# Patient Record
Sex: Female | Born: 1950 | Race: Black or African American | Hispanic: No | Smoking: Former smoker
Health system: Southern US, Community
[De-identification: ages and names within clinical notes are randomized; demographics above are authoritative.]

## PROBLEM LIST (undated history)

## (undated) DIAGNOSIS — M1990 Unspecified osteoarthritis, unspecified site: Secondary | ICD-10-CM

## (undated) DIAGNOSIS — K219 Gastro-esophageal reflux disease without esophagitis: Secondary | ICD-10-CM

## (undated) DIAGNOSIS — R42 Dizziness and giddiness: Secondary | ICD-10-CM

## (undated) DIAGNOSIS — F419 Anxiety disorder, unspecified: Secondary | ICD-10-CM

## (undated) DIAGNOSIS — N12 Tubulo-interstitial nephritis, not specified as acute or chronic: Secondary | ICD-10-CM

## (undated) DIAGNOSIS — R011 Cardiac murmur, unspecified: Secondary | ICD-10-CM

## (undated) DIAGNOSIS — Z8489 Family history of other specified conditions: Secondary | ICD-10-CM

## (undated) DIAGNOSIS — I739 Peripheral vascular disease, unspecified: Secondary | ICD-10-CM

## (undated) DIAGNOSIS — I10 Essential (primary) hypertension: Secondary | ICD-10-CM

## (undated) DIAGNOSIS — Z720 Tobacco use: Secondary | ICD-10-CM

## (undated) DIAGNOSIS — M797 Fibromyalgia: Secondary | ICD-10-CM

## (undated) DIAGNOSIS — M791 Myalgia, unspecified site: Secondary | ICD-10-CM

## (undated) DIAGNOSIS — Z9289 Personal history of other medical treatment: Secondary | ICD-10-CM

## (undated) DIAGNOSIS — T8859XA Other complications of anesthesia, initial encounter: Secondary | ICD-10-CM

## (undated) DIAGNOSIS — D649 Anemia, unspecified: Secondary | ICD-10-CM

## (undated) DIAGNOSIS — T4145XA Adverse effect of unspecified anesthetic, initial encounter: Secondary | ICD-10-CM

## (undated) DIAGNOSIS — R7303 Prediabetes: Secondary | ICD-10-CM

## (undated) DIAGNOSIS — I471 Supraventricular tachycardia, unspecified: Secondary | ICD-10-CM

## (undated) DIAGNOSIS — G629 Polyneuropathy, unspecified: Secondary | ICD-10-CM

## (undated) DIAGNOSIS — E785 Hyperlipidemia, unspecified: Secondary | ICD-10-CM

## (undated) DIAGNOSIS — Z8719 Personal history of other diseases of the digestive system: Secondary | ICD-10-CM

## (undated) DIAGNOSIS — A159 Respiratory tuberculosis unspecified: Secondary | ICD-10-CM

## (undated) DIAGNOSIS — C801 Malignant (primary) neoplasm, unspecified: Secondary | ICD-10-CM

## (undated) DIAGNOSIS — K922 Gastrointestinal hemorrhage, unspecified: Secondary | ICD-10-CM

## (undated) HISTORY — PX: OTHER SURGICAL HISTORY: SHX169

## (undated) HISTORY — DX: Hyperlipidemia, unspecified: E78.5

## (undated) HISTORY — PX: COLONOSCOPY: SHX174

## (undated) HISTORY — DX: Essential (primary) hypertension: I10

## (undated) HISTORY — DX: Tobacco use: Z72.0

## (undated) HISTORY — PX: ECTOPIC PREGNANCY SURGERY: SHX613

## (undated) HISTORY — PX: CERVICAL SPINE SURGERY: SHX589

## (undated) HISTORY — DX: Peripheral vascular disease, unspecified: I73.9

---
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
## 1979-09-11 HISTORY — PX: TONSILECTOMY, ADENOIDECTOMY, BILATERAL MYRINGOTOMY AND TUBES: SHX2538

## 1979-09-11 HISTORY — PX: TUBAL LIGATION: SHX77

## 1989-09-10 HISTORY — PX: CERVICAL SPINE SURGERY: SHX589

## 1997-09-10 HISTORY — PX: ABDOMINAL HYSTERECTOMY: SHX81

## 2016-02-21 DIAGNOSIS — R6889 Other general symptoms and signs: Secondary | ICD-10-CM | POA: Diagnosis not present

## 2016-02-21 DIAGNOSIS — I10 Essential (primary) hypertension: Secondary | ICD-10-CM | POA: Diagnosis not present

## 2016-02-21 DIAGNOSIS — Z720 Tobacco use: Secondary | ICD-10-CM | POA: Diagnosis not present

## 2016-02-21 DIAGNOSIS — E538 Deficiency of other specified B group vitamins: Secondary | ICD-10-CM | POA: Diagnosis not present

## 2016-02-21 DIAGNOSIS — E785 Hyperlipidemia, unspecified: Secondary | ICD-10-CM | POA: Diagnosis not present

## 2016-02-21 DIAGNOSIS — D649 Anemia, unspecified: Secondary | ICD-10-CM | POA: Diagnosis not present

## 2016-02-21 DIAGNOSIS — M542 Cervicalgia: Secondary | ICD-10-CM | POA: Diagnosis not present

## 2016-03-08 DIAGNOSIS — R6889 Other general symptoms and signs: Secondary | ICD-10-CM | POA: Diagnosis not present

## 2016-03-08 DIAGNOSIS — I10 Essential (primary) hypertension: Secondary | ICD-10-CM | POA: Diagnosis not present

## 2016-05-04 ENCOUNTER — Encounter: Payer: Commercial Managed Care - HMO | Admitting: Physical Medicine & Rehabilitation

## 2016-06-01 ENCOUNTER — Encounter
Payer: Commercial Managed Care - HMO | Attending: Physical Medicine & Rehabilitation | Admitting: Physical Medicine & Rehabilitation

## 2016-06-04 DIAGNOSIS — I10 Essential (primary) hypertension: Secondary | ICD-10-CM | POA: Diagnosis not present

## 2016-06-04 DIAGNOSIS — Z23 Encounter for immunization: Secondary | ICD-10-CM | POA: Diagnosis not present

## 2016-06-04 DIAGNOSIS — Z1211 Encounter for screening for malignant neoplasm of colon: Secondary | ICD-10-CM | POA: Diagnosis not present

## 2016-06-04 DIAGNOSIS — Z807 Family history of other malignant neoplasms of lymphoid, hematopoietic and related tissues: Secondary | ICD-10-CM | POA: Diagnosis not present

## 2016-06-04 DIAGNOSIS — Z720 Tobacco use: Secondary | ICD-10-CM | POA: Diagnosis not present

## 2016-06-04 DIAGNOSIS — M79671 Pain in right foot: Secondary | ICD-10-CM | POA: Diagnosis not present

## 2016-06-04 DIAGNOSIS — E785 Hyperlipidemia, unspecified: Secondary | ICD-10-CM | POA: Diagnosis not present

## 2016-06-04 DIAGNOSIS — R6889 Other general symptoms and signs: Secondary | ICD-10-CM | POA: Diagnosis not present

## 2016-07-10 DIAGNOSIS — K5730 Diverticulosis of large intestine without perforation or abscess without bleeding: Secondary | ICD-10-CM | POA: Diagnosis not present

## 2016-07-10 DIAGNOSIS — K640 First degree hemorrhoids: Secondary | ICD-10-CM | POA: Diagnosis not present

## 2016-07-10 DIAGNOSIS — Z1211 Encounter for screening for malignant neoplasm of colon: Secondary | ICD-10-CM | POA: Diagnosis not present

## 2016-08-21 DIAGNOSIS — M79606 Pain in leg, unspecified: Secondary | ICD-10-CM | POA: Diagnosis not present

## 2016-08-21 DIAGNOSIS — R208 Other disturbances of skin sensation: Secondary | ICD-10-CM | POA: Diagnosis not present

## 2016-08-21 DIAGNOSIS — R3129 Other microscopic hematuria: Secondary | ICD-10-CM | POA: Diagnosis not present

## 2016-08-21 DIAGNOSIS — E538 Deficiency of other specified B group vitamins: Secondary | ICD-10-CM | POA: Diagnosis not present

## 2016-08-21 DIAGNOSIS — I10 Essential (primary) hypertension: Secondary | ICD-10-CM | POA: Diagnosis not present

## 2016-08-21 DIAGNOSIS — Z720 Tobacco use: Secondary | ICD-10-CM | POA: Diagnosis not present

## 2016-08-23 ENCOUNTER — Other Ambulatory Visit: Payer: Self-pay | Admitting: Family Medicine

## 2016-08-23 DIAGNOSIS — M79605 Pain in left leg: Principal | ICD-10-CM

## 2016-08-23 DIAGNOSIS — M79604 Pain in right leg: Secondary | ICD-10-CM

## 2016-10-04 DIAGNOSIS — R6889 Other general symptoms and signs: Secondary | ICD-10-CM | POA: Diagnosis not present

## 2016-10-04 DIAGNOSIS — R3121 Asymptomatic microscopic hematuria: Secondary | ICD-10-CM | POA: Diagnosis not present

## 2016-10-04 DIAGNOSIS — R350 Frequency of micturition: Secondary | ICD-10-CM | POA: Diagnosis not present

## 2016-10-11 ENCOUNTER — Encounter: Payer: Self-pay | Admitting: Physical Medicine & Rehabilitation

## 2016-10-11 ENCOUNTER — Encounter: Payer: Medicare HMO | Attending: Physical Medicine & Rehabilitation | Admitting: Physical Medicine & Rehabilitation

## 2016-10-11 VITALS — BP 168/86 | HR 100

## 2016-10-11 DIAGNOSIS — R200 Anesthesia of skin: Secondary | ICD-10-CM | POA: Diagnosis not present

## 2016-10-11 DIAGNOSIS — E538 Deficiency of other specified B group vitamins: Secondary | ICD-10-CM | POA: Insufficient documentation

## 2016-10-11 DIAGNOSIS — I10 Essential (primary) hypertension: Secondary | ICD-10-CM

## 2016-10-11 DIAGNOSIS — M25511 Pain in right shoulder: Secondary | ICD-10-CM | POA: Diagnosis not present

## 2016-10-11 DIAGNOSIS — G8929 Other chronic pain: Secondary | ICD-10-CM | POA: Diagnosis not present

## 2016-10-11 DIAGNOSIS — M542 Cervicalgia: Secondary | ICD-10-CM | POA: Diagnosis not present

## 2016-10-11 DIAGNOSIS — F17210 Nicotine dependence, cigarettes, uncomplicated: Secondary | ICD-10-CM | POA: Insufficient documentation

## 2016-10-11 DIAGNOSIS — Z79899 Other long term (current) drug therapy: Secondary | ICD-10-CM

## 2016-10-11 DIAGNOSIS — D649 Anemia, unspecified: Secondary | ICD-10-CM | POA: Insufficient documentation

## 2016-10-11 DIAGNOSIS — M4802 Spinal stenosis, cervical region: Secondary | ICD-10-CM | POA: Insufficient documentation

## 2016-10-11 DIAGNOSIS — M791 Myalgia, unspecified site: Secondary | ICD-10-CM | POA: Insufficient documentation

## 2016-10-11 DIAGNOSIS — R202 Paresthesia of skin: Secondary | ICD-10-CM | POA: Diagnosis not present

## 2016-10-11 DIAGNOSIS — R6889 Other general symptoms and signs: Secondary | ICD-10-CM | POA: Diagnosis not present

## 2016-10-11 DIAGNOSIS — Z5181 Encounter for therapeutic drug level monitoring: Secondary | ICD-10-CM | POA: Diagnosis not present

## 2016-10-11 DIAGNOSIS — G894 Chronic pain syndrome: Secondary | ICD-10-CM | POA: Insufficient documentation

## 2016-10-11 DIAGNOSIS — Z981 Arthrodesis status: Secondary | ICD-10-CM | POA: Insufficient documentation

## 2016-10-11 MED ORDER — MELOXICAM 15 MG PO TABS: 15.0000 mg | ORAL_TABLET | Freq: Every day | ORAL | 1 refills | Status: DC

## 2016-10-11 MED ORDER — BACLOFEN 10 MG PO TABS: 10.0000 mg | ORAL_TABLET | Freq: Three times a day (TID) | ORAL | 1 refills | Status: AC

## 2016-10-11 MED ORDER — GABAPENTIN 100 MG PO CAPS: 100.0000 mg | ORAL_CAPSULE | Freq: Three times a day (TID) | ORAL | 1 refills | Status: DC

## 2016-10-11 NOTE — Addendum Note (Signed)
Addended by: Marland Mcalpine B on: 10/11/2016 12:22 PM   Modules accepted: Orders

## 2016-10-11 NOTE — Progress Notes (Addendum)
Subjective:    Patient ID: Amy Bruce, female    DOB: 1951-04-10, 66 y.o.   MRN: [id number]  HPI 66 y/o female with pmh/psh of  HTN, anemia, B12 deficiency, rheumatic fever, cervical fusion present for right shoulder pain.  Present since fusion 1991, getting worse in last couple weeks.  She fractured her humerus in 2016 and states she was slinged.  No alleviating factors. Everything exacerbates the pain.  Sharp pain.  Radiates to hands.  She has associated numbness.  Ibu does not help. Narco, flexaril helps. Denies falls. Pain limits from doing ADLs. Pt notes she had similar symptoms on left side before first fusion.  Pt relocated 12/2014 from [place], [place] where she was being followed by Pain clinic.    Pain Inventory Average Pain 8 Pain Right Now 8 My pain is burning, tingling and aching  In the last 24 hours, has pain interfered with the following? General activity 7 Relation with others 7 Enjoyment of life 7 What TIME of day is your pain at its worst? all Sleep (in general) Good  Pain is worse with: sitting and standing Pain improves with: medication Relief from Meds: 6  Mobility walk without assistance ability to climb steps?  yes do you drive?  yes  Function retired  Neuro/Psych numbness tingling  Prior Studies x-rays  Physicians involved in your care Primary care dr Orland Mustard   No pertinent family history of neck/shoulder pain. Social History   Social History  . Marital status: Unknown    Spouse name: N/A  . Number of children: N/A  . Years of education: N/A   Social History Main Topics  . Smoking status: Current Some Day Smoker    Packs/day: 0.50    Types: Cigarettes  . Smokeless tobacco: Never Used  . Alcohol use Yes     Comment: 1-2 wk  . Drug use: No  . Sexual activity: Not Asked   Other Topics Concern  . None   Social History Narrative  . None   Past surgical history: Cervical fusion. Past medical history: HTN, anemia, B12  deficiency, rheumatic fever, chronic pain BP (!) 168/86   Pulse 100   SpO2 98%   Opioid Risk Score:   Fall Risk Score:  `1  Depression screen PHQ 2/9  Depression screen PHQ 2/9 10/11/2016  Decreased Interest 0  Down, Depressed, Hopeless 0  PHQ - 2 Score 0  Altered sleeping 0  Tired, decreased energy 0  Change in appetite 0  Feeling bad or failure about yourself  0  Trouble concentrating 0  Moving slowly or fidgety/restless 0  Suicidal thoughts 0  PHQ-9 Score 0  Difficult doing work/chores Not difficult at all   Review of Systems  Constitutional: Negative.   HENT: Negative.   Eyes: Negative.   Respiratory: Negative.   Cardiovascular: Negative.   Gastrointestinal: Negative.   Endocrine: Negative.   Genitourinary: Negative.   Musculoskeletal: Positive for arthralgias, back pain, myalgias and neck pain.  Skin: Negative.   Allergic/Immunologic: Negative.   Neurological: Positive for numbness.  Hematological: Negative.   Psychiatric/Behavioral: Negative.   All other systems reviewed and are negative.     Objective:   Physical Exam Gen: NAD. Vital signs reviewed HENT: Normocephalic, Atraumatic Eyes: EOMI. No discharge.  Cardio: RRR. No JVD. Pulm: B/l clear to auscultation.  Effort normal Abd: Soft, BS+ MSK:  Gait WNL.   TTP along right traps, and right shoulder.    No edema.   ?+empty can test, limited  by pain.  Neg Speed's, Lindon Romp, Scarf Neuro:   Sensation diminished to light touch in hand b/l R>L  Strength  5/5 in all LE myotomes (Right side limited by pain) Skin: Warm and Dry. Intact    Assessment & Plan:  66 y/o female with pmh/psh of  HTN, anemia, B12 deficiency, rheumatic fever, cervical fusion present for right shoulder pain.    1. Chronic right shoulder and neck pain   Xrays reports reviewed from [place], C3, C6 foraminal stenosis and fusion  Pt to obtain additional imaging of shoulder  Labs reviewed  Referral information reviewed  NCCSRS  reviewed  UDS performed  Heat/Cold ineffective  TENS with benefit, will order  Will order Gabapentin 100 TID   Will order Mobic 15mg  daily with food  Trial Baclofen 10 TID  Will consider Cymbalta in future  Will consider Voltaren gel and Lidoderm patch in future  Will consider PT in future with consideration of traction  Will order NCS/EMG   2. Myalgia   Will consider Trigger point injections in future  3. Right hand numbness  See #1  ?CTS  4. HTN  Elevated in office, encouraged follow up with PCP.  Pt states meds recently adjusted

## 2016-10-17 LAB — TOXASSURE SELECT,+ANTIDEPR,UR

## 2016-10-22 ENCOUNTER — Telehealth: Payer: Self-pay

## 2016-10-22 NOTE — Telephone Encounter (Signed)
Called patient back, states called insurance company and they gave her a list of durable medical pharmacy's that will fill the order for the tens unit, but needs a written prescription from the doctor and his last note with the reason for the tens unit faxed to the durable pharmacy "[facility]"  Fax# [phone]

## 2016-10-22 NOTE — Telephone Encounter (Signed)
Patient called with problem with unknown medication, Returned patients call, no answer, left message to call back

## 2016-10-23 NOTE — Telephone Encounter (Signed)
Faxed order and note to supplied fax #

## 2016-11-02 NOTE — Progress Notes (Signed)
Urine drug screen for this encounter is consistent for having no narcotics present.

## 2016-11-08 ENCOUNTER — Encounter: Payer: Medicare HMO | Attending: Physical Medicine & Rehabilitation | Admitting: Physical Medicine & Rehabilitation

## 2016-11-08 ENCOUNTER — Encounter: Payer: Self-pay | Admitting: Physical Medicine & Rehabilitation

## 2016-11-08 VITALS — BP 177/75 | HR 76 | Resp 14

## 2016-11-08 DIAGNOSIS — M4802 Spinal stenosis, cervical region: Secondary | ICD-10-CM | POA: Insufficient documentation

## 2016-11-08 DIAGNOSIS — E538 Deficiency of other specified B group vitamins: Secondary | ICD-10-CM | POA: Diagnosis not present

## 2016-11-08 DIAGNOSIS — I10 Essential (primary) hypertension: Secondary | ICD-10-CM | POA: Diagnosis not present

## 2016-11-08 DIAGNOSIS — F17210 Nicotine dependence, cigarettes, uncomplicated: Secondary | ICD-10-CM | POA: Diagnosis not present

## 2016-11-08 DIAGNOSIS — Z981 Arthrodesis status: Secondary | ICD-10-CM | POA: Insufficient documentation

## 2016-11-08 DIAGNOSIS — M542 Cervicalgia: Secondary | ICD-10-CM | POA: Diagnosis not present

## 2016-11-08 DIAGNOSIS — G8929 Other chronic pain: Secondary | ICD-10-CM | POA: Diagnosis not present

## 2016-11-08 DIAGNOSIS — D649 Anemia, unspecified: Secondary | ICD-10-CM | POA: Diagnosis not present

## 2016-11-08 DIAGNOSIS — M25511 Pain in right shoulder: Secondary | ICD-10-CM | POA: Insufficient documentation

## 2016-11-08 MED ORDER — GABAPENTIN 100 MG PO CAPS: 300.0000 mg | ORAL_CAPSULE | Freq: Three times a day (TID) | ORAL | 1 refills | Status: DC

## 2016-11-08 NOTE — Progress Notes (Signed)
See Media tab  Plan: Gabapentin increased to 300 TID Follow up in 2 weeks

## 2016-11-21 DIAGNOSIS — R635 Abnormal weight gain: Secondary | ICD-10-CM | POA: Diagnosis not present

## 2016-11-21 DIAGNOSIS — I10 Essential (primary) hypertension: Secondary | ICD-10-CM | POA: Diagnosis not present

## 2016-11-21 DIAGNOSIS — Z23 Encounter for immunization: Secondary | ICD-10-CM | POA: Diagnosis not present

## 2016-11-21 DIAGNOSIS — R05 Cough: Secondary | ICD-10-CM | POA: Diagnosis not present

## 2016-11-21 DIAGNOSIS — R7309 Other abnormal glucose: Secondary | ICD-10-CM | POA: Diagnosis not present

## 2016-11-21 DIAGNOSIS — E785 Hyperlipidemia, unspecified: Secondary | ICD-10-CM | POA: Diagnosis not present

## 2016-11-21 DIAGNOSIS — R062 Wheezing: Secondary | ICD-10-CM | POA: Diagnosis not present

## 2016-11-21 DIAGNOSIS — Z0000 Encounter for general adult medical examination without abnormal findings: Secondary | ICD-10-CM | POA: Diagnosis not present

## 2016-11-21 DIAGNOSIS — Z720 Tobacco use: Secondary | ICD-10-CM | POA: Diagnosis not present

## 2016-11-23 ENCOUNTER — Encounter: Payer: Medicare HMO | Admitting: Physical Medicine & Rehabilitation

## 2016-11-23 ENCOUNTER — Other Ambulatory Visit: Payer: Self-pay | Admitting: Family Medicine

## 2016-11-23 DIAGNOSIS — N6324 Unspecified lump in the left breast, lower inner quadrant: Secondary | ICD-10-CM

## 2016-11-30 ENCOUNTER — Other Ambulatory Visit: Payer: Self-pay | Admitting: Physical Medicine & Rehabilitation

## 2016-11-30 ENCOUNTER — Telehealth: Payer: Self-pay | Admitting: Physical Medicine & Rehabilitation

## 2016-11-30 NOTE — Telephone Encounter (Signed)
Patient leftr voice mail she needs her gabapentin refille for 90 days [phone]

## 2016-12-03 ENCOUNTER — Other Ambulatory Visit: Payer: Self-pay

## 2016-12-03 NOTE — Telephone Encounter (Signed)
Patient asking for 90 day supplies of gabapentin and baclofen.Marland KitchenMarland KitchenMarland KitchenMarland Kitchenplease advise

## 2016-12-04 ENCOUNTER — Other Ambulatory Visit: Payer: Self-pay

## 2016-12-04 MED ORDER — GABAPENTIN 100 MG PO CAPS: 300.0000 mg | ORAL_CAPSULE | Freq: Three times a day (TID) | ORAL | 1 refills | Status: DC

## 2016-12-10 NOTE — Telephone Encounter (Signed)
I believe I see her in a couple of days.  I will discuss it with her then.  Thanks.

## 2016-12-12 ENCOUNTER — Encounter: Payer: Medicare HMO | Attending: Physical Medicine & Rehabilitation | Admitting: Physical Medicine & Rehabilitation

## 2016-12-12 ENCOUNTER — Other Ambulatory Visit: Payer: Self-pay | Admitting: Physical Medicine & Rehabilitation

## 2016-12-12 ENCOUNTER — Encounter: Payer: Self-pay | Admitting: Physical Medicine & Rehabilitation

## 2016-12-12 VITALS — BP 158/90 | HR 69 | Wt 155.8 lb

## 2016-12-12 DIAGNOSIS — M542 Cervicalgia: Secondary | ICD-10-CM | POA: Diagnosis not present

## 2016-12-12 DIAGNOSIS — G8929 Other chronic pain: Secondary | ICD-10-CM | POA: Diagnosis not present

## 2016-12-12 DIAGNOSIS — M25511 Pain in right shoulder: Secondary | ICD-10-CM | POA: Diagnosis not present

## 2016-12-12 DIAGNOSIS — G894 Chronic pain syndrome: Secondary | ICD-10-CM

## 2016-12-12 DIAGNOSIS — E538 Deficiency of other specified B group vitamins: Secondary | ICD-10-CM | POA: Insufficient documentation

## 2016-12-12 DIAGNOSIS — M4802 Spinal stenosis, cervical region: Secondary | ICD-10-CM | POA: Insufficient documentation

## 2016-12-12 DIAGNOSIS — R200 Anesthesia of skin: Secondary | ICD-10-CM | POA: Diagnosis not present

## 2016-12-12 DIAGNOSIS — D649 Anemia, unspecified: Secondary | ICD-10-CM | POA: Diagnosis not present

## 2016-12-12 DIAGNOSIS — F17210 Nicotine dependence, cigarettes, uncomplicated: Secondary | ICD-10-CM | POA: Insufficient documentation

## 2016-12-12 DIAGNOSIS — M791 Myalgia, unspecified site: Secondary | ICD-10-CM

## 2016-12-12 DIAGNOSIS — R202 Paresthesia of skin: Secondary | ICD-10-CM

## 2016-12-12 DIAGNOSIS — I10 Essential (primary) hypertension: Secondary | ICD-10-CM | POA: Insufficient documentation

## 2016-12-12 DIAGNOSIS — Z981 Arthrodesis status: Secondary | ICD-10-CM | POA: Insufficient documentation

## 2016-12-12 MED ORDER — BACLOFEN 20 MG PO TABS: 20.0000 mg | ORAL_TABLET | Freq: Three times a day (TID) | ORAL | 1 refills | Status: DC

## 2016-12-12 NOTE — Progress Notes (Signed)
Subjective:    Patient ID: Amy Bruce, female    DOB: 12/24/1950, 66 y.o.   MRN: [id number]  HPI  67 y/o female with pmh/psh ofHTN, anemia, B12 deficiency, rheumatic fever, cervical fusion present for follow up for right shoulder pain.   Initially stated: Present since fusion 1991, getting worse in last couple weeks.  She fractured her humerus in 2016 and states she was slinged.  No alleviating factors. Everything exacerbates the pain.  Sharp pain.  Radiates to hands.  She has associated numbness.  Ibu does not help. Narco, flexaril helps. Denies falls. Pain limits from doing ADLs. Pt notes she had similar symptoms on left side before first fusion. Pt relocated 12/2014 from [place], [place] where she was being followed by Pain clinic.    Last clinic visit 10/11/16. Since that time, pt states further imaging results was sent to our office.  Pt has obtained TENS with benefit.  Gabapentin is helping. Mobic is helping.  Baclofen is not helping. NCS/EMG was performed on last visit showing subacute/chronic C5 radiculopathy.  BP remains elevated in office, she states she is following with PCP.   Pain Inventory Average Pain 8 Pain Right Now 8 My pain is sharp, burning, stabbing, tingling and aching  In the last 24 hours, has pain interfered with the following? General activity 7 Relation with others 0 Enjoyment of life 7 What TIME of day is your pain at its worst? all Sleep (in general) Fair  Pain is worse with: sitting and some activites Pain improves with: medication and TENS Relief from Meds: 5  Mobility walk without assistance how many minutes can you walk? 120 ability to climb steps?  yes do you drive?  yes  Function retired  Neuro/Psych weakness numbness tingling spasms  Prior Studies Any changes since last visit?  no  Physicians involved in your care Primary care dr Orland Mustard   No pertinent family history of neck/shoulder pain. Social History   Social History  .  Marital status: Unknown    Spouse name: N/A  . Number of children: N/A  . Years of education: N/A   Social History Main Topics  . Smoking status: Current Some Day Smoker    Packs/day: 0.50    Types: Cigarettes  . Smokeless tobacco: Never Used  . Alcohol use Yes     Comment: 1-2 wk  . Drug use: No  . Sexual activity: Not Asked   Other Topics Concern  . None   Social History Narrative  . None   Past surgical history: Cervical fusion. Past medical history: HTN, anemia, B12 deficiency, rheumatic fever, chronic pain BP (!) 158/90   Pulse 69   Wt 155 lb 12.8 oz (70.7 kg)   SpO2 95%   Opioid Risk Score:   Fall Risk Score:  `1  Depression screen PHQ 2/9  Depression screen [facility] 2/9 12/12/2016 11/08/2016 10/11/2016  Decreased Interest 0 0 0  Down, Depressed, Hopeless 0 0 0  PHQ - 2 Score 0 0 0  Altered sleeping - - 0  Tired, decreased energy - - 0  Change in appetite - - 0  Feeling bad or failure about yourself  - - 0  Trouble concentrating - - 0  Moving slowly or fidgety/restless - - 0  Suicidal thoughts - - 0  PHQ-9 Score - - 0  Difficult doing work/chores - - Not difficult at all   Review of Systems  Constitutional: Positive for unexpected weight change.  Weight gain in relatively short amount of time  HENT: Negative.   Eyes: Negative.   Respiratory: Negative.   Cardiovascular: Negative.   Gastrointestinal: Negative.   Endocrine: Negative.   Genitourinary: Negative.   Musculoskeletal: Positive for arthralgias, back pain, myalgias and neck pain.  Skin: Negative.   Allergic/Immunologic: Negative.   Neurological: Positive for numbness.  Hematological: Negative.   Psychiatric/Behavioral: Negative.   All other systems reviewed and are negative.     Objective:   Physical Exam Gen: NAD. Vital signs reviewed HENT: Normocephalic, Atraumatic Eyes: EOMI. No discharge.  Cardio: RRR. No JVD. Pulm: B/l clear to auscultation.  Effort normal Abd: Soft, BS+ MSK:  Gait  WNL.   TTP along right traps, and right shoulder.    No edema.   ?+empty can test, limited by pain. Neuro:   Sensation diminished to light touch in hand b/l R>L  Strength  5/5 in all LE myotomes (Right side limited by pain) Skin: Warm and Dry. Intact    Assessment & Plan:  67 y/o female with pmh/psh of  HTN, anemia, B12 deficiency, rheumatic fever, cervical fusion present for follow up of right shoulder pain.    1. Chronic right shoulder and neck pain   Xrays reports reviewed from [facility], C3, C6 foraminal stenosis and fusion  NCS/EMG showing subacute/chronic C5 radic  Pt to bring additional imaging of shoulder  UDS reviewed.  Heat/Cold ineffective  Cont TENS  Will cont Gabapentin 300 TID (increased on last visit, pt with some concern of weight gain)  Cont Mobic 15mg  daily with food  Will increase Baclofen to 20 TID  Will order PT with consideration for traction  Will consider Cymbalta in future  Will consider Voltaren gel and Lidoderm patch in future   2. Myalgia   Will consider Trigger point injections in future  3. Right hand numbness  Possible double crush  See #1  Possible CTS, however improving  Will order brace  4. HTN  Elevated in office again, encouraged follow up with PCP.

## 2016-12-12 NOTE — Telephone Encounter (Signed)
Recieved electronic refill request for baclofen, saw it was just prescribed and in creased to 20mg , is this a trial medication or can it be written for a 90 day supply, please advise

## 2016-12-18 ENCOUNTER — Encounter: Payer: Self-pay | Admitting: Physical Therapy

## 2016-12-18 ENCOUNTER — Ambulatory Visit: Payer: Medicare HMO | Attending: Physical Medicine & Rehabilitation | Admitting: Physical Therapy

## 2016-12-18 DIAGNOSIS — M542 Cervicalgia: Secondary | ICD-10-CM | POA: Diagnosis not present

## 2016-12-18 DIAGNOSIS — M79601 Pain in right arm: Secondary | ICD-10-CM | POA: Diagnosis not present

## 2016-12-18 DIAGNOSIS — M25511 Pain in right shoulder: Secondary | ICD-10-CM | POA: Diagnosis not present

## 2016-12-18 DIAGNOSIS — G8929 Other chronic pain: Secondary | ICD-10-CM | POA: Diagnosis not present

## 2016-12-18 DIAGNOSIS — R6889 Other general symptoms and signs: Secondary | ICD-10-CM | POA: Diagnosis not present

## 2016-12-18 NOTE — Therapy (Signed)
[place], [place], [address] Phone: [phone]   Fax:  [phone]  Physical Therapy Evaluation  Patient Details  Name: Amy Bruce MRN: [id number] Date of Birth: [birth year] Referring Provider: Jamse Arn, MD  Encounter Date: 12/18/2016      PT End of Session - 12/18/16 0853    Visit Number 1   Number of Visits 13   Date for PT Re-Evaluation 02/01/17   Authorization Type [employer] M[employer] KX at visit 15   PT Start Time 0848   PT Stop Time 0938   PT Time Calculation (min) 50 min   Activity Tolerance Patient tolerated treatment well   Behavior During Therapy [facility] for tasks assessed/performed      No past medical history on file.  Past Surgical History:  Procedure Laterality Date  . ABDOMINAL HYSTERECTOMY  1999  . [place]   C5/6 6/7  . Left axillary cyst removal 1989    . TONSILECTOMY, ADENOIDECTOMY, BILATERAL MYRINGOTOMY AND TUBES  1981  . TUBAL LIGATION  1981    There were no vitals filed for this visit.       Subjective Assessment - 12/18/16 0859    Subjective Began having neck/shoulder pain of insidous onset in 2014, reports diagnosis of degenerative disk disease. Reports pain management in [place], [place] told her C3-4 disk is herniated and T6-9 are degenerated and have excruciating pain. Worst part of all of it is the muscle spasms. Occasional HA pain. Reports fall in 2015 which she landed on her R shoulder and fractured her humeral head.    How long can you sit comfortably? unlimited   How long can you stand comfortably? unlimited   How long can you walk comfortably? unlimited   Patient Stated Goals fixing hair/using hair tools, lift R arm to put on deoderant, write using R arm(has to adjust pen/pencil), reduce R upper arm/forearm burning, bowl, crochet (able now for short time), use computer mouse   Currently in Pain? Yes   Pain Score 7    Pain Location Shoulder    Pain Orientation Right   Pain Descriptors / Indicators Burning;Tightness;Discomfort  annoying   Pain Type Chronic pain   Pain Radiating Towards R lateral neck and R lateral arm and dorsal forearm   Pain Frequency Constant   Aggravating Factors  any use of arm   Pain Relieving Factors none            [facility] PT Assessment - 12/18/16 0001      Assessment   Medical Diagnosis cervicalgia, R arm pain, myalgia   Referring Provider Jamse Arn, MD   Hand Dominance Right   Next MD Visit 01/11/17   Prior Therapy no     Precautions   Precautions None   Precaution Comments anxiety/panic attacks     Restrictions   Weight Bearing Restrictions No     Balance Screen   Has the patient fallen in the past 6 months No     [place] residence   Living Arrangements Alone   Additional Comments emergency stairs only     Prior Function   Level of Independence Independent     Cognition   Overall Cognitive Status Within Functional Limits for tasks assessed     Observation/Other Assessments   Focus on Therapeutic Outcomes (FOTO)  48% ability (goal 60%)     Sensation   Additional Comments tingling in bilateral hands and R  lateral upper arm     Posture/Postural Control   Posture Comments flat thoracic spine     ROM / Strength   AROM / PROM / Strength AROM;Strength     AROM   Overall AROM Comments PROM WFL   AROM Assessment Site Shoulder   Right/Left Shoulder Right   Right Shoulder Flexion 70 Degrees   Right Shoulder ABduction 74 Degrees     Strength   Strength Assessment Site Shoulder;Hand   Right/Left Shoulder Right;Left   Right Shoulder Flexion 3-/5  unable to move through avail ROM against gravity   Right Shoulder External Rotation 3+/5   Left Shoulder External Rotation 4+/5   Left Shoulder Horizontal ABduction 5/5   Right/Left hand Right;Left   Right Hand Grip (lbs) 40 40 40   Left Hand Grip (lbs) 40 45 43     Palpation    Palpation comment pt jumped at very light touch to periscapular region                   [facility] Adult PT Treatment/Exercise - 12/18/16 0001      Exercises   Exercises Shoulder     Shoulder Exercises: Standing   Other Standing Exercises scapular retraction for resting postural alignment     Shoulder Exercises: Stretch   Table Stretch -Flexion Limitations bilateral flexion stretch on table     Manual Therapy   Manual therapy comments edu on use of tennis ball at wall                PT Education - 12/18/16 1304    Education provided Yes   Education Details anatomy of condition, POC, HEP, exercise form/rationale, desensitization   Person(s) Educated Patient   Methods Explanation;Demonstration;Tactile cues;Verbal cues;Handout   Comprehension Verbalized understanding;Returned demonstration;Verbal cues required;Tactile cues required;Need further instruction          PT Short Term Goals - 12/18/16 1312      PT SHORT TERM GOAL #1   Title pt will demo at least 10 deg increase in GHJ ROM by 5/4   Baseline see flowsheet   Time 3   Period Weeks   Status New     PT SHORT TERM GOAL #2   Title Pt will be able to hold her arm up to don deoderant without help of L arm   Baseline unable at eval   Time 3   Period Weeks   Status New           PT Long Term Goals - 12/18/16 1314      PT LONG TERM GOAL #1   Title Pt will be able to write appropraitely using R arm without limitation by 5/25   Baseline limited by grip ability as well as positional intolerance at eval   Time 6   Period Weeks   Status New     PT LONG TERM GOAL #2   Title Pt will be able to use her hair tools and 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 5/7 days of the week   Baseline rare that she is able to at eval   Time 6   Period Weeks   Status New     PT LONG TERM GOAL #3   Title average pain <=4/10 as well as 75% reduction in referred pain into R arm   Baseline 7/10 at eval with constant burning/tingling lateral  arm and dorsal forearm   Time 6   Period Weeks   Status New     PT  LONG TERM GOAL #4   Title FOTO to 60% ability to indicate significant improvement in functional ability   Baseline 48% at eval   Time 6   Period Weeks   Status New               Plan - 12/18/16 1305    Clinical Impression Statement pt presents to PT with complaints of R-sided cervical, shoulder and arm pain that is constant. Pt reports C5/6 and 6/7 fusion in the past as well as a fall on her R shoulder a couple of years ago. Pt has good passive movement in GHJ and WFL cervical AROM. Signifiant limitation in GHJ AROM. Pain patterns presented by the patient are consistent with trigger point referral patterns rather than nerve distribution. Pt was unable to tolerate touch for soft tissue work so I gave her a tennis ball to begin providing pressure at home to decrease sensitization. Pt will benefit from skilled PT in order to improve functional use of GHJ and reach long term functional goals.    Rehab Potential Good   PT Frequency 2x / week   PT Duration 6 weeks   PT Treatment/Interventions ADLs/Self Care Home Management;Cryotherapy;Electrical Stimulation;Iontophoresis 4mg /ml Dexamethasone;Functional mobility training;Ultrasound;Moist Heat;Therapeutic activities;Therapeutic exercise;Traction;Neuromuscular re-education;Patient/family education;Passive range of motion;Manual techniques;Dry needling;Taping   PT Next Visit Plan manual cervical traction, UBE, supine wand   PT Home Exercise Plan scapular retraction, table flexion stretch, tennis ball wall for periscap region   Consulted and Agree with Plan of Care Patient      Patient will benefit from skilled therapeutic intervention in order to improve the following deficits and impairments:  Decreased range of motion, Impaired UE functional use, Increased muscle spasms, Decreased activity tolerance, Pain, Improper body mechanics, Impaired flexibility, Decreased strength,  Postural dysfunction  Visit Diagnosis: Cervicalgia - Plan: PT plan of care cert/re-cert  Chronic right shoulder pain - Plan: PT plan of care cert/re-cert  Pain in right arm - Plan: PT plan of care cert/re-cert      G-Codes - 16/10/96 1317    Functional Assessment Tool Used (Outpatient Only) FOTO 48% ability (goal 60%), GHJ AROM, clinical judgement   Functional Limitation Other PT primary   Other PT Primary Current Status (E4540) At least 60 percent but less than 80 percent impaired, limited or restricted   Other PT Primary Goal Status (J8119) At least 20 percent but less than 40 percent impaired, limited or restricted       Problem List Patient Active Problem List   Diagnosis Date Noted  . Chronic pain syndrome 10/11/2016  . Right shoulder pain 10/11/2016  . Neck pain, chronic 10/11/2016  . Numbness and tingling in right hand 10/11/2016  . Myalgia 10/11/2016    Malone Vanblarcom C. Racine Erby PT, DPT 12/18/16 1:20 PM   [facility] [address] [place], [place], [address] Phone: [phone]   Fax:  [phone]  Name: Amy Bruce MRN: [id number] Date of Birth: [birth year]

## 2016-12-18 NOTE — Patient Instructions (Signed)
  Tennis ball at wall

## 2016-12-24 ENCOUNTER — Telehealth: Payer: Self-pay | Admitting: Acute Care

## 2016-12-24 DIAGNOSIS — F17210 Nicotine dependence, cigarettes, uncomplicated: Principal | ICD-10-CM

## 2016-12-25 ENCOUNTER — Ambulatory Visit: Payer: Medicare HMO | Admitting: Physical Therapy

## 2016-12-25 NOTE — Telephone Encounter (Signed)
Will forward to the lung nodule pool 

## 2016-12-28 ENCOUNTER — Ambulatory Visit: Payer: Medicare HMO | Admitting: Physical Therapy

## 2016-12-28 DIAGNOSIS — R6889 Other general symptoms and signs: Secondary | ICD-10-CM | POA: Diagnosis not present

## 2016-12-28 DIAGNOSIS — R200 Anesthesia of skin: Secondary | ICD-10-CM | POA: Diagnosis not present

## 2016-12-28 DIAGNOSIS — R202 Paresthesia of skin: Secondary | ICD-10-CM | POA: Diagnosis not present

## 2017-01-01 ENCOUNTER — Ambulatory Visit: Payer: Medicare HMO | Admitting: Physical Therapy

## 2017-01-01 NOTE — Telephone Encounter (Signed)
Routing to lung cancer screening pool.  

## 2017-01-01 NOTE — Telephone Encounter (Signed)
Pt calling again for the lung screening appoint please advise  Pt can be reache @ [phone].Amy Bruce'

## 2017-01-02 NOTE — Telephone Encounter (Signed)
Spoke with pt and scheduled for [facility] 01/16/17 10:00 CT ordered' Nothing further needed

## 2017-01-02 NOTE — Telephone Encounter (Signed)
LMTC x 1  

## 2017-01-03 ENCOUNTER — Ambulatory Visit: Payer: Medicare HMO | Admitting: Physical Therapy

## 2017-01-07 DIAGNOSIS — Z780 Asymptomatic menopausal state: Secondary | ICD-10-CM | POA: Diagnosis not present

## 2017-01-08 ENCOUNTER — Ambulatory Visit: Payer: Medicare HMO | Attending: Physical Medicine & Rehabilitation | Admitting: Physical Therapy

## 2017-01-08 DIAGNOSIS — M79601 Pain in right arm: Secondary | ICD-10-CM | POA: Insufficient documentation

## 2017-01-08 DIAGNOSIS — M542 Cervicalgia: Secondary | ICD-10-CM | POA: Insufficient documentation

## 2017-01-08 DIAGNOSIS — M25511 Pain in right shoulder: Secondary | ICD-10-CM | POA: Insufficient documentation

## 2017-01-08 DIAGNOSIS — G8929 Other chronic pain: Secondary | ICD-10-CM | POA: Insufficient documentation

## 2017-01-09 ENCOUNTER — Encounter: Payer: Self-pay | Admitting: Physical Medicine & Rehabilitation

## 2017-01-09 ENCOUNTER — Other Ambulatory Visit: Payer: Self-pay | Admitting: Physical Medicine & Rehabilitation

## 2017-01-09 ENCOUNTER — Encounter: Payer: Medicare HMO | Attending: Physical Medicine & Rehabilitation | Admitting: Physical Medicine & Rehabilitation

## 2017-01-09 VITALS — BP 168/83 | HR 74 | Resp 16 | Wt 153.0 lb

## 2017-01-09 DIAGNOSIS — G5601 Carpal tunnel syndrome, right upper limb: Secondary | ICD-10-CM

## 2017-01-09 DIAGNOSIS — E538 Deficiency of other specified B group vitamins: Secondary | ICD-10-CM | POA: Diagnosis not present

## 2017-01-09 DIAGNOSIS — D649 Anemia, unspecified: Secondary | ICD-10-CM | POA: Insufficient documentation

## 2017-01-09 DIAGNOSIS — F17210 Nicotine dependence, cigarettes, uncomplicated: Secondary | ICD-10-CM | POA: Diagnosis not present

## 2017-01-09 DIAGNOSIS — M791 Myalgia, unspecified site: Secondary | ICD-10-CM

## 2017-01-09 DIAGNOSIS — I10 Essential (primary) hypertension: Secondary | ICD-10-CM

## 2017-01-09 DIAGNOSIS — G8929 Other chronic pain: Secondary | ICD-10-CM | POA: Diagnosis not present

## 2017-01-09 DIAGNOSIS — M542 Cervicalgia: Secondary | ICD-10-CM

## 2017-01-09 DIAGNOSIS — M5412 Radiculopathy, cervical region: Secondary | ICD-10-CM | POA: Diagnosis not present

## 2017-01-09 DIAGNOSIS — M4802 Spinal stenosis, cervical region: Secondary | ICD-10-CM | POA: Insufficient documentation

## 2017-01-09 DIAGNOSIS — M25511 Pain in right shoulder: Secondary | ICD-10-CM | POA: Diagnosis not present

## 2017-01-09 DIAGNOSIS — Z981 Arthrodesis status: Secondary | ICD-10-CM | POA: Insufficient documentation

## 2017-01-09 DIAGNOSIS — G894 Chronic pain syndrome: Secondary | ICD-10-CM

## 2017-01-09 MED ORDER — BACLOFEN 20 MG PO TABS: 30.0000 mg | ORAL_TABLET | Freq: Three times a day (TID) | ORAL | 1 refills | Status: DC

## 2017-01-09 MED ORDER — DULOXETINE HCL 30 MG PO CPEP: 30.0000 mg | ORAL_CAPSULE | Freq: Every day | ORAL | 1 refills | Status: DC

## 2017-01-09 NOTE — Progress Notes (Signed)
Subjective:    Patient ID: Amy Bruce, female    DOB: 1950-12-16, 66 y.o.   MRN: [id number]  HPI  66 y/o female with pmh/psh ofHTN, anemia, B12 deficiency, rheumatic fever, cervical fusion present for follow up for right shoulder pain.   Initially stated: Present since fusion 1991, getting worse in last couple weeks.  She fractured her humerus in 2016 and states she was slinged.  No alleviating factors. Everything exacerbates the pain.  Sharp pain.  Radiates to hands.  She has associated numbness.  Ibu does not help. Narco, flexaril helps. Denies falls. Pain limits from doing ADLs. Pt notes she had similar symptoms on left side before first fusion. Pt relocated 12/2014 from [place], [place] where she was being followed by Pain clinic.    Last clinic visit 12/12/16. Since last visit, she was supposed to bring imaging of her shoulder, which she states she forgot.  Pt is unsure if weight gain is associated with Gabapentin. Baclofen increase did not help.  She states she went to PT without much benefit.  She found exercises online is trying to do those at home.  She has significant improvement with brace for CTS. BP remains elevated.     Pain Inventory Average Pain 7 Pain Right Now 7 My pain is burning and tingling  In the last 24 hours, has pain interfered with the following? General activity 6 Relation with others 6 Enjoyment of life 6 What TIME of day is your pain at its worst? all Sleep (in general) Fair  Pain is worse with: sitting and some activites Pain improves with: therapy/exercise, medication and TENS Relief from Meds: 6  Mobility walk without assistance how many minutes can you walk? 10 ability to climb steps?  yes do you drive?  yes transfers alone Do you have any goals in this area?  no  Function retired Do you have any goals in this area?  yes  Neuro/Psych weakness numbness tingling spasms  Prior Studies Any changes since last visit?  no  Physicians  involved in your care Any changes since last visit?  no   No pertinent family history of neck/shoulder pain. Social History   Social History  . Marital status: Unknown    Spouse name: N/A  . Number of children: N/A  . Years of education: N/A   Social History Main Topics  . Smoking status: Current Some Day Smoker    Packs/day: 0.50    Types: Cigarettes  . Smokeless tobacco: Never Used  . Alcohol use Yes     Comment: 1-2 wk  . Drug use: No  . Sexual activity: Not Asked   Other Topics Concern  . None   Social History Narrative  . None   Past surgical history: Cervical fusion. Past medical history: HTN, anemia, B12 deficiency, rheumatic fever, chronic pain BP (!) 168/83 (BP Location: Left Arm, Patient Position: Sitting, Cuff Size: Normal)   Pulse 74   Resp 16   Wt 153 lb (69.4 kg)   SpO2 95%   Opioid Risk Score:   Fall Risk Score:  `1  Depression screen PHQ 2/9  Depression screen [facility] 2/9 12/12/2016 11/08/2016 10/11/2016  Decreased Interest 0 0 0  Down, Depressed, Hopeless 0 0 0  PHQ - 2 Score 0 0 0  Altered sleeping - - 0  Tired, decreased energy - - 0  Change in appetite - - 0  Feeling bad or failure about yourself  - - 0  Trouble concentrating - -  0  Moving slowly or fidgety/restless - - 0  Suicidal thoughts - - 0  PHQ-9 Score - - 0  Difficult doing work/chores - - Not difficult at all   Review of Systems  Constitutional: Positive for unexpected weight change.       Weight gain in relatively short amount of time  HENT: Negative.   Eyes: Negative.   Respiratory: Negative.   Cardiovascular: Negative.   Gastrointestinal: Negative.   Endocrine: Negative.   Genitourinary: Negative.   Musculoskeletal: Positive for arthralgias, back pain, myalgias and neck pain.       Spasms  Skin: Negative.   Allergic/Immunologic: Negative.   Neurological: Positive for weakness and numbness.  Hematological: Negative.   Psychiatric/Behavioral: Negative.   All other systems  reviewed and are negative.     Objective:   Physical Exam Gen: NAD. Vital signs reviewed HENT: Normocephalic, Atraumatic Eyes: EOMI. No discharge.  Cardio: RRR. No JVD. Pulm: B/l clear to auscultation.  Effort normal Abd: Soft, BS+ MSK:  Gait WNL.   +TTO along right traps, and right shoulder.    No edema.  Neuro:   Sensation diminished to light touch in hand b/l R>L  Strength  5/5 in all LE myotomes (Right side limited by pain) Skin: Warm and Dry. Intact    Assessment & Plan:  66 y/o female with pmh/psh of  HTN, anemia, B12 deficiency, rheumatic fever, cervical fusion present for follow up of right shoulder pain.    1. Chronic right shoulder and neck pain   Xrays reports reviewed from [facility], C3, C6 foraminal stenosis and fusion  NCS/EMG showing subacute/chronic C5 radic. Pt to bring additional imaging of shoulder  Heat/Cold ineffective  Cont TENS  Cont Gabapentin 300 TID  Cont Mobic 15mg  daily with food  Will increase Baclofen to 30 TID  Will order PT with consideration for traction - encouraged follow up for specifically traction  Will order Cymbalta 30, consider increase to 60 on next visit  Will consider Voltaren gel and Lidoderm patch in future after imaging of shoulder  Will order shoulder xray   2. Myalgia and muscle spasms  Will consider Trigger point injections in future  See #1  3. Right CTS  Possible double crush  See #1  Cont brace  4. HTN  Elevated in office again, encouraged follow up   Being adjusted by PCP

## 2017-01-11 ENCOUNTER — Ambulatory Visit: Payer: Medicare HMO | Admitting: Physical Therapy

## 2017-01-15 ENCOUNTER — Ambulatory Visit: Payer: Medicare HMO | Admitting: Physical Therapy

## 2017-01-16 ENCOUNTER — Encounter: Payer: Self-pay | Admitting: Acute Care

## 2017-01-16 ENCOUNTER — Ambulatory Visit (INDEPENDENT_AMBULATORY_CARE_PROVIDER_SITE_OTHER)
Admission: RE | Admit: 2017-01-16 | Discharge: 2017-01-16 | Disposition: A | Discharge status: Home / Self Care | Payer: Medicare HMO | Source: Ambulatory Visit | Attending: Acute Care | Admitting: Acute Care

## 2017-01-16 ENCOUNTER — Ambulatory Visit (INDEPENDENT_AMBULATORY_CARE_PROVIDER_SITE_OTHER): Payer: Medicare HMO | Admitting: Acute Care

## 2017-01-16 DIAGNOSIS — F17210 Nicotine dependence, cigarettes, uncomplicated: Principal | ICD-10-CM

## 2017-01-16 DIAGNOSIS — Z87891 Personal history of nicotine dependence: Secondary | ICD-10-CM

## 2017-01-16 NOTE — Progress Notes (Signed)
Shared Decision Making Visit Lung Cancer Screening Program [phone])   Eligibility:  Age 66 y.o.  Pack Years Smoking History Calculation 33-pack-year smoking history (# packs/per year x # years smoked)  Recent History of coughing up blood  no  Unexplained weight loss? no ( >Than 15 pounds within the last 6 months )  Prior History Lung / other cancer no (Diagnosis within the last 5 years already requiring surveillance chest CT Scans).  Smoking Status Current Smoker  Former Smokers: Years since quit:NA  Quit Date: NA  Visit Components:  Discussion included one or more decision making aids. yes  Discussion included risk/benefits of screening. yes  Discussion included potential follow up diagnostic testing for abnormal scans. yes  Discussion included meaning and risk of over diagnosis. yes  Discussion included meaning and risk of False Positives. yes  Discussion included meaning of total radiation exposure. yes  Counseling Included:  Importance of adherence to annual lung cancer LDCT screening. yes  Impact of comorbidities on ability to participate in the program. yes  Ability and willingness to under diagnostic treatment. yes  Smoking Cessation Counseling:  Current Smokers:   Discussed importance of smoking cessation. yes  Information about tobacco cessation classes and interventions provided to patient. yes  Patient provided with "ticket" for LDCT Scan. yes  Symptomatic Patient. no  CounselingNA  Diagnosis Code: Tobacco Use Z72.0  Asymptomatic Patient yes  Counseling (Intermediate counseling: > three minutes counseling) J6967  Former Smokers:   Discussed the importance of maintaining cigarette abstinence. yes  Diagnosis Code: Personal History of Nicotine Dependence. E93.810  Information about tobacco cessation classes and interventions provided to patient. Yes  Patient provided with "ticket" for LDCT Scan. yes  Written Order for Lung Cancer  Screening with LDCT placed in Epic. Yes (CT Chest Lung Cancer Screening Low Dose W/O CM) [id number] Z12.2-Screening of respiratory organs Z87.891-Personal history of nicotine dependence  I have spent 25 minutes of face to face time with Amy Bruce discussing the risks and benefits of lung cancer screening. We viewed a power point together that explained in detail the above noted topics. We paused at intervals to allow for questions to be asked and answered to ensure understanding.We discussed that the single most powerful action that she can take to decrease her risk of developing lung cancer is to quit smoking. We discussed whether or not she is ready to commit to setting a quit date. She is currently not ready to set a quit date. We discussed options for tools to aid in quitting smoking including nicotine replacement therapy, non-nicotine medications, support groups, Quit Smart classes, and behavior modification. We discussed that often times setting smaller, more achievable goals, such as eliminating 1 cigarette a day for a week and then 2 cigarettes a day for a week can be helpful in slowly decreasing the number of cigarettes smoked. This allows for a sense of accomplishment as well as providing a clinical benefit. I gave her the " Be Stronger Than Your Excuses" card with contact information for community resources, classes, free nicotine replacement therapy, and access to mobile apps, text messaging, and on-line smoking cessation help. I have also given her my card and contact information in the event she needs to contact me. We discussed the time and location of the scan, and that either Doroteo Glassman RN or I will call with the results within 24-48 hours of receiving them. I have offered her  a copy of the power point we viewed  as a resource  in the event they need reinforcement of the concepts we discussed today in the office. The patient verbalized understanding of all of  the above and had no further  questions upon leaving the office. They have my contact information in the event they have any further questions.  I spent 4 minutes counseling on smoking cessation and the health risks of continued tobacco abuse.  I explained to the patient that there has been a high incidence of coronary artery disease noted on these exams. I explained that this is a non-gated exam therefore degree or severity cannot be determined. This patient is currently on statin therapy. I have asked the patient to follow-up with their PCP regarding any incidental finding of coronary artery disease and management with diet or medication as their PCP  feels is clinically indicated. The patient verbalized understanding of the above and had no further questions upon completion of the visit.      Amy Spatz, NP 01/16/2017

## 2017-01-18 ENCOUNTER — Ambulatory Visit: Payer: Medicare HMO | Admitting: Physical Therapy

## 2017-01-18 ENCOUNTER — Encounter: Payer: Self-pay | Admitting: Physical Therapy

## 2017-01-18 DIAGNOSIS — M79601 Pain in right arm: Secondary | ICD-10-CM

## 2017-01-18 DIAGNOSIS — G8929 Other chronic pain: Secondary | ICD-10-CM | POA: Diagnosis not present

## 2017-01-18 DIAGNOSIS — M25511 Pain in right shoulder: Secondary | ICD-10-CM

## 2017-01-18 DIAGNOSIS — M542 Cervicalgia: Secondary | ICD-10-CM

## 2017-01-18 NOTE — Therapy (Signed)
[facility] [address] [place], [place], [address] Phone: [phone]   Fax:  [phone]  Physical Therapy Treatment  Patient Details  Name: Amy Bruce MRN: [id number] Date of Birth: 05/03/1951 Referring Provider: Marcello Fennel, MD  Encounter Date: 01/18/2017      PT End of Session - 01/18/17 0934    Visit Number 2   Number of Visits 5   Date for PT Re-Evaluation 03/08/17   Authorization Type [employer] MCR KX at visit 15   PT Start Time 0934   PT Stop Time 1026   PT Time Calculation (min) 52 min   Activity Tolerance Patient tolerated treatment well   Behavior During Therapy [facility] for tasks assessed/performed      History reviewed. No pertinent past medical history.  Past Surgical History:  Procedure Laterality Date  . ABDOMINAL HYSTERECTOMY  1999  . CERVICAL SPINE SURGERY  1991   C5/6 6/7  . Left axillary cyst removal 1989    . TONSILECTOMY, ADENOIDECTOMY, BILATERAL MYRINGOTOMY AND TUBES  1981  . TUBAL LIGATION  1981    There were no vitals filed for this visit.      Subjective Assessment - 01/18/17 0934    Subjective pt reports TTP at post R shoulder that referrs to lateral shoulder. began feeling pain in L shoulder. Occasional HA. Now L hand also feels like it becomes numb and drops objects. Feels that stretches were helpful that were given at initial evaluation and has been doing them.    Patient Stated Goals fixing hair/using hair tools, lift R arm to put on deoderant, write using R arm(has to adjust pen/pencil), reduce R upper arm/forearm burning, bowl, crochet (able now for short time), use computer mouse   Currently in Pain? Yes   Pain Score 7    Pain Location Shoulder   Pain Orientation Right;Left  R>L   Pain Descriptors / Indicators Stabbing;Throbbing;Burning   Pain Frequency Constant   Aggravating Factors  always in pain   Pain Relieving Factors meds            [facility] PT Assessment -  01/18/17 0001      Assessment   Medical Diagnosis cervicalgia, R arm pain, myalgia   Referring Provider Ankit Karis Juba, MD   Hand Dominance Right     Precautions   Precaution Comments anxiety/panic attacks     Observation/Other Assessments   Focus on Therapeutic Outcomes (FOTO)  54% ability     Sensation   Additional Comments pain into R lateral shoulder and across bilat superior shoulders     Palpation   Palpation comment concordant pain to notable trigger point in R upper trap                     [facility] Adult PT Treatment/Exercise - 01/18/17 0001      Modalities   Modalities Moist Heat     Moist Heat Therapy   Number Minutes Moist Heat 15 Minutes  concurrent with education 7 min   Moist Heat Location Cervical;Shoulder     Manual Therapy   Manual therapy comments manual sustained cervical traction                PT Education - 01/18/17 1148    Education provided Yes   Education Details anatomy of condition, anatomical imperfections v functional disabilities, trigger point dry needling, traction, POC, progress toward goals   Person(s) Educated Patient   Methods Explanation;Handout  TDN info  handout   Comprehension Verbalized understanding;Need further instruction          PT Short Term Goals - 01/18/17 6734      PT SHORT TERM GOAL #1   Title pt will demo at least 10 deg increase in GHJ ROM by    Baseline see flowsheet   Status On-going     PT SHORT TERM GOAL #2   Title Pt will be able to hold her arm up to don deoderant without help of L arm   Baseline able   Status Achieved           PT Long Term Goals - 01/18/17 0953      PT LONG TERM GOAL #1   Title Pt will be able to write appropraitely using R arm without limitation by 6/29   Baseline still difficult, can write about 50% of want she wants to   Status Partially Met     PT LONG TERM GOAL #2   Title Pt will be able to use her hair tools and 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 She is very focused on diagnosed herniations and surgical interventions and spoke of them frequently. We discussed how having anatomical imperfections and changes does not necessarily have to affect her function, the point of medical intervention was to decrease pain and improve daily life. Pt feels limited by copay amount but would like to try trigger point dry needling and continue with cervical traction due to decrease in pain. We agreed upon 1/week every other week as her POC. Will continue manual traction rather than mechanical due to fusions. Pt will benefit from skilled PT in order to decrease pain with manual techniques and create an appropriate HEP for maintenance after d/c.    Rehab Potential Good   PT Frequency 1x / week   PT Duration --  every other week   PT Treatment/Interventions ADLs/Self Care Home Management;Cryotherapy;Electrical Stimulation;Iontophoresis '4mg'$ /ml Dexamethasone;Functional mobility training;Ultrasound;Moist Heat;Therapeutic activities;Therapeutic exercise;Traction;Neuromuscular re-education;Patient/family education;Passive range of motion;Manual techniques;Dry needling;Taping   PT Next Visit Plan manual cervical traction, TDN to begin in June   PT Home Exercise Plan scapular retraction, table flexion stretch, tennis ball wall for periscap region   Consulted and Agree with Plan of Care  Patient      Patient will benefit from skilled therapeutic intervention in order to improve the following deficits and impairments:  Decreased range of motion, Impaired UE functional use, Increased muscle spasms, Decreased activity tolerance, Pain, Improper body mechanics, Impaired flexibility, Decreased strength, Postural dysfunction  Visit Diagnosis: Cervicalgia - Plan: PT plan of care cert/re-cert  Chronic right shoulder pain - Plan: PT plan of care cert/re-cert  Pain in right arm - Plan: PT plan of care cert/re-cert  G-Codes - 01/18/17 1155    Functional Assessment Tool Used (Outpatient Only) FOTO 54% ability (goal 60%), clinical judgement   Functional Limitation Other PT primary   Other PT Primary Current Status (H4035) At least 40 percent but less than 60 percent impaired, limited or restricted   Other PT Primary Goal Status (C4818) At least 20 percent but less than 40 percent impaired, limited or restricted      Problem List Patient Active Problem List   Diagnosis Date Noted  . Chronic pain syndrome 10/11/2016  . Right shoulder pain 10/11/2016  . Neck pain, chronic 10/11/2016  . Numbness and tingling in right hand 10/11/2016  . Myalgia 10/11/2016    Sharnika Binney C. Davius Goudeau PT, DPT 01/18/17 11:59 AM   [place] [facility] [address] [place], [place], [address] Phone: [phone]   Fax:  [phone]  Name: Amy Bruce MRN: [id number] Date of Birth: 18-Nov-1950

## 2017-01-22 ENCOUNTER — Ambulatory Visit: Payer: Medicare HMO | Admitting: Physical Therapy

## 2017-01-24 ENCOUNTER — Telehealth: Payer: Self-pay

## 2017-01-24 NOTE — Telephone Encounter (Signed)
I'm not sure what medication she is referring to, but I do not think it is the Duloxetine causing her increase in BP, unless her BP is even higher.  On review, her SBPs were 160-170 prior to medication initiation.

## 2017-01-24 NOTE — Telephone Encounter (Signed)
Patient called and reported that she has stopped taking duloxetine due to severe side effects that included swelling, increased blood pressure, lack of coordination and other problems, has also requested that she can be placed back onto the medications that she was on before coming to this clinic that didn't increase her blood pressure

## 2017-01-25 ENCOUNTER — Ambulatory Visit: Payer: Medicare HMO | Admitting: Physical Therapy

## 2017-01-25 DIAGNOSIS — Z720 Tobacco use: Secondary | ICD-10-CM | POA: Diagnosis not present

## 2017-01-25 DIAGNOSIS — R3129 Other microscopic hematuria: Secondary | ICD-10-CM | POA: Diagnosis not present

## 2017-01-25 DIAGNOSIS — I10 Essential (primary) hypertension: Secondary | ICD-10-CM | POA: Diagnosis not present

## 2017-01-28 ENCOUNTER — Other Ambulatory Visit: Payer: Self-pay | Admitting: Acute Care

## 2017-01-28 DIAGNOSIS — F17210 Nicotine dependence, cigarettes, uncomplicated: Principal | ICD-10-CM

## 2017-01-28 NOTE — Telephone Encounter (Signed)
Thanks

## 2017-01-28 NOTE — Telephone Encounter (Signed)
She is doing better off the cymbalta and has seen her pcp who has switched her to lisinopril/hctz and her bp is within a normal range now. ( It had gotten up to 226/95). She has appt 02/07/17 and can discuss more then.

## 2017-01-29 ENCOUNTER — Ambulatory Visit: Payer: Medicare HMO | Admitting: Physical Therapy

## 2017-02-01 ENCOUNTER — Encounter: Payer: Self-pay | Admitting: Physical Therapy

## 2017-02-01 ENCOUNTER — Ambulatory Visit: Payer: Medicare HMO | Admitting: Physical Therapy

## 2017-02-01 DIAGNOSIS — M542 Cervicalgia: Secondary | ICD-10-CM | POA: Diagnosis not present

## 2017-02-01 DIAGNOSIS — G8929 Other chronic pain: Secondary | ICD-10-CM | POA: Diagnosis not present

## 2017-02-01 DIAGNOSIS — M25511 Pain in right shoulder: Secondary | ICD-10-CM | POA: Diagnosis not present

## 2017-02-01 DIAGNOSIS — M79601 Pain in right arm: Secondary | ICD-10-CM | POA: Diagnosis not present

## 2017-02-01 NOTE — Therapy (Signed)
Amy Bruce, [place], [address] Phone: [phone]   Fax:  [phone]  Physical Therapy Treatment  Patient Details  Name: Amy Bruce MRN: [id number] Date of Birth: [birth year] Referring Provider: Jamse Arn, MD  Encounter Date: 02/01/2017      PT End of Session - 02/01/17 0933    Visit Number 3   Number of Visits 5   Date for PT Re-Evaluation 03/08/17   Authorization Type [employer] MCR KX at visit 15   PT Start Time 0934   PT Stop Time 1016   PT Time Calculation (min) 42 min   Activity Tolerance Patient tolerated treatment well   Behavior During Therapy [facility] for tasks assessed/performed      History reviewed. No pertinent past medical history.  Past Surgical History:  Procedure Laterality Date  . ABDOMINAL HYSTERECTOMY  1999  . [place]   C5/6 6/7  . Left axillary cyst removal 1989    . TONSILECTOMY, ADENOIDECTOMY, BILATERAL MYRINGOTOMY AND TUBES  1981  . TUBAL LIGATION  1981    There were no vitals filed for this visit.      Subjective Assessment - 02/01/17 0934    Subjective Pt reports she is feeling pretty good today. "Back, right, shoulder muscle and of course my neck and right outside arm" are in pain. HEP is going wonderful, tennis ball makes all of the nerves tingle but it all settles down.    Patient Stated Goals fixing hair/using hair tools, lift R arm to put on deoderant, write using R arm(has to adjust pen/pencil), reduce R upper arm/forearm burning, bowl, crochet (able now for short time), use computer mouse   Currently in Pain? Yes   Pain Score 7    Pain Location Arm   Pain Orientation Right   Pain Descriptors / Indicators --  hurts   Aggravating Factors  constant pain            [facility] PT Assessment - 02/01/17 0001      AROM   Right Shoulder Flexion 127 Degrees  133 on pulleys   Right Shoulder ABduction 108 Degrees     Strength   Right  Shoulder Flexion 3-/5   Right Shoulder External Rotation 4-/5   Left Shoulder External Rotation 4+/5                     [facility] Adult PT Treatment/Exercise - 02/01/17 0001      Shoulder Exercises: Supine   External Rotation 20 reps   Theraband Level (Shoulder External Rotation) Level 1 (Yellow)   Flexion 10 reps   Shoulder Flexion Weight (lbs) 2   Other Supine Exercises chest press 2lb     Shoulder Exercises: Pulleys   Flexion 3 minutes     Manual Therapy   Manual therapy comments manual sustained cervical traction                PT Education - 02/01/17 0938    Education provided Yes   Education Details exercise form/rationale, desensitization of arm, training strength of arm regardless of sensation changes,    Person(s) Educated Patient   Methods Explanation;Demonstration;Tactile cues;Verbal cues   Comprehension Verbalized understanding;Returned demonstration;Verbal cues required;Tactile cues required;Need further instruction          PT Short Term Goals - 01/18/17 6599      PT SHORT TERM GOAL #1   Title pt will demo at least 10 deg increase  in GHJ ROM by    Baseline see flowsheet   Status On-going     PT SHORT TERM GOAL #2   Title Pt will be able to hold her arm up to don deoderant without help of L arm   Baseline able   Status Achieved           PT Long Term Goals - 01/18/17 0953      PT LONG TERM GOAL #1   Title Pt will be able to write appropraitely using R arm without limitation by 6/29   Baseline still difficult, can write about 50% of want she wants to   Status Partially Met     PT LONG TERM GOAL #2   Title Pt will be able to use her hair tools and 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 Focused on activation of postural extensors and scapular retraction for functional endurance. Pt reported feeling "relaxed" after treatment today. Discussed importance of decreasing hypersensitivity with desensitization techniques as sensory changes occur. Significant improvement in available AROM noted today.    PT Next Visit Plan TPDN   PT Home Exercise Plan scapular retraction, table flexion stretch, tennis ball wall for periscap region; row, supine flexion, supine chest press, supine resisted ext rot   Consulted and Agree with Plan of Care Patient      Patient will benefit from skilled therapeutic intervention in order to improve the following deficits and impairments:     Visit Diagnosis: Cervicalgia  Chronic right shoulder pain  Pain in right arm     Problem List Patient Active Problem List   Diagnosis Date Noted  . Chronic pain syndrome 10/11/2016  . Right shoulder pain 10/11/2016  . Neck pain, chronic 10/11/2016  . Numbness and tingling in right hand 10/11/2016  . Myalgia 10/11/2016    Jessica C. Hightower PT, DPT 02/01/17 10:22 AM   [facility] [address] [place], [place], [address] Phone: [phone]   Fax:  [phone]  Name: Amy Bruce MRN: [id number] Date of Birth: 07-13-1951

## 2017-02-03 ENCOUNTER — Other Ambulatory Visit: Payer: Self-pay | Admitting: Physical Medicine & Rehabilitation

## 2017-02-07 ENCOUNTER — Encounter: Payer: Self-pay | Admitting: Physical Medicine & Rehabilitation

## 2017-02-07 ENCOUNTER — Other Ambulatory Visit: Payer: Self-pay | Admitting: Physical Medicine & Rehabilitation

## 2017-02-07 ENCOUNTER — Encounter (HOSPITAL_BASED_OUTPATIENT_CLINIC_OR_DEPARTMENT_OTHER): Payer: Medicare HMO | Admitting: Physical Medicine & Rehabilitation

## 2017-02-07 VITALS — BP 144/81 | HR 74 | Ht 60.0 in | Wt 147.6 lb

## 2017-02-07 DIAGNOSIS — F17210 Nicotine dependence, cigarettes, uncomplicated: Secondary | ICD-10-CM | POA: Diagnosis not present

## 2017-02-07 DIAGNOSIS — I10 Essential (primary) hypertension: Secondary | ICD-10-CM | POA: Diagnosis not present

## 2017-02-07 DIAGNOSIS — M791 Myalgia, unspecified site: Secondary | ICD-10-CM

## 2017-02-07 DIAGNOSIS — M5412 Radiculopathy, cervical region: Secondary | ICD-10-CM

## 2017-02-07 DIAGNOSIS — R202 Paresthesia of skin: Secondary | ICD-10-CM | POA: Diagnosis not present

## 2017-02-07 DIAGNOSIS — R200 Anesthesia of skin: Secondary | ICD-10-CM | POA: Diagnosis not present

## 2017-02-07 DIAGNOSIS — G894 Chronic pain syndrome: Secondary | ICD-10-CM

## 2017-02-07 DIAGNOSIS — G8929 Other chronic pain: Secondary | ICD-10-CM | POA: Diagnosis not present

## 2017-02-07 DIAGNOSIS — G5601 Carpal tunnel syndrome, right upper limb: Secondary | ICD-10-CM

## 2017-02-07 DIAGNOSIS — M542 Cervicalgia: Secondary | ICD-10-CM

## 2017-02-07 DIAGNOSIS — M25511 Pain in right shoulder: Secondary | ICD-10-CM

## 2017-02-07 DIAGNOSIS — Z720 Tobacco use: Secondary | ICD-10-CM | POA: Diagnosis not present

## 2017-02-07 DIAGNOSIS — Z981 Arthrodesis status: Secondary | ICD-10-CM | POA: Diagnosis not present

## 2017-02-07 DIAGNOSIS — E538 Deficiency of other specified B group vitamins: Secondary | ICD-10-CM | POA: Diagnosis not present

## 2017-02-07 DIAGNOSIS — D649 Anemia, unspecified: Secondary | ICD-10-CM | POA: Diagnosis not present

## 2017-02-07 DIAGNOSIS — M4802 Spinal stenosis, cervical region: Secondary | ICD-10-CM | POA: Diagnosis not present

## 2017-02-07 MED ORDER — GABAPENTIN 600 MG PO TABS: 600.0000 mg | ORAL_TABLET | Freq: Three times a day (TID) | ORAL | 1 refills | Status: DC

## 2017-02-07 MED ORDER — DICLOFENAC SODIUM 50 MG PO TBEC: 50.0000 mg | DELAYED_RELEASE_TABLET | Freq: Three times a day (TID) | ORAL | 1 refills | Status: DC

## 2017-02-07 MED ORDER — CYCLOBENZAPRINE HCL 10 MG PO TABS: 10.0000 mg | ORAL_TABLET | Freq: Three times a day (TID) | ORAL | 1 refills | Status: DC | PRN

## 2017-02-07 NOTE — Progress Notes (Signed)
Subjective:    Patient ID: Amy Bruce, female    DOB: [birth year], 66 y.o.   MRN: [id number]  HPI  66 y/o female with pmh/psh ofHTN, anemia, B12 deficiency, rheumatic fever, cervical fusion present for follow up for right shoulder pain.   Initially stated: Present since fusion 1991, getting worse in last couple weeks.  She fractured her humerus in 2016 and states she was slinged.  No alleviating factors. Everything exacerbates the pain.  Sharp pain.  Radiates to hands.  She has associated numbness.  Ibu does not help. Narco, flexaril helps. Denies falls. Pain limits from doing ADLs. Pt notes she had similar symptoms on left side before first fusion. Pt relocated 12/2014 from [place], [place] where she was being followed by Pain clinic.    Last clinic visit 01/09/17. This AM, she states she is not feeling well with nausea/vomitting this AM with dizziness.  Since last visit, she continues to take medications as prescribed.  She does not notice much improvement with Mobic and Baclofen. Pt is in PT, she tried traction device with benefit.  She stopped taking Cymbalta due to side effects. She never obtained xray of shoulder.  Her BP has improved.    Pain Inventory Average Pain 7 Pain Right Now 7 My pain is burning and tingling  In the last 24 hours, has pain interfered with the following? General activity 6 Relation with others 6 Enjoyment of life 6 What TIME of day is your pain at its worst? all Sleep (in general) Fair  Pain is worse with: sitting and some activites Pain improves with: therapy/exercise, medication and TENS Relief from Meds: 6  Mobility walk without assistance how many minutes can you walk? 10 ability to climb steps?  yes do you drive?  yes transfers alone Do you have any goals in this area?  no  Function retired Do you have any goals in this area?  yes  Neuro/Psych weakness numbness tingling spasms  Prior Studies Any changes since last visit?   no  Physicians involved in your care Any changes since last visit?  no   No pertinent family history of neck/shoulder pain. Social History   Social History  . Marital status: Unknown    Spouse name: N/A  . Number of children: N/A  . Years of education: N/A   Social History Main Topics  . Smoking status: Current Some Day Smoker    Packs/day: 1.00    Years: 33.00    Types: Cigarettes  . Smokeless tobacco: Never Used  . Alcohol use Yes     Comment: 1-2 wk  . Drug use: No  . Sexual activity: Not Asked   Other Topics Concern  . None   Social History Narrative  . None   Past surgical history: Cervical fusion. Past medical history: HTN, anemia, B12 deficiency, rheumatic fever, chronic pain BP (!) 144/81   Pulse 74   Ht 5' (1.524 m)   Wt 147 lb 9.6 oz (67 kg)   SpO2 96%   BMI 28.83 kg/m   Opioid Risk Score:   Fall Risk Score:  `1  Depression screen PHQ 2/9  Depression screen [facility] 2/9 12/12/2016 11/08/2016 10/11/2016  Decreased Interest 0 0 0  Down, Depressed, Hopeless 0 0 0  PHQ - 2 Score 0 0 0  Altered sleeping - - 0  Tired, decreased energy - - 0  Change in appetite - - 0  Feeling bad or failure about yourself  - - 0  Trouble concentrating - - 0  Moving slowly or fidgety/restless - - 0  Suicidal thoughts - - 0  PHQ-9 Score - - 0  Difficult doing work/chores - - Not difficult at all   Review of Systems  Constitutional: Positive for unexpected weight change.       Weight gain in relatively short amount of time  HENT: Negative.   Eyes: Negative.   Respiratory: Negative.   Cardiovascular: Negative.   Gastrointestinal: Positive for nausea and vomiting.  Endocrine: Negative.   Genitourinary: Negative.   Musculoskeletal: Positive for arthralgias, back pain, myalgias and neck pain.       Spasms  Skin: Negative.   Allergic/Immunologic: Negative.   Neurological: Positive for dizziness, weakness, light-headedness and numbness.  Hematological: Negative.    Psychiatric/Behavioral: Negative.   All other systems reviewed and are negative.     Objective:   Physical Exam Gen: NAD. Vital signs reviewed HENT: Normocephalic, Atraumatic Eyes: EOMI. No discharge.  Cardio: RRR. No JVD. Pulm: B/l clear to auscultation.  Effort normal Abd: Soft, BS+ MSK:  Gait WNL.   +TTP along right traps, and right shoulder.    No edema.  Neuro:   Sensation diminished to light touch in hand b/l R>L  Strength  5/5 in all LE myotomes (Right side limited by pain) Skin: Warm and Dry. Intact    Assessment & Plan:  66 y/o female with pmh/psh of  HTN, anemia, B12 deficiency, rheumatic fever, cervical fusion present for follow up of right shoulder pain.    1. Chronic right shoulder and neck pain   Xrays reports reviewed from [facility], C3, C6 foraminal stenosis and fusion  NCS/EMG showing subacute/chronic C5 radic. Pt to bring additional imaging of shoulder  Heat/Cold ineffective  Unable to tolerate Cymbalta  Cont TENS  Gabapentin increased to 600 TID  Will d/c Mobic 15mg  daily with food due to efficacy.  Trial diclofenac 50 TID.  Will d/c Baclofen to 30 TID. Will order Flexaril 10 TID - pt states this has worked for her in the past.   Cont PT with traction  Will consider Voltaren gel and Lidoderm patch in future after imaging of shoulder, await imaging  Ordered shoulder xray, pt to follow up   2. Myalgia and muscle spasms  Will consider Trigger point injections in future  See #1  3. Right CTS  Possible double crush  See #1  Cont brace  4. HTN  Sill elevated, but improved  Being adjusted by PCP   5. Tobacco abuse  Smoking 1/2 PPD  Encouraged weaning  6. Likely Viral GI illness  Encouraged follow up with PCP

## 2017-02-07 NOTE — Telephone Encounter (Signed)
Patient seen today.  We have received electronic request to change diclofenac 50 mg tabs to a 90 day supply.  Please advise or resubmit order

## 2017-02-09 ENCOUNTER — Other Ambulatory Visit: Payer: Self-pay | Admitting: Physical Medicine & Rehabilitation

## 2017-02-15 ENCOUNTER — Encounter: Payer: Self-pay | Admitting: Physical Therapy

## 2017-02-15 ENCOUNTER — Ambulatory Visit: Payer: Medicare HMO | Attending: Physical Medicine & Rehabilitation | Admitting: Physical Therapy

## 2017-02-15 ENCOUNTER — Telehealth: Payer: Self-pay | Admitting: *Deleted

## 2017-02-15 DIAGNOSIS — M542 Cervicalgia: Secondary | ICD-10-CM | POA: Insufficient documentation

## 2017-02-15 DIAGNOSIS — G8929 Other chronic pain: Secondary | ICD-10-CM | POA: Diagnosis not present

## 2017-02-15 DIAGNOSIS — M25511 Pain in right shoulder: Secondary | ICD-10-CM | POA: Diagnosis not present

## 2017-02-15 DIAGNOSIS — M79601 Pain in right arm: Secondary | ICD-10-CM | POA: Diagnosis not present

## 2017-02-15 NOTE — Therapy (Addendum)
[place], [place], [address] Phone: [phone]   Fax:  [phone]  Physical Therapy Treatment/Discharge Summary  Patient Details  Name: Amy Bruce MRN: [id number] Date of Birth: Nov 01, 1950 Referring Provider: Jamse Arn, MD  Encounter Date: 02/15/2017      PT End of Session - 02/15/17 0933    Visit Number 4   Number of Visits 5   Date for PT Re-Evaluation 03/08/17   Authorization Type [employer] M[employer] KX at visit 15   PT Start Time 0930   PT Stop Time 1013   PT Time Calculation (min) 43 min   Activity Tolerance Patient tolerated treatment well   Behavior During Therapy [facility] for tasks assessed/performed      History reviewed. No pertinent past medical history.  Past Surgical History:  Procedure Laterality Date  . ABDOMINAL HYSTERECTOMY  1999  . [place]   C5/6 6/7  . Left axillary cyst removal 1989    . TONSILECTOMY, ADENOIDECTOMY, BILATERAL MYRINGOTOMY AND TUBES  1981  . TUBAL LIGATION  1981    There were no vitals filed for this visit.      Subjective Assessment - 02/15/17 0933    Subjective Pt reports she is overall feeling better, HEP is helping. Spoke with Dr Posey Pronto and is going to do trigger point injections rather than dry needling. Pt reports a 5/10 but states "I don't feel the pain or the burning"   Patient Stated Goals fixing hair/using hair tools, lift R arm to put on deoderant, write using R arm(has to adjust pen/pencil), reduce R upper arm/forearm burning, bowl, crochet (able now for short time), use computer mouse   Currently in Pain? Yes   Pain Score 5                          [facility] Adult PT Treatment/Exercise - 02/15/17 0001      Shoulder Exercises: Sidelying   External Rotation Weight (lbs) 1   External Rotation Limitations cues for alignment and activation patterns     Shoulder Exercises: ROM/Strengthening   UBE (Upper Arm Bike)  retro 6 min L1                PT Education - 02/15/17 1007    Education provided Yes   Education Details pillows for sleeping posture, what is dry needling & trigger point injection, exercise form/rationale, shifting work from muscles that trigger points are released from, postural endurance   Person(s) Educated Patient   Methods Explanation;Tactile cues;Verbal cues   Comprehension Verbalized understanding;Verbal cues required;Tactile cues required;Need further instruction          PT Short Term Goals - 01/18/17 9924      PT SHORT TERM GOAL #1   Title pt will demo at least 10 deg increase in GHJ ROM by    Baseline see flowsheet   Status On-going     PT SHORT TERM GOAL #2   Title Pt will be able to hold her arm up to don deoderant without help of L arm   Baseline able   Status Achieved           PT Long Term Goals - 02/15/17 0940      PT LONG TERM GOAL #1   Title Pt will be able to write appropraitely using R arm without limitation by 6/29   Baseline denies limitation   Status Achieved  PT LONG TERM GOAL #2   Title Pt will be able to use her hair tools and 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 5/7 days of the week   Baseline 3-4 days/week   Status On-going     PT LONG TERM GOAL #3   Title average pain <=4/10 as well as 75% reduction in referred pain into R arm   Baseline 4/10   Status Achieved     PT LONG TERM GOAL #4   Title FOTO to 60% ability to indicate significant improvement in functional ability   Baseline 54% ability   Status On-going               Plan - 02/15/17 1013    Clinical Impression Statement "I feel great"   Concordant pain in L upper trap trigger points which were released by ischemic compression. Next visit to focus on use of gym equipment for transition to independent exercise. May change her insurance, will call and notify us.    PT Treatment/Interventions ADLs/Self Care Home Management;Cryotherapy;Electrical Stimulation;Iontophoresis '4mg'$ /ml  Dexamethasone;Functional mobility training;Ultrasound;Moist Heat;Therapeutic activities;Therapeutic exercise;Traction;Neuromuscular re-education;Patient/family education;Passive range of motion;Manual techniques;Dry needling;Taping   PT Next Visit Plan gym equipment   PT Home Exercise Plan scapular retraction, table flexion stretch, tennis ball wall for periscap region; row, supine flexion, supine chest press, supine resisted ext rot; sidelying ER & abd   Consulted and Agree with Plan of Care Patient      Patient will benefit from skilled therapeutic intervention in order to improve the following deficits and impairments:  Decreased range of motion, Impaired UE functional use, Increased muscle spasms, Decreased activity tolerance, Pain, Improper body mechanics, Impaired flexibility, Decreased strength, Postural dysfunction  Visit Diagnosis: Cervicalgia  Chronic right shoulder pain  Pain in right arm     Problem List Patient Active Problem List   Diagnosis Date Noted  . Chronic pain syndrome 10/11/2016  . Right shoulder pain 10/11/2016  . Neck pain, chronic 10/11/2016  . Numbness and tingling in right hand 10/11/2016  . Myalgia 10/11/2016    Laurenashley Viar C. Rickey Sadowski PT, DPT 02/15/17 10:18 AM   [facility] [address] [place], [place], [address] Phone: [phone]   Fax:  [phone]  Name: Amy Bruce MRN: [id number] Date of Birth: 04-Mar-1951   PHYSICAL THERAPY DISCHARGE SUMMARY  Visits from Start of Care: 4  Current functional level related to goals / functional outcomes: See above   Remaining deficits: See above   Education / Equipment: Anatomy of condition, POC, HEP, exercise form/rationale  Plan: Patient agrees to discharge.  Patient goals were partially met. Patient is being discharged due to not returning since the last visit.  ?????    Alontae Chaloux C. Sehaj Mcenroe PT, DPT 04/18/17 6:12 PM

## 2017-02-15 NOTE — Telephone Encounter (Signed)
Amy Bruce is calling about her flexeril.  She says that she discussed this with Bruce last week and is waiting to hear.  I do not see the phone call recorded.  Please address.

## 2017-02-18 NOTE — Telephone Encounter (Signed)
Checked both insurance PA for this patient and medication, PA already exists on file through [place] and no PA necessary for medicaid.

## 2017-02-19 NOTE — Telephone Encounter (Signed)
Called patient to inform her on status of medication tier exception and PA status, left message about such, informed her to call back if any questions

## 2017-02-20 NOTE — Telephone Encounter (Signed)
Request for tier exception was denied by [facility], called patient to inform her of the decision.  Patient stated was able to pick up the medication for much less already and stated that medication is working very good

## 2017-02-21 ENCOUNTER — Other Ambulatory Visit: Payer: Self-pay | Admitting: Family Medicine

## 2017-02-21 DIAGNOSIS — Z720 Tobacco use: Secondary | ICD-10-CM | POA: Diagnosis not present

## 2017-02-21 DIAGNOSIS — I10 Essential (primary) hypertension: Secondary | ICD-10-CM | POA: Diagnosis not present

## 2017-02-21 DIAGNOSIS — N6324 Unspecified lump in the left breast, lower inner quadrant: Secondary | ICD-10-CM

## 2017-02-25 ENCOUNTER — Other Ambulatory Visit: Payer: Self-pay | Admitting: Physical Medicine & Rehabilitation

## 2017-02-25 ENCOUNTER — Ambulatory Visit (HOSPITAL_COMMUNITY)
Admission: RE | Admit: 2017-02-25 | Discharge: 2017-02-25 | Disposition: A | Discharge status: Home / Self Care | Payer: Medicare HMO | Source: Ambulatory Visit | Attending: Physical Medicine & Rehabilitation | Admitting: Physical Medicine & Rehabilitation

## 2017-02-25 DIAGNOSIS — G894 Chronic pain syndrome: Secondary | ICD-10-CM

## 2017-02-25 DIAGNOSIS — M25511 Pain in right shoulder: Secondary | ICD-10-CM

## 2017-02-25 DIAGNOSIS — G8929 Other chronic pain: Secondary | ICD-10-CM | POA: Diagnosis present

## 2017-02-25 DIAGNOSIS — M19011 Primary osteoarthritis, right shoulder: Secondary | ICD-10-CM | POA: Diagnosis not present

## 2017-03-01 ENCOUNTER — Ambulatory Visit: Payer: Medicare HMO | Admitting: Physical Therapy

## 2017-03-01 ENCOUNTER — Telehealth: Payer: Self-pay | Admitting: Physical Therapy

## 2017-03-01 NOTE — Telephone Encounter (Signed)
Left message on home number-preferred mobile number did not have VM available. Pt NS for 9:30 apt today and does not have any more scheduled. Requested call back.  Artemisa Sladek C. Beverlyn Mcginness PT, DPT 03/01/17 11:01 AM

## 2017-03-07 ENCOUNTER — Ambulatory Visit
Admission: RE | Admit: 2017-03-07 | Discharge: 2017-03-07 | Disposition: A | Discharge status: Home / Self Care | Payer: Medicare HMO | Source: Ambulatory Visit | Attending: Family Medicine | Admitting: Family Medicine

## 2017-03-07 ENCOUNTER — Encounter: Payer: Self-pay | Admitting: Physical Medicine & Rehabilitation

## 2017-03-07 ENCOUNTER — Encounter: Payer: Medicare HMO | Attending: Physical Medicine & Rehabilitation | Admitting: Physical Medicine & Rehabilitation

## 2017-03-07 VITALS — BP 161/83 | HR 86 | Resp 14

## 2017-03-07 DIAGNOSIS — M791 Myalgia, unspecified site: Secondary | ICD-10-CM

## 2017-03-07 DIAGNOSIS — R200 Anesthesia of skin: Secondary | ICD-10-CM | POA: Diagnosis not present

## 2017-03-07 DIAGNOSIS — G5601 Carpal tunnel syndrome, right upper limb: Secondary | ICD-10-CM | POA: Diagnosis not present

## 2017-03-07 DIAGNOSIS — G8929 Other chronic pain: Secondary | ICD-10-CM | POA: Insufficient documentation

## 2017-03-07 DIAGNOSIS — M542 Cervicalgia: Secondary | ICD-10-CM | POA: Diagnosis not present

## 2017-03-07 DIAGNOSIS — I10 Essential (primary) hypertension: Secondary | ICD-10-CM | POA: Diagnosis not present

## 2017-03-07 DIAGNOSIS — N6324 Unspecified lump in the left breast, lower inner quadrant: Secondary | ICD-10-CM

## 2017-03-07 DIAGNOSIS — M5412 Radiculopathy, cervical region: Secondary | ICD-10-CM

## 2017-03-07 DIAGNOSIS — G894 Chronic pain syndrome: Secondary | ICD-10-CM | POA: Diagnosis not present

## 2017-03-07 DIAGNOSIS — R928 Other abnormal and inconclusive findings on diagnostic imaging of breast: Secondary | ICD-10-CM | POA: Diagnosis not present

## 2017-03-07 DIAGNOSIS — Z981 Arthrodesis status: Secondary | ICD-10-CM | POA: Insufficient documentation

## 2017-03-07 DIAGNOSIS — N6489 Other specified disorders of breast: Secondary | ICD-10-CM | POA: Diagnosis not present

## 2017-03-07 DIAGNOSIS — M25511 Pain in right shoulder: Secondary | ICD-10-CM | POA: Diagnosis not present

## 2017-03-07 DIAGNOSIS — F17210 Nicotine dependence, cigarettes, uncomplicated: Secondary | ICD-10-CM | POA: Diagnosis not present

## 2017-03-07 DIAGNOSIS — R202 Paresthesia of skin: Secondary | ICD-10-CM | POA: Diagnosis not present

## 2017-03-07 DIAGNOSIS — D649 Anemia, unspecified: Secondary | ICD-10-CM | POA: Insufficient documentation

## 2017-03-07 DIAGNOSIS — M4802 Spinal stenosis, cervical region: Secondary | ICD-10-CM | POA: Insufficient documentation

## 2017-03-07 DIAGNOSIS — E538 Deficiency of other specified B group vitamins: Secondary | ICD-10-CM | POA: Diagnosis not present

## 2017-03-07 DIAGNOSIS — Z720 Tobacco use: Secondary | ICD-10-CM

## 2017-03-07 NOTE — Progress Notes (Signed)
Subjective:    Patient ID: Amy Bruce, female    DOB: 1951-08-30, 66 y.o.   MRN: [id number]  HPI  66 y/o female with pmh/psh ofHTN, anemia, B12 deficiency, rheumatic fever, cervical fusion present for follow up for right shoulder pain.   Initially stated: Present since fusion 1991, getting worse in last couple weeks.  She fractured her humerus in 2016 and states she was slinged.  No alleviating factors. Everything exacerbates the pain.  Sharp pain.  Radiates to hands.  She has associated numbness.  Ibu does not help. Narco, flexaril helps. Denies falls. Pain limits from doing ADLs. Pt notes she had similar symptoms on left side before first fusion. Pt relocated 12/2014 from [place], [place] where she was being followed by Pain clinic.    Last clinic visit 02/07/17. Since that time, she had good benefit with increase in Gabapentin.  She notices some benefit with Diclofenac. Flexaril works for her as well.  She notes the combination is very effective. She had an xray.  She has been doing well with CTS brace at night. BP remains elevated, but states it is WNL at home.  She continues to smoke 1/2 PPD.   Pain Inventory Average Pain 7 Pain Right Now 4 My pain is constant, burning, dull, stabbing, tingling and aching  In the last 24 hours, has pain interfered with the following? General activity 4 Relation with others 2 Enjoyment of life 4 What TIME of day is your pain at its worst? varies Sleep (in general) Good  Pain is worse with: some activites Pain improves with: medication and TENS Relief from Meds: 9  Mobility walk without assistance how many minutes can you walk? 10-60 ability to climb steps?  yes do you drive?  yes Do you have any goals in this area?  yes  Function retired  Neuro/Psych weakness numbness tingling spasms  Prior Studies Any changes since last visit?  no  Physicians involved in your care Any changes since last visit?  no   No pertinent family  history of neck/shoulder pain. Social History   Social History  . Marital status: Unknown    Spouse name: N/A  . Number of children: N/A  . Years of education: N/A   Social History Main Topics  . Smoking status: Current Some Day Smoker    Packs/day: 1.00    Years: 33.00    Types: Cigarettes  . Smokeless tobacco: Never Used  . Alcohol use Yes     Comment: 1-2 wk  . Drug use: No  . Sexual activity: Not Asked   Other Topics Concern  . None   Social History Narrative  . None   Past surgical history: Cervical fusion. Past medical history: HTN, anemia, B12 deficiency, rheumatic fever, chronic pain BP (!) 161/83 (BP Location: Right Arm, Patient Position: Sitting, Cuff Size: Normal)   Pulse 86   Resp 14   SpO2 96%   Opioid Risk Score:   Fall Risk Score:  `1  Depression screen PHQ 2/9  Depression screen [facility] 2/9 12/12/2016 11/08/2016 10/11/2016  Decreased Interest 0 0 0  Down, Depressed, Hopeless 0 0 0  PHQ - 2 Score 0 0 0  Altered sleeping - - 0  Tired, decreased energy - - 0  Change in appetite - - 0  Feeling bad or failure about yourself  - - 0  Trouble concentrating - - 0  Moving slowly or fidgety/restless - - 0  Suicidal thoughts - - 0  PHQ-9  Score - - 0  Difficult doing work/chores - - Not difficult at all   Review of Systems  Constitutional: Positive for unexpected weight change.       Weight gain in relatively short amount of time  HENT: Negative.   Eyes: Negative.   Respiratory: Negative.   Cardiovascular: Negative.   Gastrointestinal: Negative.   Endocrine: Negative.   Genitourinary: Negative.   Musculoskeletal: Positive for arthralgias, back pain, myalgias and neck pain.       Spasms  Skin: Negative.   Allergic/Immunologic: Negative.   Neurological: Positive for weakness and numbness.       Tingling   Hematological: Negative.   Psychiatric/Behavioral: Negative.   All other systems reviewed and are negative.     Objective:   Physical Exam Gen:  NAD. Vital signs reviewed HENT: Normocephalic, Atraumatic. Lymphadenitis right neck Eyes: EOMI. No discharge.  Cardio: RRR. No JVD. Pulm: B/l clear to auscultation.  Effort normal Abd: Soft, BS+ MSK:  Gait WNL.   +TTP along right traps, and right shoulder.    No edema.  Neuro:   Sensation diminished to light touch in hand b/l R>L  Strength  5/5 in all LE myotomes (Right side limited by pain) Skin: Warm and Dry. Intact    Assessment & Plan:  66 y/o female with pmh/psh of  HTN, anemia, B12 deficiency, rheumatic fever, cervical fusion present for follow up of right shoulder pain.    1. Chronic right shoulder and neck pain   Xrays reports reviewed from [facility], C3, C6 foraminal stenosis and fusion  NCS/EMG showing subacute/chronic C5 radic.   Heat/Cold, Baclofen, and Mobic ineffective, unable to tolerate Cymbalta  Xray reviewed, showing mild chronic changes  Cont TENS  Cont Gabapentin 600 TID  Cont diclofenac 50 TID.  Cont Flexaril 10 TID   Cont PT with traction  Will consider Voltaren gel and Lidoderm patch in future after imaging of shoulder, await imaging   2. Myalgia and muscle spasms  Will consider Trigger point injections in future  See #1  3. Right CTS  See #1  Cont brace qhs  4. HTN  Sill elevated, but pt states WNL at home  Being adjusted by PCP   5. Tobacco abuse  Smoking 1/2 PPD  Con encouraged weaning

## 2017-04-03 ENCOUNTER — Other Ambulatory Visit: Payer: Self-pay | Admitting: Physical Medicine & Rehabilitation

## 2017-04-26 ENCOUNTER — Encounter: Payer: Self-pay | Admitting: Physical Medicine & Rehabilitation

## 2017-04-26 ENCOUNTER — Encounter: Payer: Medicare Other | Attending: Physical Medicine & Rehabilitation | Admitting: Physical Medicine & Rehabilitation

## 2017-04-26 VITALS — BP 137/80 | HR 80 | Resp 16 | Ht 60.0 in | Wt 161.8 lb

## 2017-04-26 DIAGNOSIS — R200 Anesthesia of skin: Secondary | ICD-10-CM | POA: Diagnosis not present

## 2017-04-26 DIAGNOSIS — E538 Deficiency of other specified B group vitamins: Secondary | ICD-10-CM | POA: Insufficient documentation

## 2017-04-26 DIAGNOSIS — M5412 Radiculopathy, cervical region: Secondary | ICD-10-CM

## 2017-04-26 DIAGNOSIS — M898X1 Other specified disorders of bone, shoulder: Secondary | ICD-10-CM

## 2017-04-26 DIAGNOSIS — F17210 Nicotine dependence, cigarettes, uncomplicated: Secondary | ICD-10-CM | POA: Diagnosis not present

## 2017-04-26 DIAGNOSIS — G5601 Carpal tunnel syndrome, right upper limb: Secondary | ICD-10-CM | POA: Diagnosis not present

## 2017-04-26 DIAGNOSIS — D649 Anemia, unspecified: Secondary | ICD-10-CM | POA: Insufficient documentation

## 2017-04-26 DIAGNOSIS — R202 Paresthesia of skin: Secondary | ICD-10-CM

## 2017-04-26 DIAGNOSIS — Z720 Tobacco use: Secondary | ICD-10-CM

## 2017-04-26 DIAGNOSIS — M792 Neuralgia and neuritis, unspecified: Secondary | ICD-10-CM

## 2017-04-26 DIAGNOSIS — I10 Essential (primary) hypertension: Secondary | ICD-10-CM | POA: Diagnosis not present

## 2017-04-26 DIAGNOSIS — G894 Chronic pain syndrome: Secondary | ICD-10-CM | POA: Diagnosis not present

## 2017-04-26 DIAGNOSIS — M542 Cervicalgia: Secondary | ICD-10-CM | POA: Insufficient documentation

## 2017-04-26 DIAGNOSIS — M791 Myalgia, unspecified site: Secondary | ICD-10-CM

## 2017-04-26 DIAGNOSIS — Z981 Arthrodesis status: Secondary | ICD-10-CM | POA: Diagnosis not present

## 2017-04-26 DIAGNOSIS — M25511 Pain in right shoulder: Secondary | ICD-10-CM | POA: Diagnosis not present

## 2017-04-26 DIAGNOSIS — M4802 Spinal stenosis, cervical region: Secondary | ICD-10-CM | POA: Diagnosis not present

## 2017-04-26 DIAGNOSIS — G8929 Other chronic pain: Secondary | ICD-10-CM

## 2017-04-26 MED ORDER — LIDOCAINE 5 % EX PTCH: 1.0000 | MEDICATED_PATCH | CUTANEOUS | 0 refills | Status: DC

## 2017-04-26 NOTE — Progress Notes (Addendum)
Subjective:    Patient ID: Amy Bruce, female    DOB: 08-Nov-1950, 66 y.o.   MRN: [id number]  HPI  66 y/o female with pmh/psh ofHTN, anemia, B12 deficiency, rheumatic fever, cervical fusion present for follow up for right shoulder pain.   Initially stated: Present since fusion 1991, getting worse in last couple weeks.  She fractured her humerus in 2016 and states she was slinged.  No alleviating factors. Everything exacerbates the pain.  Sharp pain.  Radiates to hands.  She has associated numbness.  Ibu does not help. Narco, flexaril helps. Denies falls. Pain limits from doing ADLs. Pt notes she had similar symptoms on left side before first fusion. Pt relocated 12/2014 from [place], [place] where she was being followed by Pain clinic.    Last clinic visit 02/25/17. Since that time, she continues to take her medications as prescribed.  She has completed PT.  She did obtain her shoulder xray.  Her BP has improved.  She is still smoking 1/2 PPD.     Pain Inventory Average Pain 3 Pain Right Now 3 My pain is burning  In the last 24 hours, has pain interfered with the following? General activity 0 Relation with others 0 Enjoyment of life 0 What TIME of day is your pain at its worst? varies Sleep (in general) NA  Pain is worse with: some activites Pain improves with: medication and TENS Relief from Meds: 9  Mobility walk without assistance how many minutes can you walk? 5 ability to climb steps?  yes do you drive?  yes Do you have any goals in this area?  yes  Function not employed: date last employed n/a retired  Neuro/Psych weakness numbness tingling spasms  Prior Studies Any changes since last visit?  no bone scan x-rays nerve study  Physicians involved in your care Any changes since last visit?  no Primary care Dr. London Pepper   No pertinent family history of neck/shoulder pain. Social History   Social History  . Marital status: Unknown    Spouse name:  N/A  . Number of children: N/A  . Years of education: N/A   Social History Main Topics  . Smoking status: Current Some Day Smoker    Packs/day: 1.00    Years: 33.00    Types: Cigarettes  . Smokeless tobacco: Never Used  . Alcohol use Yes     Comment: 1-2 wk  . Drug use: No  . Sexual activity: Not Asked   Other Topics Concern  . None   Social History Narrative  . None   Past surgical history: Cervical fusion. Past medical history: HTN, anemia, B12 deficiency, rheumatic fever, chronic pain BP 137/80   Pulse 80   Resp 16   Ht 5' (1.524 m)   Wt 161 lb 12.8 oz (73.4 kg)   SpO2 97%   BMI 31.60 kg/m   Opioid Risk Score:   Fall Risk Score:  `1  Depression screen PHQ 2/9  Depression screen [facility] 2/9 12/12/2016 11/08/2016 10/11/2016  Decreased Interest 0 0 0  Down, Depressed, Hopeless 0 0 0  PHQ - 2 Score 0 0 0  Altered sleeping - - 0  Tired, decreased energy - - 0  Change in appetite - - 0  Feeling bad or failure about yourself  - - 0  Trouble concentrating - - 0  Moving slowly or fidgety/restless - - 0  Suicidal thoughts - - 0  PHQ-9 Score - - 0  Difficult doing work/chores - -  Not difficult at all   Review of Systems  Constitutional: Positive for unexpected weight change.       Weight gain in relatively short amount of time  HENT: Negative.   Eyes: Negative.   Respiratory: Negative.   Cardiovascular: Negative.   Gastrointestinal: Negative.   Endocrine: Negative.   Genitourinary: Negative.   Musculoskeletal: Positive for arthralgias, back pain, myalgias and neck pain.       Spasms  Skin: Negative.   Allergic/Immunologic: Negative.   Neurological: Positive for weakness and numbness.       Tingling Spasms  Hematological: Negative.   Psychiatric/Behavioral: Negative.   All other systems reviewed and are negative.     Objective:   Physical Exam Gen: NAD. Vital signs reviewed HENT: Normocephalic, Atraumatic. Eyes: EOMI. No discharge.  Cardio: RRR. No  JVD. Pulm: B/l clear to auscultation.  Effort normal Abd: Soft, BS+ MSK:  Gait WNL.   +TTP along right traps, and right scapula.    No edema.  Neuro:   Sensation diminished to light touch in hand b/l R>L  Strength  5/5 in all LE myotomes (Right side limited by pain) Skin: Warm and Dry. Intact    Assessment & Plan:  66 y/o female with pmh/psh of  HTN, anemia, B12 deficiency, rheumatic fever, cervical fusion present for follow up of right shoulder pain.    1. Chronic right shoulder/scapular and neck pain and neuropathic pain  Xrays reports reviewed from [facility], C3, C6 foraminal stenosis and fusion  NCS/EMG showing subacute/chronic C5 radic.   Heat/Cold, Baclofen, and Mobic ineffective, unable to tolerate Cymbalta  Xray reviewed, showing mild chronic changes  Cont HEP, completed PT  Cont TENS  Cont Gabapentin 600 TID  Cont diclofenac 50 TID.  Cont Flexaril 10 TID   Will order Lidoderm patch for left scapula  No pronounced pain in right shoulder - may consider injection in future   2. Myalgia and muscle spasms  Will consider Trigger point injections in future  See #1  3. Right CTS  See #1  Cont brace qhs  4. HTN  Relatively controlled today  5. Tobacco abuse  Smoking 1/2 PPD  Cont encouraged weaning

## 2017-04-27 ENCOUNTER — Other Ambulatory Visit: Payer: Self-pay | Admitting: Physical Medicine & Rehabilitation

## 2017-05-09 ENCOUNTER — Ambulatory Visit: Payer: Medicare HMO | Admitting: Physical Medicine & Rehabilitation

## 2017-05-22 ENCOUNTER — Ambulatory Visit: Payer: Medicare Other | Admitting: Podiatry

## 2017-06-05 ENCOUNTER — Ambulatory Visit: Payer: Medicare Other | Admitting: Podiatry

## 2017-06-12 ENCOUNTER — Ambulatory Visit (INDEPENDENT_AMBULATORY_CARE_PROVIDER_SITE_OTHER): Payer: Medicare Other | Admitting: Podiatry

## 2017-06-12 ENCOUNTER — Encounter: Payer: Self-pay | Admitting: Podiatry

## 2017-06-12 ENCOUNTER — Telehealth: Payer: Self-pay | Admitting: *Deleted

## 2017-06-12 ENCOUNTER — Other Ambulatory Visit: Payer: Self-pay | Admitting: Podiatry

## 2017-06-12 ENCOUNTER — Ambulatory Visit (INDEPENDENT_AMBULATORY_CARE_PROVIDER_SITE_OTHER): Payer: Medicare Other

## 2017-06-12 VITALS — BP 144/92 | HR 103 | Resp 16

## 2017-06-12 DIAGNOSIS — M79672 Pain in left foot: Secondary | ICD-10-CM | POA: Diagnosis not present

## 2017-06-12 DIAGNOSIS — M79671 Pain in right foot: Secondary | ICD-10-CM | POA: Diagnosis not present

## 2017-06-12 DIAGNOSIS — M779 Enthesopathy, unspecified: Secondary | ICD-10-CM | POA: Diagnosis not present

## 2017-06-12 DIAGNOSIS — I999 Unspecified disorder of circulatory system: Secondary | ICD-10-CM

## 2017-06-12 NOTE — Progress Notes (Signed)
Subjective:    Patient ID: Amy Bruce, female   DOB: 66 y.o.   MRN: [id number]   HPI patient presents stating she has a lot of pain in her feet bilateral states it's been worse over the last year and she had previous treatment by Dr. with injections which did nothing to help her and she also is getting cramping in her legs and has long-term history of smoking for at least 35 years. States that she cannot walk distances without having to stop and the pain is becoming more intense    Review of Systems  All other systems reviewed and are negative.       Objective:  Physical Exam  Constitutional: She appears well-developed and well-nourished.  Pulmonary/Chest: Effort normal.  Musculoskeletal: Normal range of motion.  Neurological: She is alert.  Skin: Skin is dry.  Nursing note and vitals reviewed.  vascular status significantly diminished with no palpable PT or DP pulses. Patient is noted diminished digital perfusion to digits 1 through 5 bilateral and pain that's located through the feet with no area of specific discomfort. Patient is mild neurological loss both sharp [place] vibratory     Assessment:   Strong possibility for vascular disease leading to the symptoms this patient is experiencing     Plan:    H&P x-rays reviewed and ABI was done indicating vascular disease. I'm referring to vascular and interventional cardiologist for evaluation treatment and I do not recommend any treatment until that stop  X-rays indicate spur but no indications of advanced other pathology

## 2017-06-12 NOTE — Telephone Encounter (Signed)
Amy Bruce - [facility] schedule pt in [facility] for arterial duplex 06/24/2017 at 1:30pm to arrive 1:15pm and appt with Dr. Fletcher Anon 07/09/2017 at 8:40am to arrive 8:20am. Appts called to pt and gave [place] [phone]. Faxed to [facility].

## 2017-06-19 ENCOUNTER — Other Ambulatory Visit: Payer: Self-pay | Admitting: Podiatry

## 2017-06-19 DIAGNOSIS — I999 Unspecified disorder of circulatory system: Secondary | ICD-10-CM

## 2017-06-24 ENCOUNTER — Ambulatory Visit (HOSPITAL_COMMUNITY)
Admission: RE | Admit: 2017-06-24 | Discharge: 2017-06-24 | Disposition: A | Discharge status: Home / Self Care | Payer: Medicare Other | Source: Ambulatory Visit | Attending: Cardiology | Admitting: Cardiology

## 2017-06-24 DIAGNOSIS — I999 Unspecified disorder of circulatory system: Secondary | ICD-10-CM | POA: Insufficient documentation

## 2017-06-26 ENCOUNTER — Encounter: Payer: Medicare Other | Attending: Physical Medicine & Rehabilitation | Admitting: Physical Medicine & Rehabilitation

## 2017-06-26 ENCOUNTER — Encounter: Payer: Self-pay | Admitting: Physical Medicine & Rehabilitation

## 2017-06-26 VITALS — BP 137/68 | HR 84 | Resp 14

## 2017-06-26 DIAGNOSIS — R202 Paresthesia of skin: Secondary | ICD-10-CM

## 2017-06-26 DIAGNOSIS — G8929 Other chronic pain: Secondary | ICD-10-CM

## 2017-06-26 DIAGNOSIS — M791 Myalgia, unspecified site: Secondary | ICD-10-CM | POA: Diagnosis not present

## 2017-06-26 DIAGNOSIS — E538 Deficiency of other specified B group vitamins: Secondary | ICD-10-CM | POA: Diagnosis not present

## 2017-06-26 DIAGNOSIS — M25511 Pain in right shoulder: Secondary | ICD-10-CM

## 2017-06-26 DIAGNOSIS — Z981 Arthrodesis status: Secondary | ICD-10-CM | POA: Diagnosis not present

## 2017-06-26 DIAGNOSIS — M5412 Radiculopathy, cervical region: Secondary | ICD-10-CM

## 2017-06-26 DIAGNOSIS — M542 Cervicalgia: Secondary | ICD-10-CM | POA: Diagnosis not present

## 2017-06-26 DIAGNOSIS — Z720 Tobacco use: Secondary | ICD-10-CM | POA: Diagnosis not present

## 2017-06-26 DIAGNOSIS — F17210 Nicotine dependence, cigarettes, uncomplicated: Secondary | ICD-10-CM | POA: Insufficient documentation

## 2017-06-26 DIAGNOSIS — M4802 Spinal stenosis, cervical region: Secondary | ICD-10-CM | POA: Diagnosis not present

## 2017-06-26 DIAGNOSIS — I10 Essential (primary) hypertension: Secondary | ICD-10-CM | POA: Diagnosis not present

## 2017-06-26 DIAGNOSIS — G5601 Carpal tunnel syndrome, right upper limb: Secondary | ICD-10-CM | POA: Diagnosis not present

## 2017-06-26 DIAGNOSIS — G894 Chronic pain syndrome: Secondary | ICD-10-CM | POA: Diagnosis not present

## 2017-06-26 DIAGNOSIS — R200 Anesthesia of skin: Secondary | ICD-10-CM | POA: Diagnosis not present

## 2017-06-26 DIAGNOSIS — M792 Neuralgia and neuritis, unspecified: Secondary | ICD-10-CM

## 2017-06-26 DIAGNOSIS — M898X1 Other specified disorders of bone, shoulder: Secondary | ICD-10-CM

## 2017-06-26 DIAGNOSIS — D649 Anemia, unspecified: Secondary | ICD-10-CM | POA: Diagnosis not present

## 2017-06-26 NOTE — Progress Notes (Signed)
Subjective:    Patient ID: Amy Bruce, female    DOB: Feb 03, 1951, 66 y.o.   MRN: [id number]  HPI  66 y/o female with pmh/psh ofHTN, anemia, B12 deficiency, rheumatic fever, cervical fusion present for follow up for right shoulder pain.   Initially stated: Present since fusion 1991, getting worse in last couple weeks.  She fractured her humerus in 2016 and states she was slinged.  No alleviating factors. Everything exacerbates the pain.  Sharp pain.  Radiates to hands.  She has associated numbness.  Ibu does not help. Narco, flexaril helps. Denies falls. Pain limits from doing ADLs. Pt notes she had similar symptoms on left side before first fusion. Pt relocated 12/2014 from [place], [place] where she was being followed by Pain clinic.    Last clinic visit 04/26/17. Since that time, she is taking all medications, but did not receive her Lidoderm patch.  BP is controlled today.  She continues to smoke, but states she feels like she "may start to think about" quitting.    Pain Inventory Average Pain 7 Pain Right Now 1 My pain is burning  In the last 24 hours, has pain interfered with the following? General activity 2 Relation with others 0 Enjoyment of life 2 What TIME of day is your pain at its worst? varies Sleep (in general) Good  Pain is worse with: some activites Pain improves with: medication and TENS Relief from Meds: 9  Mobility walk without assistance how many minutes can you walk? 5 ability to climb steps?  yes do you drive?  yes Do you have any goals in this area?  yes  Function not employed: date last employed n/a retired  Neuro/Psych weakness numbness tingling trouble walking spasms  Prior Studies Any changes since last visit?  no  Physicians involved in your care Any changes since last visit?  no   No pertinent family history of neck/shoulder pain. Social History   Social History  . Marital status: Unknown    Spouse name: N/A  . Number of  children: N/A  . Years of education: N/A   Social History Main Topics  . Smoking status: Current Some Day Smoker    Packs/day: 1.00    Years: 33.00    Types: Cigarettes  . Smokeless tobacco: Never Used  . Alcohol use Yes     Comment: 1-2 wk  . Drug use: No  . Sexual activity: Not Asked   Other Topics Concern  . None   Social History Narrative  . None   Past surgical history: Cervical fusion. Past medical history: HTN, anemia, B12 deficiency, rheumatic fever, chronic pain BP 137/68 (BP Location: Right Arm, Patient Position: Sitting, Cuff Size: Normal)   Pulse 84   Resp 14   SpO2 95%   Opioid Risk Score:   Fall Risk Score:  `1  Depression screen PHQ 2/9  Depression screen [facility] 2/9 12/12/2016 11/08/2016 10/11/2016  Decreased Interest 0 0 0  Down, Depressed, Hopeless 0 0 0  PHQ - 2 Score 0 0 0  Altered sleeping - - 0  Tired, decreased energy - - 0  Change in appetite - - 0  Feeling bad or failure about yourself  - - 0  Trouble concentrating - - 0  Moving slowly or fidgety/restless - - 0  Suicidal thoughts - - 0  PHQ-9 Score - - 0  Difficult doing work/chores - - Not difficult at all   Review of Systems  Constitutional: Positive for unexpected weight  change.       Weight gain in relatively short amount of time  HENT: Negative.   Eyes: Negative.   Respiratory: Negative.   Cardiovascular: Negative.   Gastrointestinal: Negative.   Endocrine: Negative.   Genitourinary: Negative.   Musculoskeletal: Positive for arthralgias, back pain, myalgias and neck pain.       Spasms  Skin: Negative.   Allergic/Immunologic: Negative.   Neurological: Positive for weakness and numbness.       Tingling   Hematological: Negative.   Psychiatric/Behavioral: Negative.   All other systems reviewed and are negative.     Objective:   Physical Exam Gen: NAD. Vital signs reviewed HENT: Normocephalic, Atraumatic. Eyes: EOMI. No discharge.  Cardio: RRR. No JVD. Pulm: B/l clear to  auscultation.  Effort normal Abd: Soft, BS+ MSK:  Gait WNL.   Minimal TTP along right traps, and right scapula.    No edema.  Neuro:   Sensation diminished to light touch in hand b/l R>L  Strength  5/5 in all LE myotomes (Right side slightly limited by pain) Skin: Warm and Dry. Intact    Assessment & Plan:  66 y/o female with pmh/psh of  HTN, anemia, B12 deficiency, rheumatic fever, cervical fusion present for follow up of right shoulder pain.    1. 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   Heat/Cold, Baclofen, and Mobic ineffective, unable to tolerate Cymbalta  Xray reviewed, showing mild chronic changes  Cont HEP, completed PT  Cont TENS  Cont Gabapentin 600 TID  Cont diclofenac 50 TID, will consider change based on Vascular consultation  Cont Flexaril 10 TID   Awaiting approval for Lidoderm patch  No pronounced pain in right shoulder - may consider injection in future - educated on injection   2. Myalgia and muscle spasms  Will consider Trigger point injections in future  See #1  3. Right CTS  See #1  Cont brace qhs  4. HTN  Relatively controlled today  5. Tobacco abuse  Smoking 1/2 PPD  Cont encouraged weaning, discussed plan, pt plans to quit tomorrow  6. Hx of post herpatic neuralgia  See #1  7. PVD  Currently awaiting consultation

## 2017-07-09 ENCOUNTER — Ambulatory Visit (INDEPENDENT_AMBULATORY_CARE_PROVIDER_SITE_OTHER): Payer: Medicare Other | Admitting: Cardiovascular Disease

## 2017-07-09 ENCOUNTER — Encounter: Payer: Self-pay | Admitting: Cardiovascular Disease

## 2017-07-09 VITALS — BP 134/84 | HR 76 | Ht 60.0 in | Wt 166.0 lb

## 2017-07-09 DIAGNOSIS — E785 Hyperlipidemia, unspecified: Secondary | ICD-10-CM | POA: Diagnosis not present

## 2017-07-09 DIAGNOSIS — I739 Peripheral vascular disease, unspecified: Secondary | ICD-10-CM | POA: Diagnosis not present

## 2017-07-09 DIAGNOSIS — Z720 Tobacco use: Secondary | ICD-10-CM

## 2017-07-09 MED ORDER — CILOSTAZOL 50 MG PO TABS: 50.0000 mg | ORAL_TABLET | Freq: Two times a day (BID) | ORAL | 3 refills | Status: DC

## 2017-07-09 NOTE — Progress Notes (Signed)
Cardiology Office Note   Date:  07/09/2017   ID:  Bhumi, Godbey Dec 15, 1950, MRN [id number]  PCP:  London Pepper, MD  Cardiologist:   Kathlyn Sacramento, MD   No chief complaint on file.     History of Present Illness: Amy Bruce is a 66 y.o. female who was referred by Dr. Paulla Dolly for evaluation and management of peripheral arterial disease.  She has prolonged history of tobacco use, essential hypertension and hyperlipidemia.  She reports previous rheumatic fever when she was 4 with a heart murmur but that has resolved in recent years.  She smokes half a pack per day and has been doing so since she was 66 years old. CT scan for lung cancer screening in May 2018 showed evidence of calcified atherosclerosis affecting the aorta and coronary arteries. She has prolonged history of plantar fasciitis with significant pain at the bottom of her feet with walking.  In addition to that, she noticed bilateral calf discomfort after walking about half a block which is worse on the right side.  This forces her to stop and rest for 2-3 minutes before she can resume walking.  She has no rest pain or lower extremity ulceration. She underwent vascular studies which showed an ABI of 0.54 on the right and 0.81 on the left. Duplex showed mild right common femoral artery stenosis with occluded mid SFA.  On the left side, there was mild disease affecting the SFA and common femoral artery.  There is no family history of coronary artery disease.  Past Medical History:  Diagnosis Date  . Hyperlipidemia   . Hypertension   . PAD (peripheral artery disease) ([place])   . Tobacco abuse     Past Surgical History:  Procedure Laterality Date  . ABDOMINAL HYSTERECTOMY  1999  . [place]   C5/6 6/7  . Left axillary cyst removal 1989    . TONSILECTOMY, ADENOIDECTOMY, BILATERAL MYRINGOTOMY AND TUBES  1981  . TUBAL LIGATION  1981     Current Outpatient Prescriptions  Medication Sig  Dispense Refill  . aspirin EC 81 MG tablet Take 81 mg by mouth daily.    Marland Kitchen atorvastatin (LIPITOR) 20 MG tablet 1 tablet    . Cyanocobalamin (VITAMIN B 12) 100 MCG LOZG 1 capsule with food or milk    . cyclobenzaprine (FLEXERIL) 10 MG tablet TAKE 1 TABLET BY MOUTH THREE TIMES DAILY AS NEEDED FOR MUSCLE SPASMS. 90 tablet 2  . diclofenac (VOLTAREN) 50 MG EC tablet TAKE 1 TABLET BY MOUTH THREE TIMES DAILY 90 tablet 2  . gabapentin (NEURONTIN) 600 MG tablet TAKE 1 TABLET BY MOUTH THREE TIMES DAILY 90 tablet 2  . lidocaine (LIDODERM) 5 % Place 1 patch onto the skin daily. Remove & Discard patch within 12 hours or as directed by MD 30 patch 0  . lisinopril (PRINIVIL,ZESTRIL) 10 MG tablet 1 tablet    . VENTOLIN HFA 108 (90 Base) MCG/ACT inhaler INL 1-2 PFS PO Q 4-6 H PRN  1   No current facility-administered medications for this visit.     Allergies:   Carisoprodol and Duloxetine hcl    Social History:  The patient  reports that she has been smoking Cigarettes.  She has a 33.00 pack-year smoking history. She has never used smokeless tobacco. She reports that she drinks alcohol. She reports that she does not use drugs.   Family History:  The patient's family history includes Breast cancer in her maternal aunt.  ROS:  Please see the history of present illness.   Otherwise, review of systems are positive for none.   All other systems are reviewed and negative.    PHYSICAL EXAM: VS:  BP 134/84   Pulse 76   Ht 5' (1.524 m)   Wt 166 lb (75.3 kg)   BMI 32.42 kg/m  , BMI Body mass index is 32.42 kg/m. GEN: Well nourished, well developed, in no acute distress  HEENT: normal  Neck: no JVD, carotid bruits, or masses Cardiac: RRR; no murmurs, rubs, or gallops,no edema  Respiratory:  clear to auscultation bilaterally, normal work of breathing GI: soft, nontender, nondistended, + BS MS: no deformity or atrophy  Skin: warm and dry, no rash Neuro:  Strength and sensation are intact Psych:  euthymic mood, full affect Vascular: Femoral pulses are +1 bilaterally.  Distal pulses are not palpable   EKG:  EKG is ordered today. The ekg ordered today demonstrates normal sinus rhythm with no significant ST or T wave changes.   Recent Labs: No results found for requested labs within last 8760 hours.    Lipid Panel No results found for: CHOL, TRIG, HDL, CHOLHDL, VLDL, LDLCALC, LDLDIRECT    Wt Readings from Last 3 Encounters:  07/09/17 166 lb (75.3 kg)  04/26/17 161 lb 12.8 oz (73.4 kg)  02/07/17 147 lb 9.6 oz (67 kg)       No flowsheet data found.    ASSESSMENT AND PLAN:  1.  Peripheral arterial disease with severe bilateral leg claudication worse on the right side: No evidence of critical limb ischemia.  I discussed with her the natural history and management of claudication.  I discussed the importance of controlling her risk factors especially smoking cessation. I advised her to continue aspirin 81 mg once daily.  She has no symptoms suggestive of obstructive coronary artery disease. She is already on a statin. I elected to start her on cilostazol 50 mg twice daily and I instructed her to start a daily walking exercise program. Reevaluate symptoms in 3 months.  If there is no significant improvement, angiography can be considered.  2.  Plantar fasciitis: Could be contributing to some of her symptoms especially the discomfort in her feet.  Continue to follow up with Dr. Paulla Dolly.  3.  Tobacco use: I had a prolonged discussion with her about the importance of smoking cessation in order to prevent progression to critical limb ischemia.  She wants to quit smoking in her own as she was able to do this in the past.  4.  Hyperlipidemia: Continue treatment with atorvastatin with a target LDL of less than 70.    Disposition:   FU with me in 3 months  Signed,  Kathlyn Sacramento, MD  07/09/2017 9:14 AM    [place]

## 2017-07-09 NOTE — Patient Instructions (Addendum)
Medication Instructions:  START- Pletal(Cilostazol) 50 mg twice a day  If you need a refill on your cardiac medications before your next appointment, please call your pharmacy.  Labwork: None Ordered   Testing/Procedures: None Ordered   Follow-Up: Your physician wants you to follow-up in: 3 Months.   Thank you for choosing [facility]!!    Health Risks of Smoking Smoking cigarettes is very bad for your health. Tobacco smoke has over 200 known poisons in it. It contains the poisonous gases nitrogen oxide and carbon monoxide. There are over 60 chemicals in tobacco smoke that cause cancer. Smoking is difficult to quit because a chemical in tobacco, called nicotine, causes addiction or dependence. When you smoke and inhale, nicotine is absorbed rapidly into the bloodstream through your lungs. Both inhaled and non-inhaled nicotine may be addictive. What are the risks of cigarette smoke? Cigarette smokers have an increased risk of many serious medical problems, including:  Lung cancer.  Lung disease, such as pneumonia, bronchitis, and emphysema.  Chest pain (angina) and heart attack because the heart is not getting enough oxygen.  Heart disease and peripheral blood vessel disease.  High blood pressure (hypertension).  Stroke.  Oral cancer, including cancer of the lip, mouth, or voice box.  Bladder cancer.  Pancreatic cancer.  Cervical cancer.  Pregnancy complications, including premature birth.  Stillbirths and smaller newborn babies, birth defects, and genetic damage to sperm.  Early menopause.  Lower estrogen level for women.  Infertility.  Facial wrinkles.  Blindness.  Increased risk of broken bones (fractures).  Senile dementia.  Stomach ulcers and internal bleeding.  Delayed wound healing and increased risk of complications during surgery.  Even smoking lightly shortens your life expectancy by several years.  Because of secondhand  smoke exposure, children of smokers have an increased risk of the following:  Sudden infant death syndrome (SIDS).  Respiratory infections.  Lung cancer.  Heart disease.  Ear infections.  What are the benefits of quitting? There are many health benefits of quitting smoking. Here are some of them:  Within days of quitting smoking, your risk of having a heart attack decreases, your blood flow improves, and your lung capacity improves. Blood pressure, pulse rate, and breathing patterns start returning to normal soon after quitting.  Within months, your lungs may clear up completely.  Quitting for 10 years reduces your risk of developing lung cancer and heart disease to almost that of a nonsmoker.  People who quit may see an improvement in their overall quality of life.  How do I quit smoking? Smoking is an addiction with both physical and psychological effects, and longtime habits can be hard to change. Your health care provider can recommend:  Programs and community resources, which may include group support, education, or talk therapy.  Prescription medicines to help reduce cravings.  Nicotine replacement products, such as patches, gum, and nasal sprays. Use these products only as directed. Do not replace cigarette smoking with electronic cigarettes, which are commonly called e-cigarettes. The safety of e-cigarettes is not known, and some may contain harmful chemicals.  A combination of two or more of these methods.  Where to find more information:  American Lung Association: www.lung.org  American Cancer Society: www.cancer.org Summary  Smoking cigarettes is very bad for your health. Cigarette smokers have an increased risk of many serious medical problems, including several cancers, heart disease, and stroke.  Smoking is an addiction with both physical and psychological effects, and longtime habits can be hard to  change.  By stopping right away, you can greatly reduce  the risk of medical problems for you and your family.  To help you quit smoking, your health care provider can recommend programs, community resources, prescription medicines, and nicotine replacement products such as patches, gum, and nasal sprays. This information is not intended to replace advice given to you by your health care provider. Make sure you discuss any questions you have with your health care provider. Document Released: 10/04/2004 Document Revised: 08/31/2016 Document Reviewed: 08/31/2016 Elsevier Interactive Patient Education  2017 [employer].

## 2017-07-24 ENCOUNTER — Encounter: Payer: Self-pay | Admitting: Podiatry

## 2017-07-24 ENCOUNTER — Ambulatory Visit (INDEPENDENT_AMBULATORY_CARE_PROVIDER_SITE_OTHER): Payer: Medicare Other | Admitting: Podiatry

## 2017-07-24 DIAGNOSIS — I999 Unspecified disorder of circulatory system: Secondary | ICD-10-CM

## 2017-07-24 DIAGNOSIS — M722 Plantar fascial fibromatosis: Secondary | ICD-10-CM

## 2017-07-24 MED ORDER — TRIAMCINOLONE ACETONIDE 10 MG/ML IJ SUSP
10.0000 mg | Freq: Once | INTRAMUSCULAR | Status: AC
  Administered 2017-07-24: 10 mg

## 2017-07-24 NOTE — Progress Notes (Signed)
Subjective:    Patient ID: Amy Bruce, female   DOB: 66 y.o.   MRN: [id number]   HPI patient has been to the vascular doctor who has diagnosed her with vascular disease but is trying to avoid surgery. He started her on medicine which helps a little bit and she needs to walk but she's having a lot of pain in the arches of both feet and has inability to be as active which she would like    ROS      Objective:  Physical Exam neurovascular was found to be unchanged from previous visit with exquisite discomfort in the plantar arch of both feet     Assessment:   Combination of vascular disease and fasciitis bilateral      Plan:   H&P condition reviewed and careful mid arch fascial injection administered 3 mg Kenalog 5 mg Xylocaine and dispensed night splint to try to stretch her feet. I do not want to utilize ice therapy and ultimately she probably will need some kind of stents

## 2017-08-05 ENCOUNTER — Other Ambulatory Visit: Payer: Self-pay | Admitting: Physical Medicine & Rehabilitation

## 2017-08-06 ENCOUNTER — Other Ambulatory Visit: Payer: Self-pay | Admitting: Physical Medicine & Rehabilitation

## 2017-08-07 ENCOUNTER — Encounter: Payer: Self-pay | Admitting: Physical Medicine & Rehabilitation

## 2017-08-07 ENCOUNTER — Encounter: Payer: Medicare Other | Attending: Physical Medicine & Rehabilitation | Admitting: Physical Medicine & Rehabilitation

## 2017-08-07 VITALS — BP 129/68 | HR 96

## 2017-08-07 DIAGNOSIS — Z981 Arthrodesis status: Secondary | ICD-10-CM | POA: Insufficient documentation

## 2017-08-07 DIAGNOSIS — G8929 Other chronic pain: Secondary | ICD-10-CM | POA: Diagnosis not present

## 2017-08-07 DIAGNOSIS — M791 Myalgia, unspecified site: Secondary | ICD-10-CM | POA: Diagnosis not present

## 2017-08-07 DIAGNOSIS — G894 Chronic pain syndrome: Secondary | ICD-10-CM | POA: Diagnosis not present

## 2017-08-07 DIAGNOSIS — E538 Deficiency of other specified B group vitamins: Secondary | ICD-10-CM | POA: Insufficient documentation

## 2017-08-07 DIAGNOSIS — I739 Peripheral vascular disease, unspecified: Secondary | ICD-10-CM | POA: Diagnosis not present

## 2017-08-07 DIAGNOSIS — M25511 Pain in right shoulder: Secondary | ICD-10-CM | POA: Insufficient documentation

## 2017-08-07 DIAGNOSIS — F17210 Nicotine dependence, cigarettes, uncomplicated: Secondary | ICD-10-CM | POA: Diagnosis not present

## 2017-08-07 DIAGNOSIS — D649 Anemia, unspecified: Secondary | ICD-10-CM | POA: Insufficient documentation

## 2017-08-07 DIAGNOSIS — R200 Anesthesia of skin: Secondary | ICD-10-CM | POA: Diagnosis not present

## 2017-08-07 DIAGNOSIS — R202 Paresthesia of skin: Secondary | ICD-10-CM

## 2017-08-07 DIAGNOSIS — M4802 Spinal stenosis, cervical region: Secondary | ICD-10-CM | POA: Insufficient documentation

## 2017-08-07 DIAGNOSIS — M542 Cervicalgia: Secondary | ICD-10-CM | POA: Insufficient documentation

## 2017-08-07 DIAGNOSIS — F17200 Nicotine dependence, unspecified, uncomplicated: Secondary | ICD-10-CM

## 2017-08-07 DIAGNOSIS — M722 Plantar fascial fibromatosis: Secondary | ICD-10-CM | POA: Diagnosis not present

## 2017-08-07 DIAGNOSIS — I10 Essential (primary) hypertension: Secondary | ICD-10-CM | POA: Diagnosis not present

## 2017-08-07 MED ORDER — CYCLOBENZAPRINE HCL 10 MG PO TABS: 10.0000 mg | ORAL_TABLET | Freq: Three times a day (TID) | ORAL | 2 refills | Status: DC | PRN

## 2017-08-07 MED ORDER — DICLOFENAC SODIUM 50 MG PO TBEC: 50.0000 mg | DELAYED_RELEASE_TABLET | Freq: Three times a day (TID) | ORAL | 2 refills | Status: DC

## 2017-08-07 NOTE — Progress Notes (Signed)
Subjective:    Patient ID: Amy Bruce, female    DOB: 04/02/1951, 66 y.o.   MRN: [id number]  HPI  66 y/o female with pmh/psh ofHTN, anemia, B12 deficiency, rheumatic fever, cervical fusion present for follow up for general pain.   Initially stated: Present since fusion 1991, getting worse in last couple weeks.  She fractured her humerus in 2016 and states she was slinged.  No alleviating factors. Everything exacerbates the pain.  Sharp pain.  Radiates to hands.  She has associated numbness.  Ibu does not help. Narco, flexaril helps. Denies falls. Pain limits from doing ADLs. Pt notes she had similar symptoms on left side before first fusion. Pt relocated 12/2014 from [place], [place] where she was being followed by Pain clinic.    Last clinic visit 06/26/17. Since last visit, she states she takes her medications as prescribed. She has PVD and is following up with Vascular.  She is seeing a Podiatrist. She has not heard about her lidoderm patch. BP is controlled today. She is using 2 cig/day.    Pain Inventory Average Pain 9 Pain Right Now 9 My pain is burning and tingling  In the last 24 hours, has pain interfered with the following? General activity 7 Relation with others 7 Enjoyment of life 7 What TIME of day is your pain at its worst? varies Sleep (in general) Good  Pain is worse with: some activites Pain improves with: medication and TENS Relief from Meds: 9  Mobility walk without assistance how many minutes can you walk? 5 ability to climb steps?  yes do you drive?  yes Do you have any goals in this area?  yes  Function not employed: date last employed n/a retired  Neuro/Psych weakness numbness tingling trouble walking spasms  Prior Studies Any changes since last visit?  no  Physicians involved in your care Any changes since last visit?  no   No pertinent family history of neck/shoulder pain. Social History   Socioeconomic History  . Marital status:  Unknown    Spouse name: None  . Number of children: None  . Years of education: None  . Highest education level: None  Social Needs  . Financial resource strain: None  . Food insecurity - worry: None  . Food insecurity - inability: None  . Transportation needs - medical: None  . Transportation needs - non-medical: None  Occupational History  . None  Tobacco Use  . Smoking status: Current Some Day Smoker    Packs/day: 1.00    Years: 33.00    Pack years: 33.00    Types: Cigarettes  . Smokeless tobacco: Never Used  Substance and Sexual Activity  . Alcohol use: Yes    Comment: 1-2 wk  . Drug use: No  . Sexual activity: None  Other Topics Concern  . None  Social History Narrative  . None   Past surgical history: Cervical fusion. Past medical history: HTN, anemia, B12 deficiency, rheumatic fever, chronic pain BP 129/68   Pulse 96   SpO2 98%   Opioid Risk Score:   Fall Risk Score:  `1  Depression screen PHQ 2/9  Depression screen [facility] 2/9 12/12/2016 11/08/2016 10/11/2016  Decreased Interest 0 0 0  Down, Depressed, Hopeless 0 0 0  PHQ - 2 Score 0 0 0  Altered sleeping - - 0  Tired, decreased energy - - 0  Change in appetite - - 0  Feeling bad or failure about yourself  - - 0  Trouble concentrating - - 0  Moving slowly or fidgety/restless - - 0  Suicidal thoughts - - 0  PHQ-9 Score - - 0  Difficult doing work/chores - - Not difficult at all   Review of Systems  Constitutional: Positive for unexpected weight change.       Weight gain in relatively short amount of time  HENT: Negative.   Eyes: Negative.   Respiratory: Negative.   Cardiovascular: Negative.   Gastrointestinal: Negative.   Endocrine: Negative.   Genitourinary: Negative.   Musculoskeletal: Positive for arthralgias, back pain, myalgias and neck pain.       Spasms  Skin: Negative.   Allergic/Immunologic: Negative.   Neurological: Positive for weakness and numbness.       Tingling   Hematological:  Negative.   Psychiatric/Behavioral: Negative.   All other systems reviewed and are negative.     Objective:   Physical Exam Gen: NAD. Vital signs reviewed HENT: Normocephalic, Atraumatic. Eyes: EOMI. No discharge.  Cardio: RRR. No JVD. Pulm: B/l clear to auscultation.  Effort normal Abd: Soft, BS+ MSK:  Gait WNL.   No TTP along right traps, and right scapula.    No edema.  Neuro:   Strength 5/5 in all LE myotomes (Right side slightly limited by pain) Skin: Warm and Dry. Intact    Assessment & Plan:  66 y/o female with pmh/psh of  HTN, anemia, B12 deficiency, rheumatic fever, cervical fusion present for follow up of general pain.    1. 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   Heat/Cold, Baclofen, and Mobic ineffective, unable to tolerate Cymbalta  Xray reviewed, showing mild chronic changes  Cont HEP, completed PT  Cont TENS  Cont Gabapentin 600 TID  Cont diclofenac 50 TID, may continue per Vascular   Cont Flexaril 10 TID   Awaiting approval for Lidoderm patch, pt still has not heard back, will need to follow up  No pronounced pain in right shoulder - may consider injection in future - educated on injection   2. Myalgia and muscle spasms  Will consider Trigger point injections in future  See #1  3. Right CTS  See #1  Cont brace qhs  4. HTN  Relatively controlled today  5. Tobacco abuse  Smoking 2 cig/day  Encouraged to follow up with PCP for gum/meds/etc  6. Hx of post herpatic neuralgia  See #1  7. PVD  Limiting mobility  B/l LE  Follow up with Vascular  8. Plantar fascitis  Cont follow up with podiatry  Encourage stretching, frozen bottle, bracing  Encouraged trial of TENS

## 2017-08-14 ENCOUNTER — Ambulatory Visit (INDEPENDENT_AMBULATORY_CARE_PROVIDER_SITE_OTHER): Payer: Medicare Other | Admitting: Podiatry

## 2017-08-14 ENCOUNTER — Encounter: Payer: Self-pay | Admitting: Podiatry

## 2017-08-14 DIAGNOSIS — L03031 Cellulitis of right toe: Secondary | ICD-10-CM

## 2017-08-14 DIAGNOSIS — M722 Plantar fascial fibromatosis: Secondary | ICD-10-CM | POA: Diagnosis not present

## 2017-08-14 DIAGNOSIS — I999 Unspecified disorder of circulatory system: Secondary | ICD-10-CM

## 2017-08-14 NOTE — Patient Instructions (Signed)

## 2017-08-14 NOTE — Progress Notes (Signed)
Subjective:   Patient ID: [name], female   DOB: 66 y.o.   MRN: [id number]   HPI Patient presents stating that her nail on her right big toe is been bothering her and her heel still hurt her if she is on them a lot   ROS      Objective:  Physical Exam  Patient does have vascular disease which she is having treated currently and would probably be of benefit if anything can be done in order to increase circulation and she is to meet with her doctor in the near future.  I did go ahead today and I recommended careful trimming of the corners the ingrown toenails I did note distal redness with no active drainage noted     Assessment:  Localized paronychia infection of the hallux right with no proximal edema erythema or drainage with vascular disease and chronic fasciitis     Plan:  Patient is due to see her vascular doctor and I have encouraged her to have a conversation with him and I did infiltrate the right hallux 60 mg like Marcaine mixture and under sterile conditions I removed the medial lateral borders just taking out the irritated nail and cleaning up the distal tissue.  I advised on soaks and I applied sterile dressing and I explained that there can be a problem with healing and if so I want to see her back immediately

## 2017-09-10 ENCOUNTER — Other Ambulatory Visit: Payer: Self-pay | Admitting: Physical Medicine & Rehabilitation

## 2017-09-11 DIAGNOSIS — I739 Peripheral vascular disease, unspecified: Secondary | ICD-10-CM | POA: Insufficient documentation

## 2017-09-11 DIAGNOSIS — I10 Essential (primary) hypertension: Secondary | ICD-10-CM | POA: Insufficient documentation

## 2017-10-02 ENCOUNTER — Other Ambulatory Visit: Payer: Self-pay | Admitting: Physical Medicine & Rehabilitation

## 2017-10-08 ENCOUNTER — Ambulatory Visit: Payer: Medicare Other | Admitting: Cardiovascular Disease

## 2017-10-09 ENCOUNTER — Encounter: Payer: Medicare Other | Admitting: Physical Medicine & Rehabilitation

## 2017-10-14 ENCOUNTER — Other Ambulatory Visit: Payer: Self-pay | Admitting: Physical Medicine & Rehabilitation

## 2017-10-15 ENCOUNTER — Ambulatory Visit: Payer: Medicare Other | Admitting: Cardiovascular Disease

## 2017-10-16 ENCOUNTER — Encounter: Payer: Medicare Other | Admitting: Physical Medicine & Rehabilitation

## 2017-10-21 ENCOUNTER — Other Ambulatory Visit: Payer: Self-pay | Admitting: Gastroenterology

## 2017-10-21 DIAGNOSIS — R140 Abdominal distension (gaseous): Secondary | ICD-10-CM

## 2017-10-21 DIAGNOSIS — R109 Unspecified abdominal pain: Secondary | ICD-10-CM

## 2017-10-22 ENCOUNTER — Ambulatory Visit (INDEPENDENT_AMBULATORY_CARE_PROVIDER_SITE_OTHER): Payer: Medicare Other | Admitting: Cardiovascular Disease

## 2017-10-22 ENCOUNTER — Encounter: Payer: Self-pay | Admitting: Cardiovascular Disease

## 2017-10-22 VITALS — BP 120/62 | HR 106 | Ht 60.0 in | Wt 164.4 lb

## 2017-10-22 DIAGNOSIS — Z720 Tobacco use: Secondary | ICD-10-CM

## 2017-10-22 DIAGNOSIS — E785 Hyperlipidemia, unspecified: Secondary | ICD-10-CM | POA: Diagnosis not present

## 2017-10-22 DIAGNOSIS — I739 Peripheral vascular disease, unspecified: Secondary | ICD-10-CM

## 2017-10-22 MED ORDER — ATORVASTATIN CALCIUM 40 MG PO TABS: 40.0000 mg | ORAL_TABLET | Freq: Every day | ORAL | 3 refills | Status: DC

## 2017-10-22 NOTE — Patient Instructions (Signed)
Medication Instructions: INCREASE Atorvastatin to 40 mg tablet daily  If you need a refill on your cardiac medications before your next appointment, please call your pharmacy.   Follow-Up: Your physician wants you to follow-up in: 4 months with Dr. Fletcher Anon. You will receive a reminder letter in the mail two months in advance. If you don't receive a letter, please call our office at [phone] to schedule this follow-up appointment.   Special Instructions: Please hold Pletal 2 days prior to dental extractions.   Thank you for choosing [facility] at [facility]!!

## 2017-10-22 NOTE — Progress Notes (Signed)
Cardiology Office Note   Date:  10/22/2017   ID:  Amy Bruce, DOB 1951/05/01, MRN [id number]  PCP:  London Pepper, MD  Cardiologist:   Kathlyn Sacramento, MD   No chief complaint on file.     History of Present Illness: Amy Bruce is a 67 y.o. female who is here today for a follow-up visit regarding peripheral arterial disease.  She has prolonged history of tobacco use, essential hypertension and hyperlipidemia.  She reports previous rheumatic fever when she was 4 with a heart murmur but that has resolved in recent years.  She smokes half a pack per day and has been doing so since she was 67 years old. CT scan for lung cancer screening in May 2018 showed evidence of calcified atherosclerosis affecting the aorta and coronary arteries. She has prolonged history of plantar fasciitis with significant pain at the bottom of her feet with walking.  In addition to that, she reported bilateral calf discomfort after walking about half a block which was worse on the right side.   She underwent vascular studies which showed an ABI of 0.54 on the right and 0.81 on the left. Duplex showed mild right common femoral artery stenosis with occluded mid SFA.  On the left side, there was mild disease affecting the SFA and common femoral artery. I advised her to quit smoking and started her on cilostazol.  She cut down on tobacco use and she is down to 1 cigarette a day.  She is currently using a nicotine patch.  She reports improvement in calf discomfort although she continues to have significant pain in her feet with walking. She has significant dental problems and is going for extraction soon.  She is mildly tachycardic today but she denies any chest pain, shortness of breath or palpitations.  Past Medical History:  Diagnosis Date  . Hyperlipidemia   . Hypertension   . PAD (peripheral artery disease) ([place])   . Tobacco abuse     Past Surgical History:  Procedure Laterality Date  . ABDOMINAL  HYSTERECTOMY  1999  . [place]   C5/6 6/7  . Left axillary cyst removal 1989    . TONSILECTOMY, ADENOIDECTOMY, BILATERAL MYRINGOTOMY AND TUBES  1981  . TUBAL LIGATION  1981     Current Outpatient Medications  Medication Sig Dispense Refill  . acetaminophen-codeine (TYLENOL #3) 300-30 MG tablet Take 1 tablet by mouth daily as needed.    Marland Kitchen aspirin EC 81 MG tablet Take 81 mg by mouth daily.    Marland Kitchen atorvastatin (LIPITOR) 20 MG tablet 1 tablet    . cilostazol (PLETAL) 50 MG tablet Take 1 tablet (50 mg total) by mouth 2 (two) times daily. 90 tablet 3  . Cyanocobalamin (VITAMIN B 12) 100 MCG LOZG 1 capsule with food or milk    . cyclobenzaprine (FLEXERIL) 10 MG tablet Take 1 tablet (10 mg total) by mouth 3 (three) times daily as needed. for muscle spams 90 tablet 2  . diclofenac (VOLTAREN) 50 MG EC tablet Take 1 tablet (50 mg total) by mouth 3 (three) times daily. 90 tablet 2  . gabapentin (NEURONTIN) 600 MG tablet TAKE 1 TABLET BY MOUTH THREE TIMES DAILY 90 tablet 2  . lidocaine (LIDODERM) 5 % UNWRAP AND APPLY 1 PATCH TO SKIN DAILY. REMOVE AND DISCARD PATCH WITHIN 12 HOURS OR AS DIRECTED BY MD. 30 patch 1  . lisinopril (PRINIVIL,ZESTRIL) 20 MG tablet Take 1 tablet by mouth daily.  4  .  nicotine (NICODERM CQ - DOSED IN MG/24 HOURS) 21 mg/24hr patch Place 21 mg onto the skin daily.    . VENTOLIN HFA 108 (90 Base) MCG/ACT inhaler INL 1-2 PFS PO Q 4-6 H PRN  1   No current facility-administered medications for this visit.     Allergies:   Carisoprodol and Duloxetine hcl    Social History:  The patient  reports that she has been smoking cigarettes.  She has a 33.00 pack-year smoking history. she has never used smokeless tobacco. She reports that she drinks alcohol. She reports that she does not use drugs.   Family History:  The patient's family history includes Breast cancer in her maternal aunt.    ROS:  Please see the history of present illness.   Otherwise, review of  systems are positive for none.   All other systems are reviewed and negative.    PHYSICAL EXAM: VS:  BP 120/62   Pulse (!) 106   Ht 5' (1.524 m)   Wt 164 lb 6.4 oz (74.6 kg)   SpO2 97%   BMI 32.11 kg/m  , BMI Body mass index is 32.11 kg/m. GEN: Well nourished, well developed, in no acute distress  HEENT: normal  Neck: no JVD, carotid bruits, or masses Cardiac: RRR; no murmurs, rubs, or gallops,no edema  Respiratory:  clear to auscultation bilaterally, normal work of breathing GI: soft, nontender, nondistended, + BS MS: no deformity or atrophy  Skin: warm and dry, no rash Neuro:  Strength and sensation are intact Psych: euthymic mood, full affect Vascular: Femoral pulses are +1 bilaterally.  Distal pulses are not palpable   EKG:  EKG is ordered today. The ekg ordered today demonstrates normal sinus rhythm with no significant ST or T wave changes.   Recent Labs: No results found for requested labs within last 8760 hours.    Lipid Panel No results found for: CHOL, TRIG, HDL, CHOLHDL, VLDL, LDLCALC, LDLDIRECT    Wt Readings from Last 3 Encounters:  10/22/17 164 lb 6.4 oz (74.6 kg)  07/09/17 166 lb (75.3 kg)  04/26/17 161 lb 12.8 oz (73.4 kg)       No flowsheet data found.    ASSESSMENT AND PLAN:  1.  Peripheral arterial disease with moderate severe bilateral leg claudication worse on the right side: No evidence of critical limb ischemia.  She reports some improvement in calf claudication with cilostazol.  I am going to continue the same medications.  Continue aspirin daily. I explained to her that it is best to be able to quit smoking completely before considering any revascularization in order to improve long-term patency.  She is determined to quit smoking. I do suspect that her symptoms are likely due to a combination of peripheral arterial disease and plantar fasciitis.  Reevaluate in 4 months.  2.  Plantar fasciitis:  Stable.   3.  Tobacco use: She made  significant progress and she is down to 1 cigarette a day.  4.  Hyperlipidemia: I reviewed most recent lipid profile done in March 2018 which showed an HDL of 54, LDL of 88 and triglyceride of 90.  We should try to get her LDL below 70 and thus I increase atorvastatin to 40 mg daily.  She has follow-up visit with her primary care physician soon for a physical.  She can have her labs done there.   Disposition:   FU with me in 4 months  Signed,  Kathlyn Sacramento, MD  10/22/2017 9:10 AM  [facility]

## 2017-10-23 ENCOUNTER — Encounter: Payer: Medicare Other | Attending: Physical Medicine & Rehabilitation | Admitting: Physical Medicine & Rehabilitation

## 2017-10-23 DIAGNOSIS — M25511 Pain in right shoulder: Secondary | ICD-10-CM | POA: Insufficient documentation

## 2017-10-23 DIAGNOSIS — Z981 Arthrodesis status: Secondary | ICD-10-CM | POA: Insufficient documentation

## 2017-10-23 DIAGNOSIS — G8929 Other chronic pain: Secondary | ICD-10-CM | POA: Insufficient documentation

## 2017-10-23 DIAGNOSIS — M542 Cervicalgia: Secondary | ICD-10-CM | POA: Insufficient documentation

## 2017-10-23 DIAGNOSIS — E538 Deficiency of other specified B group vitamins: Secondary | ICD-10-CM | POA: Insufficient documentation

## 2017-10-23 DIAGNOSIS — D649 Anemia, unspecified: Secondary | ICD-10-CM | POA: Insufficient documentation

## 2017-10-23 DIAGNOSIS — I10 Essential (primary) hypertension: Secondary | ICD-10-CM | POA: Insufficient documentation

## 2017-10-23 DIAGNOSIS — M4802 Spinal stenosis, cervical region: Secondary | ICD-10-CM | POA: Insufficient documentation

## 2017-10-23 DIAGNOSIS — F17210 Nicotine dependence, cigarettes, uncomplicated: Secondary | ICD-10-CM | POA: Insufficient documentation

## 2017-10-24 ENCOUNTER — Encounter (HOSPITAL_COMMUNITY): Payer: Self-pay | Admitting: *Deleted

## 2017-10-24 ENCOUNTER — Other Ambulatory Visit: Payer: Self-pay

## 2017-10-24 NOTE — H&P (Signed)
HISTORY AND PHYSICAL  Amy Bruce is a 67 y.o. female patient with CC: left facial swelling. Pt seen in office for consult 10/21/2017. Pt began developing significant swelling left jaw 10/23/2017.  No diagnosis found.  Past Medical History:  Diagnosis Date  . Hyperlipidemia   . Hypertension   . PAD (peripheral artery disease) ([place])   . Tobacco abuse     No current facility-administered medications for this encounter.    Current Outpatient Medications  Medication Sig Dispense Refill  . acetaminophen-codeine (TYLENOL #3) 300-30 MG tablet Take 1 tablet by mouth daily as needed.    Marland Kitchen aspirin EC 81 MG tablet Take 81 mg by mouth daily.    Marland Kitchen atorvastatin (LIPITOR) 40 MG tablet Take 1 tablet (40 mg total) by mouth daily at 6 PM. 90 tablet 3  . cilostazol (PLETAL) 50 MG tablet Take 1 tablet (50 mg total) by mouth 2 (two) times daily. 90 tablet 3  . Cyanocobalamin (VITAMIN B 12) 100 MCG LOZG 1 capsule with food or milk    . cyclobenzaprine (FLEXERIL) 10 MG tablet Take 1 tablet (10 mg total) by mouth 3 (three) times daily as needed. for muscle spams 90 tablet 2  . diclofenac (VOLTAREN) 50 MG EC tablet Take 1 tablet (50 mg total) by mouth 3 (three) times daily. 90 tablet 2  . gabapentin (NEURONTIN) 600 MG tablet TAKE 1 TABLET BY MOUTH THREE TIMES DAILY 90 tablet 2  . lidocaine (LIDODERM) 5 % UNWRAP AND APPLY 1 PATCH TO SKIN DAILY. REMOVE AND DISCARD PATCH WITHIN 12 HOURS OR AS DIRECTED BY MD. 30 patch 1  . lisinopril (PRINIVIL,ZESTRIL) 20 MG tablet Take 1 tablet by mouth daily.  4  . nicotine (NICODERM CQ - DOSED IN MG/24 HOURS) 21 mg/24hr patch Place 21 mg onto the skin daily.    . VENTOLIN HFA 108 (90 Base) MCG/ACT inhaler INL 1-2 PFS PO Q 4-6 H PRN  1   Allergies  Allergen Reactions  . Carisoprodol Swelling  . Duloxetine Hcl     Swelling, hypertension, other problems   Active Problems:   * No active hospital problems. *  Vitals: There were no vitals taken for this visit. Lab  results:No results found for this or any previous visit (from the past 1 hour(s)). Radiology Results: No results found. General appearance: alert, cooperative and no distress Head: Normocephalic, without obvious abnormality, atraumatic Eyes: conjunctivae/corneas clear. PERRL, EOM's intact. Fundi benign. Nose: Nares normal. Septum midline. Mucosa normal. No drainage or sinus tenderness. Throat: lips, mucosa, and tongue normal; teeth and gums normal and left buccal cheek swelling, moderate trismus. Pharynx clear, multiple nonrestorable teeth. Neck: no adenopathy, supple, symmetrical, trachea midline, thyroid not enlarged, symmetric, no tenderness/mass/nodules and Moderate tender and firm swelling left facial/submandiular area.  Resp: clear to auscultation bilaterally Cardio: regular rate and rhythm, S1, S2 normal, no murmur, click, rub or gallop  Assessment: Left submandibular space infection. nonrestorable teeth  Plan: I and D left submandibular space infectionl. Remove multiple nonrestorable teeth. Day surgery. GA. Nasal.   Diona Browner 10/24/2017

## 2017-10-24 NOTE — Progress Notes (Signed)
Spoke with pt for pre-op call. Pt denies cardiac history, chest pain or sob. Pt has hx of PAD and sees Dr. Fletcher Anon. Pt states Dr. Hoyt Koch instructed her to stop her Pletal and Aspirin. Last doses for both was 10/23/17. Pt states she is Pre-diabetic, does not check her blood sugar at home

## 2017-10-25 ENCOUNTER — Encounter (HOSPITAL_COMMUNITY): Payer: Self-pay | Admitting: *Deleted

## 2017-10-25 ENCOUNTER — Inpatient Hospital Stay (HOSPITAL_COMMUNITY): Payer: Medicare Other | Admitting: Certified Registered Nurse Anesthetist

## 2017-10-25 ENCOUNTER — Ambulatory Visit (HOSPITAL_COMMUNITY)
Admission: RE | Admit: 2017-10-25 | Discharge: 2017-10-25 | Disposition: A | Discharge status: Home / Self Care | Payer: Medicare Other | Source: Ambulatory Visit | Attending: Oral Surgery | Admitting: Oral Surgery

## 2017-10-25 ENCOUNTER — Encounter (HOSPITAL_COMMUNITY)
Admission: RE | Disposition: A | Discharge status: Home / Self Care | Payer: Self-pay | Source: Ambulatory Visit | Attending: Oral Surgery

## 2017-10-25 DIAGNOSIS — I739 Peripheral vascular disease, unspecified: Secondary | ICD-10-CM | POA: Diagnosis not present

## 2017-10-25 DIAGNOSIS — M272 Inflammatory conditions of jaws: Secondary | ICD-10-CM | POA: Diagnosis not present

## 2017-10-25 DIAGNOSIS — Z87891 Personal history of nicotine dependence: Secondary | ICD-10-CM | POA: Insufficient documentation

## 2017-10-25 DIAGNOSIS — K029 Dental caries, unspecified: Secondary | ICD-10-CM | POA: Insufficient documentation

## 2017-10-25 DIAGNOSIS — Z79899 Other long term (current) drug therapy: Secondary | ICD-10-CM | POA: Diagnosis not present

## 2017-10-25 DIAGNOSIS — I10 Essential (primary) hypertension: Secondary | ICD-10-CM | POA: Diagnosis not present

## 2017-10-25 DIAGNOSIS — Z888 Allergy status to other drugs, medicaments and biological substances status: Secondary | ICD-10-CM | POA: Insufficient documentation

## 2017-10-25 DIAGNOSIS — E785 Hyperlipidemia, unspecified: Secondary | ICD-10-CM | POA: Diagnosis not present

## 2017-10-25 HISTORY — DX: Unspecified osteoarthritis, unspecified site: M19.90

## 2017-10-25 HISTORY — DX: Anxiety disorder, unspecified: F41.9

## 2017-10-25 HISTORY — DX: Polyneuropathy, unspecified: G62.9

## 2017-10-25 HISTORY — PX: MULTIPLE EXTRACTIONS WITH ALVEOLOPLASTY: SHX5342

## 2017-10-25 HISTORY — DX: Gastro-esophageal reflux disease without esophagitis: K21.9

## 2017-10-25 HISTORY — DX: Cardiac murmur, unspecified: R01.1

## 2017-10-25 HISTORY — DX: Anemia, unspecified: D64.9

## 2017-10-25 HISTORY — DX: Myalgia, unspecified site: M79.10

## 2017-10-25 HISTORY — DX: Prediabetes: R73.03

## 2017-10-25 LAB — CBC
HEMATOCRIT: 38.6 % (ref 36.0–46.0)
HEMOGLOBIN: 12.9 g/dL (ref 12.0–15.0)
MCH: 30.6 pg (ref 26.0–34.0)
MCHC: 33.4 g/dL (ref 30.0–36.0)
MCV: 91.7 fL (ref 78.0–100.0)
Platelets: 221 10*3/uL (ref 150–400)
RBC: 4.21 MIL/uL (ref 3.87–5.11)
RDW: 14.1 % (ref 11.5–15.5)
WBC: 10.8 10*3/uL — AB (ref 4.0–10.5)

## 2017-10-25 LAB — BASIC METABOLIC PANEL
ANION GAP: 15 (ref 5–15)
BUN: 11 mg/dL (ref 6–20)
CO2: 23 mmol/L (ref 22–32)
Calcium: 9.6 mg/dL (ref 8.9–10.3)
Chloride: 100 mmol/L — ABNORMAL LOW (ref 101–111)
Creatinine, Ser: 1.04 mg/dL — ABNORMAL HIGH (ref 0.44–1.00)
GFR, EST NON AFRICAN AMERICAN: 54 mL/min — AB (ref >60)
GLUCOSE: 133 mg/dL — AB (ref 65–99)
POTASSIUM: 3.7 mmol/L (ref 3.5–5.1)
SODIUM: 138 mmol/L (ref 135–145)

## 2017-10-25 LAB — HEMOGLOBIN A1C
Hgb A1c MFr Bld: 6 % — ABNORMAL HIGH (ref 4.8–5.6)
Mean Plasma Glucose: 125.5 mg/dL

## 2017-10-25 SURGERY — MULTIPLE EXTRACTION WITH ALVEOLOPLASTY
Anesthesia: General | Site: Mouth

## 2017-10-25 MED ORDER — FENTANYL CITRATE (PF) 250 MCG/5ML IJ SOLN
INTRAMUSCULAR | Status: AC
  Filled 2017-10-25: qty 5

## 2017-10-25 MED ORDER — CLINDAMYCIN PHOSPHATE 900 MG/50ML IV SOLN
INTRAVENOUS | Status: DC | PRN
  Administered 2017-10-25: 900 mg via INTRAVENOUS

## 2017-10-25 MED ORDER — PHENYLEPHRINE HCL 10 MG/ML IJ SOLN
INTRAMUSCULAR | Status: DC | PRN
  Administered 2017-10-25: 80 ug via INTRAVENOUS
  Administered 2017-10-25: 160 ug via INTRAVENOUS

## 2017-10-25 MED ORDER — MEPERIDINE HCL 50 MG/ML IJ SOLN: 6.2500 mg | INTRAMUSCULAR | Status: DC | PRN

## 2017-10-25 MED ORDER — LACTATED RINGERS IV SOLN
INTRAVENOUS | Status: DC
  Administered 2017-10-25: 09:00:00 via INTRAVENOUS

## 2017-10-25 MED ORDER — MIDAZOLAM HCL 2 MG/2ML IJ SOLN
INTRAMUSCULAR | Status: AC
  Filled 2017-10-25: qty 2

## 2017-10-25 MED ORDER — OXYMETAZOLINE HCL 0.05 % NA SOLN
NASAL | Status: DC | PRN
  Administered 2017-10-25: 1

## 2017-10-25 MED ORDER — ROCURONIUM BROMIDE 100 MG/10ML IV SOLN
INTRAVENOUS | Status: DC | PRN
  Administered 2017-10-25: 50 mg via INTRAVENOUS

## 2017-10-25 MED ORDER — EPHEDRINE 5 MG/ML INJ
INTRAVENOUS | Status: AC
  Filled 2017-10-25: qty 10

## 2017-10-25 MED ORDER — PROPOFOL 10 MG/ML IV BOLUS
INTRAVENOUS | Status: DC | PRN
  Administered 2017-10-25: 150 mg via INTRAVENOUS
  Administered 2017-10-25: 50 mg via INTRAVENOUS

## 2017-10-25 MED ORDER — DEXAMETHASONE SODIUM PHOSPHATE 10 MG/ML IJ SOLN
INTRAMUSCULAR | Status: AC
  Filled 2017-10-25: qty 1

## 2017-10-25 MED ORDER — PHENYLEPHRINE 40 MCG/ML (10ML) SYRINGE FOR IV PUSH (FOR BLOOD PRESSURE SUPPORT)
PREFILLED_SYRINGE | INTRAVENOUS | Status: AC
  Filled 2017-10-25: qty 10

## 2017-10-25 MED ORDER — ONDANSETRON HCL 4 MG/2ML IJ SOLN
INTRAMUSCULAR | Status: DC | PRN
  Administered 2017-10-25: 4 mg via INTRAVENOUS

## 2017-10-25 MED ORDER — ROCURONIUM BROMIDE 10 MG/ML (PF) SYRINGE
PREFILLED_SYRINGE | INTRAVENOUS | Status: AC
  Filled 2017-10-25: qty 5

## 2017-10-25 MED ORDER — FENTANYL CITRATE (PF) 100 MCG/2ML IJ SOLN: 25.0000 ug | INTRAMUSCULAR | Status: DC | PRN

## 2017-10-25 MED ORDER — FENTANYL CITRATE (PF) 100 MCG/2ML IJ SOLN
INTRAMUSCULAR | Status: DC | PRN
  Administered 2017-10-25: 100 ug via INTRAVENOUS
  Administered 2017-10-25: 50 ug via INTRAVENOUS

## 2017-10-25 MED ORDER — PROPOFOL 10 MG/ML IV BOLUS
INTRAVENOUS | Status: AC
  Filled 2017-10-25: qty 20

## 2017-10-25 MED ORDER — SUGAMMADEX SODIUM 200 MG/2ML IV SOLN
INTRAVENOUS | Status: AC
  Filled 2017-10-25: qty 2

## 2017-10-25 MED ORDER — CLINDAMYCIN PHOSPHATE 900 MG/50ML IV SOLN
INTRAVENOUS | Status: AC
  Filled 2017-10-25: qty 50

## 2017-10-25 MED ORDER — LIDOCAINE HCL (CARDIAC) 20 MG/ML IV SOLN
INTRAVENOUS | Status: DC | PRN
  Administered 2017-10-25: 100 mg via INTRAVENOUS

## 2017-10-25 MED ORDER — DEXAMETHASONE SODIUM PHOSPHATE 10 MG/ML IJ SOLN
INTRAMUSCULAR | Status: DC | PRN
  Administered 2017-10-25: 10 mg via INTRAVENOUS

## 2017-10-25 MED ORDER — ONDANSETRON HCL 4 MG/2ML IJ SOLN
INTRAMUSCULAR | Status: AC
  Filled 2017-10-25: qty 2

## 2017-10-25 MED ORDER — OXYCODONE-ACETAMINOPHEN 5-325 MG PO TABS: 1.0000 | ORAL_TABLET | ORAL | 0 refills | Status: DC | PRN

## 2017-10-25 MED ORDER — SUGAMMADEX SODIUM 200 MG/2ML IV SOLN
INTRAVENOUS | Status: DC | PRN
  Administered 2017-10-25: 300 mg via INTRAVENOUS

## 2017-10-25 MED ORDER — MIDAZOLAM HCL 5 MG/5ML IJ SOLN
INTRAMUSCULAR | Status: DC | PRN
  Administered 2017-10-25: 1 mg via INTRAVENOUS

## 2017-10-25 MED ORDER — SODIUM CHLORIDE 0.9 % IR SOLN
Status: DC | PRN
  Administered 2017-10-25: 1000 mL

## 2017-10-25 MED ORDER — SUGAMMADEX SODIUM 200 MG/2ML IV SOLN
INTRAVENOUS | Status: AC
  Filled 2017-10-25: qty 4

## 2017-10-25 MED ORDER — 0.9 % SODIUM CHLORIDE (POUR BTL) OPTIME
TOPICAL | Status: DC | PRN
  Administered 2017-10-25: 1000 mL

## 2017-10-25 MED ORDER — SUCCINYLCHOLINE CHLORIDE 200 MG/10ML IV SOSY
PREFILLED_SYRINGE | INTRAVENOUS | Status: AC
  Filled 2017-10-25: qty 10

## 2017-10-25 MED ORDER — METOCLOPRAMIDE HCL 5 MG/ML IJ SOLN
10.0000 mg | Freq: Once | INTRAMUSCULAR | Status: DC | PRN
Start: 2017-10-25 — End: 2017-10-25

## 2017-10-25 MED ORDER — LIDOCAINE-EPINEPHRINE 2 %-1:100000 IJ SOLN
INTRAMUSCULAR | Status: DC | PRN
  Administered 2017-10-25: 20 mL

## 2017-10-25 MED ORDER — LIDOCAINE 2% (20 MG/ML) 5 ML SYRINGE
INTRAMUSCULAR | Status: AC
  Filled 2017-10-25: qty 5

## 2017-10-25 MED ORDER — LACTATED RINGERS IV SOLN: INTRAVENOUS | Status: DC

## 2017-10-25 SURGICAL SUPPLY — 40 items
BUR CROSS CUT FISSURE 1.6 (BURR) ×2 IMPLANT
BUR CROSS CUT FISSURE 1.6MM (BURR) ×1
BUR EGG ELITE 4.0 (BURR) ×2 IMPLANT
BUR EGG ELITE 4.0MM (BURR) ×1
CANISTER SUCT 3000ML PPV (MISCELLANEOUS) ×3 IMPLANT
COVER SURGICAL LIGHT HANDLE (MISCELLANEOUS) ×3 IMPLANT
CRADLE DONUT ADULT HEAD (MISCELLANEOUS) ×3 IMPLANT
DECANTER SPIKE VIAL GLASS SM (MISCELLANEOUS) IMPLANT
DRAIN PENROSE 1/4X12 LTX STRL (WOUND CARE) ×3 IMPLANT
FLUID NSS /IRRIG 1000 ML XXX (MISCELLANEOUS) ×3 IMPLANT
GAUZE PACKING FOLDED 2  STR (GAUZE/BANDAGES/DRESSINGS) ×2
GAUZE PACKING FOLDED 2 STR (GAUZE/BANDAGES/DRESSINGS) ×1 IMPLANT
GAUZE SPONGE 4X4 12PLY STRL (GAUZE/BANDAGES/DRESSINGS) ×3 IMPLANT
GLOVE BIO SURGEON STRL SZ 6.5 (GLOVE) ×2 IMPLANT
GLOVE BIO SURGEON STRL SZ7.5 (GLOVE) ×3 IMPLANT
GLOVE BIO SURGEONS STRL SZ 6.5 (GLOVE) ×1
GLOVE BIOGEL PI IND STRL 6.5 (GLOVE) ×1 IMPLANT
GLOVE BIOGEL PI IND STRL 7.0 (GLOVE) IMPLANT
GLOVE BIOGEL PI INDICATOR 6.5 (GLOVE) ×2
GLOVE BIOGEL PI INDICATOR 7.0 (GLOVE)
GOWN STRL REUS W/ TWL LRG LVL3 (GOWN DISPOSABLE) ×1 IMPLANT
GOWN STRL REUS W/ TWL XL LVL3 (GOWN DISPOSABLE) ×1 IMPLANT
GOWN STRL REUS W/TWL LRG LVL3 (GOWN DISPOSABLE) ×2
GOWN STRL REUS W/TWL XL LVL3 (GOWN DISPOSABLE) ×2
KIT BASIN OR (CUSTOM PROCEDURE TRAY) ×3 IMPLANT
KIT TURNOVER KIT B (KITS) ×3 IMPLANT
NEEDLE 22X1 1/2 (OR ONLY) (NEEDLE) ×6 IMPLANT
NEEDLE 27GAX1X1/2 (NEEDLE) IMPLANT
NS IRRIG 1000ML POUR BTL (IV SOLUTION) ×3 IMPLANT
PAD ARMBOARD 7.5X6 YLW CONV (MISCELLANEOUS) ×3 IMPLANT
SOLUTION BETADINE 4OZ (MISCELLANEOUS) ×3 IMPLANT
SUT CHROMIC 3 0 PS 2 (SUTURE) ×3 IMPLANT
SUT SILK 3 0 SH 30 (SUTURE) ×3 IMPLANT
SWAB COLLECTION DEVICE MRSA (MISCELLANEOUS) ×3 IMPLANT
SYR CONTROL 10ML LL (SYRINGE) ×3 IMPLANT
TAPE CLOTH SURG 4X10 WHT LF (GAUZE/BANDAGES/DRESSINGS) ×3 IMPLANT
TRAY ENT MC OR (CUSTOM PROCEDURE TRAY) ×3 IMPLANT
TUBE ANAEROBIC SPECIMEN COL (MISCELLANEOUS) ×3 IMPLANT
TUBING IRRIGATION (MISCELLANEOUS) ×3 IMPLANT
YANKAUER SUCT BULB TIP NO VENT (SUCTIONS) ×3 IMPLANT

## 2017-10-25 NOTE — Op Note (Signed)
10/25/2017  12:25 PM  PATIENT:  [name]  67 y.o. female  PRE-OPERATIVE DIAGNOSIS:  Non restorable teeth # 5, 7, 8, 9, 10, 15, 18; left submandibular space infection  POST-OPERATIVE DIAGNOSIS:  SAME  PROCEDURE:  Procedure(s): EXTRACTION NUMBERS FIVE, SEVEN, EIGHT, NINE, TEN, FIFTEEN, EIGHTEEN , INCISION AND DRAINAGE LEFT SUBMANDIBULAR INFECTION  SURGEON:  Surgeon(s): Diona Browner, DDS  ANESTHESIA:   local and general  EBL:  minimal  DRAINS: 1/4 INCH PENROSE LEFT MANDIBLE  CULTURES: AEROBIC AND ANAEROBIC  SPECIMEN:  No Specimen  COUNTS:  YES  PLAN OF CARE: Discharge to home after PACU  PATIENT DISPOSITION:  PACU - hemodynamically stable.   PROCEDURE DETAILS: Dictation #   Gae Bon, DMD 10/25/2017 12:25 PM

## 2017-10-25 NOTE — Anesthesia Procedure Notes (Signed)
Procedure Name: Intubation Date/Time: 10/25/2017 12:00 PM Performed by: Shirlyn Goltz, CRNA Pre-anesthesia Checklist: Patient identified, Emergency Drugs available, Suction available and Patient being monitored Patient Re-evaluated:Patient Re-evaluated prior to induction Oxygen Delivery Method: Circle system utilized Preoxygenation: Pre-oxygenation with 100% oxygen Induction Type: IV induction Ventilation: Mask ventilation without difficulty Laryngoscope Size: Mac and 3 Grade View: Grade I Nasal Tubes: Right, Nasal prep performed and Nasal Rae Tube size: 7.0 mm Number of attempts: 1 Placement Confirmation: ETT inserted through vocal cords under direct vision,  positive ETCO2 and breath sounds checked- equal and bilateral Tube secured with: Tape Dental Injury: Teeth and Oropharynx as per pre-operative assessment

## 2017-10-25 NOTE — Transfer of Care (Signed)
Immediate Anesthesia Transfer of Care Note  Patient: Amy Bruce  Procedure(s) Performed: EXTRACTION NUMBERS FIVE, SEVEN, EIGHT, NINE, TEN, FIFTEEN, EIGHTEEN WITH ALVEOLOPLASTY, IRRIGATION AND DEBRIDEMENT LEFT SUBMANDIBULAR INFECTION (N/A Mouth)  Patient Location: PACU  Anesthesia Type:General  Level of Consciousness: awake, alert , oriented and drowsy  Airway & Oxygen Therapy: Patient Spontanous Breathing and Patient connected to face mask oxygen  Post-op Assessment: Report given to RN and Post -op Vital signs reviewed and stable  Post vital signs: Reviewed and stable  Last Vitals:  Vitals:   10/25/17 0817 10/25/17 1246  BP: (!) 123/58   Pulse: (!) 110   Resp: 20   Temp: 36.4 C 37.2 C  SpO2: 100%     Last Pain:  Vitals:   10/25/17 0924  TempSrc:   PainSc: 5       Patients Stated Pain Goal: 3 (00/92/33 0076)  Complications: No apparent anesthesia complications

## 2017-10-25 NOTE — H&P (Signed)
H&P documentation  -History and Physical Reviewed  -Patient has been re-examined  -No change in the plan of care  Amy Bruce  

## 2017-10-25 NOTE — Anesthesia Preprocedure Evaluation (Signed)
Anesthesia Evaluation  Patient identified by MRN, date of birth, ID band Patient awake    Reviewed: Allergy & Precautions, NPO status , Patient's Chart, lab work & pertinent test results  Airway Mallampati: II  TM Distance: >3 FB Neck ROM: Full    Dental no notable dental hx. (+) Poor Dentition, Missing, Loose, Chipped   Pulmonary former smoker,    Pulmonary exam normal breath sounds clear to auscultation       Cardiovascular hypertension, + Peripheral Vascular Disease  negative cardio ROS Normal cardiovascular exam Rhythm:Regular Rate:Normal     Neuro/Psych negative neurological ROS  negative psych ROS   GI/Hepatic negative GI ROS, Neg liver ROS,   Endo/Other  negative endocrine ROS  Renal/GU negative Renal ROS  negative genitourinary   Musculoskeletal negative musculoskeletal ROS (+)   Abdominal   Peds negative pediatric ROS (+)  Hematology negative hematology ROS (+)   Anesthesia Other Findings   Reproductive/Obstetrics negative OB ROS                             Anesthesia Physical Anesthesia Plan  ASA: III  Anesthesia Plan: General   Post-op Pain Management:    Induction: Intravenous  PONV Risk Score and Plan: 2 and Ondansetron, Treatment may vary due to age or medical condition and Midazolam  Airway Management Planned: Nasal ETT  Additional Equipment:   Intra-op Plan:   Post-operative Plan: Extubation in OR  Informed Consent: I have reviewed the patients History and Physical, chart, labs and discussed the procedure including the risks, benefits and alternatives for the proposed anesthesia with the patient or authorized representative who has indicated his/her understanding and acceptance.   Dental advisory given  Plan Discussed with: CRNA  Anesthesia Plan Comments:         Anesthesia Quick Evaluation

## 2017-10-28 ENCOUNTER — Encounter (HOSPITAL_COMMUNITY): Payer: Self-pay | Admitting: Oral Surgery

## 2017-10-28 NOTE — Anesthesia Postprocedure Evaluation (Signed)
Anesthesia Post Note  Patient: Amy Bruce  Procedure(s) Performed: EXTRACTION NUMBERS FIVE, SEVEN, EIGHT, NINE, TEN, FIFTEEN, EIGHTEEN WITH ALVEOLOPLASTY, IRRIGATION AND DEBRIDEMENT LEFT SUBMANDIBULAR INFECTION (N/A Mouth)     Patient location during evaluation: PACU Anesthesia Type: General Level of consciousness: awake and alert Pain management: pain level controlled Vital Signs Assessment: post-procedure vital signs reviewed and stable Respiratory status: spontaneous breathing, nonlabored ventilation, respiratory function stable and patient connected to nasal cannula oxygen Cardiovascular status: blood pressure returned to baseline and stable Postop Assessment: no apparent nausea or vomiting Anesthetic complications: no    Last Vitals:  Vitals:   10/25/17 1334 10/25/17 1345  BP: (!) 102/58   Pulse: 92   Resp: 13   Temp:  (!) 36.2 C  SpO2: 99%     Last Pain:  Vitals:   10/25/17 1315  TempSrc:   PainSc: 0-No pain                 Montez Hageman

## 2017-10-28 NOTE — Op Note (Signed)
NAMEISMAHAN, LIPPMAN               ACCOUNT NO.:  [id number]  MEDICAL RECORD NO.:  [id number]  LOCATION:                                 FACILITY:  PHYSICIAN:  Gae Bon, M.D.       DATE OF BIRTH:  DATE OF PROCEDURE:  10/25/2017 DATE OF DISCHARGE:  10/25/2017                              OPERATIVE REPORT   PREOPERATIVE DIAGNOSES: 1. Left submandibular space infection. 2. Nonrestorable teeth numbers 5, 7, 8, 9, 10, 15, 18.  PROCEDURES: 1. Extraction of 5, 7, 8, 9, 10, 15, 18. 2. Incision and drainage, left submandibular space infection.  SURGEON:  Gae Bon, MD.  ANESTHESIA:  General nasal intubation.  DESCRIPTION OF PROCEDURE:  The patient was taken to the operating room and placed on the table in supine position.  General anesthesia was administered and a nasal endotracheal tube was placed and secured.  The eyes were protected and the patient was prepped and draped for the procedure.  Time-out was performed.  The incision and drainage extraorally was performed first.  The left mandible in the infected area was palpated and the area of most dependent swelling was approximately 2 cm below the inferior border of the mandible in the midportion of the body.  Local anesthetic was administered in this area subcutaneously and through the bone where tooth #18 was.  A total of 10 mL was utilized and a 15 blade was used to make a 1.5-cm incision horizontal to the mandible.  A hemostat was then used to dissect upwards towards the inferior border of the mandible.  Moderate pink yellow fluid was expressed and cultures were taken aerobic and anaerobic.  Then, the hemostat was used to go both lingually and superiorly of the mandible ramus to engage any loculations that might be present.  Then, a quarter- inch Penrose drain was placed and sutured to the skin with 3-0 silk. Then, a bite block was placed inside the mouth and the oropharynx was suctioned.  A throat pack was placed.   2% lidocaine with 1:100,000 epinephrine was infiltrated in the left inferior alveolar block and then buccal and palatal infiltration around teeth numbers 5, 7, 8, 9, 10, and 15.  Tooth #18 was operated first.  A 15 blade was used to make an incision circumferentially around the tooth.  Tooth was elevated with a 301 elevator and removed from the mouth with a dental forceps.  The mesiobuccal root fractured requiring removal of bone around this root. The root was then removed with a 301 elevator.  The socket was curetted and then a 15 blade was used to make an incision around teeth numbers 5, 7, 8, 9, 10, and 15.  The periosteum was reflected with a periosteal elevator.  The teeth were elevated with a 301 elevator and removed from the mouth with the upper universal forceps.  The sockets were then curetted.  The gingiva was trimmed and then the oral cavity was inspected and suctioned, irrigated and suctioned again and then the throat pack was removed.  The sterile dressing was placed over the left mandible drainage area and then the patient was left in the care of Anesthesia for  awakening and transportation to the recovery room with plans for discharge home through Day surgery.  ESTIMATED BLOOD LOSS:  Minimum.  COMPLICATIONS:  None.  SPECIMEN:  None.  CULTURES:  Aerobic and anaerobic, the left mandible.  DRAIN:  Quarter-inch Penrose, left mandible.     Gae Bon, M.D.     SMJ/MEDQ  D:  10/25/2017  T:  10/25/2017  Job:  [id number]

## 2017-10-31 LAB — AEROBIC/ANAEROBIC CULTURE (SURGICAL/DEEP WOUND)

## 2017-11-01 ENCOUNTER — Other Ambulatory Visit: Payer: Medicare Other

## 2017-11-24 ENCOUNTER — Other Ambulatory Visit: Payer: Self-pay | Admitting: Physical Medicine & Rehabilitation

## 2017-11-25 ENCOUNTER — Ambulatory Visit
Admission: RE | Admit: 2017-11-25 | Discharge: 2017-11-25 | Disposition: A | Discharge status: Home / Self Care | Payer: Medicare Other | Source: Ambulatory Visit | Attending: Gastroenterology | Admitting: Gastroenterology

## 2017-11-25 DIAGNOSIS — R109 Unspecified abdominal pain: Secondary | ICD-10-CM

## 2017-11-25 DIAGNOSIS — R140 Abdominal distension (gaseous): Secondary | ICD-10-CM

## 2017-11-25 MED ORDER — IOPAMIDOL (ISOVUE-300) INJECTION 61%
100.0000 mL | Freq: Once | INTRAVENOUS | Status: AC | PRN
  Administered 2017-11-25: 100 mL via INTRAVENOUS

## 2017-11-27 ENCOUNTER — Other Ambulatory Visit: Payer: Self-pay | Admitting: Gastroenterology

## 2017-11-27 DIAGNOSIS — R1310 Dysphagia, unspecified: Secondary | ICD-10-CM

## 2017-11-29 ENCOUNTER — Ambulatory Visit
Admission: RE | Admit: 2017-11-29 | Discharge: 2017-11-29 | Disposition: A | Discharge status: Home / Self Care | Payer: Medicare Other | Source: Ambulatory Visit | Attending: Gastroenterology | Admitting: Gastroenterology

## 2017-11-29 DIAGNOSIS — R1310 Dysphagia, unspecified: Secondary | ICD-10-CM

## 2017-12-11 ENCOUNTER — Ambulatory Visit: Payer: Medicare Other

## 2017-12-11 ENCOUNTER — Ambulatory Visit (INDEPENDENT_AMBULATORY_CARE_PROVIDER_SITE_OTHER): Payer: Medicare Other | Admitting: Podiatry

## 2017-12-11 ENCOUNTER — Encounter: Payer: Self-pay | Admitting: Podiatry

## 2017-12-11 ENCOUNTER — Other Ambulatory Visit: Payer: Self-pay | Admitting: Podiatry

## 2017-12-11 DIAGNOSIS — I999 Unspecified disorder of circulatory system: Secondary | ICD-10-CM | POA: Diagnosis not present

## 2017-12-11 DIAGNOSIS — M779 Enthesopathy, unspecified: Secondary | ICD-10-CM

## 2017-12-11 DIAGNOSIS — M722 Plantar fascial fibromatosis: Secondary | ICD-10-CM

## 2017-12-11 DIAGNOSIS — M21619 Bunion of unspecified foot: Secondary | ICD-10-CM

## 2017-12-12 NOTE — Progress Notes (Signed)
Subjective:   Patient ID: Amy Bruce, female   DOB: 67 y.o.   MRN: [id number]   HPI Patient states doing quite a bit better with foot pain still present upon deep palpation   ROS      Objective:  Physical Exam  Neurovascular status intact with discomfort still present but improved     Assessment:  Fascial-like symptoms along with mild vascular disease and bunion deformity with good reduction of discomfort     Plan:  Discussed all 3 conditions and recommended anti-inflammatories physical therapy and support and patient will be seen back as needed

## 2017-12-22 ENCOUNTER — Other Ambulatory Visit: Payer: Self-pay | Admitting: Physical Medicine & Rehabilitation

## 2017-12-26 ENCOUNTER — Other Ambulatory Visit: Payer: Self-pay

## 2017-12-26 MED ORDER — CILOSTAZOL 50 MG PO TABS: 50.0000 mg | ORAL_TABLET | Freq: Two times a day (BID) | ORAL | 3 refills | Status: DC

## 2018-01-23 ENCOUNTER — Other Ambulatory Visit: Payer: Self-pay | Admitting: *Deleted

## 2018-01-23 MED ORDER — LIDOCAINE 5 % EX PTCH: MEDICATED_PATCH | CUTANEOUS | 1 refills | Status: DC

## 2018-01-29 ENCOUNTER — Other Ambulatory Visit: Payer: Self-pay | Admitting: Family Medicine

## 2018-01-29 DIAGNOSIS — Z1231 Encounter for screening mammogram for malignant neoplasm of breast: Secondary | ICD-10-CM

## 2018-02-09 ENCOUNTER — Other Ambulatory Visit: Payer: Self-pay | Admitting: Physical Medicine & Rehabilitation

## 2018-02-13 ENCOUNTER — Telehealth: Payer: Self-pay | Admitting: *Deleted

## 2018-02-13 NOTE — Telephone Encounter (Signed)
Amy Bruce called to get her refill on her flexeril that she says [facility] has been faxing [place] requests.  I do not see anything from them.  She is currently in [place] and wanting [place] to send refills there.  In looking at appts she does not have one scheduled and has not been seen since 08/07/18.  She cancelled the appt in January and February and no showed another one in February.  I told her she will have to be seen to get refill since his last note said return in 6 weeks and that was almost 7 months ago.  She will make an appt.

## 2018-02-27 ENCOUNTER — Other Ambulatory Visit: Payer: Self-pay | Admitting: *Deleted

## 2018-02-27 MED ORDER — CYCLOBENZAPRINE HCL 10 MG PO TABS: 10.0000 mg | ORAL_TABLET | Freq: Three times a day (TID) | ORAL | 0 refills | Status: DC | PRN

## 2018-03-18 ENCOUNTER — Inpatient Hospital Stay: Admission: RE | Admit: 2018-03-18 | Payer: Medicare Other | Source: Ambulatory Visit

## 2018-03-20 ENCOUNTER — Ambulatory Visit
Admission: RE | Admit: 2018-03-20 | Discharge: 2018-03-20 | Disposition: A | Discharge status: Home / Self Care | Payer: Medicare Other | Source: Ambulatory Visit | Attending: Family Medicine | Admitting: Family Medicine

## 2018-03-20 DIAGNOSIS — Z1231 Encounter for screening mammogram for malignant neoplasm of breast: Secondary | ICD-10-CM

## 2018-03-21 ENCOUNTER — Encounter: Payer: Self-pay | Admitting: Physical Medicine & Rehabilitation

## 2018-03-21 ENCOUNTER — Encounter: Payer: Medicare Other | Attending: Physical Medicine & Rehabilitation | Admitting: Physical Medicine & Rehabilitation

## 2018-03-21 VITALS — BP 117/73 | HR 100 | Resp 14 | Ht 60.0 in | Wt 157.0 lb

## 2018-03-21 DIAGNOSIS — Z981 Arthrodesis status: Secondary | ICD-10-CM | POA: Diagnosis not present

## 2018-03-21 DIAGNOSIS — M25511 Pain in right shoulder: Secondary | ICD-10-CM

## 2018-03-21 DIAGNOSIS — M722 Plantar fascial fibromatosis: Secondary | ICD-10-CM | POA: Insufficient documentation

## 2018-03-21 DIAGNOSIS — I10 Essential (primary) hypertension: Secondary | ICD-10-CM | POA: Insufficient documentation

## 2018-03-21 DIAGNOSIS — E538 Deficiency of other specified B group vitamins: Secondary | ICD-10-CM | POA: Diagnosis not present

## 2018-03-21 DIAGNOSIS — M542 Cervicalgia: Secondary | ICD-10-CM

## 2018-03-21 DIAGNOSIS — M4802 Spinal stenosis, cervical region: Secondary | ICD-10-CM | POA: Insufficient documentation

## 2018-03-21 DIAGNOSIS — M898X1 Other specified disorders of bone, shoulder: Secondary | ICD-10-CM

## 2018-03-21 DIAGNOSIS — M62838 Other muscle spasm: Secondary | ICD-10-CM | POA: Diagnosis not present

## 2018-03-21 DIAGNOSIS — Z720 Tobacco use: Secondary | ICD-10-CM

## 2018-03-21 DIAGNOSIS — G894 Chronic pain syndrome: Secondary | ICD-10-CM | POA: Insufficient documentation

## 2018-03-21 DIAGNOSIS — I739 Peripheral vascular disease, unspecified: Secondary | ICD-10-CM | POA: Diagnosis not present

## 2018-03-21 DIAGNOSIS — D649 Anemia, unspecified: Secondary | ICD-10-CM | POA: Diagnosis not present

## 2018-03-21 DIAGNOSIS — G5601 Carpal tunnel syndrome, right upper limb: Secondary | ICD-10-CM | POA: Insufficient documentation

## 2018-03-21 DIAGNOSIS — F17210 Nicotine dependence, cigarettes, uncomplicated: Secondary | ICD-10-CM | POA: Insufficient documentation

## 2018-03-21 DIAGNOSIS — R202 Paresthesia of skin: Secondary | ICD-10-CM

## 2018-03-21 DIAGNOSIS — F17200 Nicotine dependence, unspecified, uncomplicated: Secondary | ICD-10-CM

## 2018-03-21 DIAGNOSIS — M791 Myalgia, unspecified site: Secondary | ICD-10-CM | POA: Diagnosis not present

## 2018-03-21 DIAGNOSIS — R200 Anesthesia of skin: Secondary | ICD-10-CM | POA: Diagnosis not present

## 2018-03-21 DIAGNOSIS — G8929 Other chronic pain: Secondary | ICD-10-CM

## 2018-03-21 DIAGNOSIS — M5412 Radiculopathy, cervical region: Secondary | ICD-10-CM

## 2018-03-21 NOTE — Patient Instructions (Addendum)
Please obtain CMP

## 2018-03-21 NOTE — Progress Notes (Signed)
Subjective:    Patient ID: Amy Bruce, female    DOB: May 28, 1951, 67 y.o.   MRN: [id number]  HPI  67 y/o female with pmh/psh ofHTN, anemia, B12 deficiency, rheumatic fever, cervical fusion present for follow up for general pain.   Initially stated: Present since fusion 1991, getting worse in last couple weeks.  She fractured her humerus in 2016 and states she was slinged.  No alleviating factors. Everything exacerbates the pain.  Sharp pain.  Radiates to hands.  She has associated numbness.  Ibu does not help. Narco, flexaril helps. Denies falls. Pain limits from doing ADLs. Pt notes she had similar symptoms on left side before first fusion. Pt relocated 12/2014 from [place], [place] where she was being followed by Pain clinic.    Last clinic visit 08/07/17. Since last visit, she had several teeth extracted, notes reviewed. She notes she has been doing relatively well. She continues to take her medications.  She used Lidoderm with no benefit. She continues to smoke 2 cig/day.  Her plantar fascitis is painful.   Pain Inventory Average Pain 6 Pain Right Now 6 My pain is constant, burning, tingling and aching  In the last 24 hours, has pain interfered with the following? General activity 3 Relation with others 3 Enjoyment of life 3 What TIME of day is your pain at its worst? all Sleep (in general) Good  Pain is worse with: some activites Pain improves with: medication and TENS Relief from Meds: 7  Mobility walk without assistance how many minutes can you walk? 2 ability to climb steps?  yes do you drive?  yes Do you have any goals in this area?  yes  Function retired  Neuro/Psych weakness numbness tingling spasms  Prior Studies Any changes since last visit?  no  Physicians involved in your care Any changes since last visit?  no   No pertinent family history of neck/shoulder pain. Social History   Socioeconomic History  . Marital status: Divorced    Spouse  name: Not on file  . Number of children: Not on file  . Years of education: Not on file  . Highest education level: Not on file  Occupational History  . Not on file  Social Needs  . Financial resource strain: Not on file  . Food insecurity:    Worry: Not on file    Inability: Not on file  . Transportation needs:    Medical: Not on file    Non-medical: Not on file  Tobacco Use  . Smoking status: Former Smoker    Packs/day: 1.00    Years: 33.00    Pack years: 33.00    Types: Cigarettes    Last attempt to quit: 10/10/2017    Years since quitting: 0.4  . Smokeless tobacco: Never Used  Substance and Sexual Activity  . Alcohol use: Yes    Comment: 1-2 wk  . Drug use: No  . Sexual activity: Not on file  Lifestyle  . Physical activity:    Days per week: Not on file    Minutes per session: Not on file  . Stress: Not on file  Relationships  . Social connections:    Talks on phone: Not on file    Gets together: Not on file    Attends religious service: Not on file    Active member of club or organization: Not on file    Attends meetings of clubs or organizations: Not on file    Relationship status: Not  on file  Other Topics Concern  . Not on file  Social History Narrative  . Not on file   Past surgical history: Cervical fusion. Past medical history: HTN, anemia, B12 deficiency, rheumatic fever, chronic pain BP 117/73 (BP Location: Left Arm, Patient Position: Sitting, Cuff Size: Normal)   Pulse 100   Resp 14   Ht 5' (1.524 m)   Wt 157 lb (71.2 kg)   SpO2 97%   BMI 30.66 kg/m   Opioid Risk Score:   Fall Risk Score:  `1  Depression screen PHQ 2/9  Depression screen [facility] 2/9 12/12/2016 11/08/2016 10/11/2016  Decreased Interest 0 0 0  Down, Depressed, Hopeless 0 0 0  PHQ - 2 Score 0 0 0  Altered sleeping - - 0  Tired, decreased energy - - 0  Change in appetite - - 0  Feeling bad or failure about yourself  - - 0  Trouble concentrating - - 0  Moving slowly or  fidgety/restless - - 0  Suicidal thoughts - - 0  PHQ-9 Score - - 0  Difficult doing work/chores - - Not difficult at all   Review of Systems  Constitutional: Positive for unexpected weight change.       Weight gain in relatively short amount of time  HENT: Negative.   Eyes: Negative.   Respiratory: Negative.   Cardiovascular: Negative.   Gastrointestinal: Negative.   Endocrine: Negative.   Genitourinary: Negative.   Musculoskeletal: Positive for arthralgias, back pain, myalgias and neck pain.       Spasms  Skin: Negative.   Allergic/Immunologic: Negative.   Neurological: Positive for weakness and numbness.       Tingling   Hematological: Negative.   Psychiatric/Behavioral: Negative.   All other systems reviewed and are negative.     Objective:   Physical Exam Gen: NAD. Vital signs reviewed HENT: Normocephalic, Atraumatic. Eyes: EOMI. No discharge.  Cardio: RRR. No JVD. Pulm: B/l clear to auscultation.  Effort normal. Abd: Soft, BS+ MSK:  Gait WNL.   +TTP right traps, and right scapula.    No edema.  Neuro:   Strength 5/5 in all LE myotomes (Right side slightly limited by pain) Skin: Warm and Dry. Intact    Assessment & Plan:  67 y/o female with pmh/psh of  HTN, anemia, B12 deficiency, rheumatic fever, cervical fusion present for follow up of general pain.    1. 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   Heat/Cold, Baclofen, Lidoderm patch, Mobic ineffective, unable to tolerate Cymbalta  Xray reviewed, showing mild chronic changes  Cont HEP, completed PT  Cont TENS  Cont Gabapentin 600 TID  Cont diclofenac 50 TID, may continue per Vascular   Cont Flexaril 10 TID   Will order CMP   2. Myalgia and muscle spasms  Will consider Trigger point injections in future, does not want at present.    See #1  3. Right  CTS  See #1  Cont brace qhs  4. Tobacco abuse  Smoking 2 cig/day still  Cont to follow up with PCP for gum/meds/etc  5. Hx of post herpatic neuralgia  See #1  6. PVD  Limiting mobility  B/l LE  Cont follow up with Vascular  8. Plantar fascitis  Cont follow up with podiatry  Encourage stretching, frozen bottle, bracing  Cont TENS

## 2018-03-21 NOTE — Addendum Note (Signed)
Addended by: Delice Lesch A on: 03/21/2018 11:08 AM   Modules accepted: Level of Service

## 2018-03-24 ENCOUNTER — Other Ambulatory Visit: Payer: Self-pay | Admitting: Physical Medicine & Rehabilitation

## 2018-03-24 ENCOUNTER — Ambulatory Visit (INDEPENDENT_AMBULATORY_CARE_PROVIDER_SITE_OTHER)
Admission: RE | Admit: 2018-03-24 | Discharge: 2018-03-24 | Disposition: A | Discharge status: Home / Self Care | Payer: Medicare Other | Source: Ambulatory Visit | Attending: Acute Care | Admitting: Acute Care

## 2018-03-24 DIAGNOSIS — F17210 Nicotine dependence, cigarettes, uncomplicated: Secondary | ICD-10-CM

## 2018-03-25 ENCOUNTER — Ambulatory Visit (INDEPENDENT_AMBULATORY_CARE_PROVIDER_SITE_OTHER): Payer: Medicare Other | Admitting: Cardiovascular Disease

## 2018-03-25 ENCOUNTER — Encounter: Payer: Self-pay | Admitting: Cardiovascular Disease

## 2018-03-25 VITALS — BP 110/68 | HR 90 | Ht 60.0 in | Wt 157.4 lb

## 2018-03-25 DIAGNOSIS — Z720 Tobacco use: Secondary | ICD-10-CM | POA: Diagnosis not present

## 2018-03-25 DIAGNOSIS — I739 Peripheral vascular disease, unspecified: Secondary | ICD-10-CM

## 2018-03-25 DIAGNOSIS — M722 Plantar fascial fibromatosis: Secondary | ICD-10-CM

## 2018-03-25 DIAGNOSIS — E785 Hyperlipidemia, unspecified: Secondary | ICD-10-CM | POA: Diagnosis not present

## 2018-03-25 DIAGNOSIS — Z01818 Encounter for other preprocedural examination: Secondary | ICD-10-CM | POA: Diagnosis not present

## 2018-03-25 LAB — BASIC METABOLIC PANEL
BUN/Creatinine Ratio: 11 — ABNORMAL LOW (ref 12–28)
BUN: 14 mg/dL (ref 8–27)
CO2: 26 mmol/L (ref 20–29)
CREATININE: 1.29 mg/dL — AB (ref 0.57–1.00)
Calcium: 9.8 mg/dL (ref 8.7–10.3)
Chloride: 98 mmol/L (ref 96–106)
GFR calc non Af Amer: 43 mL/min/{1.73_m2} — ABNORMAL LOW (ref >59)
GFR, EST AFRICAN AMERICAN: 50 mL/min/{1.73_m2} — AB (ref >59)
Glucose: 99 mg/dL (ref 65–99)
Potassium: 4.4 mmol/L (ref 3.5–5.2)
Sodium: 138 mmol/L (ref 134–144)

## 2018-03-25 LAB — CBC
HEMATOCRIT: 34.1 % (ref 34.0–46.6)
Hemoglobin: 11.5 g/dL (ref 11.1–15.9)
MCH: 29.9 pg (ref 26.6–33.0)
MCHC: 33.7 g/dL (ref 31.5–35.7)
MCV: 89 fL (ref 79–97)
Platelets: 229 10*3/uL (ref 150–450)
RBC: 3.84 x10E6/uL (ref 3.77–5.28)
RDW: 14.6 % (ref 12.3–15.4)
WBC: 5.6 10*3/uL (ref 3.4–10.8)

## 2018-03-25 NOTE — Progress Notes (Signed)
Cardiology Office Note   Date:  03/25/2018   ID:  Amy Bruce, DOB May 17, 1951, MRN [id number]  PCP:  London Pepper, MD  Cardiologist:   Kathlyn Sacramento, MD   No chief complaint on file.     History of Present Illness: Amy Bruce is a 67 y.o. female who is here today for a follow-up visit regarding peripheral arterial disease.  She has prolonged history of tobacco use, essential hypertension and hyperlipidemia.  She reports previous rheumatic fever when she was 4 with a heart murmur but that has resolved.  She smokes half a pack per day and has been doing so since she was 67 years old. CT scan for lung cancer screening in May 2018 showed evidence of calcified atherosclerosis affecting the aorta and coronary arteries. She has prolonged history of plantar fasciitis with significant pain at the bottom of her feet with walking.  She is followed also for bilateral calf claudication worse on the right side.  Vascular studies in October 2018 showed an ABI of 0.54 on the right and 0.81 on the left. Duplex showed mild right common femoral artery stenosis with occluded mid SFA.  On the left side, there was mild disease affecting the SFA and common femoral artery.  She was treated with cilostazol and advised smoking cessation.  I increased atorvastatin to 40 mg daily during last visit. She reports worsening bilateral calf claudication especially on the right and she continues to struggle with plantar fasciitis.  Her symptoms happen after walking 50 feet and then she has to stop and rest for a few minutes before she can resume.    Past Medical History:  Diagnosis Date  . Anemia    in the past  . Anxiety   . Arthritis   . GERD (gastroesophageal reflux disease)   . Heart murmur    due to rheumatic fever, PCP no longer hears it  . Hyperlipidemia   . Hypertension   . Myalgia   . Neuropathy   . PAD (peripheral artery disease) ([place])   . Pre-diabetes   . Tobacco abuse     Past  Surgical History:  Procedure Laterality Date  . ABDOMINAL HYSTERECTOMY  1999  . [place]   C5/6 6/7  . COLONOSCOPY    . Left axillary cyst removal 1989    . MULTIPLE EXTRACTIONS WITH ALVEOLOPLASTY N/A 10/25/2017   Procedure: EXTRACTION NUMBERS FIVE, SEVEN, EIGHT, NINE, TEN, FIFTEEN, EIGHTEEN WITH ALVEOLOPLASTY, IRRIGATION AND DEBRIDEMENT LEFT SUBMANDIBULAR INFECTION;  Surgeon: Diona Browner, DDS;  Location: [facility];  Service: Oral Surgery;  Laterality: N/A;  . TONSILECTOMY, ADENOIDECTOMY, BILATERAL MYRINGOTOMY AND TUBES  1981  . TUBAL LIGATION  1981     Current Outpatient Medications  Medication Sig Dispense Refill  . aspirin EC 81 MG tablet Take 81 mg by mouth daily.    Marland Kitchen atorvastatin (LIPITOR) 40 MG tablet Take 1 tablet (40 mg total) by mouth daily at 6 PM. (Patient taking differently: Take 40 mg by mouth daily. ) 90 tablet 3  . Biotin 10000 MCG TABS Take 10,000 mcg by mouth daily.    . calcium carbonate (TUMS - DOSED IN MG ELEMENTAL CALCIUM) 500 MG chewable tablet Chew 1 tablet by mouth as needed for indigestion or heartburn.    . cilostazol (PLETAL) 50 MG tablet Take 1 tablet (50 mg total) by mouth 2 (two) times daily. 90 tablet 3  . cyclobenzaprine (FLEXERIL) 10 MG tablet TAKE 1 TABLET BY MOUTH THREE  TIMES DAILY AS NEEDED FOR MUSCLE SPASMS 90 tablet 2  . diclofenac (VOLTAREN) 50 MG EC tablet TAKE 1 TABLET BY MOUTH THREE TIMES DAILY 270 tablet 2  . gabapentin (NEURONTIN) 600 MG tablet TAKE 1 TABLET BY MOUTH THREE TIMES DAILY 90 tablet 3  . lidocaine (LIDODERM) 5 % UNWRAP AND APPLY 1 PATCH TO SKIN DAILY. REMOVE AND DISCARD PATCH WITHIN 12 HOURS OR AS DIRECTED BY MD. 30 patch 1  . lisinopril (PRINIVIL,ZESTRIL) 20 MG tablet Take 20 mg by mouth daily.   4  . nicotine (NICODERM CQ - DOSED IN MG/24 HOURS) 21 mg/24hr patch Place 21 mg onto the skin daily as needed (smoking cessation).     Marland Kitchen omeprazole (PRILOSEC) 20 MG capsule Take 20 mg by mouth as needed.    Marland Kitchen  oxyCODONE-acetaminophen (PERCOCET) 5-325 MG tablet Take 1-2 tablets by mouth every 4 (four) hours as needed. (Patient not taking: Reported on 03/21/2018) 40 tablet 0  . VENTOLIN HFA 108 (90 Base) MCG/ACT inhaler Inhale 1-2 puffs every 4 to 6 hours as needed for shortness of breath  1  . vitamin B-12 (CYANOCOBALAMIN) 500 MCG tablet Take 500 mcg by mouth daily.     No current facility-administered medications for this visit.     Allergies:   Duloxetine hcl and Carisoprodol    Social History:  The patient  reports that she quit smoking about 5 months ago. Her smoking use included cigarettes. She has a 33.00 pack-year smoking history. She has never used smokeless tobacco. She reports that she drinks alcohol. She reports that she does not use drugs.   Family History:  The patient's family history includes Ovarian cancer in her mother.    ROS:  Please see the history of present illness.   Otherwise, review of systems are positive for none.   All other systems are reviewed and negative.    PHYSICAL EXAM: VS:  BP 110/68 (BP Location: Left Arm, Patient Position: Sitting)   Pulse 90   Ht 5' (1.524 m)   Wt 157 lb 6.4 oz (71.4 kg)   BMI 30.74 kg/m  , BMI Body mass index is 30.74 kg/m. GEN: Well nourished, well developed, in no acute distress  HEENT: normal  Neck: no JVD, carotid bruits, or masses Cardiac: RRR; no murmurs, rubs, or gallops,no edema  Respiratory:  clear to auscultation bilaterally, normal work of breathing GI: soft, nontender, nondistended, + BS MS: no deformity or atrophy  Skin: warm and dry, no rash Neuro:  Strength and sensation are intact Psych: euthymic mood, full affect Vascular: Femoral pulses are +1 bilaterally.  Distal pulses are not palpable   EKG:  EKG is not ordered today.   Recent Labs: 10/25/2017: BUN 11; Creatinine, Ser 1.04; Hemoglobin 12.9; Platelets 221; Potassium 3.7; Sodium 138    Lipid Panel No results found for: CHOL, TRIG, HDL, CHOLHDL, VLDL,  LDLCALC, LDLDIRECT    Wt Readings from Last 3 Encounters:  03/25/18 157 lb 6.4 oz (71.4 kg)  03/21/18 157 lb (71.2 kg)  10/25/17 164 lb (74.4 kg)       No flowsheet data found.    ASSESSMENT AND PLAN:  1.  Peripheral arterial disease with severe bilateral leg claudication worse on the right side: No evidence of critical limb ischemia.   Her symptoms are clearly lifestyle limiting with no improvement with medical therapy  And decreasing tobacco use.  Due to that, I recommend proceeding with abdominal aortogram with lower extremity runoff and possible endovascular intervention.  I  discussed the procedure in details as well as risks and benefits. Plan access via the left common femoral artery.  2.  Plantar fasciitis:  Stable.   3.  Tobacco use: She cut down to 2 cigarettes daily.  4.  Hyperlipidemia: I increased atorvastatin to 40 mg daily during last visit but she is not sure if she is taking this dose or the 20 mg.  Disposition:   FU with me in 1 months  Signed,  Kathlyn Sacramento, MD  03/25/2018 9:35 AM    [place]

## 2018-03-25 NOTE — Patient Instructions (Signed)
Medication Instructions: Your physician recommends that you continue on your current medications as directed. Please refer to the Current Medication list given to you today.  If you need a refill on your cardiac medications before your next appointment, please call your pharmacy.   Follow-Up: Your physician wants you to follow-up on 8/27 at 10:20 with Dr. Fletcher Anon.   Thank you for choosing [facility] at [facility]!!       [place] [address] [place] [address] Dept: [phone] Loc: [place]  03/25/2018  You are scheduled for a Peripheral Angiogram on Wednesday, July 24 with Dr. Kathlyn Sacramento.  1. Please arrive at the [facility] (Main Entrance A) at [facility]: [address] [place], [place] [address] at 6:30 AM (two hours before your procedure to ensure your preparation). Free valet parking service is available.   Special note: Every effort is made to have your procedure done on time. Please understand that emergencies sometimes delay scheduled procedures.  2. Diet: Nothing to eat after midnight. You may have clear liquids until 5 am.  3. Labs: Labs will be done today at the [facility] office  4. Medication instructions in preparation for your procedure: None to hold  On the morning of your procedure, take your Aspirin and any morning medicines NOT listed above.  You may use sips of water.  5. Plan for one night stay--bring personal belongings. 6. Bring a current list of your medications and current insurance cards. 7. You MUST have a responsible person to drive you home. 8. Someone MUST be with you the first 24 hours after you arrive home or your discharge will be delayed. 9. Please wear clothes that are easy to get on and off and wear slip-on shoes.  Thank you for allowing [place] to care for you!   -- [facility]

## 2018-03-25 NOTE — H&P (View-Only) (Signed)
Cardiology Office Note   Date:  03/25/2018   ID:  Amy Bruce, DOB [birth year], MRN [id number]  PCP:  Amy Pepper, MD  Cardiologist:   Kathlyn Sacramento, MD   No chief complaint on file.     History of Present Illness: Amy Bruce is a 67 y.o. female who is here today for a follow-up visit regarding peripheral arterial disease.  She has prolonged history of tobacco use, essential hypertension and hyperlipidemia.  She reports previous rheumatic fever when she was 4 with a heart murmur but that has resolved.  She smokes half a pack per day and has been doing so since she was 67 years old. CT scan for lung cancer screening in May 2018 showed evidence of calcified atherosclerosis affecting the aorta and coronary arteries. She has prolonged history of plantar fasciitis with significant pain at the bottom of her feet with walking.  She is followed also for bilateral calf claudication worse on the right side.  Vascular studies in October 2018 showed an ABI of 0.54 on the right and 0.81 on the left. Duplex showed mild right common femoral artery stenosis with occluded mid SFA.  On the left side, there was mild disease affecting the SFA and common femoral artery.  She was treated with cilostazol and advised smoking cessation.  I increased atorvastatin to 40 mg daily during last visit. She reports worsening bilateral calf claudication especially on the right and she continues to struggle with plantar fasciitis.  Her symptoms happen after walking 50 feet and then she has to stop and rest for a few minutes before she can resume.    Past Medical History:  Diagnosis Date  . Anemia    in the past  . Anxiety   . Arthritis   . GERD (gastroesophageal reflux disease)   . Heart murmur    due to rheumatic fever, PCP no longer hears it  . Hyperlipidemia   . Hypertension   . Myalgia   . Neuropathy   . PAD (peripheral artery disease) ([place])   . Pre-diabetes   . Tobacco abuse     Past  Surgical History:  Procedure Laterality Date  . ABDOMINAL HYSTERECTOMY  1999  . [place]   C5/6 6/7  . COLONOSCOPY    . Left axillary cyst removal 1989    . MULTIPLE EXTRACTIONS WITH ALVEOLOPLASTY N/A 10/25/2017   Procedure: EXTRACTION NUMBERS FIVE, SEVEN, EIGHT, NINE, TEN, FIFTEEN, EIGHTEEN WITH ALVEOLOPLASTY, IRRIGATION AND DEBRIDEMENT LEFT SUBMANDIBULAR INFECTION;  Surgeon: Amy Bruce, DDS;  Location: [facility];  Service: Oral Surgery;  Laterality: N/A;  . TONSILECTOMY, ADENOIDECTOMY, BILATERAL MYRINGOTOMY AND TUBES  1981  . TUBAL LIGATION  1981     Current Outpatient Medications  Medication Sig Dispense Refill  . aspirin EC 81 MG tablet Take 81 mg by mouth daily.    Marland Kitchen atorvastatin (LIPITOR) 40 MG tablet Take 1 tablet (40 mg total) by mouth daily at 6 PM. (Patient taking differently: Take 40 mg by mouth daily. ) 90 tablet 3  . Biotin 10000 MCG TABS Take 10,000 mcg by mouth daily.    . calcium carbonate (TUMS - DOSED IN MG ELEMENTAL CALCIUM) 500 MG chewable tablet Chew 1 tablet by mouth as needed for indigestion or heartburn.    . cilostazol (PLETAL) 50 MG tablet Take 1 tablet (50 mg total) by mouth 2 (two) times daily. 90 tablet 3  . cyclobenzaprine (FLEXERIL) 10 MG tablet TAKE 1 TABLET BY MOUTH THREE  TIMES DAILY AS NEEDED FOR MUSCLE SPASMS 90 tablet 2  . diclofenac (VOLTAREN) 50 MG EC tablet TAKE 1 TABLET BY MOUTH THREE TIMES DAILY 270 tablet 2  . gabapentin (NEURONTIN) 600 MG tablet TAKE 1 TABLET BY MOUTH THREE TIMES DAILY 90 tablet 3  . lidocaine (LIDODERM) 5 % UNWRAP AND APPLY 1 PATCH TO SKIN DAILY. REMOVE AND DISCARD PATCH WITHIN 12 HOURS OR AS DIRECTED BY MD. 30 patch 1  . lisinopril (PRINIVIL,ZESTRIL) 20 MG tablet Take 20 mg by mouth daily.   4  . nicotine (NICODERM CQ - DOSED IN MG/24 HOURS) 21 mg/24hr patch Place 21 mg onto the skin daily as needed (smoking cessation).     Marland Kitchen omeprazole (PRILOSEC) 20 MG capsule Take 20 mg by mouth as needed.    Marland Kitchen  oxyCODONE-acetaminophen (PERCOCET) 5-325 MG tablet Take 1-2 tablets by mouth every 4 (four) hours as needed. (Patient not taking: Reported on 03/21/2018) 40 tablet 0  . VENTOLIN HFA 108 (90 Base) MCG/ACT inhaler Inhale 1-2 puffs every 4 to 6 hours as needed for shortness of breath  1  . vitamin B-12 (CYANOCOBALAMIN) 500 MCG tablet Take 500 mcg by mouth daily.     No current facility-administered medications for this visit.     Allergies:   Duloxetine hcl and Carisoprodol    Social History:  The patient  reports that she quit smoking about 5 months ago. Her smoking use included cigarettes. She has a 33.00 pack-year smoking history. She has never used smokeless tobacco. She reports that she drinks alcohol. She reports that she does not use drugs.   Family History:  The patient's family history includes Ovarian cancer in her mother.    ROS:  Please see the history of present illness.   Otherwise, review of systems are positive for none.   All other systems are reviewed and negative.    PHYSICAL EXAM: VS:  BP 110/68 (BP Location: Left Arm, Patient Position: Sitting)   Pulse 90   Ht 5' (1.524 m)   Wt 157 lb 6.4 oz (71.4 kg)   BMI 30.74 kg/m  , BMI Body mass index is 30.74 kg/m. GEN: Well nourished, well developed, in no acute distress  HEENT: normal  Neck: no JVD, carotid bruits, or masses Cardiac: RRR; no murmurs, rubs, or gallops,no edema  Respiratory:  clear to auscultation bilaterally, normal work of breathing GI: soft, nontender, nondistended, + BS MS: no deformity or atrophy  Skin: warm and dry, no rash Neuro:  Strength and sensation are intact Psych: euthymic mood, full affect Vascular: Femoral pulses are +1 bilaterally.  Distal pulses are not palpable   EKG:  EKG is not ordered today.   Recent Labs: 10/25/2017: BUN 11; Creatinine, Ser 1.04; Hemoglobin 12.9; Platelets 221; Potassium 3.7; Sodium 138    Lipid Panel No results found for: CHOL, TRIG, HDL, CHOLHDL, VLDL,  LDLCALC, LDLDIRECT    Wt Readings from Last 3 Encounters:  03/25/18 157 lb 6.4 oz (71.4 kg)  03/21/18 157 lb (71.2 kg)  10/25/17 164 lb (74.4 kg)       No flowsheet data found.    ASSESSMENT AND PLAN:  1.  Peripheral arterial disease with severe bilateral leg claudication worse on the right side: No evidence of critical limb ischemia.   Her symptoms are clearly lifestyle limiting with no improvement with medical therapy  And decreasing tobacco use.  Due to that, I recommend proceeding with abdominal aortogram with lower extremity runoff and possible endovascular intervention.  I  discussed the procedure in details as well as risks and benefits. Plan access via the left common femoral artery.  2.  Plantar fasciitis:  Stable.   3.  Tobacco use: She cut down to 2 cigarettes daily.  4.  Hyperlipidemia: I increased atorvastatin to 40 mg daily during last visit but she is not sure if she is taking this dose or the 20 mg.  Disposition:   FU with me in 1 months  Signed,  Kathlyn Sacramento, MD  03/25/2018 9:35 AM    [place]

## 2018-03-27 ENCOUNTER — Other Ambulatory Visit: Payer: Self-pay | Admitting: Acute Care

## 2018-03-27 DIAGNOSIS — F17210 Nicotine dependence, cigarettes, uncomplicated: Principal | ICD-10-CM

## 2018-03-27 DIAGNOSIS — Z122 Encounter for screening for malignant neoplasm of respiratory organs: Secondary | ICD-10-CM

## 2018-03-28 ENCOUNTER — Telehealth: Payer: Self-pay | Admitting: *Deleted

## 2018-03-28 ENCOUNTER — Other Ambulatory Visit: Payer: Self-pay | Admitting: Physical Medicine & Rehabilitation

## 2018-03-28 NOTE — Telephone Encounter (Signed)
Patient made aware of results and verbalized understanding.  The patient is not currently taking any NSAIDS. She stated that her PCP is following her kidney function and she will be having repeat labs the first week of August.  She will be at [facility] on 7/24 at 8 am for hydration prior to her procedure.

## 2018-03-28 NOTE — Telephone Encounter (Signed)
-----   Message from Wellington Hampshire, MD sent at 03/27/2018  1:46 PM EDT ----- Inform patient that labs mildly abnormal kidney function.  Please make sure she is not taking any nonsteroidal anti-inflammatory medication such as ibuprofen, naproxen or diclofenac which might affect kidney function.  She should increase fluid intake before the angiogram next week. We should also bring her for 4 hours of hydration and thus we should move her case to be a third case and move the third case to be first case.

## 2018-04-02 ENCOUNTER — Encounter (HOSPITAL_COMMUNITY)
Admission: RE | Disposition: A | Discharge status: Home / Self Care | Payer: Self-pay | Source: Ambulatory Visit | Attending: Cardiovascular Disease

## 2018-04-02 ENCOUNTER — Encounter (HOSPITAL_COMMUNITY): Payer: Self-pay

## 2018-04-02 ENCOUNTER — Ambulatory Visit (HOSPITAL_COMMUNITY)
Admission: RE | Admit: 2018-04-02 | Discharge: 2018-04-02 | Disposition: A | Discharge status: Home / Self Care | Payer: Medicare Other | Source: Ambulatory Visit | Attending: Cardiovascular Disease | Admitting: Cardiovascular Disease

## 2018-04-02 DIAGNOSIS — Z90710 Acquired absence of both cervix and uterus: Secondary | ICD-10-CM | POA: Diagnosis not present

## 2018-04-02 DIAGNOSIS — Z7982 Long term (current) use of aspirin: Secondary | ICD-10-CM | POA: Insufficient documentation

## 2018-04-02 DIAGNOSIS — I70213 Atherosclerosis of native arteries of extremities with intermittent claudication, bilateral legs: Secondary | ICD-10-CM | POA: Diagnosis not present

## 2018-04-02 DIAGNOSIS — I70202 Unspecified atherosclerosis of native arteries of extremities, left leg: Secondary | ICD-10-CM | POA: Diagnosis not present

## 2018-04-02 DIAGNOSIS — Z87891 Personal history of nicotine dependence: Secondary | ICD-10-CM | POA: Insufficient documentation

## 2018-04-02 DIAGNOSIS — Z9889 Other specified postprocedural states: Secondary | ICD-10-CM | POA: Diagnosis not present

## 2018-04-02 DIAGNOSIS — F419 Anxiety disorder, unspecified: Secondary | ICD-10-CM | POA: Diagnosis not present

## 2018-04-02 DIAGNOSIS — K219 Gastro-esophageal reflux disease without esophagitis: Secondary | ICD-10-CM | POA: Diagnosis not present

## 2018-04-02 DIAGNOSIS — Z888 Allergy status to other drugs, medicaments and biological substances status: Secondary | ICD-10-CM | POA: Diagnosis not present

## 2018-04-02 DIAGNOSIS — G629 Polyneuropathy, unspecified: Secondary | ICD-10-CM | POA: Insufficient documentation

## 2018-04-02 DIAGNOSIS — M791 Myalgia, unspecified site: Secondary | ICD-10-CM | POA: Diagnosis not present

## 2018-04-02 DIAGNOSIS — M1990 Unspecified osteoarthritis, unspecified site: Secondary | ICD-10-CM | POA: Insufficient documentation

## 2018-04-02 DIAGNOSIS — M722 Plantar fascial fibromatosis: Secondary | ICD-10-CM | POA: Diagnosis not present

## 2018-04-02 DIAGNOSIS — Z79899 Other long term (current) drug therapy: Secondary | ICD-10-CM | POA: Insufficient documentation

## 2018-04-02 DIAGNOSIS — E785 Hyperlipidemia, unspecified: Secondary | ICD-10-CM | POA: Diagnosis not present

## 2018-04-02 DIAGNOSIS — I10 Essential (primary) hypertension: Secondary | ICD-10-CM | POA: Insufficient documentation

## 2018-04-02 DIAGNOSIS — R7303 Prediabetes: Secondary | ICD-10-CM | POA: Insufficient documentation

## 2018-04-02 DIAGNOSIS — I739 Peripheral vascular disease, unspecified: Secondary | ICD-10-CM

## 2018-04-02 HISTORY — PX: ABDOMINAL AORTOGRAM W/LOWER EXTREMITY: CATH118223

## 2018-04-02 SURGERY — ABDOMINAL AORTOGRAM W/LOWER EXTREMITY
Anesthesia: LOCAL

## 2018-04-02 MED ORDER — SODIUM CHLORIDE 0.9% FLUSH: 3.0000 mL | Freq: Two times a day (BID) | INTRAVENOUS | Status: DC

## 2018-04-02 MED ORDER — ACETAMINOPHEN 325 MG PO TABS: 650.0000 mg | ORAL_TABLET | ORAL | Status: DC | PRN

## 2018-04-02 MED ORDER — LIDOCAINE HCL (PF) 1 % IJ SOLN
INTRAMUSCULAR | Status: AC
  Filled 2018-04-02: qty 30

## 2018-04-02 MED ORDER — SODIUM CHLORIDE 0.9 % IV SOLN: INTRAVENOUS | Status: DC

## 2018-04-02 MED ORDER — IODIXANOL 320 MG/ML IV SOLN
INTRAVENOUS | Status: DC | PRN
  Administered 2018-04-02: 97 mL via INTRA_ARTERIAL

## 2018-04-02 MED ORDER — FENTANYL CITRATE (PF) 100 MCG/2ML IJ SOLN
INTRAMUSCULAR | Status: AC
  Filled 2018-04-02: qty 2

## 2018-04-02 MED ORDER — MIDAZOLAM HCL 2 MG/2ML IJ SOLN
INTRAMUSCULAR | Status: AC
  Filled 2018-04-02: qty 2

## 2018-04-02 MED ORDER — ASPIRIN 81 MG PO CHEW: 81.0000 mg | CHEWABLE_TABLET | ORAL | Status: DC

## 2018-04-02 MED ORDER — SODIUM CHLORIDE 0.9 % WEIGHT BASED INFUSION
3.0000 mL/kg/h | INTRAVENOUS | Status: AC
  Administered 2018-04-02: 3 mL/kg/h via INTRAVENOUS

## 2018-04-02 MED ORDER — HEPARIN (PORCINE) IN NACL 1000-0.9 UT/500ML-% IV SOLN
INTRAVENOUS | Status: AC
  Filled 2018-04-02: qty 500

## 2018-04-02 MED ORDER — SODIUM CHLORIDE 0.9 % IV SOLN: 250.0000 mL | INTRAVENOUS | Status: DC | PRN

## 2018-04-02 MED ORDER — MIDAZOLAM HCL 2 MG/2ML IJ SOLN
INTRAMUSCULAR | Status: DC | PRN
  Administered 2018-04-02: 1 mg via INTRAVENOUS

## 2018-04-02 MED ORDER — LIDOCAINE HCL (PF) 1 % IJ SOLN
INTRAMUSCULAR | Status: DC | PRN
  Administered 2018-04-02: 20 mL

## 2018-04-02 MED ORDER — SODIUM CHLORIDE 0.9% FLUSH: 3.0000 mL | INTRAVENOUS | Status: DC | PRN

## 2018-04-02 MED ORDER — HEPARIN (PORCINE) IN NACL 1000-0.9 UT/500ML-% IV SOLN
INTRAVENOUS | Status: DC | PRN
  Administered 2018-04-02 (×2): 500 mL

## 2018-04-02 MED ORDER — HYDRALAZINE HCL 20 MG/ML IJ SOLN: 5.0000 mg | INTRAMUSCULAR | Status: DC | PRN

## 2018-04-02 MED ORDER — FENTANYL CITRATE (PF) 100 MCG/2ML IJ SOLN
INTRAMUSCULAR | Status: DC | PRN
  Administered 2018-04-02: 50 ug via INTRAVENOUS

## 2018-04-02 MED ORDER — LABETALOL HCL 5 MG/ML IV SOLN: 10.0000 mg | INTRAVENOUS | Status: DC | PRN

## 2018-04-02 MED ORDER — SODIUM CHLORIDE 0.9 % WEIGHT BASED INFUSION: 1.0000 mL/kg/h | INTRAVENOUS | Status: DC

## 2018-04-02 MED ORDER — ONDANSETRON HCL 4 MG/2ML IJ SOLN: 4.0000 mg | Freq: Four times a day (QID) | INTRAMUSCULAR | Status: DC | PRN

## 2018-04-02 SURGICAL SUPPLY — 11 items
CATH ANGIO 5F PIGTAIL 65CM (CATHETERS) ×2 IMPLANT
KIT MICROPUNCTURE NIT STIFF (SHEATH) ×2 IMPLANT
KIT PV (KITS) ×2 IMPLANT
SHEATH PINNACLE 5F 10CM (SHEATH) ×2 IMPLANT
SHEATH PROBE COVER 6X72 (BAG) ×2 IMPLANT
STOPCOCK MORSE 400PSI 3WAY (MISCELLANEOUS) ×2 IMPLANT
SYRINGE MEDRAD AVANTA MACH 7 (SYRINGE) ×2 IMPLANT
TRANSDUCER W/STOPCOCK (MISCELLANEOUS) ×2 IMPLANT
TRAY PV CATH (CUSTOM PROCEDURE TRAY) ×2 IMPLANT
TUBING CIL FLEX 10 FLL-RA (TUBING) ×2 IMPLANT
WIRE BENTSON .035X145CM (WIRE) ×2 IMPLANT

## 2018-04-02 NOTE — Discharge Instructions (Signed)

## 2018-04-02 NOTE — Interval H&P Note (Signed)
History and Physical Interval Note:  04/02/2018 10:56 AM  Amy Bruce  has presented today for surgery, with the diagnosis of pad  The various methods of treatment have been discussed with the patient and family. After consideration of risks, benefits and other options for treatment, the patient has consented to  Procedure(s): ABDOMINAL AORTOGRAM W/LOWER EXTREMITY (N/A) as a surgical intervention .  The patient's history has been reviewed, patient examined, no change in status, stable for surgery.  I have reviewed the patient's chart and labs.  Questions were answered to the patient's satisfaction.     Kathlyn Sacramento

## 2018-04-03 ENCOUNTER — Encounter (HOSPITAL_COMMUNITY): Payer: Self-pay | Admitting: Cardiovascular Disease

## 2018-04-24 ENCOUNTER — Ambulatory Visit (INDEPENDENT_AMBULATORY_CARE_PROVIDER_SITE_OTHER): Payer: Medicare Other | Admitting: Podiatry

## 2018-04-24 ENCOUNTER — Encounter: Payer: Self-pay | Admitting: Podiatry

## 2018-04-24 ENCOUNTER — Ambulatory Visit (INDEPENDENT_AMBULATORY_CARE_PROVIDER_SITE_OTHER): Payer: Medicare Other

## 2018-04-24 ENCOUNTER — Other Ambulatory Visit: Payer: Self-pay | Admitting: Podiatry

## 2018-04-24 DIAGNOSIS — M79674 Pain in right toe(s): Secondary | ICD-10-CM

## 2018-04-24 DIAGNOSIS — M79671 Pain in right foot: Secondary | ICD-10-CM

## 2018-04-24 DIAGNOSIS — M722 Plantar fascial fibromatosis: Secondary | ICD-10-CM

## 2018-04-24 DIAGNOSIS — M79672 Pain in left foot: Secondary | ICD-10-CM

## 2018-04-24 DIAGNOSIS — I999 Unspecified disorder of circulatory system: Secondary | ICD-10-CM

## 2018-04-24 DIAGNOSIS — B351 Tinea unguium: Secondary | ICD-10-CM

## 2018-04-24 MED ORDER — TRIAMCINOLONE ACETONIDE 10 MG/ML IJ SUSP
10.0000 mg | Freq: Once | INTRAMUSCULAR | Status: AC
  Administered 2018-04-24: 10 mg

## 2018-04-24 NOTE — Progress Notes (Signed)
Subjective:   Patient ID: Amy Bruce, female   DOB: 67 y.o.   MRN: [id number]   HPI Patient presents with pain in the mid arch area bilateral with history of vascular disease which is being followed by a vascular doctor and will be seen at the end of the month along with incurvation of the right hallux medial border that is painful and she cannot cut herself and does have risk because of vascular disease   ROS      Objective:  Physical Exam  Significant circulatory disease right over left with patient found to have mid arch fasciitis bilateral which may be related also to circulatory issues and incurvated right hallux medial border that is painful     Assessment:  Chronic nail disease vascular disease which I educated on greatly today and probable mid arch fasciitis bilateral     Plan:  I explained to her the fact that a lot of this is related to her vascular issues and I want her to see her doctor with consideration for possible revascularization right leg.  I did do mid arch injection to try to give her temporary relief 3 mg Kenalog problem Xylocaine bilateral and using sterile instrumentation I debrided the nailbed on the right hallux taking the corner out carefully with no bleeding or iatrogenic issues.  Again spent a great deal time going over and educating her on vascular disease and its relationship to her problems that she has  X-ray indicates that there is no indications of fracture or advanced arthritis with mild depression of the arch

## 2018-05-06 ENCOUNTER — Ambulatory Visit: Payer: Medicare Other | Admitting: Cardiovascular Disease

## 2018-05-08 ENCOUNTER — Other Ambulatory Visit: Payer: Self-pay | Admitting: Physical Medicine & Rehabilitation

## 2018-05-20 ENCOUNTER — Encounter: Payer: Self-pay | Admitting: Cardiovascular Disease

## 2018-05-20 ENCOUNTER — Ambulatory Visit (INDEPENDENT_AMBULATORY_CARE_PROVIDER_SITE_OTHER): Payer: Medicare Other | Admitting: Cardiovascular Disease

## 2018-05-20 VITALS — BP 142/70 | HR 95 | Ht 60.0 in | Wt 159.0 lb

## 2018-05-20 DIAGNOSIS — Z01818 Encounter for other preprocedural examination: Secondary | ICD-10-CM

## 2018-05-20 DIAGNOSIS — E785 Hyperlipidemia, unspecified: Secondary | ICD-10-CM

## 2018-05-20 DIAGNOSIS — Z720 Tobacco use: Secondary | ICD-10-CM

## 2018-05-20 DIAGNOSIS — I739 Peripheral vascular disease, unspecified: Secondary | ICD-10-CM

## 2018-05-20 DIAGNOSIS — R0602 Shortness of breath: Secondary | ICD-10-CM | POA: Diagnosis not present

## 2018-05-20 NOTE — Progress Notes (Signed)
Cardiology Office Note   Date:  05/20/2018   ID:  Amy Bruce, DOB 10-16-1950, MRN [id number]  PCP:  London Pepper, MD  Cardiologist:   Kathlyn Sacramento, MD   Chief Complaint  Patient presents with  . Follow-up    pt denied chest pain      History of Present Illness: Amy Bruce is a 67 y.o. female who is here today for a follow-up visit regarding peripheral arterial disease.  She has prolonged history of tobacco use, essential hypertension and hyperlipidemia.  She reports previous rheumatic fever when she was 4 with a heart murmur but that has resolved.  She smokes half a pack per day and has been doing so since she was 67 years old. CT scan for lung cancer screening in May 2018 showed evidence of calcified atherosclerosis affecting the aorta and coronary arteries. She has prolonged history of plantar fasciitis with significant pain at the bottom of her feet with walking.  She is followed also for bilateral calf claudication worse on the right side.  Vascular studies in October 2018 showed an ABI of 0.54 on the right and 0.81 on the left. Duplex showed mild right common femoral artery stenosis with occluded mid SFA.  On the left side, there was mild disease affecting the SFA and common femoral artery.  She had worsening claudication and thus I proceeded with angiography in July which showed no significant aortoiliac disease.  On the left side, there was moderate ostial left SFA stenosis with two-vessel runoff below the knee.  On the right, there was severe diffuse disease affecting the proximal and ostial SFA with medium length occlusion of the mid to distal segment with reconstitution via collaterals.  Three-vessel runoff below the knee but the mid to distal anterior and posterior tibial arteries were occluded with reconstitution distally via collaterals from the peroneal artery.  She was noted to have relatively small vessels size.  She is still extremely limited by severe  right leg claudication as well as significant discomfort in both heels.  She has known plantar fasciitis.  Podiatry is considering some surgery but hesitant to do so due to their significant vascular disease.    Past Medical History:  Diagnosis Date  . Anemia    in the past  . Anxiety   . Arthritis   . GERD (gastroesophageal reflux disease)   . Heart murmur    due to rheumatic fever, PCP no longer hears it  . Hyperlipidemia   . Hypertension   . Myalgia   . Neuropathy   . PAD (peripheral artery disease) ([place])   . Pre-diabetes   . Tobacco abuse     Past Surgical History:  Procedure Laterality Date  . ABDOMINAL AORTOGRAM W/LOWER EXTREMITY N/A 04/02/2018   Procedure: ABDOMINAL AORTOGRAM W/LOWER EXTREMITY;  Surgeon: Wellington Hampshire, MD;  Location: [facility];  Service: Cardiovascular;  Laterality: N/A;  . ABDOMINAL HYSTERECTOMY  1999  . [place]   C5/6 6/7  . COLONOSCOPY    . Left axillary cyst removal 1989    . MULTIPLE EXTRACTIONS WITH ALVEOLOPLASTY N/A 10/25/2017   Procedure: EXTRACTION NUMBERS FIVE, SEVEN, EIGHT, NINE, TEN, FIFTEEN, EIGHTEEN WITH ALVEOLOPLASTY, IRRIGATION AND DEBRIDEMENT LEFT SUBMANDIBULAR INFECTION;  Surgeon: Diona Browner, DDS;  Location: [facility];  Service: Oral Surgery;  Laterality: N/A;  . TONSILECTOMY, ADENOIDECTOMY, BILATERAL MYRINGOTOMY AND TUBES  1981  . TUBAL LIGATION  1981     Current Outpatient Medications  Medication  Sig Dispense Refill  . amoxicillin (AMOXIL) 500 MG tablet DNT  1  . aspirin EC 81 MG tablet Take 81 mg by mouth daily.    Marland Kitchen atorvastatin (LIPITOR) 20 MG tablet Take 20 mg by mouth daily at 6 PM.    . calcium carbonate (TUMS - DOSED IN MG ELEMENTAL CALCIUM) 500 MG chewable tablet Chew 2 tablets by mouth daily as needed for indigestion or heartburn.     . cilostazol (PLETAL) 50 MG tablet Take 1 tablet (50 mg total) by mouth 2 (two) times daily. 90 tablet 3  . cyclobenzaprine (FLEXERIL) 10 MG tablet TAKE 1  TABLET BY MOUTH THREE TIMES DAILY AS NEEDED FOR MUSCLE SPASMS 90 tablet 2  . diclofenac (VOLTAREN) 50 MG EC tablet TAKE 1 TABLET BY MOUTH THREE TIMES DAILY 270 tablet 2  . gabapentin (NEURONTIN) 600 MG tablet TAKE 1 TABLET BY MOUTH THREE TIMES DAILY 90 tablet 3  . lidocaine (LIDODERM) 5 % UNWRAP AND APPLY 1 PATCH TO SKIN DAILY. REMOVE AND DISCARD PATCH WITHIN 12 HOURS OR AS DIRECTED BY MD. (Patient taking differently: Place 1 patch onto the skin daily as needed (for pain). ) 30 patch 1  . lisinopril (PRINIVIL,ZESTRIL) 20 MG tablet Take 20 mg by mouth daily.   4  . nicotine (NICODERM CQ - DOSED IN MG/24 HOURS) 21 mg/24hr patch Place 21 mg onto the skin daily as needed (smoking cessation).     Marland Kitchen omeprazole (PRILOSEC) 20 MG capsule Take 20 mg by mouth daily.     . Tetrahydrozoline HCl (VISINE OP) Place 2 drops into both eyes daily as needed (for dry eyes).    . VENTOLIN HFA 108 (90 Base) MCG/ACT inhaler Inhale 1-2 puffs every 4 to 6 hours as needed for shortness of breath  1  . vitamin B-12 (CYANOCOBALAMIN) 500 MCG tablet Take 500 mcg by mouth daily.     No current facility-administered medications for this visit.     Allergies:   Duloxetine hcl and Carisoprodol    Social History:  The patient  reports that she quit smoking about 7 months ago. Her smoking use included cigarettes. She has a 33.00 pack-year smoking history. She has never used smokeless tobacco. She reports that she drinks alcohol. She reports that she does not use drugs.   Family History:  The patient's family history includes Ovarian cancer in her mother.    ROS:  Please see the history of present illness.   Otherwise, review of systems are positive for none.   All other systems are reviewed and negative.    PHYSICAL EXAM: VS:  BP (!) 142/70   Pulse 95   Ht 5' (1.524 m)   Wt 159 lb (72.1 kg)   BMI 31.05 kg/m  , BMI Body mass index is 31.05 kg/m. GEN: Well nourished, well developed, in no acute distress  HEENT: normal    Neck: no JVD, carotid bruits, or masses Cardiac: RRR; no murmurs, rubs, or gallops,no edema  Respiratory:  clear to auscultation bilaterally, normal work of breathing GI: soft, nontender, nondistended, + BS MS: no deformity or atrophy  Skin: warm and dry, no rash Neuro:  Strength and sensation are intact Psych: euthymic mood, full affect Vascular: Femoral pulses are +1 bilaterally.  Distal pulses are not palpable   EKG:  EKG is  ordered today. EKG showed normal sinus rhythm with poor R wave progression in the anterior leads.  Recent Labs: 03/25/2018: BUN 14; Creatinine, Ser 1.29; Hemoglobin 11.5; Platelets 229; Potassium  4.4; Sodium 138    Lipid Panel No results found for: CHOL, TRIG, HDL, CHOLHDL, VLDL, LDLCALC, LDLDIRECT    Wt Readings from Last 3 Encounters:  05/20/18 159 lb (72.1 kg)  04/02/18 157 lb (71.2 kg)  03/25/18 157 lb 6.4 oz (71.4 kg)       No flowsheet data found.    ASSESSMENT AND PLAN:  1.  Peripheral arterial disease with severe right leg claudication and mild left leg claudication: No evidence of critical limb ischemia.  In addition, she has known plantar fasciitis which causes a lot of her discomfort. I discussed with her the natural history and management of claudication.  She does not think she can continue with this level of discomfort and she would like some revascularization done on the right side. I reviewed the angiogram with her, given the long diffuse disease in the right SFA with occlusion in the mid to distal segment and the involvement of the ostium, I think her best option is femoral-popliteal bypass for long-term patency. I am referring her to vascular surgery for this.  2.  Plantar fasciitis:  Stable.   3.  Tobacco use: She cut down to 2 cigarettes daily.  I advised her to quit completely.  4.  Hyperlipidemia: Continue atorvastatin 40 mg daily.  5.  Preop cardiovascular evaluation: The patient has no prior cardiac history.  She has  mild exertional dyspnea and overall poor functional capacity due to claudication.  Thus, I am going to obtain a [employer].   Disposition:   FU with me in 4 months  Signed,  Kathlyn Sacramento, MD  05/20/2018 8:51 AM    [place]

## 2018-05-20 NOTE — Patient Instructions (Signed)
Medication Instructions:  Your physician recommends that you continue on your current medications as directed. Please refer to the Current Medication list given to you today.   Labwork: None ordered  Testing/Procedures: Your physician has requested that you have a lexiscan myoview. For further information please visit [email or web address]. Please follow instruction sheet, as given.  Follow-Up: Your physician recommends that you schedule a follow-up appointment in: 3 months with Dr.Arida  You have been referred to Vascular and Vein Specialist for femoral bypass consideration  Any Other Special Instructions Will Be Listed Below (If Applicable).     If you need a refill on your cardiac medications before your next appointment, please call your pharmacy.

## 2018-05-21 ENCOUNTER — Telehealth (HOSPITAL_COMMUNITY): Payer: Self-pay

## 2018-05-21 NOTE — Telephone Encounter (Signed)
Encounter complete. 

## 2018-05-23 ENCOUNTER — Ambulatory Visit (HOSPITAL_COMMUNITY)
Admission: RE | Admit: 2018-05-23 | Discharge: 2018-05-23 | Disposition: A | Discharge status: Home / Self Care | Payer: Medicare Other | Source: Ambulatory Visit | Attending: Cardiovascular Disease | Admitting: Cardiovascular Disease

## 2018-05-23 DIAGNOSIS — R0602 Shortness of breath: Secondary | ICD-10-CM

## 2018-05-23 DIAGNOSIS — Z01818 Encounter for other preprocedural examination: Secondary | ICD-10-CM | POA: Diagnosis present

## 2018-05-23 LAB — MYOCARDIAL PERFUSION IMAGING
CHL CUP NUCLEAR SSS: 6
CSEPPHR: 101 {beats}/min
LV dias vol: 60 mL (ref 46–106)
LV sys vol: 15 mL
Rest HR: 83 {beats}/min
SDS: 5
SRS: 1
TID: 1.16

## 2018-05-23 MED ORDER — REGADENOSON 0.4 MG/5ML IV SOLN
0.4000 mg | Freq: Once | INTRAVENOUS | Status: AC
  Administered 2018-05-23: 0.4 mg via INTRAVENOUS

## 2018-05-23 MED ORDER — TECHNETIUM TC 99M TETROFOSMIN IV KIT
30.1000 | PACK | Freq: Once | INTRAVENOUS | Status: AC | PRN
  Administered 2018-05-23: 30.1 via INTRAVENOUS
  Filled 2018-05-23: qty 31

## 2018-05-23 MED ORDER — TECHNETIUM TC 99M TETROFOSMIN IV KIT
10.0000 | PACK | Freq: Once | INTRAVENOUS | Status: AC | PRN
  Administered 2018-05-23: 10 via INTRAVENOUS
  Filled 2018-05-23: qty 10

## 2018-06-22 ENCOUNTER — Other Ambulatory Visit: Payer: Self-pay | Admitting: Physical Medicine & Rehabilitation

## 2018-07-11 ENCOUNTER — Other Ambulatory Visit: Payer: Self-pay

## 2018-07-11 DIAGNOSIS — I739 Peripheral vascular disease, unspecified: Secondary | ICD-10-CM

## 2018-07-15 ENCOUNTER — Encounter

## 2018-07-15 ENCOUNTER — Encounter: Payer: Self-pay | Admitting: Vascular Surgery

## 2018-07-15 ENCOUNTER — Ambulatory Visit (HOSPITAL_COMMUNITY)
Admission: RE | Admit: 2018-07-15 | Discharge: 2018-07-15 | Disposition: A | Discharge status: Home / Self Care | Payer: Medicare Other | Source: Ambulatory Visit | Attending: Vascular Surgery | Admitting: Vascular Surgery

## 2018-07-15 ENCOUNTER — Other Ambulatory Visit: Payer: Self-pay

## 2018-07-15 ENCOUNTER — Ambulatory Visit (INDEPENDENT_AMBULATORY_CARE_PROVIDER_SITE_OTHER): Payer: Medicare Other | Admitting: Vascular Surgery

## 2018-07-15 VITALS — BP 149/82 | HR 108 | Temp 97.7°F | Resp 16 | Ht 60.0 in | Wt 158.0 lb

## 2018-07-15 DIAGNOSIS — I739 Peripheral vascular disease, unspecified: Secondary | ICD-10-CM | POA: Diagnosis present

## 2018-07-15 NOTE — Progress Notes (Signed)
[facility] [place]  Patient name: Amy Bruce MRN: [id number] DOB: Feb 20, 1951 Sex: female  REASON FOR CONSULT: Gus lower extremity arterial insufficiency and pain  HPI: Amy Bruce is a 67 y.o. female, who is here today for discussion of lower extremity arterial insufficiency.  She is a very pleasant 67 year old female.  She has several components of her lower extremity discomfort.  She has been diagnosed with plantar fasciitis.  She reports that she has pain in both legs that began in her feet and extend up through her calves and thighs.  This is equal in her right and left leg.  She does have typical right calf claudication symptoms as well.  When I question her which is more limiting she reports that this is equal and both limit her with the bilateral pain in her total legs and also her calf claudication.  She has no history of tissue loss.  She has a long smoking history but reports she is down to 2 cigarettes a day, one in the morning and 1 in the evening.  I certainly have congratulated her on this and again explained the critical importance of smoking cessation.  Past Medical History:  Diagnosis Date  . Anemia    in the past  . Anxiety   . Arthritis   . GERD (gastroesophageal reflux disease)   . Heart murmur    due to rheumatic fever, PCP no longer hears it  . Hyperlipidemia   . Hypertension   . Myalgia   . Neuropathy   . PAD (peripheral artery disease) ([place])   . Pre-diabetes   . Tobacco abuse     Family History  Problem Relation Age of Onset  . Ovarian cancer Mother     SOCIAL HISTORY: Social History   Socioeconomic History  . Marital status: Divorced    Spouse name: Not on file  . Number of children: Not on file  . Years of education: Not on file  . Highest education level: Not on file  Occupational History  . Not on file  Social Needs  . Financial resource strain: Not on file  . Food  insecurity:    Worry: Not on file    Inability: Not on file  . Transportation needs:    Medical: Not on file    Non-medical: Not on file  Tobacco Use  . Smoking status: Current Every Day Smoker    Packs/day: 1.00    Years: 33.00    Pack years: 33.00    Types: Cigarettes  . Smokeless tobacco: Never Used  . Tobacco comment: 2 cigarettes per day  Substance and Sexual Activity  . Alcohol use: Yes    Comment: 1-2 wk  . Drug use: No  . Sexual activity: Not on file  Lifestyle  . Physical activity:    Days per week: Not on file    Minutes per session: Not on file  . Stress: Not on file  Relationships  . Social connections:    Talks on phone: Not on file    Gets together: Not on file    Attends religious service: Not on file    Active member of club or organization: Not on file    Attends meetings of clubs or organizations: Not on file    Relationship status: Not on file  . Intimate partner violence:    Fear of current or ex partner: Not on file    Emotionally abused: Not on file  Physically abused: Not on file    Forced sexual activity: Not on file  Other Topics Concern  . Not on file  Social History Narrative  . Not on file    Allergies  Allergen Reactions  . Duloxetine Hcl Swelling, Other (See Comments) and Hypertension    OTHER REACTIONS UNSPECIFIED   . Carisoprodol Swelling and Other (See Comments)    SWELLING REACTION UNSPECIFIED     Current Outpatient Medications  Medication Sig Dispense Refill  . aspirin EC 81 MG tablet Take 81 mg by mouth daily.    Marland Kitchen atorvastatin (LIPITOR) 20 MG tablet Take 20 mg by mouth daily at 6 PM.    . calcium carbonate (TUMS - DOSED IN MG ELEMENTAL CALCIUM) 500 MG chewable tablet Chew 2 tablets by mouth daily as needed for indigestion or heartburn.     . cilostazol (PLETAL) 50 MG tablet Take 1 tablet (50 mg total) by mouth 2 (two) times daily. 90 tablet 3  . cyclobenzaprine (FLEXERIL) 10 MG tablet TAKE 1 TABLET BY MOUTH THREE TIMES  DAILY AS NEEDED FOR MUSCLE SPASMS 90 tablet 2  . diclofenac (VOLTAREN) 50 MG EC tablet TAKE 1 TABLET BY MOUTH THREE TIMES DAILY 270 tablet 2  . gabapentin (NEURONTIN) 600 MG tablet TAKE 1 TABLET BY MOUTH THREE TIMES DAILY 90 tablet 3  . lidocaine (LIDODERM) 5 % UNWRAP AND APPLY 1 PATCH TO SKIN DAILY. REMOVE AND DISCARD PATCH WITHIN 12 HOURS OR AS DIRECTED BY MD. (Patient taking differently: Place 1 patch onto the skin daily as needed (for pain). ) 30 patch 1  . lisinopril (PRINIVIL,ZESTRIL) 20 MG tablet Take 20 mg by mouth daily.   4  . nicotine (NICODERM CQ - DOSED IN MG/24 HOURS) 21 mg/24hr patch Place 21 mg onto the skin daily as needed (smoking cessation).     Marland Kitchen omeprazole (PRILOSEC) 20 MG capsule Take 20 mg by mouth daily.     . Tetrahydrozoline HCl (VISINE OP) Place 2 drops into both eyes daily as needed (for dry eyes).    . VENTOLIN HFA 108 (90 Base) MCG/ACT inhaler Inhale 1-2 puffs every 4 to 6 hours as needed for shortness of breath  1  . vitamin B-12 (CYANOCOBALAMIN) 500 MCG tablet Take 500 mcg by mouth daily.     No current facility-administered medications for this visit.     REVIEW OF SYSTEMS:  [X]  denotes positive finding, [ ]  denotes negative finding Cardiac  Comments:  Chest pain or chest pressure:    Shortness of breath upon exertion:    Short of breath when lying flat:    Irregular heart rhythm:        Vascular    Pain in calf, thigh, or hip brought on by ambulation: x   Pain in feet at night that wakes you up from your sleep:     Blood clot in your veins:    Leg swelling:         Pulmonary    Oxygen at home:    Productive cough:     Wheezing:         Neurologic    Sudden weakness in arms or legs:     Sudden numbness in arms or legs:     Sudden onset of difficulty speaking or slurred speech:    Temporary loss of vision in one eye:     Problems with dizziness:         Gastrointestinal    Blood in stool:  Vomited blood:         Genitourinary    Burning  when urinating:     Blood in urine: x       Psychiatric    Major depression:         Hematologic    Bleeding problems:    Problems with blood clotting too easily:        Skin    Rashes or ulcers:        Constitutional    Fever or chills:      PHYSICAL EXAM: Vitals:   07/15/18 0927  BP: (!) 149/82  Pulse: (!) 108  Resp: 16  Temp: 97.7 F (36.5 C)  TempSrc: Oral  SpO2: 100%  Weight: 158 lb (71.7 kg)  Height: 5' (1.524 m)    GENERAL: The patient is a well-nourished female, in no acute distress. The vital signs are documented above. CARDIOVASCULAR: Carotid arteries without bruits bilaterally.  2+ radial pulses bilaterally.  Does have palpable femoral pulses.  I do not palpate pedal pulses in her right or left leg. PULMONARY: There is good air exchange  ABDOMEN: Soft and non-tender  MUSCULOSKELETAL: There are no major deformities or cyanosis. NEUROLOGIC: No focal weakness or paresthesias are detected. SKIN: There are no ulcers or rashes noted. PSYCHIATRIC: The patient has a normal affect.  DATA:  Noninvasive studies reveal ankle arm index of 0.59 on the right and 0.92 on the left  I have reviewed her arteriogram films from July 2019.  This does show occlusion of her right superficial femoral artery with reconstitution of the above-knee popliteal artery.  She then has occlusion of her anterior tibial and posterior tibial and mid calf with distal reconstitution.  She has primary runoff via the peroneal artery to the foot  MEDICAL ISSUES: A long discussion with the patient regarding options.  I agree with Dr.Arida that stenting of her superficial femoral artery would in all likelihood of of limited durability and I am concerned regarding her one-vessel runoff.  Did explain the option of femoral to popliteal bypass.  I imaged her saphenous veins with SonoSite and these are extremely small bilaterally and I do not feel that there would be usable for bypass.  I did explain the  limited durability of the prosthetic bypass graft and explained that this is usually reserved for limb salvage.  I did explain that since she is having multiple sources for her leg pain, in my opinion would be of limited benefit to relieve her claudication when she continues to have bilateral pain related to plantar fasciitis.  Again reassured her that there is minimal risk for limb threatening ischemia but that this could progress over time and would reserve bypass for more severe ischemia.  She is comfortable with this decision and will continue to monitor her symptoms.  She will notify should she develop worsening symptoms   Rosetta Posner, MD [facility] Tel [phone] Pager [phone]

## 2018-07-18 ENCOUNTER — Encounter: Payer: Self-pay | Admitting: Podiatry

## 2018-07-18 ENCOUNTER — Ambulatory Visit (INDEPENDENT_AMBULATORY_CARE_PROVIDER_SITE_OTHER): Payer: Medicare Other | Admitting: Podiatry

## 2018-07-18 DIAGNOSIS — M722 Plantar fascial fibromatosis: Secondary | ICD-10-CM

## 2018-07-18 DIAGNOSIS — I999 Unspecified disorder of circulatory system: Secondary | ICD-10-CM

## 2018-07-18 DIAGNOSIS — B351 Tinea unguium: Secondary | ICD-10-CM

## 2018-07-18 DIAGNOSIS — M79674 Pain in right toe(s): Secondary | ICD-10-CM

## 2018-07-18 MED ORDER — TRIAMCINOLONE ACETONIDE 10 MG/ML IJ SUSP
10.0000 mg | Freq: Once | INTRAMUSCULAR | Status: AC
  Administered 2018-07-18: 10 mg

## 2018-07-20 NOTE — Progress Notes (Signed)
Subjective:   Patient ID: Amy Bruce, female   DOB: 67 y.o.   MRN: [id number]   HPI Patient states she went to the vascular doctor that we had referred to and she does have blockages in the right but they are not yet willing to do surgery and she continues to have discomfort in her arch region bilateral and even the left despite good circulatory status has discomfort.  Patient has irritation of the right big toenail medial border that we have worked on in the past with no drainage or redness   ROS      Objective:  Physical Exam  Neurovascular status unchanged with diminished circulation right side with inflammation mid arch area left and right and incurvation of the right hallux nail medial border localized with no erythema edema or drainage noted     Assessment:  Plantar fasciitis left over right with vascular disease probably part of the pain she has right with encouragement to continue to see the vascular doctor with hopeful revascularization at one point future.  I injected the left plantar fascia mid arch 3 mg Kenalog 5 Milgram Xylocaine applied fascial brace to try to lift the arch and I went ahead and debrided the nailbed right hallux with no drainage noted and applied Band-Aid.  Patient will be seen back as needed     Plan:  Above condition reviewed with patient

## 2018-07-22 ENCOUNTER — Other Ambulatory Visit: Payer: Self-pay | Admitting: Physical Medicine & Rehabilitation

## 2018-08-15 ENCOUNTER — Ambulatory Visit: Payer: Medicare Other | Admitting: Podiatry

## 2018-08-19 ENCOUNTER — Ambulatory Visit (INDEPENDENT_AMBULATORY_CARE_PROVIDER_SITE_OTHER): Payer: Medicare Other | Admitting: Cardiovascular Disease

## 2018-08-19 ENCOUNTER — Encounter: Payer: Self-pay | Admitting: Cardiovascular Disease

## 2018-08-19 VITALS — BP 130/70 | HR 95 | Ht 60.0 in | Wt 161.6 lb

## 2018-08-19 DIAGNOSIS — I739 Peripheral vascular disease, unspecified: Secondary | ICD-10-CM

## 2018-08-19 DIAGNOSIS — Z720 Tobacco use: Secondary | ICD-10-CM | POA: Diagnosis not present

## 2018-08-19 DIAGNOSIS — E785 Hyperlipidemia, unspecified: Secondary | ICD-10-CM

## 2018-08-19 MED ORDER — CILOSTAZOL 100 MG PO TABS: 100.0000 mg | ORAL_TABLET | Freq: Two times a day (BID) | ORAL | 3 refills | Status: DC

## 2018-08-19 NOTE — Progress Notes (Signed)
Cardiology Office Note   Date:  08/19/2018   ID:  Amy Bruce, DOB 08-13-1951, MRN [id number]  PCP:  London Pepper, MD  Cardiologist:   Kathlyn Sacramento, MD   Chief Complaint  Patient presents with  . Follow-up    pt denied chest pain      History of Present Illness: Amy Bruce is a 67 y.o. female who is here today for a follow-up visit regarding peripheral arterial disease.  She has prolonged history of tobacco use, essential hypertension and hyperlipidemia.  She reports previous rheumatic fever when she was 4 with a heart murmur but that has resolved.  She smokes half a pack per day and has been doing so since she was 67 years old. CT scan for lung cancer screening in May 2018 showed evidence of calcified atherosclerosis affecting the aorta and coronary arteries. She has prolonged history of plantar fasciitis with significant pain at the bottom of her feet with walking.  She is followed also for bilateral calf claudication worse on the right side.  Vascular studies in October 2018 showed an ABI of 0.54 on the right and 0.81 on the left. Duplex showed mild right common femoral artery stenosis with occluded mid SFA.  On the left side, there was mild disease affecting the SFA and common femoral artery.  Angiography in July, 2019 showed no significant aortoiliac disease.  On the left side, there was moderate ostial left SFA stenosis with two-vessel runoff below the knee.  On the right, there was severe diffuse disease affecting the proximal and ostial SFA with medium length occlusion of the mid to distal segment with reconstitution via collaterals.  Three-vessel runoff below the knee but the mid to distal anterior and posterior tibial arteries were occluded with reconstitution distally via collaterals from the peroneal artery.  She was noted to have relatively small vessels size.  I referred the patient to vascular surgery to consider right femoropopliteal bypass and she was  seen by Dr. Donnetta Hutching.  After discussion of risks and benefits, it was decided to continue with medical therapy.  She reports stable claudication.  She also suffers from plantar fasciitis on both sides.  Lexiscan Myoview in September showed no evidence of ischemia with normal ejection fraction.   Past Medical History:  Diagnosis Date  . Anemia    in the past  . Anxiety   . Arthritis   . GERD (gastroesophageal reflux disease)   . Heart murmur    due to rheumatic fever, PCP no longer hears it  . Hyperlipidemia   . Hypertension   . Myalgia   . Neuropathy   . PAD (peripheral artery disease) ([place])   . Pre-diabetes   . Tobacco abuse     Past Surgical History:  Procedure Laterality Date  . ABDOMINAL AORTOGRAM W/LOWER EXTREMITY N/A 04/02/2018   Procedure: ABDOMINAL AORTOGRAM W/LOWER EXTREMITY;  Surgeon: Wellington Hampshire, MD;  Location: [facility];  Service: Cardiovascular;  Laterality: N/A;  . ABDOMINAL HYSTERECTOMY  1999  . [place]   C5/6 6/7  . COLONOSCOPY    . Left axillary cyst removal 1989    . MULTIPLE EXTRACTIONS WITH ALVEOLOPLASTY N/A 10/25/2017   Procedure: EXTRACTION NUMBERS FIVE, SEVEN, EIGHT, NINE, TEN, FIFTEEN, EIGHTEEN WITH ALVEOLOPLASTY, IRRIGATION AND DEBRIDEMENT LEFT SUBMANDIBULAR INFECTION;  Surgeon: Diona Browner, DDS;  Location: [facility];  Service: Oral Surgery;  Laterality: N/A;  . TONSILECTOMY, ADENOIDECTOMY, BILATERAL MYRINGOTOMY AND TUBES  1981  . TUBAL  LIGATION  1981     Current Outpatient Medications  Medication Sig Dispense Refill  . aspirin EC 81 MG tablet Take 81 mg by mouth daily.    Marland Kitchen atorvastatin (LIPITOR) 20 MG tablet Take 20 mg by mouth daily at 6 PM.    . calcium carbonate (TUMS - DOSED IN MG ELEMENTAL CALCIUM) 500 MG chewable tablet Chew 2 tablets by mouth daily as needed for indigestion or heartburn.     . cilostazol (PLETAL) 50 MG tablet Take 1 tablet (50 mg total) by mouth 2 (two) times daily. 90 tablet 3  .  cyclobenzaprine (FLEXERIL) 10 MG tablet TAKE 1 TABLET BY MOUTH THREE TIMES DAILY AS NEEDED FOR MUSCLE SPASMS 90 tablet 2  . diclofenac (VOLTAREN) 50 MG EC tablet TAKE 1 TABLET BY MOUTH THREE TIMES DAILY 270 tablet 2  . gabapentin (NEURONTIN) 600 MG tablet TAKE 1 TABLET BY MOUTH THREE TIMES DAILY 90 tablet 3  . lidocaine (LIDODERM) 5 % UNWRAP AND APPLY 1 PATCH TO SKIN DAILY. REMOVE AND DISCARD PATCH WITHIN 12 HOURS OR AS DIRECTED BY MD. (Patient taking differently: Place 1 patch onto the skin daily as needed (for pain). ) 30 patch 1  . lisinopril (PRINIVIL,ZESTRIL) 20 MG tablet Take 20 mg by mouth daily.   4  . nicotine (NICODERM CQ - DOSED IN MG/24 HOURS) 21 mg/24hr patch Place 21 mg onto the skin daily as needed (smoking cessation).     Marland Kitchen omeprazole (PRILOSEC) 20 MG capsule Take 20 mg by mouth daily.     . Tetrahydrozoline HCl (VISINE OP) Place 2 drops into both eyes daily as needed (for dry eyes).    . VENTOLIN HFA 108 (90 Base) MCG/ACT inhaler Inhale 1-2 puffs every 4 to 6 hours as needed for shortness of breath  1  . vitamin B-12 (CYANOCOBALAMIN) 500 MCG tablet Take 500 mcg by mouth daily.     No current facility-administered medications for this visit.     Allergies:   Duloxetine hcl and Carisoprodol    Social History:  The patient  reports that she has been smoking cigarettes. She has a 33.00 pack-year smoking history. She has never used smokeless tobacco. She reports that she drinks alcohol. She reports that she does not use drugs.   Family History:  The patient's family history includes Ovarian cancer in her mother.    ROS:  Please see the history of present illness.   Otherwise, review of systems are positive for none.   All other systems are reviewed and negative.    PHYSICAL EXAM: VS:  BP 130/70   Pulse 95   Ht 5' (1.524 m)   Wt 161 lb 9.6 oz (73.3 kg)   SpO2 99%   BMI 31.56 kg/m  , BMI Body mass index is 31.56 kg/m. GEN: Well nourished, well developed, in no acute  distress  HEENT: normal  Neck: no JVD, carotid bruits, or masses Cardiac: RRR; no murmurs, rubs, or gallops,no edema  Respiratory:  clear to auscultation bilaterally, normal work of breathing GI: soft, nontender, nondistended, + BS MS: no deformity or atrophy  Skin: warm and dry, no rash Neuro:  Strength and sensation are intact Psych: euthymic mood, full affect Vascular: Femoral pulses are +1 bilaterally.  Distal pulses are not palpable   EKG:  EKG is  Not ordered today.  Recent Labs: 03/25/2018: BUN 14; Creatinine, Ser 1.29; Hemoglobin 11.5; Platelets 229; Potassium 4.4; Sodium 138    Lipid Panel No results found for: CHOL, TRIG,  HDL, CHOLHDL, VLDL, LDLCALC, LDLDIRECT    Wt Readings from Last 3 Encounters:  08/19/18 161 lb 9.6 oz (73.3 kg)  07/15/18 158 lb (71.7 kg)  05/23/18 159 lb (72.1 kg)       No flowsheet data found.    ASSESSMENT AND PLAN:  1.  Peripheral arterial disease with severe right leg claudication and mild left leg claudication: No evidence of critical limb ischemia. I againreviewed the angiogram with her, given the long diffuse disease in the right SFA with occlusion in the mid to distal segment and the involvement of the ostium.  Continue medical therapy for now.  I elected to increase cilostazol to 100 mg twice daily. If she continues to be significantly limited by this, we can consider endovascular intervention as long as she understands risks and benefits of intervention on a chronically occluded SFA with decreased long-term patency.   2.  Plantar fasciitis:  Stable.   3.  Tobacco use: She smokes 1 cigarette in the morning and 1 in the evening.  I discussed with her the importance of quitting completely.  4.  Hyperlipidemia: Currently on atorvastatin 20 mg daily.   I do not have the results of her recent lipid profile.  Recommend a target LDL of less than 70.    Disposition:   FU with me in 3 months  Signed,  Kathlyn Sacramento, MD  08/19/2018  8:25 AM    [place]

## 2018-08-19 NOTE — Patient Instructions (Signed)
Medication Instructions:  Your physician has recommended you make the following change in your medication:  1) INCREASE Pletal to 100 mg tablet by mouth TWICE daily  If you need a refill on your cardiac medications before your next appointment, please call your pharmacy.   Lab work: none If you have labs (blood work) drawn today and your tests are completely normal, you will receive your results only by: Marland Kitchen MyChart Message (if you have MyChart) OR . A paper copy in the mail If you have any lab test that is abnormal or we need to change your treatment, we will call you to review the results.  Testing/Procedures: none  Follow-Up: At [facility], you and your health needs are our priority.  As part of our continuing mission to provide you with exceptional heart care, we have created designated Provider Care Teams.  These Care Teams include your primary Cardiologist (physician) and Advanced Practice Providers (APPs -  Physician Assistants and Nurse Practitioners) who all work together to provide you with the care you need, when you need it. You will need a follow up appointment in 3 months.  Please call our office 2 months in advance to schedule this appointment.  You may see Dr. Fletcher Anon or one of the following Advanced Practice Providers on your designated Care Team:   Kerin Ransom, PA-C Roby Lofts, [place] . Sande Rives, PA-C  Any Other Special Instructions Will Be Listed Below (If Applicable).

## 2018-08-21 ENCOUNTER — Ambulatory Visit (INDEPENDENT_AMBULATORY_CARE_PROVIDER_SITE_OTHER): Payer: Medicare Other | Admitting: Podiatry

## 2018-08-21 ENCOUNTER — Encounter: Payer: Self-pay | Admitting: Podiatry

## 2018-08-21 DIAGNOSIS — M722 Plantar fascial fibromatosis: Secondary | ICD-10-CM | POA: Diagnosis not present

## 2018-08-21 DIAGNOSIS — I999 Unspecified disorder of circulatory system: Secondary | ICD-10-CM | POA: Diagnosis not present

## 2018-08-21 MED ORDER — TRIAMCINOLONE ACETONIDE 10 MG/ML IJ SUSP
10.0000 mg | Freq: Once | INTRAMUSCULAR | Status: AC
  Administered 2018-08-21: 10 mg

## 2018-08-21 NOTE — Progress Notes (Signed)
Subjective:   Patient ID: Amy Bruce, female   DOB: 67 y.o.   MRN: [id number]   HPI Patient states her left arch is feeling quite a bit better with the injection on the right arch is still hurting and states that the fascial brace seem to really help her on the left and she does have diminished circulation right and will most likely have stenting performed   ROS      Objective:  Physical Exam  Neurovascular status intact with exquisite discomfort mid arch right with the left doing much better with minimal discomfort with diminished circulation right with vascular issues going     Assessment:  Plantar fasciitis right acute in nature with vascular pathology as part of the process most likely that is going on     Plan:  Reviewed the vascular component of her problem and we discussed the pros and cons of stenting I do think most likely will be of benefit to her and she will continue to work with her vascular doctor concerning this.  I did inject the mid arch right 3 mg Kenalog 5 mg Xylocaine and applied fascial brace to lift the arch patient will be seen back to reevaluate as needed and may require more aggressive treatment at one point future

## 2018-08-27 ENCOUNTER — Other Ambulatory Visit: Payer: Self-pay

## 2018-08-27 MED ORDER — CILOSTAZOL 100 MG PO TABS: 100.0000 mg | ORAL_TABLET | Freq: Two times a day (BID) | ORAL | 3 refills | Status: DC

## 2018-09-09 ENCOUNTER — Encounter (HOSPITAL_COMMUNITY): Payer: Self-pay | Admitting: *Deleted

## 2018-09-09 ENCOUNTER — Other Ambulatory Visit: Payer: Self-pay

## 2018-09-09 ENCOUNTER — Observation Stay (HOSPITAL_COMMUNITY)
Admission: EM | Admit: 2018-09-09 | Discharge: 2018-09-11 | Disposition: A | Discharge status: Home / Self Care | Payer: Medicare Other | Attending: Internal Medicine | Admitting: Internal Medicine

## 2018-09-09 ENCOUNTER — Emergency Department (HOSPITAL_COMMUNITY): Payer: Medicare Other

## 2018-09-09 DIAGNOSIS — R509 Fever, unspecified: Secondary | ICD-10-CM | POA: Diagnosis present

## 2018-09-09 DIAGNOSIS — Z79899 Other long term (current) drug therapy: Secondary | ICD-10-CM | POA: Diagnosis not present

## 2018-09-09 DIAGNOSIS — Z7982 Long term (current) use of aspirin: Secondary | ICD-10-CM | POA: Diagnosis not present

## 2018-09-09 DIAGNOSIS — E785 Hyperlipidemia, unspecified: Secondary | ICD-10-CM | POA: Insufficient documentation

## 2018-09-09 DIAGNOSIS — N12 Tubulo-interstitial nephritis, not specified as acute or chronic: Secondary | ICD-10-CM | POA: Diagnosis not present

## 2018-09-09 DIAGNOSIS — I10 Essential (primary) hypertension: Secondary | ICD-10-CM | POA: Diagnosis not present

## 2018-09-09 DIAGNOSIS — R319 Hematuria, unspecified: Secondary | ICD-10-CM | POA: Insufficient documentation

## 2018-09-09 DIAGNOSIS — R6510 Systemic inflammatory response syndrome (SIRS) of non-infectious origin without acute organ dysfunction: Secondary | ICD-10-CM

## 2018-09-09 DIAGNOSIS — F17210 Nicotine dependence, cigarettes, uncomplicated: Secondary | ICD-10-CM | POA: Diagnosis not present

## 2018-09-09 DIAGNOSIS — F419 Anxiety disorder, unspecified: Secondary | ICD-10-CM | POA: Insufficient documentation

## 2018-09-09 DIAGNOSIS — R7303 Prediabetes: Secondary | ICD-10-CM | POA: Insufficient documentation

## 2018-09-09 DIAGNOSIS — I739 Peripheral vascular disease, unspecified: Secondary | ICD-10-CM | POA: Insufficient documentation

## 2018-09-09 DIAGNOSIS — N390 Urinary tract infection, site not specified: Secondary | ICD-10-CM | POA: Insufficient documentation

## 2018-09-09 DIAGNOSIS — G894 Chronic pain syndrome: Secondary | ICD-10-CM | POA: Insufficient documentation

## 2018-09-09 DIAGNOSIS — R300 Dysuria: Secondary | ICD-10-CM | POA: Diagnosis present

## 2018-09-09 DIAGNOSIS — R109 Unspecified abdominal pain: Secondary | ICD-10-CM | POA: Diagnosis present

## 2018-09-09 LAB — PROTIME-INR
INR: 1.07
Prothrombin Time: 13.8 seconds (ref 11.4–15.2)

## 2018-09-09 LAB — URINALYSIS, ROUTINE W REFLEX MICROSCOPIC
Bilirubin Urine: NEGATIVE
Glucose, UA: NEGATIVE mg/dL
KETONES UR: NEGATIVE mg/dL
Nitrite: NEGATIVE
Protein, ur: 30 mg/dL — AB
Specific Gravity, Urine: 1.01 (ref 1.005–1.030)
WBC, UA: >50 WBC/hpf — ABNORMAL HIGH (ref 0–5)
pH: 6 (ref 5.0–8.0)

## 2018-09-09 LAB — CBC WITH DIFFERENTIAL/PLATELET
Abs Immature Granulocytes: 0.05 10*3/uL (ref 0.00–0.07)
Basophils Absolute: 0 10*3/uL (ref 0.0–0.1)
Basophils Relative: 0 %
EOS ABS: 0 10*3/uL (ref 0.0–0.5)
EOS PCT: 0 %
HCT: 37.8 % (ref 36.0–46.0)
Hemoglobin: 12.2 g/dL (ref 12.0–15.0)
Immature Granulocytes: 1 %
Lymphocytes Relative: 14 %
Lymphs Abs: 1.5 10*3/uL (ref 0.7–4.0)
MCH: 29.3 pg (ref 26.0–34.0)
MCHC: 32.3 g/dL (ref 30.0–36.0)
MCV: 90.6 fL (ref 80.0–100.0)
Monocytes Absolute: 0.8 10*3/uL (ref 0.1–1.0)
Monocytes Relative: 7 %
Neutro Abs: 8.6 10*3/uL — ABNORMAL HIGH (ref 1.7–7.7)
Neutrophils Relative %: 78 %
Platelets: 212 10*3/uL (ref 150–400)
RBC: 4.17 MIL/uL (ref 3.87–5.11)
RDW: 14.4 % (ref 11.5–15.5)
WBC: 11 10*3/uL — ABNORMAL HIGH (ref 4.0–10.5)
nRBC: 0 % (ref 0.0–0.2)

## 2018-09-09 LAB — COMPREHENSIVE METABOLIC PANEL
ALT: 22 U/L (ref 0–44)
AST: 19 U/L (ref 15–41)
Albumin: 3.8 g/dL (ref 3.5–5.0)
Alkaline Phosphatase: 63 U/L (ref 38–126)
Anion gap: 10 (ref 5–15)
BILIRUBIN TOTAL: 0.3 mg/dL (ref 0.3–1.2)
BUN: 14 mg/dL (ref 8–23)
CO2: 28 mmol/L (ref 22–32)
Calcium: 9.8 mg/dL (ref 8.9–10.3)
Chloride: 99 mmol/L (ref 98–111)
Creatinine, Ser: 1.03 mg/dL — ABNORMAL HIGH (ref 0.44–1.00)
GFR calc Af Amer: >60 mL/min (ref >60)
GFR, EST NON AFRICAN AMERICAN: 56 mL/min — AB (ref >60)
Glucose, Bld: 112 mg/dL — ABNORMAL HIGH (ref 70–99)
Potassium: 4.4 mmol/L (ref 3.5–5.1)
Sodium: 137 mmol/L (ref 135–145)
Total Protein: 7.9 g/dL (ref 6.5–8.1)

## 2018-09-09 LAB — I-STAT CG4 LACTIC ACID, ED: LACTIC ACID, VENOUS: 1.15 mmol/L (ref 0.5–1.9)

## 2018-09-09 MED ORDER — ACETAMINOPHEN 325 MG PO TABS
650.0000 mg | ORAL_TABLET | Freq: Once | ORAL | Status: AC | PRN
  Administered 2018-09-09: 650 mg via ORAL
  Filled 2018-09-09: qty 2

## 2018-09-09 NOTE — ED Triage Notes (Signed)
Pt is here with cloudy urine with blood specs in it.Complaining of right flank pain and radiates around to lower abdomen.  Pt reports nausea.  EMS reported temp.   BP 160/70, HR 120

## 2018-09-10 ENCOUNTER — Other Ambulatory Visit: Payer: Self-pay

## 2018-09-10 ENCOUNTER — Observation Stay (HOSPITAL_COMMUNITY): Payer: Medicare Other

## 2018-09-10 DIAGNOSIS — I10 Essential (primary) hypertension: Secondary | ICD-10-CM

## 2018-09-10 DIAGNOSIS — N12 Tubulo-interstitial nephritis, not specified as acute or chronic: Secondary | ICD-10-CM | POA: Diagnosis not present

## 2018-09-10 DIAGNOSIS — G894 Chronic pain syndrome: Secondary | ICD-10-CM

## 2018-09-10 LAB — CBC
HCT: 36.1 % (ref 36.0–46.0)
Hemoglobin: 11.9 g/dL — ABNORMAL LOW (ref 12.0–15.0)
MCH: 29.8 pg (ref 26.0–34.0)
MCHC: 33 g/dL (ref 30.0–36.0)
MCV: 90.5 fL (ref 80.0–100.0)
Platelets: 203 10*3/uL (ref 150–400)
RBC: 3.99 MIL/uL (ref 3.87–5.11)
RDW: 14.4 % (ref 11.5–15.5)
WBC: 10.6 10*3/uL — AB (ref 4.0–10.5)
nRBC: 0 % (ref 0.0–0.2)

## 2018-09-10 LAB — BASIC METABOLIC PANEL
Anion gap: 11 (ref 5–15)
BUN: 14 mg/dL (ref 8–23)
CO2: 26 mmol/L (ref 22–32)
CREATININE: 1.16 mg/dL — AB (ref 0.44–1.00)
Calcium: 9.4 mg/dL (ref 8.9–10.3)
Chloride: 99 mmol/L (ref 98–111)
GFR calc Af Amer: 56 mL/min — ABNORMAL LOW (ref >60)
GFR, EST NON AFRICAN AMERICAN: 48 mL/min — AB (ref >60)
Glucose, Bld: 108 mg/dL — ABNORMAL HIGH (ref 70–99)
Potassium: 4.1 mmol/L (ref 3.5–5.1)
Sodium: 136 mmol/L (ref 135–145)

## 2018-09-10 LAB — HIV ANTIBODY (ROUTINE TESTING W REFLEX): HIV SCREEN 4TH GENERATION: NONREACTIVE

## 2018-09-10 MED ORDER — ACETAMINOPHEN 650 MG RE SUPP: 650.0000 mg | Freq: Four times a day (QID) | RECTAL | Status: DC | PRN

## 2018-09-10 MED ORDER — ACETAMINOPHEN 325 MG PO TABS
650.0000 mg | ORAL_TABLET | Freq: Four times a day (QID) | ORAL | Status: DC | PRN
  Administered 2018-09-10 – 2018-09-11 (×2): 650 mg via ORAL
  Filled 2018-09-10 (×2): qty 2

## 2018-09-10 MED ORDER — ONDANSETRON HCL 4 MG/2ML IJ SOLN
4.0000 mg | Freq: Four times a day (QID) | INTRAMUSCULAR | Status: DC | PRN
  Administered 2018-09-10: 4 mg via INTRAVENOUS

## 2018-09-10 MED ORDER — ATORVASTATIN CALCIUM 10 MG PO TABS
20.0000 mg | ORAL_TABLET | Freq: Every day | ORAL | Status: DC
  Administered 2018-09-10: 20 mg via ORAL
  Filled 2018-09-10: qty 2

## 2018-09-10 MED ORDER — SODIUM CHLORIDE 0.9 % IV BOLUS
1000.0000 mL | Freq: Once | INTRAVENOUS | Status: AC
  Administered 2018-09-10: 1000 mL via INTRAVENOUS

## 2018-09-10 MED ORDER — ENOXAPARIN SODIUM 40 MG/0.4ML ~~LOC~~ SOLN
40.0000 mg | SUBCUTANEOUS | Status: DC
  Administered 2018-09-10: 40 mg via SUBCUTANEOUS
  Filled 2018-09-10 (×2): qty 0.4

## 2018-09-10 MED ORDER — CYANOCOBALAMIN 500 MCG PO TABS
500.0000 ug | ORAL_TABLET | Freq: Every day | ORAL | Status: DC
  Administered 2018-09-10 – 2018-09-11 (×2): 500 ug via ORAL
  Filled 2018-09-10 (×2): qty 1

## 2018-09-10 MED ORDER — CYCLOBENZAPRINE HCL 10 MG PO TABS
10.0000 mg | ORAL_TABLET | Freq: Three times a day (TID) | ORAL | Status: DC
  Administered 2018-09-10 – 2018-09-11 (×5): 10 mg via ORAL
  Filled 2018-09-10 (×5): qty 1

## 2018-09-10 MED ORDER — CALCIUM CARBONATE ANTACID 500 MG PO CHEW
2.0000 | CHEWABLE_TABLET | Freq: Every day | ORAL | Status: DC | PRN
  Filled 2018-09-10: qty 2

## 2018-09-10 MED ORDER — SODIUM CHLORIDE 0.9 % IV SOLN
2.0000 g | Freq: Once | INTRAVENOUS | Status: AC
  Administered 2018-09-10: 2 g via INTRAVENOUS
  Filled 2018-09-10: qty 20

## 2018-09-10 MED ORDER — ALBUTEROL SULFATE (2.5 MG/3ML) 0.083% IN NEBU: 3.0000 mL | INHALATION_SOLUTION | RESPIRATORY_TRACT | Status: DC | PRN

## 2018-09-10 MED ORDER — GABAPENTIN 300 MG PO CAPS
600.0000 mg | ORAL_CAPSULE | Freq: Three times a day (TID) | ORAL | Status: DC
  Administered 2018-09-10 – 2018-09-11 (×5): 600 mg via ORAL
  Filled 2018-09-10 (×5): qty 2

## 2018-09-10 MED ORDER — LISINOPRIL 20 MG PO TABS: 20.0000 mg | ORAL_TABLET | Freq: Every day | ORAL | Status: DC

## 2018-09-10 MED ORDER — FENTANYL CITRATE (PF) 100 MCG/2ML IJ SOLN
50.0000 ug | INTRAMUSCULAR | Status: DC | PRN
  Administered 2018-09-10: 50 ug via INTRAVENOUS
  Filled 2018-09-10: qty 2

## 2018-09-10 MED ORDER — DICLOFENAC SODIUM 25 MG PO TBEC
50.0000 mg | DELAYED_RELEASE_TABLET | Freq: Three times a day (TID) | ORAL | Status: DC
  Administered 2018-09-10 – 2018-09-11 (×5): 50 mg via ORAL
  Filled 2018-09-10 (×2): qty 1
  Filled 2018-09-10 (×7): qty 2

## 2018-09-10 MED ORDER — FENTANYL CITRATE (PF) 100 MCG/2ML IJ SOLN
50.0000 ug | Freq: Once | INTRAMUSCULAR | Status: AC
  Administered 2018-09-10: 50 ug via INTRAVENOUS
  Filled 2018-09-10: qty 2

## 2018-09-10 MED ORDER — CILOSTAZOL 100 MG PO TABS
100.0000 mg | ORAL_TABLET | Freq: Two times a day (BID) | ORAL | Status: DC
  Administered 2018-09-10 – 2018-09-11 (×3): 100 mg via ORAL
  Filled 2018-09-10 (×4): qty 1

## 2018-09-10 MED ORDER — ACETAMINOPHEN 325 MG PO TABS
650.0000 mg | ORAL_TABLET | Freq: Once | ORAL | Status: AC | PRN
  Administered 2018-09-10: 650 mg via ORAL
  Filled 2018-09-10: qty 2

## 2018-09-10 MED ORDER — SODIUM CHLORIDE 0.9 % IV SOLN
1.0000 g | INTRAVENOUS | Status: DC
  Administered 2018-09-11: 1 g via INTRAVENOUS
  Filled 2018-09-10: qty 10

## 2018-09-10 MED ORDER — CILOSTAZOL 100 MG PO TABS: 100.0000 mg | ORAL_TABLET | Freq: Two times a day (BID) | ORAL | Status: DC

## 2018-09-10 MED ORDER — PANTOPRAZOLE SODIUM 40 MG PO TBEC
40.0000 mg | DELAYED_RELEASE_TABLET | Freq: Every day | ORAL | Status: DC
  Administered 2018-09-10 – 2018-09-11 (×2): 40 mg via ORAL
  Filled 2018-09-10 (×2): qty 1

## 2018-09-10 MED ORDER — SODIUM CHLORIDE 0.9 % IV SOLN
INTRAVENOUS | Status: DC
  Administered 2018-09-10 – 2018-09-11 (×3): via INTRAVENOUS

## 2018-09-10 MED ORDER — ONDANSETRON HCL 4 MG/2ML IJ SOLN
4.0000 mg | Freq: Once | INTRAMUSCULAR | Status: AC
  Administered 2018-09-10: 4 mg via INTRAVENOUS
  Filled 2018-09-10: qty 2

## 2018-09-10 MED ORDER — ONDANSETRON HCL 4 MG PO TABS: 4.0000 mg | ORAL_TABLET | Freq: Four times a day (QID) | ORAL | Status: DC | PRN

## 2018-09-10 MED ORDER — ASPIRIN EC 81 MG PO TBEC
81.0000 mg | DELAYED_RELEASE_TABLET | Freq: Every day | ORAL | Status: DC
  Administered 2018-09-10 – 2018-09-11 (×2): 81 mg via ORAL
  Filled 2018-09-10 (×2): qty 1

## 2018-09-10 NOTE — ED Notes (Signed)
Ordered breakfast tray, diet heart healthy  

## 2018-09-10 NOTE — H&P (Signed)
History and Physical    Amy Bruce HFW:[id number] DOB: 12/01/1950 DOA: 09/09/2018  PCP: London Pepper, MD  Patient coming from: Home  I have personally briefly reviewed patient's old medical records in [place]  Chief Complaint: Fever, Flank pain  HPI: Amy Bruce is a 68 y.o. female with medical history significant of HTN, HLD.  Patient presents to the ED with c/o fever, flank pain, dysuria, foul smelling urine.  Onset Monday night / tues morning.  Worsening since that time.  Pain radiates to groin.  Nausea but no vomiting.  No cough nor URI symptoms.   ED Course: UA confirms UTI.  Tm 103.x.  Tachy to 130, improved to 110 after IVF.   Review of Systems: As per HPI otherwise 10 point review of systems negative.   Past Medical History:  Diagnosis Date  . Anemia    in the past  . Anxiety   . Arthritis   . GERD (gastroesophageal reflux disease)   . Heart murmur    due to rheumatic fever, PCP no longer hears it  . Hyperlipidemia   . Hypertension   . Myalgia   . Neuropathy   . PAD (peripheral artery disease) ([place])   . Pre-diabetes   . Tobacco abuse     Past Surgical History:  Procedure Laterality Date  . ABDOMINAL AORTOGRAM W/LOWER EXTREMITY N/A 04/02/2018   Procedure: ABDOMINAL AORTOGRAM W/LOWER EXTREMITY;  Surgeon: Wellington Hampshire, MD;  Location: [facility];  Service: Cardiovascular;  Laterality: N/A;  . ABDOMINAL HYSTERECTOMY  1999  . [place]   C5/6 6/7  . COLONOSCOPY    . Left axillary cyst removal 1989    . MULTIPLE EXTRACTIONS WITH ALVEOLOPLASTY N/A 10/25/2017   Procedure: EXTRACTION NUMBERS FIVE, SEVEN, EIGHT, NINE, TEN, FIFTEEN, EIGHTEEN WITH ALVEOLOPLASTY, IRRIGATION AND DEBRIDEMENT LEFT SUBMANDIBULAR INFECTION;  Surgeon: Diona Browner, DDS;  Location: [facility];  Service: Oral Surgery;  Laterality: N/A;  . TONSILECTOMY, ADENOIDECTOMY, BILATERAL MYRINGOTOMY AND TUBES  1981  . TUBAL LIGATION  1981     reports that she  has been smoking cigarettes. She has a 33.00 pack-year smoking history. She has never used smokeless tobacco. She reports current alcohol use. She reports that she does not use drugs.  Allergies  Allergen Reactions  . Duloxetine Hcl Swelling, Other (See Comments) and Hypertension    OTHER REACTIONS UNSPECIFIED   . Carisoprodol Swelling and Other (See Comments)    SWELLING REACTION UNSPECIFIED   . Sulfa Antibiotics     Family History  Problem Relation Age of Onset  . Ovarian cancer Mother      Prior to Admission medications   Medication Sig Start Date End Date Taking? Authorizing Provider  aspirin EC 81 MG tablet Take 81 mg by mouth daily.   Yes [provider]  atorvastatin (LIPITOR) 20 MG tablet Take 20 mg by mouth daily at 6 PM.   Yes [provider]  calcium carbonate (TUMS - DOSED IN MG ELEMENTAL CALCIUM) 500 MG chewable tablet Chew 2 tablets by mouth daily as needed for indigestion or heartburn.    Yes [provider]  cilostazol (PLETAL) 100 MG tablet Take 1 tablet (100 mg total) by mouth 2 (two) times daily. 08/27/18  Yes Arida, Muammar A, MD  cyclobenzaprine (FLEXERIL) 10 MG tablet TAKE 1 TABLET BY MOUTH THREE TIMES DAILY AS NEEDED FOR MUSCLE SPASMS Patient taking differently: Take 10 mg by mouth 3 (three) times daily.  07/22/18  Yes Jamse Arn, MD  diclofenac (VOLTAREN) 50 MG EC tablet TAKE 1 TABLET BY MOUTH THREE TIMES DAILY Patient taking differently: Take 50 mg by mouth 3 (three) times daily.  12/23/17  Yes Patel, Domenick Bookbinder, MD  gabapentin (NEURONTIN) 600 MG tablet TAKE 1 TABLET BY MOUTH THREE TIMES DAILY Patient taking differently: Take 600 mg by mouth 3 (three) times daily.  06/23/18  Yes Kirsteins, Luanna Salk, MD  lidocaine (LIDODERM) 5 % UNWRAP AND APPLY 1 PATCH TO SKIN DAILY. REMOVE AND DISCARD PATCH WITHIN 12 HOURS OR AS DIRECTED BY MD. Patient taking differently: Place 1 patch onto the skin daily as needed (for pain).  01/23/18  Yes  Jamse Arn, MD  lisinopril (PRINIVIL,ZESTRIL) 20 MG tablet Take 20 mg by mouth daily.  10/02/17  Yes [provider]  omeprazole (PRILOSEC) 20 MG capsule Take 20 mg by mouth daily.    Yes [provider]  VENTOLIN HFA 108 (90 Base) MCG/ACT inhaler Inhale 1-2 puffs into the lungs every 4 (four) hours as needed for wheezing.  11/30/16  Yes [provider]  vitamin B-12 (CYANOCOBALAMIN) 500 MCG tablet Take 500 mcg by mouth daily.   Yes [provider]    Physical Exam: Vitals:   09/10/18 0241 09/10/18 0330 09/10/18 0345 09/10/18 0400  BP: 125/71 128/82 117/72 120/75  Pulse: (!) 114 (!) 106 (!) 108 (!) 110  Resp: 16 16 19  (!) 22  Temp: 100.2 F (37.9 C)     TempSrc: Oral     SpO2: 96% 98% 95% 97%    Constitutional: NAD, calm, comfortable Eyes: PERRL, lids and conjunctivae normal ENMT: Mucous membranes are moist. Posterior pharynx clear of any exudate or lesions.Normal dentition.  Neck: normal, supple, no masses, no thyromegaly Respiratory: clear to auscultation bilaterally, no wheezing, no crackles. Normal respiratory effort. No accessory muscle use.  Cardiovascular: Regular rate and rhythm, no murmurs / rubs / gallops. No extremity edema. 2+ pedal pulses. No carotid bruits.  Abdomen: no tenderness, no masses palpated. No hepatosplenomegaly. Bowel sounds positive.  Musculoskeletal: no clubbing / cyanosis. No joint deformity upper and lower extremities. Good ROM, no contractures. Normal muscle tone.  Skin: no rashes, lesions, ulcers. No induration Neurologic: CN 2-12 grossly intact. Sensation intact, DTR normal. Strength 5/5 in all 4.  Psychiatric: Normal judgment and insight. Alert and oriented x 3. Normal mood.    Labs on Admission: I have personally reviewed following labs and imaging studies  CBC: Recent Labs  Lab 09/09/18 1813 09/10/18 0348  WBC 11.0* 10.6*  NEUTROABS 8.6*  --   HGB 12.2 11.9*  HCT 37.8 36.1  MCV 90.6 90.5  PLT 212  846   Basic Metabolic Panel: Recent Labs  Lab 09/09/18 1813  NA 137  K 4.4  CL 99  CO2 28  GLUCOSE 112*  BUN 14  CREATININE 1.03*  CALCIUM 9.8   GFR: CrCl cannot be calculated (Unknown ideal weight.). Liver Function Tests: Recent Labs  Lab 09/09/18 1813  AST 19  ALT 22  ALKPHOS 63  BILITOT 0.3  PROT 7.9  ALBUMIN 3.8   No results for input(s): LIPASE, AMYLASE in the last 168 hours. No results for input(s): AMMONIA in the last 168 hours. Coagulation Profile: Recent Labs  Lab 09/09/18 1813  INR 1.07   Cardiac Enzymes: No results for input(s): CKTOTAL, CKMB, CKMBINDEX, TROPONINI in the last 168 hours. BNP (last 3 results) No results for input(s): PROBNP in the last 8760 hours. HbA1C: No results for  input(s): HGBA1C in the last 72 hours. CBG: No results for input(s): GLUCAP in the last 168 hours. Lipid Profile: No results for input(s): CHOL, HDL, LDLCALC, TRIG, CHOLHDL, LDLDIRECT in the last 72 hours. Thyroid Function Tests: No results for input(s): TSH, T4TOTAL, FREET4, T3FREE, THYROIDAB in the last 72 hours. Anemia Panel: No results for input(s): VITAMINB12, FOLATE, FERRITIN, TIBC, IRON, RETICCTPCT in the last 72 hours. Urine analysis:    Component Value Date/Time   COLORURINE YELLOW 09/09/2018 2115   APPEARANCEUR CLOUDY (A) 09/09/2018 2115   LABSPEC 1.010 09/09/2018 2115   PHURINE 6.0 09/09/2018 2115   GLUCOSEU NEGATIVE 09/09/2018 2115   HGBUR MODERATE (A) 09/09/2018 2115   BILIRUBINUR NEGATIVE 09/09/2018 2115   [place] NEGATIVE 09/09/2018 2115   PROTEINUR 30 (A) 09/09/2018 2115   NITRITE NEGATIVE 09/09/2018 2115   LEUKOCYTESUR LARGE (A) 09/09/2018 2115    Radiological Exams on Admission: Dg Chest 2 View  Result Date: 09/09/2018 CLINICAL DATA:  Fever EXAM: CHEST - 2 VIEW COMPARISON:  CT 03/24/2018 FINDINGS: No focal opacity or pleural effusion. Normal heart size. Aortic atherosclerosis. No pneumothorax. IMPRESSION: No active cardiopulmonary  disease. Electronically Signed   By: Donavan Foil M.D.   On: 09/09/2018 18:57    EKG: Independently reviewed.  Assessment/Plan Principal Problem:   Pyelonephritis Active Problems:   Chronic pain syndrome   Hypertension    1. Pyelonephritis - 1. Rocephin 2. Culture pending: urine and blood ordered 3. IVF: 1L bolus and 125 cc/hr 4. Tele monitor for tachycardia for the moment 5. US renal being done now 2. Chronic pain syndrome - continue flexeril, neurontin, voltaren 3. HTN - holding lisinopril given fever / tachycardia.  DVT prophylaxis: Lovenox Code Status: Full Family Communication: No family in room Disposition Plan: Home after admit Consults called: None Admission status: Place in obs     , [facility] Pager [phone] Only works nights!  If 7AM-7PM, please contact the primary day team physician taking care of patient  [email or web address] Password [facility]  09/10/2018, 4:28 AM

## 2018-09-10 NOTE — Care Management Obs Status (Signed)
[facility] NOTIFICATION   Patient Details  Name: IVON ROEDEL MRN: [id number] Date of Birth: Jul 06, 1951   Medicare Observation Status Notification Given:  Yes, Notice explained to patient.     Apolonio Schneiders, RN 09/10/2018, 5:41 PM

## 2018-09-10 NOTE — Progress Notes (Signed)
Patient admitted after midnight, please see H&P.  Here with UTI and prob pyelonephritis.  Await urine culture.  Continue IV abx and monitor for improvement.  Still tachy into the 110s at rest.  Continue IV fluids and re-evaluate.  Suspect at least 24 more hours in hospital.   Blood cultures pending to r/o gram neg bacteremia  Eulogio Bear DO

## 2018-09-10 NOTE — Care Management Note (Signed)
Case Management Note  Patient Details  Name: Amy Bruce MRN: [id number] Date of Birth: 03-31-1951  Subjective/Objective:                  Pyelonephritis  Action/Plan: ED CM spoke with the patient at the bedside. Patient reports she lives at home alone. States she does not require any assistance or DME.   Expected Discharge Date:  09/11/18               Expected Discharge Plan:  Home/Self Care  In-House Referral:     Discharge planning Services     Post Acute Care Choice:    Choice offered to:     DME Arranged:    DME Agency:     HH Arranged:    HH Agency:     Status of Service:  In process, will continue to follow  If discussed at Long Length of Stay Meetings, dates discussed:    Additional Comments:  Apolonio Schneiders, RN 09/10/2018, 5:49 PM

## 2018-09-10 NOTE — ED Notes (Signed)
ED Provider at bedside. 

## 2018-09-10 NOTE — ED Notes (Signed)
Patient transported to Ultrasound 

## 2018-09-10 NOTE — ED Provider Notes (Signed)
[facility] EMERGENCY DEPARTMENT Provider Note   CSN: [id number] Arrival date & time: 09/09/18  1741     History   Chief Complaint Chief Complaint  Patient presents with  . Fever  . Flank Pain  . Abdominal Pain    HPI Amy Bruce is a 68 y.o. female.  The history is provided by the patient and medical records.     68 year old female with history of anemia, anxiety, arthritis, GERD, hyperlipidemia, hypertension, neuropathy, peripheral arterial disease, presenting to the ED with fever and flank pain.  States this began Monday night/Tuesday morning and has been worsening since that time.  She reports dull, aching pain in both of her flanks, equal on both sides.  States pain seems to radiate to her groin on both sides as well.  She does report urinary frequency, dysuria, and foul-smelling urine.  Has been having fever and chills at home as well.  She does report some nausea but denies vomiting.  She has not had any cough or upper respiratory symptoms.  Past Medical History:  Diagnosis Date  . Anemia    in the past  . Anxiety   . Arthritis   . GERD (gastroesophageal reflux disease)   . Heart murmur    due to rheumatic fever, PCP no longer hears it  . Hyperlipidemia   . Hypertension   . Myalgia   . Neuropathy   . PAD (peripheral artery disease) ([place])   . Pre-diabetes   . Tobacco abuse     Patient Active Problem List   Diagnosis Date Noted  . PAD (peripheral artery disease) ([place])   . Hypertension   . Chronic pain syndrome 10/11/2016  . Right shoulder pain 10/11/2016  . Neck pain, chronic 10/11/2016  . Numbness and tingling in right hand 10/11/2016  . Myalgia 10/11/2016    Past Surgical History:  Procedure Laterality Date  . ABDOMINAL AORTOGRAM W/LOWER EXTREMITY N/A 04/02/2018   Procedure: ABDOMINAL AORTOGRAM W/LOWER EXTREMITY;  Surgeon: Wellington Hampshire, MD;  Location: [facility];  Service: Cardiovascular;  Laterality: N/A;  .  ABDOMINAL HYSTERECTOMY  1999  . [place]   C5/6 6/7  . COLONOSCOPY    . Left axillary cyst removal 1989    . MULTIPLE EXTRACTIONS WITH ALVEOLOPLASTY N/A 10/25/2017   Procedure: EXTRACTION NUMBERS FIVE, SEVEN, EIGHT, NINE, TEN, FIFTEEN, EIGHTEEN WITH ALVEOLOPLASTY, IRRIGATION AND DEBRIDEMENT LEFT SUBMANDIBULAR INFECTION;  Surgeon: Diona Browner, DDS;  Location: [facility];  Service: Oral Surgery;  Laterality: N/A;  . TONSILECTOMY, ADENOIDECTOMY, BILATERAL MYRINGOTOMY AND TUBES  1981  . TUBAL LIGATION  1981     OB History   No obstetric history on file.      Home Medications    Prior to Admission medications   Medication Sig Start Date End Date Taking? Authorizing Provider  aspirin EC 81 MG tablet Take 81 mg by mouth daily.    [provider]  atorvastatin (LIPITOR) 20 MG tablet Take 20 mg by mouth daily at 6 PM.    [provider]  calcium carbonate (TUMS - DOSED IN MG ELEMENTAL CALCIUM) 500 MG chewable tablet Chew 2 tablets by mouth daily as needed for indigestion or heartburn.     [provider]  cilostazol (PLETAL) 100 MG tablet Take 1 tablet (100 mg total) by mouth 2 (two) times daily. 08/27/18   Arida, Muammar A, MD  cyclobenzaprine (FLEXERIL) 10 MG tablet TAKE 1 TABLET BY MOUTH THREE TIMES DAILY AS  NEEDED FOR MUSCLE SPASMS 07/22/18   Jamse Arn, MD  diclofenac (VOLTAREN) 50 MG EC tablet TAKE 1 TABLET BY MOUTH THREE TIMES DAILY 12/23/17   Jamse Arn, MD  gabapentin (NEURONTIN) 600 MG tablet TAKE 1 TABLET BY MOUTH THREE TIMES DAILY 06/23/18   Kirsteins, Luanna Salk, MD  lidocaine (LIDODERM) 5 % UNWRAP AND APPLY 1 PATCH TO SKIN DAILY. REMOVE AND DISCARD PATCH WITHIN 12 HOURS OR AS DIRECTED BY MD. Patient taking differently: Place 1 patch onto the skin daily as needed (for pain).  01/23/18   Jamse Arn, MD  lisinopril (PRINIVIL,ZESTRIL) 20 MG tablet Take 20 mg by mouth daily.  10/02/17   [provider]  nicotine  (NICODERM CQ - DOSED IN MG/24 HOURS) 21 mg/24hr patch Place 21 mg onto the skin daily as needed (smoking cessation).     [provider]  omeprazole (PRILOSEC) 20 MG capsule Take 20 mg by mouth daily.     [provider]  Tetrahydrozoline HCl (VISINE OP) Place 2 drops into both eyes daily as needed (for dry eyes).    [provider]  VENTOLIN HFA 108 (90 Base) MCG/ACT inhaler Inhale 1-2 puffs every 4 to 6 hours as needed for shortness of breath 11/30/16   [provider]  vitamin B-12 (CYANOCOBALAMIN) 500 MCG tablet Take 500 mcg by mouth daily.    [provider]    Family History Family History  Problem Relation Age of Onset  . Ovarian cancer Mother     Social History Social History   Tobacco Use  . Smoking status: Current Every Day Smoker    Packs/day: 1.00    Years: 33.00    Pack years: 33.00    Types: Cigarettes  . Smokeless tobacco: Never Used  . Tobacco comment: 2 cigarettes per day  Substance Use Topics  . Alcohol use: Yes    Comment: 1-2 wk  . Drug use: No     Allergies   Duloxetine hcl; Carisoprodol; and Sulfa antibiotics   Review of Systems Review of Systems  Constitutional: Positive for fever.  Gastrointestinal: Positive for nausea.  Genitourinary: Positive for flank pain.  All other systems reviewed and are negative.    Physical Exam Updated Vital Signs BP 125/71 (BP Location: Right Arm)   Pulse (!) 114   Temp 100.2 F (37.9 C) (Oral)   Resp 16   SpO2 96%   Physical Exam Vitals signs and nursing note reviewed.  Constitutional:      Appearance: She is well-developed.     Comments: Warm to the touch  HENT:     Head: Normocephalic and atraumatic.  Eyes:     Conjunctiva/sclera: Conjunctivae normal.     Pupils: Pupils are equal, round, and reactive to light.  Neck:     Musculoskeletal: Normal range of motion.  Cardiovascular:     Rate and Rhythm: Regular rhythm. Tachycardia present.     Heart sounds:  Normal heart sounds.  Pulmonary:     Effort: Pulmonary effort is normal.     Breath sounds: Normal breath sounds.  Abdominal:     General: Bowel sounds are normal.     Palpations: Abdomen is soft.     Tenderness: There is no abdominal tenderness. There is right CVA tenderness and left CVA tenderness.  Musculoskeletal: Normal range of motion.  Skin:    General: Skin is warm and dry.  Neurological:     Mental Status: She is alert and oriented to person, place,  and time.      ED Treatments / Results  Labs (all labs ordered are listed, but only abnormal results are displayed) Labs Reviewed  COMPREHENSIVE METABOLIC PANEL - Abnormal; Notable for the following components:      Result Value   Glucose, Bld 112 (*)    Creatinine, Ser 1.03 (*)    GFR calc non Af Amer 56 (*)    All other components within normal limits  CBC WITH DIFFERENTIAL/PLATELET - Abnormal; Notable for the following components:   WBC 11.0 (*)    Neutro Abs 8.6 (*)    All other components within normal limits  URINALYSIS, ROUTINE W REFLEX MICROSCOPIC - Abnormal; Notable for the following components:   APPearance CLOUDY (*)    Hgb urine dipstick MODERATE (*)    Protein, ur 30 (*)    Leukocytes, UA LARGE (*)    WBC, UA >50 (*)    Bacteria, UA MANY (*)    All other components within normal limits  CULTURE, BLOOD (ROUTINE X 2)  CULTURE, BLOOD (ROUTINE X 2)  URINE CULTURE  PROTIME-INR  HIV ANTIBODY (ROUTINE TESTING W REFLEX)  CBC  BASIC METABOLIC PANEL  I-STAT CG4 LACTIC ACID, ED    EKG None  Radiology Dg Chest 2 View  Result Date: 09/09/2018 CLINICAL DATA:  Fever EXAM: CHEST - 2 VIEW COMPARISON:  CT 03/24/2018 FINDINGS: No focal opacity or pleural effusion. Normal heart size. Aortic atherosclerosis. No pneumothorax. IMPRESSION: No active cardiopulmonary disease. Electronically Signed   By: Donavan Foil M.D.   On: 09/09/2018 18:57    Procedures Procedures (including critical care time)  Medications  Ordered in ED Medications  sodium chloride 0.9 % bolus 1,000 mL (1,000 mLs Intravenous New Bag/Given 09/10/18 0329)  cefTRIAXone (ROCEPHIN) 2 g in sodium chloride 0.9 % 100 mL IVPB (2 g Intravenous New Bag/Given 09/10/18 0331)  cefTRIAXone (ROCEPHIN) 1 g in sodium chloride 0.9 % 100 mL IVPB (has no administration in time range)  aspirin EC tablet 81 mg (has no administration in time range)  atorvastatin (LIPITOR) tablet 20 mg (has no administration in time range)  calcium carbonate (TUMS - dosed in mg elemental calcium) chewable tablet 400 mg of elemental calcium (has no administration in time range)  gabapentin (NEURONTIN) capsule 600 mg (has no administration in time range)  albuterol (PROVENTIL) (2.5 MG/3ML) 0.083% nebulizer solution 3 mL (has no administration in time range)  vitamin B-12 (CYANOCOBALAMIN) tablet 500 mcg (has no administration in time range)  pantoprazole (PROTONIX) EC tablet 40 mg (has no administration in time range)  cyclobenzaprine (FLEXERIL) tablet 10 mg (has no administration in time range)  diclofenac (VOLTAREN) EC tablet 50 mg (has no administration in time range)  cilostazol (PLETAL) tablet 100 mg (has no administration in time range)  0.9 %  sodium chloride infusion (has no administration in time range)  acetaminophen (TYLENOL) tablet 650 mg (has no administration in time range)    Or  acetaminophen (TYLENOL) suppository 650 mg (has no administration in time range)  ondansetron (ZOFRAN) tablet 4 mg (has no administration in time range)    Or  ondansetron (ZOFRAN) injection 4 mg (has no administration in time range)  enoxaparin (LOVENOX) injection 40 mg (has no administration in time range)  acetaminophen (TYLENOL) tablet 650 mg (650 mg Oral Given 09/09/18 1818)  acetaminophen (TYLENOL) tablet 650 mg (650 mg Oral Given 09/10/18 0122)  ondansetron (ZOFRAN) injection 4 mg (4 mg Intravenous Given 09/10/18 0316)  fentaNYL (SUBLIMAZE) injection 50 mcg (50  mcg Intravenous  Given 09/10/18 0315)     Initial Impression / Assessment and Plan / ED Course  I have reviewed the triage vital signs and the nursing notes.  Pertinent labs & imaging results that were available during my care of the patient were reviewed by me and considered in my medical decision making (see chart for details).  68 year old female here with fever and flank pain for the past 48 hours.  She is febrile here but overall nontoxic in appearance.  She does have bilateral CVA tenderness but remainder of abdomen is soft and benign.  Labs overall reassuring.  UA does appear infectious with many bacteria, > 50 WBCs, large leuks.  Patient meets SIRS criteria.  Blood and urine cultures have been sent.  Patient's fever has improved here after Tylenol, but she remains tachycardic and warm to the touch.  Will start IV fluids, given 2 g Rocephin as well as fentanyl and Zofran for symptomatic control.  She will require admission.  Discussed with Dr. Alcario Drought-- will admit for ongoing care.  Final Clinical Impressions(s) / ED Diagnoses   Final diagnoses:  Pyelonephritis  SIRS (systemic inflammatory response syndrome) [facility])    ED Discharge Orders    None       Larene Pickett, PA-C 09/10/18 [place], Delice Bison, DO 09/10/18 [phone]

## 2018-09-11 ENCOUNTER — Observation Stay (HOSPITAL_COMMUNITY): Payer: Medicare Other

## 2018-09-11 DIAGNOSIS — N12 Tubulo-interstitial nephritis, not specified as acute or chronic: Secondary | ICD-10-CM | POA: Diagnosis not present

## 2018-09-11 DIAGNOSIS — G894 Chronic pain syndrome: Secondary | ICD-10-CM | POA: Diagnosis not present

## 2018-09-11 LAB — CBC
HCT: 31.1 % — ABNORMAL LOW (ref 36.0–46.0)
Hemoglobin: 9.9 g/dL — ABNORMAL LOW (ref 12.0–15.0)
MCH: 28.7 pg (ref 26.0–34.0)
MCHC: 31.8 g/dL (ref 30.0–36.0)
MCV: 90.1 fL (ref 80.0–100.0)
NRBC: 0 % (ref 0.0–0.2)
Platelets: 165 10*3/uL (ref 150–400)
RBC: 3.45 MIL/uL — ABNORMAL LOW (ref 3.87–5.11)
RDW: 14.2 % (ref 11.5–15.5)
WBC: 7.1 10*3/uL (ref 4.0–10.5)

## 2018-09-11 LAB — BASIC METABOLIC PANEL
Anion gap: 8 (ref 5–15)
BUN: 19 mg/dL (ref 8–23)
CO2: 23 mmol/L (ref 22–32)
Calcium: 8.3 mg/dL — ABNORMAL LOW (ref 8.9–10.3)
Chloride: 107 mmol/L (ref 98–111)
Creatinine, Ser: 1.15 mg/dL — ABNORMAL HIGH (ref 0.44–1.00)
GFR calc non Af Amer: 49 mL/min — ABNORMAL LOW (ref >60)
GFR, EST AFRICAN AMERICAN: 57 mL/min — AB (ref >60)
Glucose, Bld: 117 mg/dL — ABNORMAL HIGH (ref 70–99)
Potassium: 3.5 mmol/L (ref 3.5–5.1)
SODIUM: 138 mmol/L (ref 135–145)

## 2018-09-11 LAB — URINE CULTURE: Culture: <10000 — AB

## 2018-09-11 MED ORDER — LORAZEPAM 2 MG/ML IJ SOLN
0.5000 mg | Freq: Once | INTRAMUSCULAR | Status: AC
  Administered 2018-09-11: 0.5 mg via INTRAVENOUS
  Filled 2018-09-11: qty 1

## 2018-09-11 MED ORDER — CEFUROXIME AXETIL 500 MG PO TABS: 500.0000 mg | ORAL_TABLET | Freq: Two times a day (BID) | ORAL | 0 refills | Status: AC

## 2018-09-11 NOTE — Discharge Instructions (Signed)
Pyelonephritis, Adult    Pyelonephritis is a kidney infection. The kidneys are the organs that filter a person's blood and move waste out of the bloodstream and into the urine. Urine passes from the kidneys, through the ureters, and into the bladder. There are two main types of pyelonephritis:  · Infections that come on quickly without any warning (acute pyelonephritis).  · Infections that last for a long period of time (chronic pyelonephritis).  In most cases, the infection clears up with treatment and does not cause further problems. More severe infections or chronic infections can sometimes spread to the bloodstream or lead to other problems with the kidneys.  What are the causes?  This condition is usually caused by:  · Bacteria traveling from the bladder to the kidney through infected urine. The urine in the bladder can become infected with bacteria from:  ? Bladder infection (cystitis).  ? Inflammation of the prostate gland (prostatitis).  ? Sexual intercourse, in females.  · Bacteria traveling from the bloodstream to the kidney.  What increases the risk?  This condition is more likely to develop in:  · Pregnant women.  · Older people.  · People who have diabetes.  · People who have kidney stones or bladder stones.  · People who have other abnormalities of the kidney or ureter.  · People who have a catheter placed in the bladder.  · People who have cancer.  · People who are sexually active.  · Women who use spermicides.  · People who have had a prior urinary tract infection.  What are the signs or symptoms?  Symptoms of this condition include:  · Frequent urination.  · Strong or persistent urge to urinate.  · Burning or stinging when urinating.  · Abdominal pain.  · Back pain.  · Pain in the side or flank area.  · Fever.  · Chills.  · Blood in the urine, or dark urine.  · Nausea.  · Vomiting.  How is this diagnosed?  This condition may be diagnosed based on:  · Medical history and physical exam.  · Urine  tests.  · Blood tests.  You may also have imaging tests of the kidneys, such as an ultrasound or CT scan.  How is this treated?  Treatment for this condition may depend on the severity of the infection.  · If the infection is mild and is found early, you may be treated with antibiotic medicines taken by mouth. You will need to drink fluids to remain hydrated.  · If the infection is more severe, you may need to stay in the hospital and receive antibiotics given directly into a vein through an IV tube. You may also need to receive fluids through an IV tube if you are not able to remain hydrated. After your hospital stay, you may need to take oral antibiotics for a period of time.  Other treatments may be required, depending on the cause of the infection.  Follow these instructions at home:  Medicines  · Take over-the-counter and prescription medicines only as told by your health care provider.  · If you were prescribed an antibiotic medicine, take it as told by your health care provider. Do not stop taking the antibiotic even if you start to feel better.  General instructions  · Drink enough fluid to keep your urine clear or pale yellow.  · Avoid caffeine, tea, and carbonated beverages. They tend to irritate the bladder.  · Urinate often. Avoid holding in urine for   long periods of time.  · Urinate before and after sex.  · After a bowel movement, women should cleanse from front to back. Use each tissue only once.  · Keep all follow-up visits as told by your health care provider. This is important.  Contact a health care provider if:  · Your symptoms do not get better after 2 days of treatment.  · Your symptoms get worse.  · You have a fever.  Get help right away if:  · You are unable to take your antibiotics or fluids.  · You have shaking chills.  · You vomit.  · You have severe flank or back pain.  · You have extreme weakness or fainting.  This information is not intended to replace advice given to you by your health  care provider. Make sure you discuss any questions you have with your health care provider.  Document Released: 08/27/2005 Document Revised: 02/02/2016 Document Reviewed: 12/20/2014  Elsevier Interactive Patient Education © 2019 [employer]   

## 2018-09-11 NOTE — Discharge Summary (Signed)
Physician Discharge Summary  Amy Bruce:[id number] DOB: 10-Feb-1951 DOA: 09/09/2018  PCP: Amy Pepper, MD  Admit date: 09/09/2018 Discharge date: 09/11/2018  Admitted From: home Discharge disposition: home   Recommendations for Outpatient Follow-Up:   Holding ACE inhibitor   Discharge Diagnosis:   Principal Problem:   Pyelonephritis Active Problems:   Chronic pain syndrome   Hypertension    Discharge Condition: Improved.  Diet recommendation: Low sodium, heart healthy  Wound care: None.  Code status: Full.   History of Present Illness:   Amy Bruce is a 68 y.o. female with medical history significant of HTN, HLD.  Patient presents to the ED with c/o fever, flank pain, dysuria, foul smelling urine.  Onset Monday night / tues morning.  Worsening since that time.  Pain radiates to groin.  Nausea but no vomiting.  No cough nor URI symptoms.   Hospital Course by Problem:   Pyelonephritis/UTI with hematuria Rocephin-- change to ceftin--- culture done after IV abx U/S ok CT renal- no stone or blockage  Chronic pain syndrome - continue flexeril, neurontin, voltaren  HTN - holding lisinopril as BP on lower side    Medical Consultants:      Discharge Exam:   Vitals:   09/10/18 2149 09/11/18 0520  BP: (!) 96/58 (!) 100/52  Pulse: 84 83  Resp: 14 16  Temp: 97.6 F (36.4 C) 97.7 F (36.5 C)  SpO2: 100% 97%   Vitals:   09/10/18 1452 09/10/18 1553 09/10/18 2149 09/11/18 0520  BP: (!) 103/58 (!) 102/56 (!) 96/58 (!) 100/52  Pulse: (!) 105 96 84 83  Resp: 16 13 14 16   Temp: 99 F (37.2 C) 97.9 F (36.6 C) 97.6 F (36.4 C) 97.7 F (36.5 C)  TempSrc: Oral Oral Oral Oral  SpO2: 96% 96% 100% 97%  Weight:      Height:        General exam: Appears calm and comfortable.  The results of significant diagnostics from this hospitalization (including imaging, microbiology, ancillary and laboratory) are listed below for reference.      Procedures and Diagnostic Studies:   Dg Chest 2 View  Result Date: 09/09/2018 CLINICAL DATA:  Fever EXAM: CHEST - 2 VIEW COMPARISON:  CT 03/24/2018 FINDINGS: No focal opacity or pleural effusion. Normal heart size. Aortic atherosclerosis. No pneumothorax. IMPRESSION: No active cardiopulmonary disease. Electronically Signed   By: Donavan Foil M.D.   On: 09/09/2018 18:57   US Renal  Result Date: 09/10/2018 CLINICAL DATA:  Pyelonephritis. EXAM: RENAL / URINARY TRACT ULTRASOUND COMPLETE COMPARISON:  CT of the abdomen and pelvis from 11/25/2017 FINDINGS: Right Kidney: Renal measurements: 10.3 x 4.7 x 6.2 cm = volume: 156.7 mL . Echogenicity within normal limits. No mass or hydronephrosis visualized. Left Kidney: Renal measurements: 9.7 x 5.1 x 5.7 cm = volume: 147.0 mL. Echogenicity within normal limits. No mass or hydronephrosis visualized. Bladder: Appears normal for degree of bladder distention. IMPRESSION: Unremarkable renal ultrasound. Note that pyelonephritis is often difficult to characterize on ultrasound. Electronically Signed   By: Garald Balding M.D.   On: 09/10/2018 04:56     Labs:   Basic Metabolic Panel: Recent Labs  Lab 09/09/18 1813 09/10/18 0348 09/11/18 0212  NA 137 136 138  K 4.4 4.1 3.5  CL 99 99 107  CO2 28 26 23   GLUCOSE 112* 108* 117*  BUN 14 14 19   CREATININE 1.03* 1.16* 1.15*  CALCIUM 9.8 9.4 8.3*   GFR Estimated Creatinine  Clearance: 42.2 mL/min (A) (by C-G formula based on SCr of 1.15 mg/dL (H)). Liver Function Tests: Recent Labs  Lab 09/09/18 1813  AST 19  ALT 22  ALKPHOS 63  BILITOT 0.3  PROT 7.9  ALBUMIN 3.8   No results for input(s): LIPASE, AMYLASE in the last 168 hours. No results for input(s): AMMONIA in the last 168 hours. Coagulation profile Recent Labs  Lab 09/09/18 1813  INR 1.07    CBC: Recent Labs  Lab 09/09/18 1813 09/10/18 0348 09/11/18 0212  WBC 11.0* 10.6* 7.1  NEUTROABS 8.6*  --   --   HGB 12.2 11.9* 9.9*  HCT  37.8 36.1 31.1*  MCV 90.6 90.5 90.1  PLT 212 203 165   Cardiac Enzymes: No results for input(s): CKTOTAL, CKMB, CKMBINDEX, TROPONINI in the last 168 hours. BNP: Invalid input(s): POCBNP CBG: No results for input(s): GLUCAP in the last 168 hours. D-Dimer No results for input(s): DDIMER in the last 72 hours. Hgb A1c No results for input(s): HGBA1C in the last 72 hours. Lipid Profile No results for input(s): CHOL, HDL, LDLCALC, TRIG, CHOLHDL, LDLDIRECT in the last 72 hours. Thyroid function studies No results for input(s): TSH, T4TOTAL, T3FREE, THYROIDAB in the last 72 hours.  Invalid input(s): FREET3 Anemia work up No results for input(s): VITAMINB12, FOLATE, FERRITIN, TIBC, IRON, RETICCTPCT in the last 72 hours. Microbiology Recent Results (from the past 240 hour(s))  Culture, blood (Routine x 2)     Status: None (Preliminary result)   Collection Time: 09/09/18  6:10 PM  Result Value Ref Range Status   Specimen Description BLOOD LEFT ARM  Final   Special Requests   Final    BOTTLES DRAWN AEROBIC AND ANAEROBIC Blood Culture adequate volume   Culture   Final    NO GROWTH 2 DAYS Performed at [facility], [address] [place], [place] [address]    Report Status PENDING  Incomplete  Culture, blood (Routine x 2)     Status: None (Preliminary result)   Collection Time: 09/09/18  6:14 PM  Result Value Ref Range Status   Specimen Description BLOOD RIGHT ARM  Final   Special Requests   Final    BOTTLES DRAWN AEROBIC AND ANAEROBIC Blood Culture adequate volume   Culture   Final    NO GROWTH 2 DAYS Performed at [facility], [address], [place], [place] [address]    Report Status PENDING  Incomplete  Culture, Urine     Status: Abnormal   Collection Time: 09/10/18  8:30 AM  Result Value Ref Range Status   Specimen Description URINE, RANDOM  Final   Special Requests NONE  Final   Culture (A)  Final    <10,000 COLONIES/mL INSIGNIFICANT GROWTH Performed at  [facility] [address] [place], [place] [address]    Report Status 09/11/2018 FINAL  Final     Discharge Instructions:   Discharge Instructions    Diet - low sodium heart healthy   Complete by:  As directed    Discharge instructions   Complete by:  As directed    Hold lisinopril until seen by PCP-- blood pressure was on the low side here Be sure to stay hydrated   Increase activity slowly   Complete by:  As directed      Allergies as of 09/11/2018      Reactions   Duloxetine Hcl Swelling, Other (See Comments), Hypertension   OTHER REACTIONS UNSPECIFIED  Carisoprodol Swelling, Other (See Comments)   SWELLING REACTION UNSPECIFIED    Sulfa Antibiotics       Medication List    STOP taking these medications   lisinopril 20 MG tablet Commonly known as:  PRINIVIL,ZESTRIL     TAKE these medications   aspirin EC 81 MG tablet Take 81 mg by mouth daily.   atorvastatin 20 MG tablet Commonly known as:  LIPITOR Take 20 mg by mouth daily at 6 PM.   calcium carbonate 500 MG chewable tablet Commonly known as:  TUMS - dosed in mg elemental calcium Chew 2 tablets by mouth daily as needed for indigestion or heartburn.   cefUROXime 500 MG tablet Commonly known as:  CEFTIN Take 1 tablet (500 mg total) by mouth 2 (two) times daily for 10 days.   cilostazol 100 MG tablet Commonly known as:  PLETAL Take 1 tablet (100 mg total) by mouth 2 (two) times daily.   cyclobenzaprine 10 MG tablet Commonly known as:  FLEXERIL TAKE 1 TABLET BY MOUTH THREE TIMES DAILY AS NEEDED FOR MUSCLE SPASMS What changed:    when to take this  additional instructions   diclofenac 50 MG EC tablet Commonly known as:  VOLTAREN TAKE 1 TABLET BY MOUTH THREE TIMES DAILY   gabapentin 600 MG tablet Commonly known as:  NEURONTIN TAKE 1 TABLET BY MOUTH THREE TIMES DAILY   lidocaine 5 % Commonly known as:  LIDODERM UNWRAP AND APPLY 1 PATCH TO SKIN DAILY. REMOVE AND DISCARD PATCH WITHIN 12  HOURS OR AS DIRECTED BY MD. What changed:    how much to take  how to take this  when to take this  reasons to take this  additional instructions   omeprazole 20 MG capsule Commonly known as:  PRILOSEC Take 20 mg by mouth daily.   VENTOLIN HFA 108 (90 Base) MCG/ACT inhaler Generic drug:  albuterol Inhale 1-2 puffs into the lungs every 4 (four) hours as needed for wheezing.   vitamin B-12 500 MCG tablet Commonly known as:  CYANOCOBALAMIN Take 500 mcg by mouth daily.      Follow-up Information    Amy Pepper, MD Follow up in 1 week(s).   Specialty:  Family Medicine Why:  BP check-- may need to restart lisinopril Contact information: [address] [place] [address] [phone]            Time coordinating discharge: 25 min  Signed:  Geradine Girt DO  [facility] 09/11/2018, 3:01 PM

## 2018-09-12 LAB — BLOOD CULTURE ID PANEL (REFLEXED)
Acinetobacter baumannii: NOT DETECTED
CANDIDA TROPICALIS: NOT DETECTED
Candida albicans: NOT DETECTED
Candida glabrata: NOT DETECTED
Candida krusei: NOT DETECTED
Candida parapsilosis: NOT DETECTED
Carbapenem resistance: NOT DETECTED
Enterobacter cloacae complex: NOT DETECTED
Enterobacteriaceae species: DETECTED — AB
Enterococcus species: NOT DETECTED
Escherichia coli: NOT DETECTED
Haemophilus influenzae: NOT DETECTED
KLEBSIELLA PNEUMONIAE: DETECTED — AB
Klebsiella oxytoca: NOT DETECTED
Listeria monocytogenes: NOT DETECTED
Neisseria meningitidis: NOT DETECTED
PROTEUS SPECIES: NOT DETECTED
Pseudomonas aeruginosa: NOT DETECTED
STAPHYLOCOCCUS SPECIES: NOT DETECTED
Serratia marcescens: NOT DETECTED
Staphylococcus aureus (BCID): NOT DETECTED
Streptococcus agalactiae: NOT DETECTED
Streptococcus pneumoniae: NOT DETECTED
Streptococcus pyogenes: NOT DETECTED
Streptococcus species: NOT DETECTED

## 2018-09-12 NOTE — Progress Notes (Signed)
PHARMACY - PHYSICIAN COMMUNICATION CRITICAL VALUE ALERT - BLOOD CULTURE IDENTIFICATION (BCID)  Amy Bruce is an 68 y.o. female who presented to [facility] on 09/09/2018 with a chief complaint of Fever, Flank pain  Assessment:   Pt was treated with rocephin 1/1 >> 1/2 for Pyelonephritis/UTI, then discharged home on 1/2 with a 10 day course of Ceftin.  1/2 blood culture grew Klebsiella Pneumoniae, no carbapenem resistance.   Name of physician (or Provider) Contacted: Text paged Dr. Eliseo Squires   Current antibiotics:  rocephin 1/1 >> 1/2 ceftin 1/2 >> 1/12  Changes to prescribed antibiotics recommended:  The treatment was appropriate. No additional recommendations.   Results for orders placed or performed during the hospital encounter of 09/09/18  Blood Culture ID Panel (Reflexed) (Collected: 09/09/2018  6:14 PM)  Result Value Ref Range   Enterococcus species NOT DETECTED NOT DETECTED   Listeria monocytogenes NOT DETECTED NOT DETECTED   Staphylococcus species NOT DETECTED NOT DETECTED   Staphylococcus aureus (BCID) NOT DETECTED NOT DETECTED   Streptococcus species NOT DETECTED NOT DETECTED   Streptococcus agalactiae NOT DETECTED NOT DETECTED   Streptococcus pneumoniae NOT DETECTED NOT DETECTED   Streptococcus pyogenes NOT DETECTED NOT DETECTED   Acinetobacter baumannii NOT DETECTED NOT DETECTED   Enterobacteriaceae species DETECTED (A) NOT DETECTED   Enterobacter cloacae complex NOT DETECTED NOT DETECTED   Escherichia coli NOT DETECTED NOT DETECTED   Klebsiella oxytoca NOT DETECTED NOT DETECTED   Klebsiella pneumoniae DETECTED (A) NOT DETECTED   Proteus species NOT DETECTED NOT DETECTED   Serratia marcescens NOT DETECTED NOT DETECTED   Carbapenem resistance NOT DETECTED NOT DETECTED   Haemophilus influenzae NOT DETECTED NOT DETECTED   Neisseria meningitidis NOT DETECTED NOT DETECTED   Pseudomonas aeruginosa NOT DETECTED NOT DETECTED   Candida albicans NOT DETECTED NOT DETECTED   Candida glabrata NOT DETECTED NOT DETECTED   Candida krusei NOT DETECTED NOT DETECTED   Candida parapsilosis NOT DETECTED NOT DETECTED   Candida tropicalis NOT DETECTED NOT DETECTED    Manley Mason 09/12/2018  8:28 PM

## 2018-09-14 LAB — CULTURE, BLOOD (ROUTINE X 2)
Culture: NO GROWTH
Special Requests: ADEQUATE

## 2018-09-15 LAB — CULTURE, BLOOD (ROUTINE X 2): Special Requests: ADEQUATE

## 2018-09-16 ENCOUNTER — Other Ambulatory Visit: Payer: Self-pay | Admitting: Physical Medicine & Rehabilitation

## 2018-09-24 ENCOUNTER — Encounter: Payer: Medicare Other | Attending: Physical Medicine & Rehabilitation | Admitting: Physical Medicine & Rehabilitation

## 2018-10-17 ENCOUNTER — Encounter: Payer: Self-pay | Admitting: Physical Medicine & Rehabilitation

## 2018-10-17 ENCOUNTER — Encounter: Payer: Medicare Other | Attending: Physical Medicine & Rehabilitation | Admitting: Physical Medicine & Rehabilitation

## 2018-10-17 VITALS — BP 127/76 | HR 111 | Ht 60.0 in | Wt 162.0 lb

## 2018-10-17 DIAGNOSIS — M542 Cervicalgia: Secondary | ICD-10-CM

## 2018-10-17 DIAGNOSIS — M722 Plantar fascial fibromatosis: Secondary | ICD-10-CM

## 2018-10-17 DIAGNOSIS — R200 Anesthesia of skin: Secondary | ICD-10-CM | POA: Diagnosis not present

## 2018-10-17 DIAGNOSIS — M791 Myalgia, unspecified site: Secondary | ICD-10-CM

## 2018-10-17 DIAGNOSIS — I739 Peripheral vascular disease, unspecified: Secondary | ICD-10-CM

## 2018-10-17 DIAGNOSIS — R202 Paresthesia of skin: Secondary | ICD-10-CM

## 2018-10-17 DIAGNOSIS — M5412 Radiculopathy, cervical region: Secondary | ICD-10-CM

## 2018-10-17 DIAGNOSIS — G894 Chronic pain syndrome: Secondary | ICD-10-CM | POA: Diagnosis not present

## 2018-10-17 DIAGNOSIS — M25511 Pain in right shoulder: Secondary | ICD-10-CM | POA: Diagnosis present

## 2018-10-17 DIAGNOSIS — G8929 Other chronic pain: Secondary | ICD-10-CM

## 2018-10-17 DIAGNOSIS — R000 Tachycardia, unspecified: Secondary | ICD-10-CM

## 2018-10-17 DIAGNOSIS — F17200 Nicotine dependence, unspecified, uncomplicated: Secondary | ICD-10-CM

## 2018-10-17 DIAGNOSIS — G5601 Carpal tunnel syndrome, right upper limb: Secondary | ICD-10-CM

## 2018-10-17 DIAGNOSIS — M898X1 Other specified disorders of bone, shoulder: Secondary | ICD-10-CM

## 2018-10-17 DIAGNOSIS — Z720 Tobacco use: Secondary | ICD-10-CM

## 2018-10-17 MED ORDER — METHYLPREDNISOLONE 4 MG PO TBPK: ORAL_TABLET | ORAL | 0 refills | Status: DC

## 2018-10-17 NOTE — Addendum Note (Signed)
Addended by: Delice Lesch A on: 10/17/2018 11:05 AM   Modules accepted: Orders

## 2018-10-17 NOTE — Progress Notes (Addendum)
Subjective:    Patient ID: Amy Bruce, female    DOB: May 11, 1951, 68 y.o.   MRN: [id number]  HPI  68 y/o female with pmh/psh ofHTN, anemia, B12 deficiency, rheumatic fever, cervical fusion present for follow up for general pain.   Initially stated: Present since fusion 1991, getting worse in last couple weeks.  She fractured her humerus in 2016 and states she was slinged.  No alleviating factors. Everything exacerbates the pain.  Sharp pain.  Radiates to hands.  She has associated numbness.  Ibu does not help. Narco, flexaril helps. Denies falls. Pain limits from doing ADLs. Pt notes she had similar symptoms on left side before first fusion. Pt relocated 12/2014 from [place], [place] where she was being followed by Pain clinic.    Last clinic visit 03/21/18. Since last visit, patient has a long list of complaints (please see media tab). She was admitted to the hospital since last visit for pyelonephritis. She states her current symptoms started prior to her injection and has gotten worse.  She continues to taken Diclofenac and Gabapentin, and Flexaril. Continues to smoke 2 cig/day. She continues to see Vascular due to PVD and is currently monitoring with plans for bypass, however her Cardiologist plans to due stent.    Pain Inventory Average Pain 8 Pain Right Now 9 My pain is sharp, burning and tingling  In the last 24 hours, has pain interfered with the following? General activity 4 Relation with others 2 Enjoyment of life 3 What TIME of day is your pain at its worst? all Sleep (in general) Good  Pain is worse with: some activites Pain improves with: medication and TENS Relief from Meds: 5  Mobility walk without assistance how many minutes can you walk? 2 ability to climb steps?  yes do you drive?  yes Do you have any goals in this area?  yes  Function retired  Neuro/Psych weakness numbness tingling spasms  Prior Studies Any changes since last visit?   no  Physicians involved in your care Any changes since last visit?  no   No pertinent family history of neck/shoulder pain. Social History   Socioeconomic History  . Marital status: Divorced    Spouse name: Not on file  . Number of children: Not on file  . Years of education: Not on file  . Highest education level: Not on file  Occupational History  . Not on file  Social Needs  . Financial resource strain: Not on file  . Food insecurity:    Worry: Not on file    Inability: Not on file  . Transportation needs:    Medical: Not on file    Non-medical: Not on file  Tobacco Use  . Smoking status: Current Every Day Smoker    Packs/day: 1.00    Years: 33.00    Pack years: 33.00    Types: Cigarettes  . Smokeless tobacco: Never Used  . Tobacco comment: 2 cigarettes per day  Substance and Sexual Activity  . Alcohol use: Yes    Comment: 1-2 wk  . Drug use: No  . Sexual activity: Not on file  Lifestyle  . Physical activity:    Days per week: Not on file    Minutes per session: Not on file  . Stress: Not on file  Relationships  . Social connections:    Talks on phone: Not on file    Gets together: Not on file    Attends religious service: Not on file  Active member of club or organization: Not on file    Attends meetings of clubs or organizations: Not on file    Relationship status: Not on file  Other Topics Concern  . Not on file  Social History Narrative  . Not on file   Past surgical history: Cervical fusion. Past medical history: HTN, anemia, B12 deficiency, rheumatic fever, chronic pain BP 127/76   Pulse (!) 111   Ht 5' (1.524 m)   Wt 162 lb (73.5 kg)   SpO2 98%   BMI 31.64 kg/m   Opioid Risk Score:   Fall Risk Score:  `1  Depression screen PHQ 2/9  Depression screen [facility] 2/9 12/12/2016 11/08/2016 10/11/2016  Decreased Interest 0 0 0  Down, Depressed, Hopeless 0 0 0  PHQ - 2 Score 0 0 0  Altered sleeping - - 0  Tired, decreased energy - - 0  Change in  appetite - - 0  Feeling bad or failure about yourself  - - 0  Trouble concentrating - - 0  Moving slowly or fidgety/restless - - 0  Suicidal thoughts - - 0  PHQ-9 Score - - 0  Difficult doing work/chores - - Not difficult at all   Review of Systems  Constitutional: Positive for unexpected weight change.       Weight gain in relatively short amount of time  HENT: Negative.   Eyes: Negative.   Respiratory: Negative.   Cardiovascular: Negative.   Gastrointestinal: Negative.   Endocrine: Negative.   Genitourinary: Negative.   Musculoskeletal: Positive for arthralgias, back pain, myalgias and neck pain.       Spasms  Skin: Negative.   Allergic/Immunologic: Negative.   Neurological: Positive for weakness and numbness.       Tingling   Hematological: Negative.   Psychiatric/Behavioral: Negative.   All other systems reviewed and are negative.     Objective:   Physical Exam Gen: NAD. Vital signs reviewed HENT: Normocephalic, Atraumatic. Eyes: EOMI. No discharge.  Cardio: +Tachycardia. Regular rhythm. No JVD. Pulm: +Ronchi.  [place] normal. Abd: Nondistended, BS+ MSK:  Gait WNL.   +TTP right traps, and right scapula.    No edema.  Neuro:   Strength 5/5 in all LE myotomes (Right side slightly limited by pain) Skin: Warm and Dry. Intact    Assessment & Plan:  68 y/o female with pmh/psh of  HTN, anemia, B12 deficiency, rheumatic fever, cervical fusion present for follow up of general pain.    1. 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 Will reorder MRI given exacerbation of symptoms.   NCS/EMG showing subacute/chronic C5 radic.   Labs reviewed  Heat/Cold, Baclofen, Lidoderm patch, Mobic ineffective, unable to tolerate Cymbalta  Xray reviewed, showing mild chronic changes  Cont HEP, completed PT  Cont TENS  Cont Gabapentin 600 TID  Cont diclofenac 50 TID, may continue  per Vascular   Cont Flexaril 10 TID  Steroid dose pack ordered   2. Myalgia and muscle spasms  Will consider Trigger point injections based on MRI  See #1  3. Right CTS  See #1  Cont brace qhs  4. Tobacco abuse  Smoking 2 cig/day still, encouraged abstinence   Cont to follow up with PCP for gum/meds/etc  5. Hx of post herpatic neuralgia  See #1  6. PVD  Limiting mobility  B/l LE  Cont follow up with Vascular/Cards  8. Plantar fascitis  Cont follow  up with podiatry  Encourage stretching, frozen bottle, bracing  Cont TENS

## 2018-10-17 NOTE — Progress Notes (Deleted)
Subjective:    Patient ID: Amy Bruce, female    DOB: [birth year], 68 y.o.   MRN: [id number]  HPI   Pain Inventory Average Pain 9 Pain Right Now 8 My pain is sharp, burning and tingling  In the last 24 hours, has pain interfered with the following? General activity 4 Relation with others 2 Enjoyment of life 4 What TIME of day is your pain at its worst? all Sleep (in general) Good  Pain is worse with: some activites Pain improves with: medication and TENS Relief from Meds: 5  Mobility walk without assistance how many minutes can you walk? 2 ability to climb steps?  yes do you drive?  yes  Function retired  Neuro/Psych weakness numbness tingling spasms  Prior Studies Any changes since last visit?  no  Physicians involved in your care Any changes since last visit?  no   Family History  Problem Relation Age of Onset  . Ovarian cancer Mother    Social History   Socioeconomic History  . Marital status: Divorced    Spouse name: Not on file  . Number of children: Not on file  . Years of education: Not on file  . Highest education level: Not on file  Occupational History  . Not on file  Social Needs  . Financial resource strain: Not on file  . Food insecurity:    Worry: Not on file    Inability: Not on file  . Transportation needs:    Medical: Not on file    Non-medical: Not on file  Tobacco Use  . Smoking status: Current Every Day Smoker    Packs/day: 1.00    Years: 33.00    Pack years: 33.00    Types: Cigarettes  . Smokeless tobacco: Never Used  . Tobacco comment: 2 cigarettes per day  Substance and Sexual Activity  . Alcohol use: Yes    Comment: 1-2 wk  . Drug use: No  . Sexual activity: Not on file  Lifestyle  . Physical activity:    Days per week: Not on file    Minutes per session: Not on file  . Stress: Not on file  Relationships  . Social connections:    Talks on phone: Not on file    Gets together: Not on file    Attends  religious service: Not on file    Active member of club or organization: Not on file    Attends meetings of clubs or organizations: Not on file    Relationship status: Not on file  Other Topics Concern  . Not on file  Social History Narrative  . Not on file   Past Surgical History:  Procedure Laterality Date  . ABDOMINAL AORTOGRAM W/LOWER EXTREMITY N/A 04/02/2018   Procedure: ABDOMINAL AORTOGRAM W/LOWER EXTREMITY;  Surgeon: Wellington Hampshire, MD;  Location: [facility];  Service: Cardiovascular;  Laterality: N/A;  . ABDOMINAL HYSTERECTOMY  1999  . [place]   C5/6 6/7  . COLONOSCOPY    . Left axillary cyst removal 1989    . MULTIPLE EXTRACTIONS WITH ALVEOLOPLASTY N/A 10/25/2017   Procedure: EXTRACTION NUMBERS FIVE, SEVEN, EIGHT, NINE, TEN, FIFTEEN, EIGHTEEN WITH ALVEOLOPLASTY, IRRIGATION AND DEBRIDEMENT LEFT SUBMANDIBULAR INFECTION;  Surgeon: Diona Browner, DDS;  Location: [facility];  Service: Oral Surgery;  Laterality: N/A;  . TONSILECTOMY, ADENOIDECTOMY, BILATERAL MYRINGOTOMY AND TUBES  1981  . TUBAL LIGATION  1981   Past Medical History:  Diagnosis Date  . Anemia  in the past  . Anxiety   . Arthritis   . GERD (gastroesophageal reflux disease)   . Heart murmur    due to rheumatic fever, PCP no longer hears it  . Hyperlipidemia   . Hypertension   . Myalgia   . Neuropathy   . PAD (peripheral artery disease) ([place])   . Pre-diabetes   . Tobacco abuse    BP 127/76   Pulse (!) 111   Ht 5' (1.524 m)   Wt 162 lb (73.5 kg)   SpO2 98%   BMI 31.64 kg/m   Opioid Risk Score:   Fall Risk Score:  `1  Depression screen PHQ 2/9  Depression screen [facility] 2/9 12/12/2016 11/08/2016 10/11/2016  Decreased Interest 0 0 0  Down, Depressed, Hopeless 0 0 0  PHQ - 2 Score 0 0 0  Altered sleeping - - 0  Tired, decreased energy - - 0  Change in appetite - - 0  Feeling bad or failure about yourself  - - 0  Trouble concentrating - - 0  Moving slowly or fidgety/restless -  - 0  Suicidal thoughts - - 0  PHQ-9 Score - - 0  Difficult doing work/chores - - Not difficult at all    Review of Systems  Constitutional: Positive for unexpected weight change.  HENT: Negative.   Eyes: Negative.   Respiratory: Negative.   Cardiovascular: Negative.   Gastrointestinal: Negative.   Endocrine: Negative.   Genitourinary: Negative.   Musculoskeletal: Negative.        Spasms   Skin: Negative.   Allergic/Immunologic: Negative.   Neurological: Positive for weakness and numbness.       Tingling  Hematological: Negative.   Psychiatric/Behavioral: Negative.   All other systems reviewed and are negative.      Objective:   Physical Exam        Assessment & Plan:

## 2018-11-11 ENCOUNTER — Other Ambulatory Visit: Payer: Self-pay | Admitting: Physical Medicine & Rehabilitation

## 2018-11-12 ENCOUNTER — Other Ambulatory Visit: Payer: Self-pay | Admitting: Physical Medicine & Rehabilitation

## 2018-11-14 ENCOUNTER — Encounter: Payer: Medicare Other | Admitting: Physical Medicine & Rehabilitation

## 2018-11-17 ENCOUNTER — Encounter (HOSPITAL_COMMUNITY): Payer: Self-pay | Admitting: *Deleted

## 2018-11-17 ENCOUNTER — Other Ambulatory Visit: Payer: Self-pay

## 2018-11-17 NOTE — Anesthesia Preprocedure Evaluation (Addendum)
Anesthesia Evaluation  Patient identified by MRN, date of birth, ID band Patient awake    Reviewed: Allergy & Precautions, NPO status , Patient's Chart, lab work & pertinent test results  Airway Mallampati: III  TM Distance: >3 FB Neck ROM: Full    Dental  (+) Dental Advisory Given   Pulmonary Current Smoker,    breath sounds clear to auscultation       Cardiovascular hypertension, Pt. on medications + Peripheral Vascular Disease   Rhythm:Regular Rate:Normal  05/2018 Nuclear stress test The left ventricular ejection fraction is hyperdynamic (>65%). Nuclear stress EF: 76%.There was no ST segment deviation noted during stress.The study is normal.     Neuro/Psych Cervical radiculopathy  Neuromuscular disease    GI/Hepatic Neg liver ROS, GERD  ,  Endo/Other  negative endocrine ROS  Renal/GU negative Renal ROS     Musculoskeletal  (+) Arthritis , Fibromyalgia -  Abdominal   Peds  Hematology negative hematology ROS (+) anemia ,   Anesthesia Other Findings   Reproductive/Obstetrics                            Anesthesia Physical Anesthesia Plan  ASA: II  Anesthesia Plan: General   Post-op Pain Management:    Induction: Intravenous  PONV Risk Score and Plan: 2 and Dexamethasone, Ondansetron and Treatment may vary due to age or medical condition  Airway Management Planned: LMA and Oral ETT  Additional Equipment:   Intra-op Plan:   Post-operative Plan: Extubation in OR  Informed Consent: I have reviewed the patients History and Physical, chart, labs and discussed the procedure including the risks, benefits and alternatives for the proposed anesthesia with the patient or authorized representative who has indicated his/her understanding and acceptance.     Dental advisory given  Plan Discussed with: CRNA  Anesthesia Plan Comments:        Anesthesia Quick Evaluation

## 2018-11-17 NOTE — Progress Notes (Signed)
Amy Bruce denies chest pain or shortness of breath. PCP is Dr. London Pepper, I requested records from 10/27/2018 visit.

## 2018-11-18 ENCOUNTER — Ambulatory Visit (HOSPITAL_COMMUNITY): Payer: Medicare Other | Admitting: Anesthesiology

## 2018-11-18 ENCOUNTER — Encounter (HOSPITAL_COMMUNITY): Payer: Self-pay | Admitting: Certified Registered"

## 2018-11-18 ENCOUNTER — Ambulatory Visit: Payer: Medicare Other | Admitting: Cardiovascular Disease

## 2018-11-18 ENCOUNTER — Ambulatory Visit (HOSPITAL_COMMUNITY)
Admission: RE | Admit: 2018-11-18 | Discharge: 2018-11-18 | Disposition: A | Discharge status: Home / Self Care | Payer: Medicare Other | Attending: Physical Medicine & Rehabilitation | Admitting: Physical Medicine & Rehabilitation

## 2018-11-18 ENCOUNTER — Encounter (HOSPITAL_COMMUNITY)
Admission: RE | Disposition: A | Discharge status: Home / Self Care | Payer: Self-pay | Source: Home / Self Care | Attending: Physical Medicine & Rehabilitation

## 2018-11-18 ENCOUNTER — Ambulatory Visit (HOSPITAL_COMMUNITY)
Admission: RE | Admit: 2018-11-18 | Discharge: 2018-11-18 | Disposition: A | Discharge status: Home / Self Care | Payer: Medicare Other | Source: Ambulatory Visit | Attending: Physical Medicine & Rehabilitation | Admitting: Physical Medicine & Rehabilitation

## 2018-11-18 DIAGNOSIS — I10 Essential (primary) hypertension: Secondary | ICD-10-CM | POA: Diagnosis not present

## 2018-11-18 DIAGNOSIS — Z79899 Other long term (current) drug therapy: Secondary | ICD-10-CM | POA: Diagnosis not present

## 2018-11-18 DIAGNOSIS — M25511 Pain in right shoulder: Secondary | ICD-10-CM | POA: Insufficient documentation

## 2018-11-18 DIAGNOSIS — M5021 Other cervical disc displacement,  high cervical region: Secondary | ICD-10-CM | POA: Diagnosis not present

## 2018-11-18 DIAGNOSIS — M1990 Unspecified osteoarthritis, unspecified site: Secondary | ICD-10-CM | POA: Insufficient documentation

## 2018-11-18 DIAGNOSIS — G894 Chronic pain syndrome: Secondary | ICD-10-CM | POA: Insufficient documentation

## 2018-11-18 DIAGNOSIS — M5412 Radiculopathy, cervical region: Secondary | ICD-10-CM | POA: Insufficient documentation

## 2018-11-18 DIAGNOSIS — K219 Gastro-esophageal reflux disease without esophagitis: Secondary | ICD-10-CM | POA: Insufficient documentation

## 2018-11-18 DIAGNOSIS — M4802 Spinal stenosis, cervical region: Secondary | ICD-10-CM | POA: Insufficient documentation

## 2018-11-18 DIAGNOSIS — M797 Fibromyalgia: Secondary | ICD-10-CM | POA: Insufficient documentation

## 2018-11-18 DIAGNOSIS — Z981 Arthrodesis status: Secondary | ICD-10-CM | POA: Insufficient documentation

## 2018-11-18 DIAGNOSIS — G8929 Other chronic pain: Secondary | ICD-10-CM | POA: Insufficient documentation

## 2018-11-18 DIAGNOSIS — F17200 Nicotine dependence, unspecified, uncomplicated: Secondary | ICD-10-CM | POA: Diagnosis not present

## 2018-11-18 DIAGNOSIS — Z7982 Long term (current) use of aspirin: Secondary | ICD-10-CM | POA: Insufficient documentation

## 2018-11-18 HISTORY — DX: Malignant (primary) neoplasm, unspecified: C80.1

## 2018-11-18 HISTORY — PX: RADIOLOGY WITH ANESTHESIA: SHX6223

## 2018-11-18 HISTORY — DX: Fibromyalgia: M79.7

## 2018-11-18 HISTORY — DX: Respiratory tuberculosis unspecified: A15.9

## 2018-11-18 HISTORY — DX: Tubulo-interstitial nephritis, not specified as acute or chronic: N12

## 2018-11-18 LAB — CBC
HCT: 36.5 % (ref 36.0–46.0)
Hemoglobin: 11.6 g/dL — ABNORMAL LOW (ref 12.0–15.0)
MCH: 28.2 pg (ref 26.0–34.0)
MCHC: 31.8 g/dL (ref 30.0–36.0)
MCV: 88.8 fL (ref 80.0–100.0)
Platelets: 292 10*3/uL (ref 150–400)
RBC: 4.11 MIL/uL (ref 3.87–5.11)
RDW: 15.5 % (ref 11.5–15.5)
WBC: 8.1 10*3/uL (ref 4.0–10.5)
nRBC: 0 % (ref 0.0–0.2)

## 2018-11-18 LAB — BASIC METABOLIC PANEL
Anion gap: 12 (ref 5–15)
BUN: 14 mg/dL (ref 8–23)
CO2: 23 mmol/L (ref 22–32)
Calcium: 9.3 mg/dL (ref 8.9–10.3)
Chloride: 103 mmol/L (ref 98–111)
Creatinine, Ser: 1.05 mg/dL — ABNORMAL HIGH (ref 0.44–1.00)
GFR calc non Af Amer: 55 mL/min — ABNORMAL LOW (ref >60)
Glucose, Bld: 117 mg/dL — ABNORMAL HIGH (ref 70–99)
Potassium: 3.5 mmol/L (ref 3.5–5.1)
SODIUM: 138 mmol/L (ref 135–145)

## 2018-11-18 LAB — GLUCOSE, CAPILLARY: Glucose-Capillary: 115 mg/dL — ABNORMAL HIGH (ref 70–99)

## 2018-11-18 SURGERY — MRI WITH ANESTHESIA
Anesthesia: General

## 2018-11-18 MED ORDER — GLYCOPYRROLATE PF 0.2 MG/ML IJ SOSY
PREFILLED_SYRINGE | INTRAMUSCULAR | Status: DC | PRN
  Administered 2018-11-18: .1 mg via INTRAVENOUS

## 2018-11-18 MED ORDER — ONDANSETRON HCL 4 MG/2ML IJ SOLN
INTRAMUSCULAR | Status: DC | PRN
  Administered 2018-11-18: 4 mg via INTRAVENOUS

## 2018-11-18 MED ORDER — DEXAMETHASONE SODIUM PHOSPHATE 4 MG/ML IJ SOLN
INTRAMUSCULAR | Status: DC | PRN
  Administered 2018-11-18: 10 mg via INTRAVENOUS

## 2018-11-18 MED ORDER — PROPOFOL 10 MG/ML IV BOLUS
INTRAVENOUS | Status: DC | PRN
  Administered 2018-11-18: 200 mg via INTRAVENOUS

## 2018-11-18 MED ORDER — LIDOCAINE 2% (20 MG/ML) 5 ML SYRINGE
INTRAMUSCULAR | Status: DC | PRN
  Administered 2018-11-18: 20 mg via INTRAVENOUS

## 2018-11-18 MED ORDER — MIDAZOLAM HCL 5 MG/5ML IJ SOLN
INTRAMUSCULAR | Status: DC | PRN
  Administered 2018-11-18: 2 mg via INTRAVENOUS

## 2018-11-18 MED ORDER — LACTATED RINGERS IV SOLN
INTRAVENOUS | Status: DC | PRN
  Administered 2018-11-18: 07:00:00 via INTRAVENOUS

## 2018-11-18 NOTE — Transfer of Care (Addendum)
Immediate Anesthesia Transfer of Care Note  Patient: Amy Bruce  Procedure(s) Performed: MRI OF THE CERVICAL SPINE WITHOUT CONTRAST (N/A )  Patient Location: PACU  Anesthesia Type:General  Level of Consciousness: drowsy  Airway & Oxygen Therapy: Patient Spontanous Breathing and Patient connected to face mask oxygen  Post-op Assessment: Report given to RN, Post -op Vital signs reviewed and stable and Patient moving all extremities X 4  Post vital signs: Reviewed and stable  Last Vitals:  Vitals Value Taken Time  BP 120/75 11/18/2018 10:04 AM  Temp    Pulse 88 11/18/2018 10:07 AM  Resp 16 11/18/2018 10:07 AM  SpO2 97 % 11/18/2018 10:07 AM  Vitals shown include unvalidated device data.  Last Pain:  Vitals:   11/18/18 0900  TempSrc:   PainSc: 0-No pain         Complications: No apparent anesthesia complications

## 2018-11-18 NOTE — Anesthesia Postprocedure Evaluation (Signed)
Anesthesia Post Note  Patient: Amy Bruce  Procedure(s) Performed: MRI OF THE CERVICAL SPINE WITHOUT CONTRAST (N/A )     Patient location during evaluation: PACU Anesthesia Type: General Level of consciousness: awake and alert Pain management: pain level controlled Vital Signs Assessment: post-procedure vital signs reviewed and stable Respiratory status: spontaneous breathing, nonlabored ventilation, respiratory function stable and patient connected to nasal cannula oxygen Cardiovascular status: blood pressure returned to baseline and stable Postop Assessment: no apparent nausea or vomiting Anesthetic complications: no    Last Vitals:  Vitals:   11/18/18 0920 11/18/18 0950  BP: 108/71 124/75  Pulse: 91 87  Resp: 13 14  Temp:    SpO2: 94% 98%    Last Pain:  Vitals:   11/18/18 0900  TempSrc:   PainSc: 0-No pain                 [name]

## 2018-11-18 NOTE — Addendum Note (Signed)
Addendum  created 11/18/18 1408 by Suzette Battiest, MD   Clinical Note Signed

## 2018-11-19 ENCOUNTER — Encounter (HOSPITAL_COMMUNITY): Payer: Self-pay | Admitting: Radiology

## 2018-11-20 ENCOUNTER — Encounter: Payer: Self-pay | Admitting: Physical Medicine & Rehabilitation

## 2018-11-20 ENCOUNTER — Other Ambulatory Visit: Payer: Self-pay

## 2018-11-20 ENCOUNTER — Encounter: Payer: Medicare Other | Attending: Physical Medicine & Rehabilitation | Admitting: Physical Medicine & Rehabilitation

## 2018-11-20 VITALS — BP 185/84 | HR 109 | Ht 60.0 in | Wt 166.4 lb

## 2018-11-20 DIAGNOSIS — M898X1 Other specified disorders of bone, shoulder: Secondary | ICD-10-CM

## 2018-11-20 DIAGNOSIS — M792 Neuralgia and neuritis, unspecified: Secondary | ICD-10-CM

## 2018-11-20 DIAGNOSIS — M4802 Spinal stenosis, cervical region: Secondary | ICD-10-CM

## 2018-11-20 DIAGNOSIS — I169 Hypertensive crisis, unspecified: Secondary | ICD-10-CM

## 2018-11-20 DIAGNOSIS — R200 Anesthesia of skin: Secondary | ICD-10-CM

## 2018-11-20 DIAGNOSIS — G8929 Other chronic pain: Secondary | ICD-10-CM

## 2018-11-20 DIAGNOSIS — M542 Cervicalgia: Secondary | ICD-10-CM

## 2018-11-20 DIAGNOSIS — M25511 Pain in right shoulder: Secondary | ICD-10-CM | POA: Diagnosis present

## 2018-11-20 DIAGNOSIS — G894 Chronic pain syndrome: Secondary | ICD-10-CM | POA: Diagnosis present

## 2018-11-20 DIAGNOSIS — M5412 Radiculopathy, cervical region: Secondary | ICD-10-CM | POA: Diagnosis present

## 2018-11-20 DIAGNOSIS — R202 Paresthesia of skin: Secondary | ICD-10-CM

## 2018-11-20 DIAGNOSIS — M791 Myalgia, unspecified site: Secondary | ICD-10-CM | POA: Diagnosis not present

## 2018-11-20 DIAGNOSIS — M47812 Spondylosis without myelopathy or radiculopathy, cervical region: Secondary | ICD-10-CM

## 2018-11-20 DIAGNOSIS — F17200 Nicotine dependence, unspecified, uncomplicated: Secondary | ICD-10-CM

## 2018-11-20 DIAGNOSIS — Z720 Tobacco use: Secondary | ICD-10-CM

## 2018-11-20 MED ORDER — METHYLPREDNISOLONE 4 MG PO TBPK: ORAL_TABLET | ORAL | 0 refills | Status: DC

## 2018-11-20 NOTE — Progress Notes (Addendum)
Subjective:    Patient ID: Amy Bruce, female    DOB: [birth year], 68 y.o.   MRN: [id number]  HPI  68 y/o female with pmh/psh ofHTN, anemia, B12 deficiency, rheumatic fever, cervical fusion present for follow up for general pain.   Initially stated: Present since fusion 1991, getting worse in last couple weeks.  She fractured her humerus in 2016 and states she was slinged.  No alleviating factors. Everything exacerbates the pain.  Sharp pain.  Radiates to hands.  She has associated numbness.  Ibu does not help. Narco, flexaril helps. Denies falls. Pain limits from doing ADLs. Pt notes she had similar symptoms on left side before first fusion. Pt relocated 12/2014 from [place], [place] where she was being followed by Pain clinic.    Last clinic visit 10/17/2018.  Since that time patient had an MRI.  At the time of visit, explained to patient abnormalities based on recall due to lack of E HR service.  Follow-up with patient over the phone to discuss findings in detail as well as neurosurgery referral.  She continues to take her medications.  She obtained labs on last visit as requested.  She continues to smoke approximately 2 cigarettes a day.  Denies falls.  Since last visit she notes increasing pain with uncontrolled twitching in bilateral arms, right greater than left.  She notes increasing pain mainly in her shoulder and arm, but also rating down to her forearm.  She describes as pins-and-needles.  Her BP is significantly elevated today, however patient states it is within normal limits at home.  Pain Inventory Average Pain 8 Pain Right Now 8 My pain is burning and tingling  In the last 24 hours, has pain interfered with the following? General activity 7 Relation with others 1 Enjoyment of life 8 What TIME of day is your pain at its worst? all Sleep (in general) Good  Pain is worse with: sitting, inactivity and some activites Pain improves with: medication and TENS Relief from Meds: 8   Mobility walk without assistance how many minutes can you walk? 2 ability to climb steps?  yes do you drive?  yes Do you have any goals in this area?  yes  Function retired  Neuro/Psych weakness numbness tingling spasms  Prior Studies Any changes since last visit?  no  Physicians involved in your care Any changes since last visit?  no   No pertinent family history of neck/shoulder pain. Social History   Socioeconomic History  . Marital status: Divorced    Spouse name: Not on file  . Number of children: Not on file  . Years of education: Not on file  . Highest education level: Not on file  Occupational History  . Not on file  Social Needs  . Financial resource strain: Not on file  . Food insecurity:    Worry: Not on file    Inability: Not on file  . Transportation needs:    Medical: Not on file    Non-medical: Not on file  Tobacco Use  . Smoking status: Current Every Day Smoker    Packs/day: 1.00    Years: 33.00    Pack years: 33.00    Types: Cigarettes  . Smokeless tobacco: Never Used  . Tobacco comment: 2 cigarettes per day  Substance and Sexual Activity  . Alcohol use: Yes    Alcohol/week: 2.0 standard drinks    Types: 2 Shots of liquor per week    Comment: 1-2 wk  . Drug  use: No  . Sexual activity: Not on file  Lifestyle  . Physical activity:    Days per week: Not on file    Minutes per session: Not on file  . Stress: Not on file  Relationships  . Social connections:    Talks on phone: Not on file    Gets together: Not on file    Attends religious service: Not on file    Active member of club or organization: Not on file    Attends meetings of clubs or organizations: Not on file    Relationship status: Not on file  Other Topics Concern  . Not on file  Social History Narrative  . Not on file   Past surgical history: Cervical fusion. Past medical history: HTN, anemia, B12 deficiency, rheumatic fever, chronic pain BP (!) 185/84   Pulse  (!) 109   Ht 5' (1.524 m)   Wt 166 lb 6.4 oz (75.5 kg)   SpO2 98%   BMI 32.50 kg/m   Opioid Risk Score:   Fall Risk Score:  `1  Depression screen PHQ 2/9  Depression screen [facility] 2/9 11/20/2018 12/12/2016 11/08/2016 10/11/2016  Decreased Interest 0 0 0 0  Down, Depressed, Hopeless 0 0 0 0  PHQ - 2 Score 0 0 0 0  Altered sleeping - - - 0  Tired, decreased energy - - - 0  Change in appetite - - - 0  Feeling bad or failure about yourself  - - - 0  Trouble concentrating - - - 0  Moving slowly or fidgety/restless - - - 0  Suicidal thoughts - - - 0  PHQ-9 Score - - - 0  Difficult doing work/chores - - - Not difficult at all   Review of Systems  Constitutional: Positive for unexpected weight change.       Weight gain in relatively short amount of time  HENT: Negative.   Eyes: Negative.   Respiratory: Negative.   Cardiovascular: Negative.   Gastrointestinal: Negative.   Endocrine: Negative.   Genitourinary: Negative.   Musculoskeletal: Positive for arthralgias, back pain, myalgias and neck pain.       Spasms  Skin: Negative.   Allergic/Immunologic: Negative.   Neurological: Positive for weakness and numbness.       Tingling   Hematological: Negative.   Psychiatric/Behavioral: Negative.   All other systems reviewed and are negative.     Objective:   Physical Exam Gen: NAD. Vital signs reviewed HENT: Normocephalic, Atraumatic. Eyes: EOMI. No discharge.  Cardio: RRR.  No JVD. Pulm: B/l clear to auscultation.  Effort normal. Abd: Soft, BS+ MSK:  Gait WNL.   +TTP right traps, right neck, right scapula, right arm.    No edema.  Neuro:   Strength 4+ /5 in all LE myotomes (Right side greater than left side slightly limited by pain) Skin: Warm and Dry. Intact    Assessment & Plan:  68 y/o female with pmh/psh of  HTN, anemia, B12 deficiency, rheumatic fever, cervical fusion present for follow up of general pain.    1. Chronic right shoulder/scapular and neck pain and  neuropathic pain: Secondary to cervical stenosis, foraminal stenosis, facet arthropathy  MRI 11/2018 reviewed, and reviewed with patient, showing canal stenosis as well as cord compression at C4-C5.  Per report 1. 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 Severe right and moderately severe left foraminal narrowing are also seen at this level. 2. 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 Mild to moderate foraminal narrowing at this level is worse on the right. 3. Facet arthropathy and uncovertebral disease at C7-T1 cause severe left and moderately severe right foraminal narrowing. The central canal is patent. 4. Status post C5-7 fusion.  The central canal and foramina are open.  Previous NCS/EMG showing subacute/chronic C5 radic.   Heat/Cold, Baclofen, Lidoderm patch, Mobic ineffective, unable to tolerate Cymbalta  Xray reviewed, showing mild chronic changes  Cont HEP, completed PT  Cont TENS  Cont Gabapentin 600 TID  Cont diclofenac 50 TID, may continue per Vascular   Cont Flexaril 10 TID   Labs reviewed, stable  Medrol Dosepak ordered  Neurosurgery referral made   2. Myalgia and muscle spasms  Will consider Trigger point injections in future, does not want at present.    See #1  3. Right CTS  See #1  Cont brace qhs  4. Tobacco abuse  Smoking 2 cig/day still  Cont to follow up with PCP for gum/meds/etc  Encourage cessation  5. Hx of post herpatic neuralgia  See #1  6. PVD  Limiting mobility  B/l LE  Cont follow up with Vascular  8. Plantar fascitis  Cont follow up with podiatry  Encourage stretching, frozen bottle, bracing  Cont TENS  9.  Essential hypertension  Extremely elevated this morning, will recheck  Follow-up with PCP if necessary

## 2018-11-25 ENCOUNTER — Encounter (HOSPITAL_COMMUNITY): Payer: Self-pay | Admitting: Radiology

## 2018-12-03 ENCOUNTER — Telehealth: Payer: Self-pay | Admitting: *Deleted

## 2018-12-03 NOTE — Telephone Encounter (Signed)
We can increase her Gabapentin to 900 TID.  Thanks.

## 2018-12-03 NOTE — Telephone Encounter (Signed)
Mrs Amy Bruce may not get in to see the neurosurgeon for any near foreseeable future due to the COVID-19 (she hasnt heard from them)  and is asking if Dr Posey Pronto will increase her meds since he knows her situation so that she can be comfortable until she sees the Psychologist, sport and exercise.

## 2018-12-04 ENCOUNTER — Telehealth: Payer: Self-pay | Admitting: *Deleted

## 2018-12-04 MED ORDER — GABAPENTIN 800 MG PO TABS: 800.0000 mg | ORAL_TABLET | Freq: Three times a day (TID) | ORAL | 0 refills | Status: DC

## 2018-12-04 NOTE — Addendum Note (Signed)
Addended by: Geryl Rankins D on: 12/04/2018 09:21 AM   Modules accepted: Orders

## 2018-12-04 NOTE — Telephone Encounter (Signed)
Left a message for the patient to call back concerning her appointment for next week.

## 2018-12-04 NOTE — Telephone Encounter (Signed)
800 mg tablets are preferred by patients insurance. There is not a 900 mg variant.  300 mg tabs and capsules are not preferred. Should we try 800 mg TID instead or pursue 300 mg capsules 3tabs 3 times a day?

## 2018-12-04 NOTE — Telephone Encounter (Signed)
Medication ordered, contacted patient, left voicemail

## 2018-12-04 NOTE — Telephone Encounter (Signed)
We can try 800 mg 3 times daily.  Thanks.

## 2018-12-05 ENCOUNTER — Telehealth: Payer: Self-pay | Admitting: Cardiovascular Disease

## 2018-12-05 NOTE — Telephone Encounter (Signed)
Left a message for the patient to call back to change her to an evisit.

## 2018-12-05 NOTE — Telephone Encounter (Signed)
Virtual Visit Pre-Appointment Phone Call  Steps For Call:  1. Confirm consent - "In the setting of the current Covid19 crisis, you are scheduled for a (phone or video) visit with your provider on (date) at (time).  Just as we do with many in-office visits, in order for you to participate in this visit, we must obtain consent.  If you'd like, I can send this to your mychart (if signed up) or email for you to review.  Otherwise, I can obtain your verbal consent now.  All virtual visits are billed to your insurance company just like a normal visit would be.  By agreeing to a virtual visit, we'd like you to understand that the technology does not allow for your provider to perform an examination, and thus may limit your provider's ability to fully assess your condition.  Finally, though the technology is pretty good, we cannot assure that it will always work on either your or our end, and in the setting of a video visit, we may have to convert it to a phone-only visit.  In either situation, we cannot ensure that we have a secure connection.  Are you willing to proceed?"  2. Give patient instructions for WebEx download to smartphone as below if video visit  3. Advise patient to be prepared with any vital sign or heart rhythm information, their current medicines, and a piece of paper and pen handy for any instructions they may receive the day of their visit  4. Inform patient they will receive a phone call 15 minutes prior to their appointment time (may be from unknown caller ID) so they should be prepared to answer  5. Confirm that appointment type is correct in Epic appointment notes (video vs telephone)    TELEPHONE CALL NOTE  Amy Bruce has been deemed a candidate for a follow-up tele-health visit to limit community exposure during the Covid-19 pandemic. I spoke with the patient via phone to ensure availability of phone/video source, confirm preferred email & phone number, and discuss  instructions and expectations.  I reminded Amy Bruce to be prepared with any vital sign and/or heart rhythm information that could potentially be obtained via home monitoring, at the time of her visit. I reminded Amy Bruce to expect a phone call at the time of her visit if her visit.  Did the patient verbally acknowledge consent to treatment?  yes  Ace Gins 12/05/2018 3:19 PM   DOWNLOADING THE [place], go to [employer] and type in WebEx in the search bar. [place] [employer], the blue/green circle. The app is free but as with any other app downloads, their phone may require them to verify saved payment information or Apple password. The patient does NOT have to create an account.  - If Android, ask patient to go to [employer] and type in WebEx in the search bar. [place] [employer], the blue/green circle. The app is free but as with any other app downloads, their phone may require them to verify saved payment information or Android password. The patient does NOT have to create an account.   CONSENT FOR TELE-HEALTH VISIT - PLEASE REVIEW  I hereby voluntarily request, consent and authorize [facility] and its employed or contracted physicians, physician assistants, nurse practitioners or other licensed health care professionals (the Practitioner), to provide me with telemedicine health care services (the Services") as deemed necessary by the treating Practitioner. I  acknowledge and consent to receive the Services by the Practitioner via telemedicine. I understand that the telemedicine visit will involve communicating with the Practitioner through live audiovisual communication technology and the disclosure of certain medical information by electronic transmission. I acknowledge that I have been given the opportunity to request an in-person assessment or other available alternative prior to the telemedicine visit and am  voluntarily participating in the telemedicine visit.  I understand that I have the right to withhold or withdraw my consent to the use of telemedicine in the course of my care at any time, without affecting my right to future care or treatment, and that the Practitioner or I may terminate the telemedicine visit at any time. I understand that I have the right to inspect all information obtained and/or recorded in the course of the telemedicine visit and may receive copies of available information for a reasonable fee.  I understand that some of the potential risks of receiving the Services via telemedicine include:   Delay or interruption in medical evaluation due to technological equipment failure or disruption;  Information transmitted may not be sufficient (e.g. poor resolution of images) to allow for appropriate medical decision making by the Practitioner; and/or   In rare instances, security protocols could fail, causing a breach of personal health information.  Furthermore, I acknowledge that it is my responsibility to provide information about my medical history, conditions and care that is complete and accurate to the best of my ability. I acknowledge that Practitioner's advice, recommendations, and/or decision may be based on factors not within their control, such as incomplete or inaccurate data provided by me or distortions of diagnostic images or specimens that may result from electronic transmissions. I understand that the practice of medicine is not an exact science and that Practitioner makes no warranties or guarantees regarding treatment outcomes. I acknowledge that I will receive a copy of this consent concurrently upon execution via email to the email address I last provided but may also request a printed copy by calling the office of [facility].    I understand that my insurance will be billed for this visit.   I have read or had this consent read to me.  I understand the  contents of this consent, which adequately explains the benefits and risks of the Services being provided via telemedicine.   I have been provided ample opportunity to ask questions regarding this consent and the Services and have had my questions answered to my satisfaction.  I give my informed consent for the services to be provided through the use of telemedicine in my medical care  By participating in this telemedicine visit I agree to the above.

## 2018-12-05 NOTE — Telephone Encounter (Signed)
Patient scheduled 3/31 at 8 am telephone e visit with Dr Fletcher Anon

## 2018-12-08 ENCOUNTER — Other Ambulatory Visit: Payer: Self-pay

## 2018-12-09 ENCOUNTER — Telehealth (INDEPENDENT_AMBULATORY_CARE_PROVIDER_SITE_OTHER): Payer: Medicare Other | Admitting: Cardiovascular Disease

## 2018-12-09 DIAGNOSIS — E785 Hyperlipidemia, unspecified: Secondary | ICD-10-CM | POA: Diagnosis not present

## 2018-12-09 DIAGNOSIS — Z720 Tobacco use: Secondary | ICD-10-CM | POA: Diagnosis not present

## 2018-12-09 DIAGNOSIS — I739 Peripheral vascular disease, unspecified: Secondary | ICD-10-CM

## 2018-12-09 NOTE — Patient Instructions (Signed)
Medication Instructions:  Continue same medications If you need a refill on your cardiac medications before your next appointment, please call your pharmacy.   Lab work: None If you have labs (blood work) drawn today and your tests are completely normal, you will receive your results only by: Marland Kitchen MyChart Message (if you have MyChart) OR . A paper copy in the mail If you have any lab test that is abnormal or we need to change your treatment, we will call you to review the results.  Testing/Procedures: None  Follow-Up: At [facility], you and your health needs are our priority.  As part of our continuing mission to provide you with exceptional heart care, we have created designated Provider Care Teams.  These Care Teams include your primary Cardiologist (physician) and Advanced Practice Providers (APPs -  Physician Assistants and Nurse Practitioners) who all work together to provide you with the care you need, when you need it. You will need a follow up appointment in 4 months.  Please call our office 2 months in advance to schedule this appointment.  You may see No primary care provider on file. or one of the following Advanced Practice Providers on your designated Care Team:   Kerin Ransom, PA-C [place], [place] . Sande Rives, PA-C  Any Other Special Instructions Will Be Listed Below (If Applicable).

## 2018-12-09 NOTE — Progress Notes (Signed)
Virtual Visit via Telephone Note    Evaluation Performed:  Follow-up visit  This visit type was conducted due to national recommendations for restrictions regarding the COVID-19 Pandemic (e.g. social distancing).  This format is felt to be most appropriate for this patient at this time.  All issues noted in this document were discussed and addressed.  No physical exam was performed (except for noted visual exam findings with Video Visits).  Please refer to the patient's chart (MyChart message for video visits and phone note for telephone visits) for the patient's consent to telehealth for [facility]  Date:  12/09/2018   ID:  Amy Bruce, DOB [birth year], MRN [id number]  Patient Location:    Provider location:   [place]  PCP:  London Pepper, MD  Cardiologist:  No primary care provider on file.  Electrophysiologist:  None   Chief Complaint:  Follow up for PAD  History of Present Illness:    Amy Bruce is a 68 y.o. female who presents via audio/video conferencing for a telehealth visit today.  She has known history of peripheral arterial disease, tobacco use, essential hypertension and hyperlipidemia.  Previous CT imaging showed evidence of aortic and coronary calcifications.  She has prolonged history of plantar fasciitis. She has known history of peripheral arterial disease with moderately reduced ABI on the right at 0.54 and mild on the left at 0.81. Angiography in July 2019 showed diffuse right SFA disease with medium length occlusion in the mid to distal segment.  On the left there was moderate ostial left SFA stenosis with two-vessel runoff below the knee.  She was referred to vascular surgery to consider right femoral-popliteal bypass but medical therapy was pursued after.  She has been on cilostazol. Lexiscan Myoview in September 2019 showed no evidence of ischemia with normal ejection fraction. She is cutting down on tobacco use and she is down to 2 cigarettes  a day. She has been doing reasonably well overall with no chest pain or shortness of breath.  She was hospitalized in December with pyelonephritis.  Lisinopril was discontinued at that time. She complains of discomfort in the lateral side of right thigh with some back discomfort.  She does have disc herniation and will be seeing neurosurgery in the near future.  She also reports that her blood pressure continues to be on the low side around 338 systolic with occasional elevated heart rate at 105 to 110 bpm.  She denies palpitations.  The patient does not symptoms concerning for COVID-19 infection (fever, chills, cough, or new shortness of breath).    Prior CV studies:   The following studies were reviewed today:    Past Medical History:  Diagnosis Date  . Anemia    in the past  . Anxiety   . Arthritis   . 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 Fibromyalgia   . GERD (gastroesophageal reflux disease)   . Heart murmur    due to rheumatic fever, PCP no longer hears it  . Hyperlipidemia   . Hypertension   . Myalgia   . Neuropathy   . PAD (peripheral artery disease) ([place])   . PAD (peripheral artery disease) ([place])   . Pre-diabetes   . Pyelonephritis 09/10/2019    "septic"  . Tobacco abuse   . 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 ABDOMINAL AORTOGRAM W/LOWER EXTREMITY N/A 04/02/2018  Procedure: ABDOMINAL AORTOGRAM W/LOWER EXTREMITY;  Surgeon: Wellington Hampshire, MD;  Location: [facility];  Service: Cardiovascular;  Laterality: N/A;  . ABDOMINAL HYSTERECTOMY  1999  . [place]   C5/6 6/7  . COLONOSCOPY    . ECTOPIC PREGNANCY SURGERY    . Left axillary cyst removal 1989    . MULTIPLE EXTRACTIONS WITH ALVEOLOPLASTY N/A 10/25/2017   Procedure: EXTRACTION NUMBERS FIVE, SEVEN, EIGHT, NINE, TEN, FIFTEEN, EIGHTEEN WITH ALVEOLOPLASTY, IRRIGATION AND DEBRIDEMENT LEFT  SUBMANDIBULAR INFECTION;  Surgeon: Diona Browner, DDS;  Location: [facility];  Service: Oral Surgery;  Laterality: N/A;  . RADIOLOGY WITH ANESTHESIA N/A 11/18/2018   Procedure: MRI OF THE CERVICAL SPINE WITHOUT CONTRAST;  Surgeon: Radiologist, Medication, MD;  Location: [facility];  Service: Radiology;  Laterality: N/A;  . TONSILECTOMY, ADENOIDECTOMY, BILATERAL MYRINGOTOMY AND TUBES  1981   put not aware of tubes being put in ears  . TUBAL LIGATION  1981     No outpatient medications have been marked as taking for the 12/09/18 encounter (Appointment) with Wellington Hampshire, MD.     Allergies:   Carisoprodol and Duloxetine hcl   Social History   Tobacco Use  . Smoking status: Current Every Day Smoker    Packs/day: 1.00    Years: 33.00    Pack years: 33.00    Types: Cigarettes  . Smokeless tobacco: Never Used  . Tobacco comment: 2 cigarettes per day  Substance Use Topics  . Alcohol use: Yes    Alcohol/week: 2.0 standard drinks    Types: 2 Shots of liquor per week    Comment: 1-2 wk  . Drug use: No     Family Hx: The patient's family history includes Ovarian cancer in her mother.  ROS:   Please see the history of present illness.     All other systems reviewed and are negative.   Labs/Other Tests and Data Reviewed:    Recent Labs: 09/09/2018: ALT 22 11/18/2018: BUN 14; Creatinine, Ser 1.05; Hemoglobin 11.6; Platelets 292; Potassium 3.5; Sodium 138   Recent Lipid Panel No results found for: CHOL, TRIG, HDL, CHOLHDL, LDLCALC, LDLDIRECT  Wt Readings from Last 3 Encounters:  11/20/18 166 lb 6.4 oz (75.5 kg)  11/18/18 160 lb (72.6 kg)  10/17/18 162 lb (73.5 kg)     Objective:    Vital Signs:  There were no vitals taken for this visit.     ASSESSMENT & PLAN:    1.  Peripheral arterial disease: Patient has claudication worse on the right side.  For now continue medical therapy with aspirin and cilostazol.  She seems to be mostly limited by right thigh discomfort and numbness  which I do not think is vascular in origin.  She also has significant plantar fasciitis.  She was told that no intervention will be done in her foot without addressing her vascular situation.  I again explained to her that endovascular intervention can be considered in the future based on her progress.  2.  Tobacco use: She cut down to 2 cigarettes a day and I discussed with her the importance of complete cessation.  3.  Hyperlipidemia: Continue atorvastatin with a target LDL of less than 70.  4.  Mild tachycardia: She noticed that when she checks her blood pressure but overall she is asymptomatic.  Consider checking thyroid function.  If an antihypertensive medication is needed , carvedilol might be a reasonable option for situation.  COVID-19 Education: The signs and symptoms of COVID-19  were discussed with the patient and how to seek care for testing (follow up with PCP or arrange E-visit).  The importance of social distancing was discussed today.  Patient Risk:   After full review of this patient's clinical status, I feel that they are at least moderate risk at this time.  Time:   Today, I have spent 22 minutes with the patient with telehealth technology discussing .     Medication Adjustments/Labs and Tests Ordered: Current medicines are reviewed at length with the patient today.  Concerns regarding medicines are outlined above.  Tests Ordered: No orders of the defined types were placed in this encounter.  Medication Changes: No orders of the defined types were placed in this encounter.   Disposition:  Follow up in 4 month(s)  Signed, Kathlyn Sacramento, MD  12/09/2018 8:25 AM    [facility]

## 2018-12-12 IMAGING — CT CT CHEST LUNG CANCER SCREENING LOW DOSE W/O CM
2 of 4 series · 15 of 40 positions shown, 18 images · non-contrast
Comparison: None.

CLINICAL DATA: Current smoker, 33 pack-year history, lung cancer
screening.

EXAM:
CT CHEST WITHOUT CONTRAST LOW-DOSE FOR LUNG CANCER SCREENING
TECHNIQUE: Multidetector CT imaging of the chest was performed following the
standard protocol without IV contrast.

[Series 2: thorax 5.0 i31f 3 · axial · 0.68mm/px · z∈[-319,-54]mm · 12 of 63 slices shown, 15 images]
[im 5/63  mediastinal]
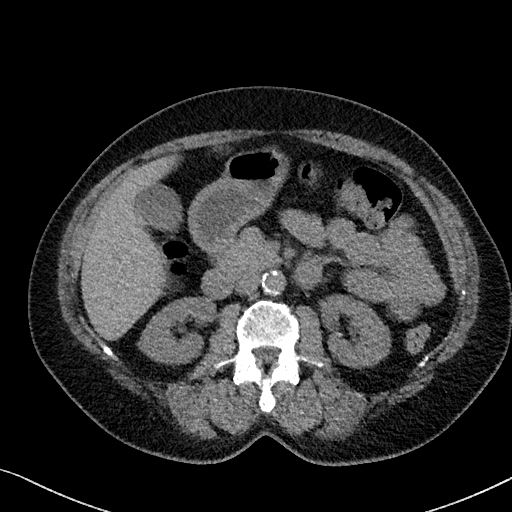
[im 5/63  lung]
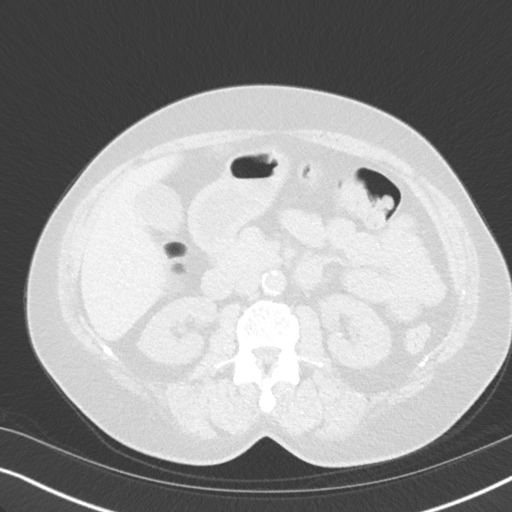
[im 10/63  lung]
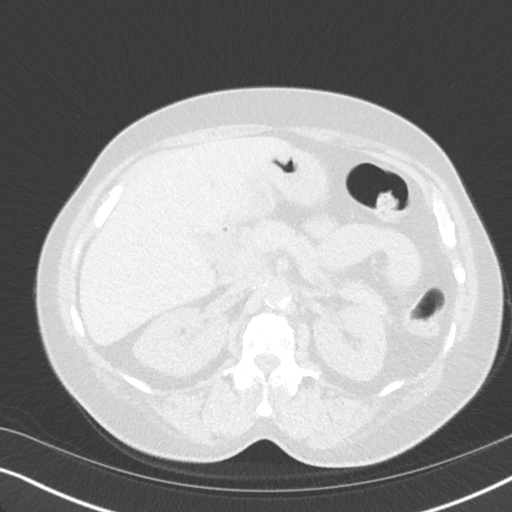
[im 15/63  lung]
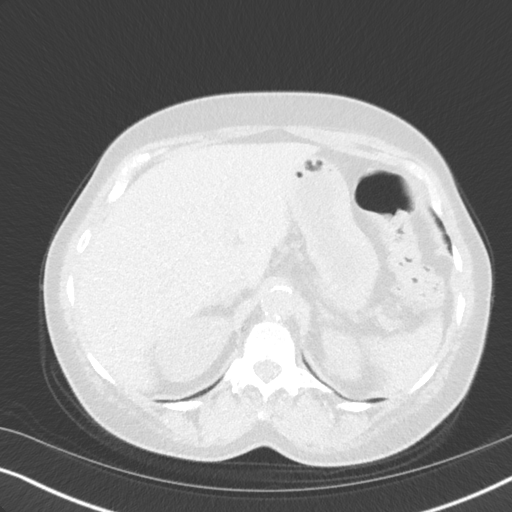
[im 20/63  lung]
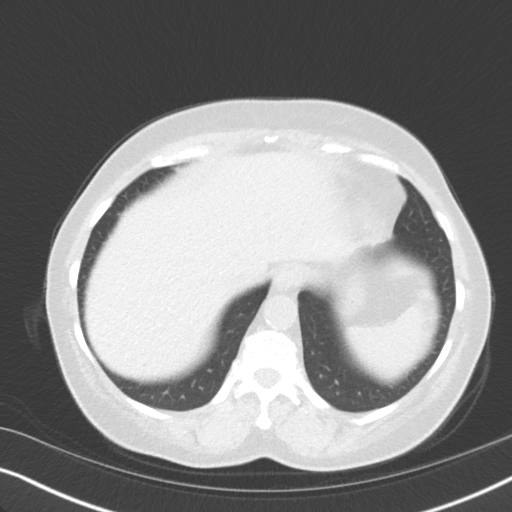
[im 24/63  mediastinal]
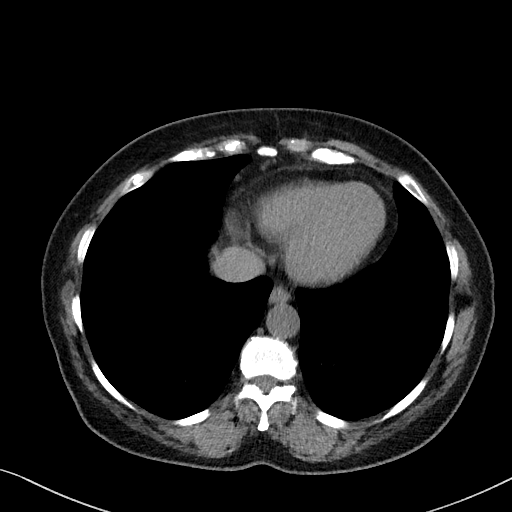
[im 24/63  lung]
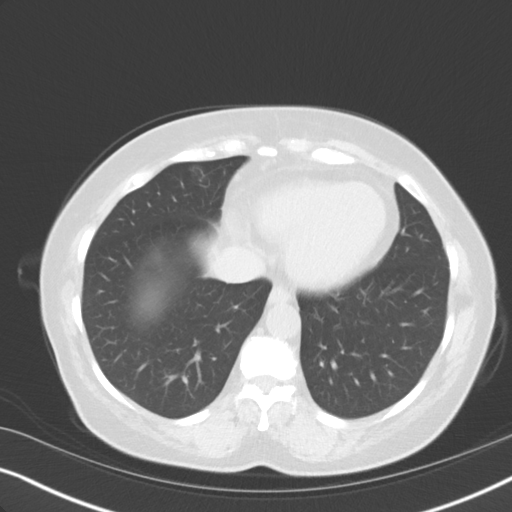
[im 29/63  lung]
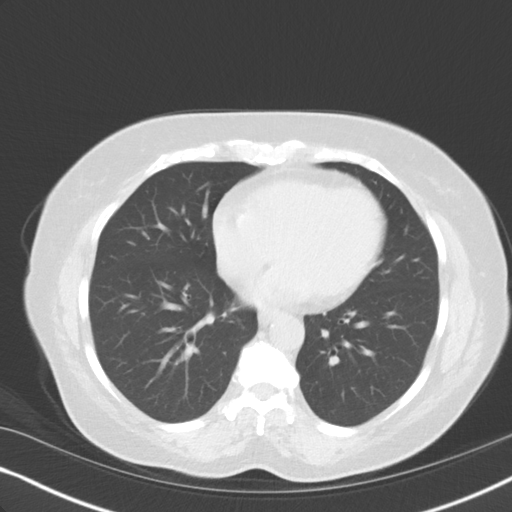
[im 34/63  lung]
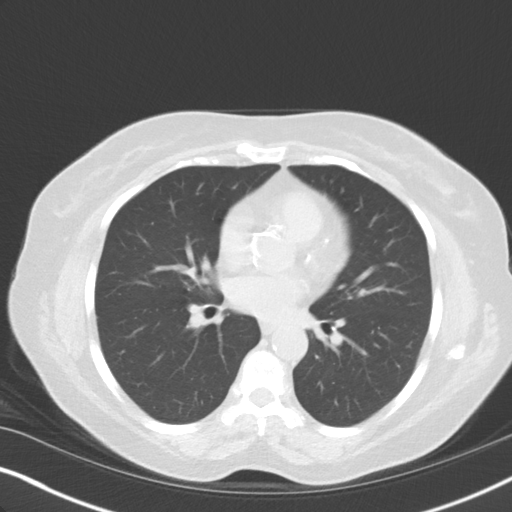
[im 39/63  lung]
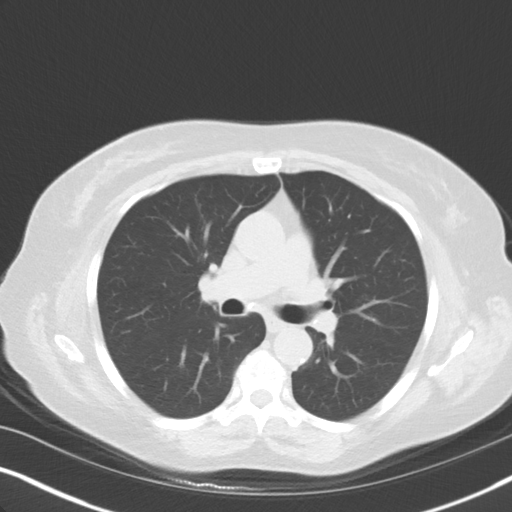
[im 43/63  mediastinal]
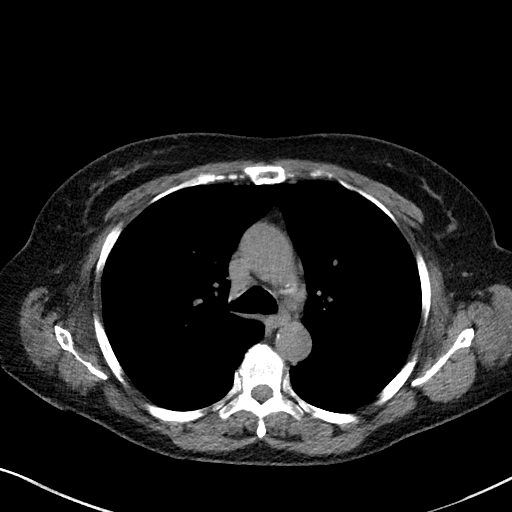
[im 43/63  lung]
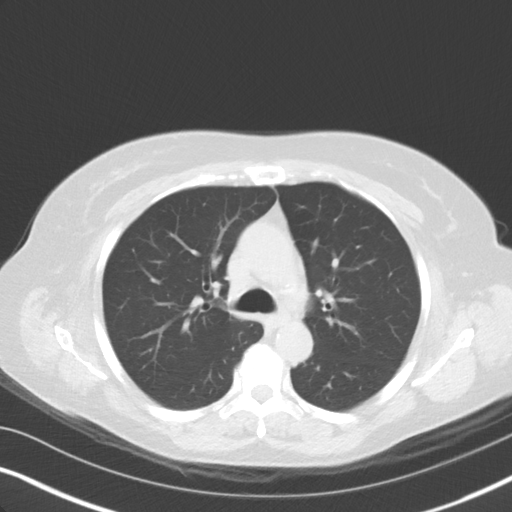
[im 48/63  lung]
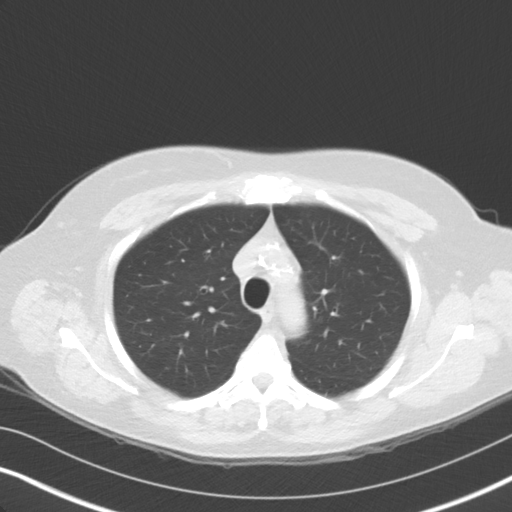
[im 53/63  lung]
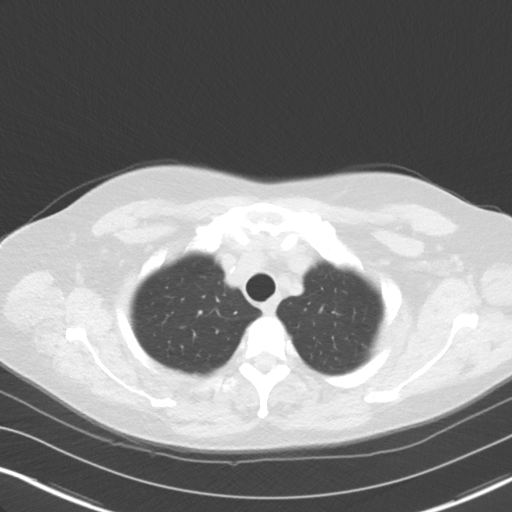
[im 58/63  lung]
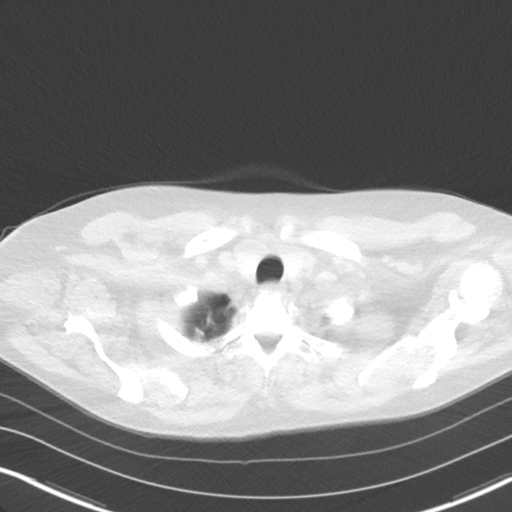

[Series 5: coronal · coronal · 0.59mm/px · 3 of 120 slices shown]
[im 24/120  lung]
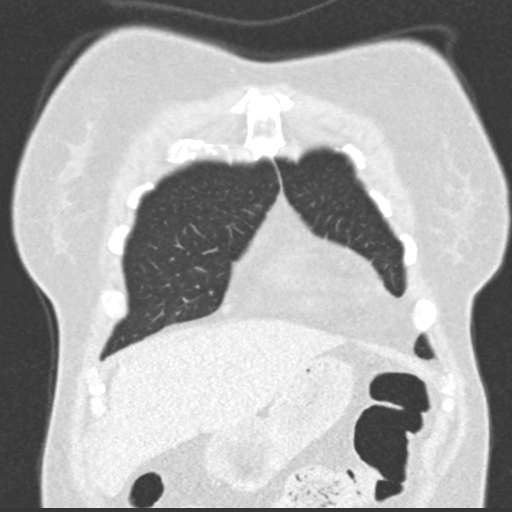
[im 48/120  lung]
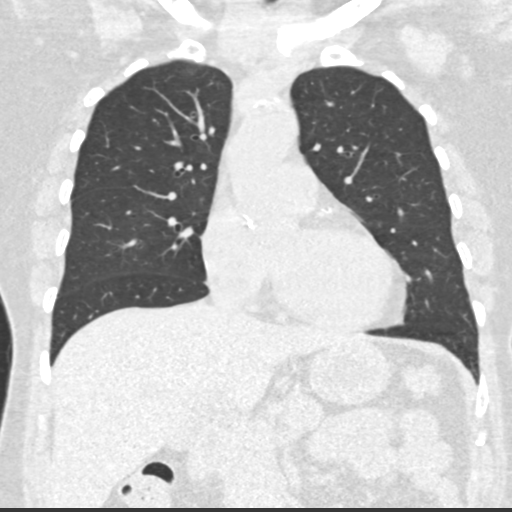
[im 72/120  lung]
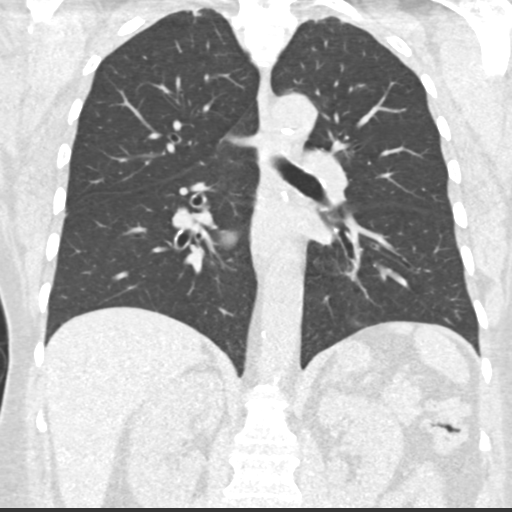

[15 of 40 positions shown; findings below may reference images not displayed]

FINDINGS: Cardiovascular: Atherosclerotic calcification of the arterial
vasculature, including coronary arteries. Heart size normal. No
pericardial effusion.

Mediastinum/Nodes: There are calcified mediastinal lymph nodes.
Hilar regions are difficult to evaluate without IV contrast. No
axillary adenopathy.

Lungs/Pleura: Biapical pleuroparenchymal scarring. No worrisome
pulmonary nodules. No pleural fluid. Minimal debris is seen
dependently in the upper trachea.

Upper Abdomen: Visualized portions of the liver, gallbladder,
adrenal glands, kidneys, spleen, pancreas, stomach and bowel are
grossly unremarkable.

Musculoskeletal: No worrisome lytic or sclerotic lesions.
Degenerative changes are seen in the spine.
IMPRESSION: 1. Lung-RADS Category 1, negative. Continue annual screening with
low-dose chest CT without contrast in 12 months.
2. Aortic atherosclerosis (M2SAW-170.0). Coronary artery
calcification.

## 2018-12-18 ENCOUNTER — Other Ambulatory Visit: Payer: Self-pay

## 2018-12-18 ENCOUNTER — Encounter: Payer: Self-pay | Admitting: Physical Medicine & Rehabilitation

## 2018-12-18 ENCOUNTER — Encounter: Payer: Medicare Other | Attending: Physical Medicine & Rehabilitation | Admitting: Physical Medicine & Rehabilitation

## 2018-12-18 DIAGNOSIS — I739 Peripheral vascular disease, unspecified: Secondary | ICD-10-CM

## 2018-12-18 DIAGNOSIS — M791 Myalgia, unspecified site: Secondary | ICD-10-CM

## 2018-12-18 DIAGNOSIS — G894 Chronic pain syndrome: Secondary | ICD-10-CM | POA: Diagnosis not present

## 2018-12-18 DIAGNOSIS — M542 Cervicalgia: Secondary | ICD-10-CM

## 2018-12-18 DIAGNOSIS — M898X1 Other specified disorders of bone, shoulder: Secondary | ICD-10-CM

## 2018-12-18 DIAGNOSIS — R200 Anesthesia of skin: Secondary | ICD-10-CM

## 2018-12-18 DIAGNOSIS — M47812 Spondylosis without myelopathy or radiculopathy, cervical region: Secondary | ICD-10-CM

## 2018-12-18 DIAGNOSIS — M5412 Radiculopathy, cervical region: Secondary | ICD-10-CM | POA: Insufficient documentation

## 2018-12-18 DIAGNOSIS — F17200 Nicotine dependence, unspecified, uncomplicated: Secondary | ICD-10-CM

## 2018-12-18 DIAGNOSIS — Z720 Tobacco use: Secondary | ICD-10-CM

## 2018-12-18 DIAGNOSIS — M792 Neuralgia and neuritis, unspecified: Secondary | ICD-10-CM

## 2018-12-18 DIAGNOSIS — M25511 Pain in right shoulder: Secondary | ICD-10-CM | POA: Insufficient documentation

## 2018-12-18 DIAGNOSIS — M4802 Spinal stenosis, cervical region: Secondary | ICD-10-CM

## 2018-12-18 DIAGNOSIS — M722 Plantar fascial fibromatosis: Secondary | ICD-10-CM

## 2018-12-18 DIAGNOSIS — G8929 Other chronic pain: Secondary | ICD-10-CM | POA: Insufficient documentation

## 2018-12-18 DIAGNOSIS — R202 Paresthesia of skin: Secondary | ICD-10-CM

## 2018-12-18 MED ORDER — GABAPENTIN 600 MG PO TABS: 1200.0000 mg | ORAL_TABLET | Freq: Three times a day (TID) | ORAL | 1 refills | Status: DC

## 2018-12-18 NOTE — Addendum Note (Signed)
Addended by: Delice Lesch A on: 12/18/2018 04:03 PM   Modules accepted: Orders

## 2018-12-18 NOTE — Progress Notes (Signed)
Subjective:    Patient ID: Amy Bruce, female    DOB: 09-22-1950, 68 y.o.   MRN: [id number]  TELEHEALTH NOTE  Due to national recommendations of social distancing due to COVID 19, an audio/video telehealth visit is felt to be most appropriate for this patient at this time.  See Chart message from today for the patient's consent to telehealth from [place].     I verified that I am speaking with the correct person using two identifiers.  Location of patient: Home Location of provider: Office Method of communication: Telephone Names of participants : Zorita Pang scheduling, [employer] obtaining consent and vitals if available Established patient Time spent on call: 12 min  HPI  68 y/o female with pmh/psh ofHTN, anemia, B12 deficiency, rheumatic fever, cervical fusion present for follow up for general pain.   Initially stated: Present since fusion 1991, getting worse in last couple weeks.  She fractured her humerus in 2016 and states she was slinged.  No alleviating factors. Everything exacerbates the pain.  Sharp pain.  Radiates to hands.  She has associated numbness.  Ibu does not help. Narco, flexaril helps. Denies falls. Pain limits from doing ADLs. Pt notes she had similar symptoms on left side before first fusion. Pt relocated 12/2014 from [place], [place] where she was being followed by Pain clinic.    Last clinic visit 11/20/2018.  Since that time patient states, she did not notice difference with medrol dose pack.  She has not heard from Neurosurg. She continues to smoke 2/day. She continues to have pain due to plantar fascitis. She is awaiting stent placement in her LE. BP have been controlled. She did not notice benefit with increase in Gabapentin.   Pain Inventory Average Pain 9 Pain Right Now 9 My pain is constant, sharp, burning, tingling, aching and throbbing  In the last 24 hours, has pain interfered with the following? General  activity 7 Relation with others 0 Enjoyment of life 5 What TIME of day is your pain at its worst? all Sleep (in general) Good  Pain is worse with: sitting, inactivity and some activites Pain improves with: sleep only Relief from Meds: 0  Mobility walk without assistance ability to climb steps?  yes do you drive?  yes  Function retired  Neuro/Psych numbness tingling  Prior Studies Any changes since last visit?  no  Physicians involved in your care Any changes since last visit?  no   No pertinent family history of neck/shoulder pain. Social History   Socioeconomic History  . Marital status: Divorced    Spouse name: Not on file  . Number of children: Not on file  . Years of education: Not on file  . Highest education level: Not on file  Occupational History  . Not on file  Social Needs  . Financial resource strain: Not on file  . Food insecurity:    Worry: Not on file    Inability: Not on file  . Transportation needs:    Medical: Not on file    Non-medical: Not on file  Tobacco Use  . Smoking status: Current Every Day Smoker    Packs/day: 1.00    Years: 33.00    Pack years: 33.00    Types: Cigarettes  . Smokeless tobacco: Never Used  . Tobacco comment: 2 cigarettes per day  Substance and Sexual Activity  . Alcohol use: Yes    Alcohol/week: 2.0 standard drinks    Types: 2 Shots  of liquor per week    Comment: 1-2 wk  . Drug use: No  . Sexual activity: Not on file  Lifestyle  . Physical activity:    Days per week: Not on file    Minutes per session: Not on file  . Stress: Not on file  Relationships  . Social connections:    Talks on phone: Not on file    Gets together: Not on file    Attends religious service: Not on file    Active member of club or organization: Not on file    Attends meetings of clubs or organizations: Not on file    Relationship status: Not on file  Other Topics Concern  . Not on file  Social History Narrative  . Not on file    Past surgical history: Cervical fusion. Past medical history: HTN, anemia, B12 deficiency, rheumatic fever, chronic pain There were no vitals taken for this visit.  Opioid Risk Score:   Fall Risk Score:  `1  Depression screen PHQ 2/9  Depression screen [facility] 2/9 11/20/2018 12/12/2016 11/08/2016 10/11/2016  Decreased Interest 0 0 0 0  Down, Depressed, Hopeless 0 0 0 0  PHQ - 2 Score 0 0 0 0  Altered sleeping - - - 0  Tired, decreased energy - - - 0  Change in appetite - - - 0  Feeling bad or failure about yourself  - - - 0  Trouble concentrating - - - 0  Moving slowly or fidgety/restless - - - 0  Suicidal thoughts - - - 0  PHQ-9 Score - - - 0  Difficult doing work/chores - - - Not difficult at all   Review of Systems  Constitutional: Negative.        Weight gain in relatively short amount of time  HENT: Negative.   Eyes: Negative.   Respiratory: Negative.   Cardiovascular: Negative.   Gastrointestinal: Negative.   Endocrine: Negative.   Genitourinary: Negative.   Musculoskeletal: Positive for arthralgias, back pain, myalgias and neck pain.       Spasms  Skin: Negative.   Allergic/Immunologic: Negative.   Neurological: Positive for numbness.       Tingling   Hematological: Negative.   Psychiatric/Behavioral: Negative.   All other systems reviewed and are negative.     Objective:   Physical Exam Gen: NAD. Pulm: Effort normal Neuro: Alert and oriented    Assessment & Plan:  68 y/o female with pmh/psh of  HTN, anemia, B12 deficiency, rheumatic fever, cervical fusion present for follow up of general pain.    1. 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  Per report 1. 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 Severe right  and moderately severe left foraminal narrowing are also seen at this level. 2. 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 Mild to moderate foraminal narrowing at this level is worse on the right. 3. Facet arthropathy and uncovertebral disease at C7-T1 cause severe left and moderately severe right foraminal narrowing. The central canal is patent. 4. Status post C5-7 fusion.  The central canal and foramina are open.  Previous NCS/EMG showing subacute/chronic C5 radic.   No benefit with Medrol dose pack  Heat/Cold, Baclofen, Lidoderm patch, Mobic ineffective, unable to tolerate  Cymbalta  Xray reviewed, showing mild chronic changes  Cont HEP, completed PT  Cont TENS  Will increase Gabapentin 1200 TID  Cont diclofenac 50 TID, may continue per Vascular   Cont Flexaril 10 TID   Neurosurgery referral made, has not received appointment   2. Myalgia and muscle spasms  Will consider Trigger point injections in future, does not want at present.    See #1  3. Right CTS  See #1  Cont brace qhs  4. Tobacco abuse  Smoking 2 cig/day still, encouraged abstinence   Cont to follow up with PCP for gum/meds/etc  Encourage cessation  5. Hx of post herpatic neuralgia  See #1  6. PVD  Limiting mobility  B/l LE  Cont follow up with Vascular - plan for stenting in future  8. Plantar fascitis  Cont follow up with podiatry  Encourage stretching, frozen bottle, bracing  Cont TENS

## 2019-01-08 ENCOUNTER — Other Ambulatory Visit: Payer: Self-pay | Admitting: Physical Medicine & Rehabilitation

## 2019-01-15 ENCOUNTER — Encounter: Payer: Medicare Other | Attending: Physical Medicine & Rehabilitation | Admitting: Physical Medicine & Rehabilitation

## 2019-01-15 ENCOUNTER — Other Ambulatory Visit: Payer: Self-pay

## 2019-01-15 ENCOUNTER — Encounter: Payer: Self-pay | Admitting: Physical Medicine & Rehabilitation

## 2019-01-15 VITALS — BP 131/70 | HR 111 | Ht 60.0 in | Wt 169.8 lb

## 2019-01-15 DIAGNOSIS — G894 Chronic pain syndrome: Secondary | ICD-10-CM

## 2019-01-15 DIAGNOSIS — M898X1 Other specified disorders of bone, shoulder: Secondary | ICD-10-CM

## 2019-01-15 DIAGNOSIS — G8929 Other chronic pain: Secondary | ICD-10-CM

## 2019-01-15 DIAGNOSIS — M5412 Radiculopathy, cervical region: Secondary | ICD-10-CM | POA: Insufficient documentation

## 2019-01-15 DIAGNOSIS — F17200 Nicotine dependence, unspecified, uncomplicated: Secondary | ICD-10-CM

## 2019-01-15 DIAGNOSIS — R200 Anesthesia of skin: Secondary | ICD-10-CM

## 2019-01-15 DIAGNOSIS — M47812 Spondylosis without myelopathy or radiculopathy, cervical region: Secondary | ICD-10-CM

## 2019-01-15 DIAGNOSIS — R202 Paresthesia of skin: Secondary | ICD-10-CM

## 2019-01-15 DIAGNOSIS — M25511 Pain in right shoulder: Secondary | ICD-10-CM | POA: Insufficient documentation

## 2019-01-15 DIAGNOSIS — M542 Cervicalgia: Secondary | ICD-10-CM

## 2019-01-15 DIAGNOSIS — Z720 Tobacco use: Secondary | ICD-10-CM

## 2019-01-15 DIAGNOSIS — M4802 Spinal stenosis, cervical region: Secondary | ICD-10-CM

## 2019-01-15 DIAGNOSIS — M791 Myalgia, unspecified site: Secondary | ICD-10-CM

## 2019-01-15 DIAGNOSIS — M792 Neuralgia and neuritis, unspecified: Secondary | ICD-10-CM

## 2019-01-15 MED ORDER — AMITRIPTYLINE HCL 10 MG PO TABS: 10.0000 mg | ORAL_TABLET | Freq: Every day | ORAL | 1 refills | Status: DC

## 2019-01-15 NOTE — Progress Notes (Addendum)
Subjective:    Patient ID: Amy Bruce, female    DOB: [birth year], 68 y.o.   MRN: [id number]  TELEHEALTH NOTE  Due to national recommendations of social distancing due to COVID 19, an audio/video telehealth visit is felt to be most appropriate for this patient at this time.  See Chart message from today for the patient's consent to telehealth from [place].     I verified that I am speaking with the correct person using two identifiers.  Location of patient: Home Location of provider: Office Method of communication: Telephone Names of participants : Amy Bruce scheduling, Amy Bruce obtaining consent and vitals if available Established patient Time spent on call: 20 minutes  HPI  Female with pmh/psh ofHTN, anemia, B12 deficiency, rheumatic fever, cervical fusion present for follow up for general pain.   Initially stated: Present since fusion 1991, getting worse in last couple weeks.  She fractured her humerus in 2016 and states she was slinged.  No alleviating factors. Everything exacerbates the pain.  Sharp pain.  Radiates to hands.  She has associated numbness.  Ibu does not help. Narco, flexaril helps. Denies falls. Pain limits from doing ADLs. Pt notes she had similar symptoms on left side before first fusion. Pt relocated 12/2014 from [place], [place] where she was being followed by Pain clinic.    Last clinic visit 12/18/2018.  Since that time, Gabapentin with good benefit. She notes pain in LUE now. Flexaril with benefit. She notes twitching in her RUE as well. Patient is to follow up for PVD after cervical issues. She has not followed up with Neurosurg. She is still smoking 2/cig/day. She complains of focal pruritis as well.   Pain Inventory Average Pain 9 Pain Right Now 7 My pain is constant, sharp, burning, tingling, aching and throbbing  In the last 24 hours, has pain interfered with the following? General activity 7 Relation with  others 0 Enjoyment of life 5 What TIME of day is your pain at its worst? all Sleep (in general) Good  Pain is worse with: sitting, inactivity and some activites Pain improves with: rest Relief from Meds: 0  Mobility walk without assistance ability to climb steps?  yes do you drive?  yes  Function retired  Neuro/Psych numbness tingling  Prior Studies Any changes since last visit?  no  Physicians involved in your care Any changes since last visit?  no   No pertinent family history of neck/shoulder pain. Social History   Socioeconomic History  . Marital status: Divorced    Spouse name: Not on file  . Number of children: Not on file  . Years of education: Not on file  . Highest education level: Not on file  Occupational History  . Not on file  Social Needs  . Financial resource strain: Not on file  . Food insecurity:    Worry: Not on file    Inability: Not on file  . Transportation needs:    Medical: Not on file    Non-medical: Not on file  Tobacco Use  . Smoking status: Current Every Day Smoker    Packs/day: 1.00    Years: 33.00    Pack years: 33.00    Types: Cigarettes  . Smokeless tobacco: Never Used  . Tobacco comment: 2 cigarettes per day  Substance and Sexual Activity  . Alcohol use: Yes    Alcohol/week: 2.0 standard drinks    Types: 2 Shots of liquor per week  Comment: 1-2 wk  . Drug use: No  . Sexual activity: Not on file  Lifestyle  . Physical activity:    Days per week: Not on file    Minutes per session: Not on file  . Stress: Not on file  Relationships  . Social connections:    Talks on phone: Not on file    Gets together: Not on file    Attends religious service: Not on file    Active member of club or organization: Not on file    Attends meetings of clubs or organizations: Not on file    Relationship status: Not on file  Other Topics Concern  . Not on file  Social History Narrative  . Not on file   Past surgical history:  Cervical fusion. Past medical history: HTN, anemia, B12 deficiency, rheumatic fever, chronic pain Ht 5' (1.524 m)   Wt 169 lb 12.8 oz (77 kg)   BMI 33.16 kg/m   Opioid Risk Score:   Fall Risk Score:  `1  Depression screen PHQ 2/9  Depression screen [facility] 2/9 11/20/2018 12/12/2016 11/08/2016 10/11/2016  Decreased Interest 0 0 0 0  Down, Depressed, Hopeless 0 0 0 0  PHQ - 2 Score 0 0 0 0  Altered sleeping - - - 0  Tired, decreased energy - - - 0  Change in appetite - - - 0  Feeling bad or failure about yourself  - - - 0  Trouble concentrating - - - 0  Moving slowly or fidgety/restless - - - 0  Suicidal thoughts - - - 0  PHQ-9 Score - - - 0  Difficult doing work/chores - - - Not difficult at all   Review of Systems  Constitutional: Negative.        Weight gain in relatively short amount of time  HENT: Negative.   Eyes: Negative.   Respiratory: Negative.   Cardiovascular: Negative.   Gastrointestinal: Negative.   Endocrine: Negative.   Genitourinary: Negative.   Musculoskeletal: Positive for arthralgias, back pain, myalgias and neck pain.       Spasms  Skin:       Pruritis  Allergic/Immunologic: Negative.   Neurological: Positive for numbness.       Tingling   Hematological: Negative.   Psychiatric/Behavioral: Negative.   All other systems reviewed and are negative.     Objective:   Physical Exam Gen: NAD. Pulm: Effort normal Neuro: Alert and oriented    Assessment & Plan:  Female with pmh/psh of  HTN, anemia, B12 deficiency, rheumatic fever, cervical fusion present for follow up of general pain.    1. 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  Per report canal stenosis as well as cord compression at C4-C5.  Per report 1. 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 Severe right and moderately severe left foraminal narrowing are also seen at this level. 2. 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 Mild to moderate foraminal narrowing at this level is worse on the right. 3. Facet arthropathy and uncovertebral disease at C7-T1 cause severe left and moderately severe right foraminal narrowing. The central canal is patent. 4. Status post C5-7 fusion.  The central canal and foramina are open.  Previous NCS/EMG showing subacute/chronic C5 radic.   No benefit  with Medrol dose pack  Heat/Cold, Baclofen, Lidoderm patch, Mobic ineffective, unable to tolerate Cymbalta  Xray reviewed, showing mild chronic changes  Cont HEP, completed PT  Cont TENS  Cont Gabapentin 1200 TID  Cont diclofenac 50 TID, may continue per Vascular   Cont Flexaril 10 TID   Will order Elavil 10 qhs  Neurosurgery referral made, has not received appointment, encouraged follow up-patient now with left-sided symptoms as well progressing right-sided symptoms   2. Myalgia and muscle spasms  Will consider Trigger point injections in future, does not want at present.    See #1  3. Right CTS  See #1  Cont brace qhs  4. Tobacco abuse  Smoking 2 cig/day still, encouraged abstinence again  Cont to follow up with PCP for gum/meds/etc  Encourage cessation  5. Hx of post herpatic neuralgia  See #1  6. PVD  Limiting mobility  B/l LE  Cont follow up with Vascular - plan for stenting in future  8. Plantar fascitis  Cont follow up with podiatry  Encourage stretching, frozen bottle, bracing  Cont TENS

## 2019-02-12 ENCOUNTER — Encounter: Payer: Self-pay | Admitting: Physical Medicine & Rehabilitation

## 2019-02-12 ENCOUNTER — Encounter: Payer: Medicare Other | Attending: Physical Medicine & Rehabilitation | Admitting: Physical Medicine & Rehabilitation

## 2019-02-12 ENCOUNTER — Other Ambulatory Visit: Payer: Self-pay

## 2019-02-12 ENCOUNTER — Ambulatory Visit: Payer: Medicare Other | Admitting: Physical Medicine & Rehabilitation

## 2019-02-12 VITALS — BP 129/78 | HR 96 | Temp 98.4°F | Ht 60.0 in | Wt 172.6 lb

## 2019-02-12 DIAGNOSIS — F17200 Nicotine dependence, unspecified, uncomplicated: Secondary | ICD-10-CM

## 2019-02-12 DIAGNOSIS — M791 Myalgia, unspecified site: Secondary | ICD-10-CM

## 2019-02-12 DIAGNOSIS — G894 Chronic pain syndrome: Secondary | ICD-10-CM | POA: Insufficient documentation

## 2019-02-12 DIAGNOSIS — M4802 Spinal stenosis, cervical region: Secondary | ICD-10-CM

## 2019-02-12 DIAGNOSIS — M25511 Pain in right shoulder: Secondary | ICD-10-CM | POA: Insufficient documentation

## 2019-02-12 DIAGNOSIS — Z720 Tobacco use: Secondary | ICD-10-CM

## 2019-02-12 DIAGNOSIS — R200 Anesthesia of skin: Secondary | ICD-10-CM

## 2019-02-12 DIAGNOSIS — M792 Neuralgia and neuritis, unspecified: Secondary | ICD-10-CM

## 2019-02-12 DIAGNOSIS — M5412 Radiculopathy, cervical region: Secondary | ICD-10-CM | POA: Insufficient documentation

## 2019-02-12 DIAGNOSIS — G8929 Other chronic pain: Secondary | ICD-10-CM | POA: Insufficient documentation

## 2019-02-12 DIAGNOSIS — R202 Paresthesia of skin: Secondary | ICD-10-CM

## 2019-02-12 DIAGNOSIS — M47812 Spondylosis without myelopathy or radiculopathy, cervical region: Secondary | ICD-10-CM

## 2019-02-12 MED ORDER — AMITRIPTYLINE HCL 25 MG PO TABS: 25.0000 mg | ORAL_TABLET | Freq: Every day | ORAL | 1 refills | Status: DC

## 2019-02-12 NOTE — Progress Notes (Signed)
Subjective:    Patient ID: Amy Bruce, female    DOB: 02-28-1951, 68 y.o.   MRN: [id number]  TELEHEALTH NOTE  Due to national recommendations of social distancing due to COVID 19, an audio/video telehealth visit is felt to be most appropriate for this patient at this time.  See Chart message from today for the patient's consent to telehealth from [facility].     I verified that I am speaking with the correct person using two identifiers.  Location of patient: Home Location of provider: Office Method of communication: Webex Names of participants : Zorita Pang scheduling, Marland Mcalpine obtaining consent and vitals if available Established patient Time spent on call: 25 minutes  HPI  Female with pmh/psh ofHTN, anemia, B12 deficiency, rheumatic fever, cervical fusion present for follow up for general pain.   Initially stated: Present since fusion 1991, getting worse in last couple weeks.  She fractured her humerus in 2016 and states she was slinged.  No alleviating factors. Everything exacerbates the pain.  Sharp pain.  Radiates to hands.  She has associated numbness.  Ibu does not help. Narco, flexaril helps. Denies falls. Pain limits from doing ADLs. Pt notes she had similar symptoms on left side before first fusion. Pt relocated 12/2014 from [place], [place] where she was being followed by Pain clinic.    Last clinic visit 01/15/2019.  Attempted to call patient this AM, unavailable, rescheduled for this afternoon. Since that time, she states some benefit with Elavil. She has not heard back from Neurosurg. She continues to take smoke. Overall, pain persists. She has had ankle pain and prescribed prednisone by PCP.  Pain Inventory Average Pain 9 Pain Right Now 7 My pain is constant, sharp, burning, tingling, aching and throbbing  In the last 24 hours, has pain interfered with the following? General activity 7 Relation with others 0 Enjoyment of life  5 What TIME of day is your pain at its worst? all Sleep (in general) Good  Pain is worse with: sitting, inactivity and some activites Pain improves with: rest Relief from Meds: 0  Mobility walk without assistance ability to climb steps?  yes do you drive?  yes  Function retired  Neuro/Psych numbness tingling  Prior Studies Any changes since last visit?  no  Physicians involved in your care Any changes since last visit?  no   No pertinent family history of neck/shoulder pain. Social History   Socioeconomic History  . Marital status: Divorced    Spouse name: Not on file  . Number of children: Not on file  . Years of education: Not on file  . Highest education level: Not on file  Occupational History  . Not on file  Social Needs  . Financial resource strain: Not on file  . Food insecurity:    Worry: Not on file    Inability: Not on file  . Transportation needs:    Medical: Not on file    Non-medical: Not on file  Tobacco Use  . Smoking status: Current Every Day Smoker    Packs/day: 1.00    Years: 33.00    Pack years: 33.00    Types: Cigarettes  . Smokeless tobacco: Never Used  . Tobacco comment: 2 cigarettes per day  Substance and Sexual Activity  . Alcohol use: Yes    Alcohol/week: 2.0 standard drinks    Types: 2 Shots of liquor per week    Comment: 1-2 wk  . Drug use: No  .  Sexual activity: Not on file  Lifestyle  . Physical activity:    Days per week: Not on file    Minutes per session: Not on file  . Stress: Not on file  Relationships  . Social connections:    Talks on phone: Not on file    Gets together: Not on file    Attends religious service: Not on file    Active member of club or organization: Not on file    Attends meetings of clubs or organizations: Not on file    Relationship status: Not on file  Other Topics Concern  . Not on file  Social History Narrative  . Not on file   Past surgical history: Cervical fusion. Past medical  history: HTN, anemia, B12 deficiency, rheumatic fever, chronic pain There were no vitals taken for this visit.  Opioid Risk Score:   Fall Risk Score:  `1  Depression screen PHQ 2/9  Depression screen [facility] 2/9 11/20/2018 12/12/2016 11/08/2016 10/11/2016  Decreased Interest 0 0 0 0  Down, Depressed, Hopeless 0 0 0 0  PHQ - 2 Score 0 0 0 0  Altered sleeping - - - 0  Tired, decreased energy - - - 0  Change in appetite - - - 0  Feeling bad or failure about yourself  - - - 0  Trouble concentrating - - - 0  Moving slowly or fidgety/restless - - - 0  Suicidal thoughts - - - 0  PHQ-9 Score - - - 0  Difficult doing work/chores - - - Not difficult at all   Review of Systems  Constitutional: Negative.        Weight gain in relatively short amount of time  HENT: Negative.   Eyes: Negative.   Respiratory: Negative.   Cardiovascular: Negative.   Gastrointestinal: Negative.   Endocrine: Negative.   Genitourinary: Negative.   Musculoskeletal: Positive for arthralgias, back pain, myalgias and neck pain.       Spasms  Skin:       Pruritis  Allergic/Immunologic: Negative.   Neurological: Positive for numbness.       Tingling   Hematological: Negative.   Psychiatric/Behavioral: Negative.   All other systems reviewed and are negative.     Objective:   Physical Exam Gen: NAD. Pulm: Effort normal Neuro: Alert and oriented    Assessment & Plan:  Female with pmh/psh of  HTN, anemia, B12 deficiency, rheumatic fever, cervical fusion present for follow up of general pain.    1. 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  Per report canal stenosis as well as cord compression at C4-C5.  Per report 1. 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 Severe right and moderately  severe left foraminal narrowing are also seen at this level. 2. 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 Mild to moderate foraminal narrowing at this level is worse on the right. 3. Facet arthropathy and uncovertebral disease at C7-T1 cause severe left and moderately severe right foraminal narrowing. The central canal is patent. 4. Status post C5-7 fusion.  The central canal and foramina are open.  Previous NCS/EMG showing subacute/chronic C5 radic.   No benefit with Medrol dose pack  Heat/Cold, Baclofen, Lidoderm patch, Mobic ineffective, unable to tolerate Cymbalta  Xray reviewed, showing mild chronic  changes  Cont HEP, completed PT  Cont TENS  Cont Gabapentin 1200 TID  Cont diclofenac 50 TID, may continue per Vascular   Cont Flexaril 10 TID   Will increase Elavil to 25 qhs  Neurosurgery referral made, has not received appointment, encouraged follow up-patient now with left-sided symptoms as well progressing right-sided symptoms, still awaiting appointment  Steroids ordered by PCP for ankle pain   2. Myalgia and muscle spasms  Will consider Trigger point injections in future, does not want at present.    See #1  3. Right CTS  See #1  Cont brace qhs  4. Tobacco abuse  Smoking 2 cig/day still, encouraged abstinence again  Cont to follow up with PCP for gum/meds/etc  Encourage cessation  5. Hx of post herpatic neuralgia  See #1  6. PVD  Limiting mobility  B/l LE  Cont follow up with Vascular - plan for stenting in future  8. Plantar fascitis  Cont follow up with podiatry  Encourage stretching, frozen bottle, bracing  Cont TENS

## 2019-02-17 ENCOUNTER — Other Ambulatory Visit: Payer: Self-pay | Admitting: Physical Medicine & Rehabilitation

## 2019-02-18 ENCOUNTER — Other Ambulatory Visit: Payer: Self-pay | Admitting: Physical Medicine & Rehabilitation

## 2019-03-12 ENCOUNTER — Ambulatory Visit (INDEPENDENT_AMBULATORY_CARE_PROVIDER_SITE_OTHER): Payer: Medicare Other

## 2019-03-12 ENCOUNTER — Ambulatory Visit (INDEPENDENT_AMBULATORY_CARE_PROVIDER_SITE_OTHER): Payer: Medicare Other | Admitting: Podiatry

## 2019-03-12 ENCOUNTER — Encounter: Payer: Medicare Other | Admitting: Physical Medicine & Rehabilitation

## 2019-03-12 ENCOUNTER — Encounter: Payer: Self-pay | Admitting: Podiatry

## 2019-03-12 ENCOUNTER — Other Ambulatory Visit: Payer: Self-pay

## 2019-03-12 DIAGNOSIS — M779 Enthesopathy, unspecified: Secondary | ICD-10-CM | POA: Diagnosis not present

## 2019-03-12 DIAGNOSIS — M7752 Other enthesopathy of left foot: Secondary | ICD-10-CM | POA: Diagnosis not present

## 2019-03-16 NOTE — Progress Notes (Signed)
Subjective:   Patient ID: Amy Bruce, female   DOB: 68 y.o.   MRN: [id number]   HPI Patient states her ankle is really been bothering her left and it has gotten inflamed recently   ROS      Objective:  Physical Exam  Neurovascular status intact with exquisite discomfort sinus tarsi left     Assessment:  Acute sinus tarsitis left     Plan:  Sterile prep and reinjected the sinus tarsi left 3 mg Kenalog 5 g Xylocaine advised on reduced activity for the next few days and reappoint to recheck

## 2019-03-18 ENCOUNTER — Encounter: Payer: Medicare Other | Attending: Physical Medicine & Rehabilitation | Admitting: Physical Medicine & Rehabilitation

## 2019-03-18 DIAGNOSIS — M25511 Pain in right shoulder: Secondary | ICD-10-CM | POA: Insufficient documentation

## 2019-03-18 DIAGNOSIS — G8929 Other chronic pain: Secondary | ICD-10-CM | POA: Insufficient documentation

## 2019-03-18 DIAGNOSIS — G894 Chronic pain syndrome: Secondary | ICD-10-CM | POA: Insufficient documentation

## 2019-03-18 DIAGNOSIS — M5412 Radiculopathy, cervical region: Secondary | ICD-10-CM | POA: Insufficient documentation

## 2019-04-06 ENCOUNTER — Telehealth: Payer: Self-pay | Admitting: *Deleted

## 2019-04-06 NOTE — Telephone Encounter (Signed)
Left a message to confirm the virtual appointment with Dr. Fletcher Anon tomorrow 04/06/2019

## 2019-04-07 ENCOUNTER — Telehealth: Payer: Self-pay

## 2019-04-07 ENCOUNTER — Encounter: Payer: Medicare Other | Admitting: Cardiovascular Disease

## 2019-04-07 ENCOUNTER — Other Ambulatory Visit: Payer: Self-pay

## 2019-04-07 NOTE — Telephone Encounter (Signed)
Left message for patient to call office back to prechart before 9:40 [facility] visit with Dr. Fletcher Anon

## 2019-04-07 NOTE — Telephone Encounter (Signed)
Left message for patient to call office back to prechart before virtual visit with Dr. Fletcher Anon

## 2019-04-07 NOTE — Telephone Encounter (Signed)
Called the patient and left several messages for patient to call office back to prechart before 9:40 virtual visit with Dr. Fletcher Anon

## 2019-04-07 NOTE — Telephone Encounter (Signed)
Left message for patient to call the office back to prechart before 9:40 virtual visit with Dr. Fletcher Anon

## 2019-04-07 NOTE — Progress Notes (Signed)
The patient could not be reached for a virtual visit.  This encounter was created in error - please disregard.

## 2019-04-08 ENCOUNTER — Encounter: Payer: Self-pay | Admitting: Physical Medicine & Rehabilitation

## 2019-04-08 ENCOUNTER — Encounter (HOSPITAL_BASED_OUTPATIENT_CLINIC_OR_DEPARTMENT_OTHER): Payer: Medicare Other | Admitting: Physical Medicine & Rehabilitation

## 2019-04-08 ENCOUNTER — Other Ambulatory Visit: Payer: Self-pay | Admitting: Physical Medicine & Rehabilitation

## 2019-04-08 VITALS — BP 143/86 | HR 90 | Temp 97.3°F | Ht 60.0 in | Wt 178.0 lb

## 2019-04-08 DIAGNOSIS — G8929 Other chronic pain: Secondary | ICD-10-CM

## 2019-04-08 DIAGNOSIS — M542 Cervicalgia: Secondary | ICD-10-CM

## 2019-04-08 DIAGNOSIS — Z720 Tobacco use: Secondary | ICD-10-CM | POA: Insufficient documentation

## 2019-04-08 DIAGNOSIS — M5412 Radiculopathy, cervical region: Secondary | ICD-10-CM

## 2019-04-08 DIAGNOSIS — F17200 Nicotine dependence, unspecified, uncomplicated: Secondary | ICD-10-CM

## 2019-04-08 DIAGNOSIS — M25511 Pain in right shoulder: Secondary | ICD-10-CM | POA: Diagnosis present

## 2019-04-08 DIAGNOSIS — M792 Neuralgia and neuritis, unspecified: Secondary | ICD-10-CM

## 2019-04-08 DIAGNOSIS — M791 Myalgia, unspecified site: Secondary | ICD-10-CM | POA: Diagnosis not present

## 2019-04-08 DIAGNOSIS — R200 Anesthesia of skin: Secondary | ICD-10-CM | POA: Diagnosis not present

## 2019-04-08 DIAGNOSIS — G894 Chronic pain syndrome: Secondary | ICD-10-CM

## 2019-04-08 DIAGNOSIS — M47812 Spondylosis without myelopathy or radiculopathy, cervical region: Secondary | ICD-10-CM

## 2019-04-08 DIAGNOSIS — M4802 Spinal stenosis, cervical region: Secondary | ICD-10-CM

## 2019-04-08 DIAGNOSIS — R202 Paresthesia of skin: Secondary | ICD-10-CM

## 2019-04-08 DIAGNOSIS — M898X1 Other specified disorders of bone, shoulder: Secondary | ICD-10-CM

## 2019-04-08 MED ORDER — DICLOFENAC SODIUM 50 MG PO TBEC: 50.0000 mg | DELAYED_RELEASE_TABLET | Freq: Three times a day (TID) | ORAL | 1 refills | Status: DC

## 2019-04-08 MED ORDER — AMITRIPTYLINE HCL 25 MG PO TABS: 25.0000 mg | ORAL_TABLET | Freq: Every day | ORAL | 1 refills | Status: DC

## 2019-04-08 MED ORDER — GABAPENTIN 600 MG PO TABS: 1200.0000 mg | ORAL_TABLET | Freq: Three times a day (TID) | ORAL | 1 refills | Status: DC

## 2019-04-08 MED ORDER — CYCLOBENZAPRINE HCL 10 MG PO TABS: 10.0000 mg | ORAL_TABLET | Freq: Three times a day (TID) | ORAL | 1 refills | Status: DC | PRN
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
## 2019-04-14 ENCOUNTER — Telehealth: Payer: Self-pay | Admitting: Cardiovascular Disease

## 2019-04-14 NOTE — Telephone Encounter (Signed)
Dr. Fletcher Anon, pt needs cervical disc surgery - ok to be off ASA for 5 days prior?  No Obstructing CAD only PAD - please send recommendations to pre-op pool thanks.

## 2019-04-14 NOTE — Telephone Encounter (Signed)
Hold aspirin 5 days before.

## 2019-04-14 NOTE — Telephone Encounter (Signed)
° °  [facility] Pre-operative Risk Assessment    Request for surgical clearance:  1. What type of surgery is being performed? Cervical Surgery General Anesthesia   2. When is this surgery scheduled? 04/30/19  3. What type of clearance is required (medical clearance vs. Pharmacy clearance to hold med vs. Both)? Both   4. Are there any medications that need to be held prior to surgery and how long? please advise  5. Practice name and name of physician performing surgery?  [facility] Dr. Kathyrn Sheriff  6. What is your office phone number [phone] ext [phone]    7.   What is your office fax number [phone]  8.   Anesthesia type (None, local, MAC, general) ? Not noted    Amy Bruce 04/14/2019, 4:28 PM  _________________________________________________________________   (provider comments below)

## 2019-04-15 NOTE — Telephone Encounter (Signed)
   Attempted to contact patient to discuss preop assessment. Left a voicemail for patient to call back.   Abigail Butts, PA-C 04/15/19; 8:29 AM

## 2019-04-15 NOTE — Telephone Encounter (Signed)
   Primary Cardiologist: Kathlyn Sacramento, MD  Chart reviewed as part of pre-operative protocol coverage. Patient was contacted 04/15/2019 in reference to pre-operative risk assessment for pending surgery as outlined below.  Amy Bruce was last seen on 12/09/2018 by Dr. Fletcher Anon.  Since that day, Amy Bruce has continued to experience LE claudication R>L. No complaints of chest pain/SOB. She mentions that during her last visit with Dr. Fletcher Anon there was discussion of possible LE stent intervention in the future if symptoms persist. She is scheduled to see Dr. Fletcher Anon 04/21/2019. Will route to Dr. Fletcher Anon to address preoperative clearance at that visit given ongoing LE pain.   She is on aspirin and cilostazol for DAPT. Dr. Fletcher Anon cleared patient to hold aspirin for 5 days prior to surgery but will also need to address holding cilostazol at her follow-up visit on 04/21/2019.    Preop Callback:  Please contact the requesting surgeons office to notify them of her appointment on 04/21/2019 with Dr. Fletcher Anon.    I will remove this encounter from the pre-op pool.   Abigail Butts, PA-C 04/15/2019, 10:42 AM      Abigail Butts, PA-C 04/15/2019, 10:20 AM

## 2019-04-20 ENCOUNTER — Other Ambulatory Visit: Payer: Self-pay | Admitting: Physical Medicine & Rehabilitation

## 2019-04-21 ENCOUNTER — Ambulatory Visit (INDEPENDENT_AMBULATORY_CARE_PROVIDER_SITE_OTHER): Payer: Medicare Other | Admitting: Cardiovascular Disease

## 2019-04-21 ENCOUNTER — Encounter: Payer: Self-pay | Admitting: Cardiovascular Disease

## 2019-04-21 ENCOUNTER — Other Ambulatory Visit: Payer: Self-pay

## 2019-04-21 VITALS — BP 188/68 | HR 106 | Temp 97.3°F | Ht 60.0 in | Wt 176.6 lb

## 2019-04-21 DIAGNOSIS — E785 Hyperlipidemia, unspecified: Secondary | ICD-10-CM

## 2019-04-21 DIAGNOSIS — I10 Essential (primary) hypertension: Secondary | ICD-10-CM | POA: Diagnosis not present

## 2019-04-21 DIAGNOSIS — Z720 Tobacco use: Secondary | ICD-10-CM

## 2019-04-21 DIAGNOSIS — Z01810 Encounter for preprocedural cardiovascular examination: Secondary | ICD-10-CM | POA: Diagnosis not present

## 2019-04-21 DIAGNOSIS — I739 Peripheral vascular disease, unspecified: Secondary | ICD-10-CM | POA: Diagnosis not present

## 2019-04-21 MED ORDER — LISINOPRIL 20 MG PO TABS: 20.0000 mg | ORAL_TABLET | Freq: Every day | ORAL | 3 refills | Status: DC

## 2019-04-21 NOTE — Patient Instructions (Signed)
Medication Instructions:  Start: Lisinopril 20 mg daily  Ok to hold Aspirin and Cilostazol 5 days prior to surgery  If you need a refill on your cardiac medications before your next appointment, please call your pharmacy.   Lab work: None  Testing/Procedures: None  Follow-Up: At [facility], you and your health needs are our priority.  As part of our continuing mission to provide you with exceptional heart care, we have created designated Provider Care Teams.  These Care Teams include your primary Cardiologist (physician) and Advanced Practice Providers (APPs -  Physician Assistants and Nurse Practitioners) who all work together to provide you with the care you need, when you need it. You will need a follow up appointment in 4 months.  Please call our office 2 months in advance to schedule this appointment.  You may see Kathlyn Sacramento, MD or one of the following Advanced Practice Providers on your designated Care Team:   Kerin Ransom, PA-C Roby Lofts, [place] . Sande Rives, PA-C  Any Other Special Instructions Will Be Listed Below (If Applicable)._  -You are considered Low risk for back surgery

## 2019-04-21 NOTE — Progress Notes (Signed)
Cardiology Office Note   Date:  04/21/2019   ID:  Amy Bruce, DOB Aug 24, 1951, MRN [id number]  PCP:  London Pepper, MD  Cardiologist:   Kathlyn Sacramento, MD   No chief complaint on file.     History of Present Illness: Amy Bruce is a 68 y.o. female who is here today for a follow-up visit regarding peripheral arterial disease and preoperative cardiovascular evaluation for back surgery.  She has known history of peripheral arterial disease, tobacco use, essential hypertension and hyperlipidemia.  Previous CT imaging showed evidence of aortic and coronary calcifications.  She has prolonged history of plantar fasciitis. She has known history of peripheral arterial disease with moderately reduced ABI on the right at 0.54 and mild on the left at 0.81. Angiography in July 2019 showed diffuse right SFA disease with medium length occlusion in the mid to distal segment.  On the left there was moderate ostial left SFA stenosis with two-vessel runoff below the knee.  She was referred to vascular surgery to consider right femoral-popliteal bypass but medical therapy was pursued after.  She has been on cilostazol. Lexiscan Myoview in September 2019 showed no evidence of ischemia with normal ejection fraction.  She cut down on tobacco use to 1 cigarette a day.  She has significant disc herniation and is scheduled for back surgery later this month.  She is here for preop evaluation.  She denies any chest pain or shortness of breath.  She continues to have right calf claudication with walking.  She is no longer on lisinopril but is noted to be very hypertensive today.   Past Medical History:  Diagnosis Date  . Anemia    in the past  . Anxiety   . Arthritis   . 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 Fibromyalgia   . GERD (gastroesophageal reflux disease)   . Heart murmur    due to rheumatic fever, PCP no longer hears it  . Hyperlipidemia   . Hypertension   .  Myalgia   . Neuropathy   . PAD (peripheral artery disease) ([place])   . PAD (peripheral artery disease) ([place])   . Pre-diabetes   . Pyelonephritis 09/10/2019    "septic"  . Tobacco abuse   . 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 ABDOMINAL AORTOGRAM W/LOWER EXTREMITY N/A 04/02/2018   Procedure: ABDOMINAL AORTOGRAM W/LOWER EXTREMITY;  Surgeon: Wellington Hampshire, MD;  Location: [facility];  Service: Cardiovascular;  Laterality: N/A;  . ABDOMINAL HYSTERECTOMY  1999  . [place]   C5/6 6/7  . COLONOSCOPY    . ECTOPIC PREGNANCY SURGERY    . Left axillary cyst removal 1989    . MULTIPLE EXTRACTIONS WITH ALVEOLOPLASTY N/A 10/25/2017   Procedure: EXTRACTION NUMBERS FIVE, SEVEN, EIGHT, NINE, TEN, FIFTEEN, EIGHTEEN WITH ALVEOLOPLASTY, IRRIGATION AND DEBRIDEMENT LEFT SUBMANDIBULAR INFECTION;  Surgeon: Diona Browner, DDS;  Location: [facility];  Service: Oral Surgery;  Laterality: N/A;  . RADIOLOGY WITH ANESTHESIA N/A 11/18/2018   Procedure: MRI OF THE CERVICAL SPINE WITHOUT CONTRAST;  Surgeon: Radiologist, Medication, MD;  Location: [facility];  Service: Radiology;  Laterality: N/A;  . TONSILECTOMY, ADENOIDECTOMY, BILATERAL MYRINGOTOMY AND TUBES  1981   put not aware of tubes being put in ears  . TUBAL LIGATION  1981     Current Outpatient Medications  Medication Sig Dispense  Refill  . amitriptyline (ELAVIL) 25 MG tablet TAKE 1 TABLET(25 MG) BY MOUTH AT BEDTIME 30 tablet 1  . aspirin EC 81 MG tablet Take 81 mg by mouth daily.    Marland Kitchen atorvastatin (LIPITOR) 20 MG tablet Take 20 mg by mouth daily.     . calcium carbonate (TUMS - DOSED IN MG ELEMENTAL CALCIUM) 500 MG chewable tablet Chew 2 tablets by mouth daily as needed for indigestion or heartburn.     . Camphor-Eucalyptus-Menthol (VICKS VAPORUB EX) Apply 1 application topically daily as needed (congestion).    . cetirizine (ZYRTEC) 10 MG tablet Take 10 mg by mouth  daily as needed for allergies.    . cilostazol (PLETAL) 100 MG tablet Take 1 tablet (100 mg total) by mouth 2 (two) times daily. 90 tablet 3  . cyclobenzaprine (FLEXERIL) 10 MG tablet Take 1 tablet (10 mg total) by mouth 3 (three) times daily as needed for muscle spasms. 90 tablet 1  . diclofenac (VOLTAREN) 50 MG EC tablet Take 1 tablet (50 mg total) by mouth 3 (three) times daily. 90 tablet 1  . fluconazole (DIFLUCAN) 150 MG tablet TK 1 T PO 1 TIME. MAY REPEAT IN 3-5 DAYS PRN PO 1 DAY    . gabapentin (NEURONTIN) 600 MG tablet Take 2 tablets (1,200 mg total) by mouth 3 (three) times daily. 180 tablet 1  . lidocaine (LIDODERM) 5 % UNWRAP AND APPLY 1 PATCH TO SKIN DAILY. REMOVE AND DISCARD PATCH WITHIN 12 HOURS OR AS DIRECTED BY MD. (Patient taking differently: Place 1 patch onto the skin daily as needed (for pain). ) 30 patch 1  . nicotine (NICODERM CQ - DOSED IN MG/24 HOURS) 21 mg/24hr patch Place 21 mg onto the skin daily as needed (smoking cessation).    . nicotine polacrilex (NICORETTE) 2 MG lozenge Take 2 mg by mouth as needed for smoking cessation.    Marland Kitchen omeprazole (PRILOSEC) 20 MG capsule Take 20 mg by mouth daily.     . VENTOLIN HFA 108 (90 Base) MCG/ACT inhaler Inhale 1-2 puffs into the lungs every 4 (four) hours as needed for wheezing.   1  . vitamin B-12 (CYANOCOBALAMIN) 500 MCG tablet Take 500 mcg by mouth daily.     No current facility-administered medications for this visit.     Allergies:   Carisoprodol and Duloxetine hcl    Social History:  The patient  reports that she has been smoking cigarettes. She has a 33.00 pack-year smoking history. She has never used smokeless tobacco. She reports current alcohol use of about 2.0 standard drinks of alcohol per week. She reports that she does not use drugs.   Family History:  The patient's family history includes Ovarian cancer in her mother.    ROS:  Please see the history of present illness.   Otherwise, review of systems are positive  for none.   All other systems are reviewed and negative.    PHYSICAL EXAM: VS:  BP (!) 188/68   Pulse (!) 106   Temp (!) 97.3 F (36.3 C)   Ht 5' (1.524 m)   Wt 176 lb 9.6 oz (80.1 kg)   SpO2 98%   BMI 34.49 kg/m  , BMI Body mass index is 34.49 kg/m. GEN: Well nourished, well developed, in no acute distress  HEENT: normal  Neck: no JVD, carotid bruits, or masses Cardiac: RRR; no murmurs, rubs, or gallops,no edema  Respiratory:  clear to auscultation bilaterally, normal work of breathing GI: soft, nontender, nondistended, +  BS MS: no deformity or atrophy  Skin: warm and dry, no rash Neuro:  Strength and sensation are intact Psych: euthymic mood, full affect Vascular: Femoral pulses are +1 bilaterally.  Distal pulses are not palpable   EKG:  EKG is ordered today. EKG showed sinus tachycardia with poor R wave progression in the anterior leads.  No significant ST or T wave changes.  Recent Labs: 09/09/2018: ALT 22 11/18/2018: BUN 14; Creatinine, Ser 1.05; Hemoglobin 11.6; Platelets 292; Potassium 3.5; Sodium 138    Lipid Panel No results found for: CHOL, TRIG, HDL, CHOLHDL, VLDL, LDLCALC, LDLDIRECT    Wt Readings from Last 3 Encounters:  04/21/19 176 lb 9.6 oz (80.1 kg)  04/08/19 178 lb (80.7 kg)  02/12/19 172 lb 9.6 oz (78.3 kg)       No flowsheet data found.    ASSESSMENT AND PLAN:  1.  Preop cardiovascular evaluation for back surgery: The patient has no anginal symptoms.  Functional capacity is reduced but reasonable.  Stress test last year showed no evidence of ischemia with normal ejection fraction.  Based on this, the patient can proceed with surgery at an overall low risk from a cardiac standpoint. I asked her to hold aspirin and cilostazol 5 days before surgery.  2. Peripheral arterial disease: Patient has claudication worse on the right side.  We can consider endovascular revascularization of the right SFA based on her and symptoms.  Hopefully after her  back surgery will be able to tell more how much she is limited by claudication.  3.  Tobacco use: She cut down to 1 cigarette a day.  4.  Hyperlipidemia: Continue atorvastatin with a target LDL of less than 70.  5.  Essential hypertension: She is no longer on lisinopril.  She reports whitecoat syndrome but her blood pressure is actually very elevated today.  I asked her to resume lisinopril 20 mg daily.   Disposition:   FU with me in 4 months  Signed,  Kathlyn Sacramento, MD  04/21/2019 2:43 PM    [place]

## 2019-05-14 ENCOUNTER — Other Ambulatory Visit: Payer: Self-pay

## 2019-05-14 ENCOUNTER — Ambulatory Visit (INDEPENDENT_AMBULATORY_CARE_PROVIDER_SITE_OTHER)
Admission: RE | Admit: 2019-05-14 | Discharge: 2019-05-14 | Disposition: A | Discharge status: Home / Self Care | Payer: Medicare Other | Source: Ambulatory Visit | Attending: Acute Care | Admitting: Acute Care

## 2019-05-14 DIAGNOSIS — F17210 Nicotine dependence, cigarettes, uncomplicated: Secondary | ICD-10-CM

## 2019-05-14 DIAGNOSIS — Z122 Encounter for screening for malignant neoplasm of respiratory organs: Secondary | ICD-10-CM

## 2019-05-19 ENCOUNTER — Other Ambulatory Visit: Payer: Self-pay

## 2019-05-19 ENCOUNTER — Encounter: Payer: Medicare Other | Attending: Physical Medicine & Rehabilitation | Admitting: Physical Medicine & Rehabilitation

## 2019-05-19 ENCOUNTER — Encounter: Payer: Self-pay | Admitting: Physical Medicine & Rehabilitation

## 2019-05-19 VITALS — BP 125/77 | HR 109 | Temp 97.7°F | Ht 60.0 in | Wt 177.0 lb

## 2019-05-19 DIAGNOSIS — M4802 Spinal stenosis, cervical region: Secondary | ICD-10-CM

## 2019-05-19 DIAGNOSIS — M791 Myalgia, unspecified site: Secondary | ICD-10-CM

## 2019-05-19 DIAGNOSIS — M47812 Spondylosis without myelopathy or radiculopathy, cervical region: Secondary | ICD-10-CM

## 2019-05-19 DIAGNOSIS — R202 Paresthesia of skin: Secondary | ICD-10-CM

## 2019-05-19 DIAGNOSIS — G894 Chronic pain syndrome: Secondary | ICD-10-CM | POA: Diagnosis not present

## 2019-05-19 DIAGNOSIS — M5412 Radiculopathy, cervical region: Secondary | ICD-10-CM | POA: Insufficient documentation

## 2019-05-19 DIAGNOSIS — M25511 Pain in right shoulder: Secondary | ICD-10-CM | POA: Diagnosis not present

## 2019-05-19 DIAGNOSIS — G8929 Other chronic pain: Secondary | ICD-10-CM | POA: Diagnosis present

## 2019-05-19 DIAGNOSIS — R200 Anesthesia of skin: Secondary | ICD-10-CM
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
## 2019-05-20 ENCOUNTER — Telehealth: Payer: Self-pay

## 2019-05-20 DIAGNOSIS — Z122 Encounter for screening for malignant neoplasm of respiratory organs: Secondary | ICD-10-CM

## 2019-05-20 DIAGNOSIS — F17210 Nicotine dependence, cigarettes, uncomplicated: Secondary | ICD-10-CM

## 2019-05-20 DIAGNOSIS — Z87891 Personal history of nicotine dependence: Secondary | ICD-10-CM

## 2019-05-20 NOTE — Telephone Encounter (Signed)
Patient returning call for Doroteo Glassman, RN who left a message to discuss CT scan results.  Patient requested Langley Gauss call her when she returns to the office.  Will route message to Doroteo Glassman for follow up. Patient acknowledged understanding. Nothing further needed at this time.   Route to [facility] for follow up.

## 2019-05-22 NOTE — Telephone Encounter (Signed)
LMTC x 1  

## 2019-05-22 NOTE — Telephone Encounter (Signed)
Pt informed of CT results per Eric Form, NP.  PT verbalized understanding.  Copy sent to PCP.  Order placed for 1 yr f/u CT. Appt made to see Judson Roch 05/27/19 2:00.

## 2019-05-27 ENCOUNTER — Encounter: Payer: Self-pay | Admitting: Acute Care

## 2019-05-27 ENCOUNTER — Ambulatory Visit (INDEPENDENT_AMBULATORY_CARE_PROVIDER_SITE_OTHER): Payer: Medicare Other | Admitting: Acute Care

## 2019-05-27 ENCOUNTER — Other Ambulatory Visit: Payer: Self-pay

## 2019-05-27 VITALS — BP 142/80 | HR 119 | Temp 97.6°F | Ht 60.0 in | Wt 173.0 lb

## 2019-05-27 DIAGNOSIS — R9389 Abnormal findings on diagnostic imaging of other specified body structures: Secondary | ICD-10-CM | POA: Diagnosis not present

## 2019-05-27 DIAGNOSIS — Z122 Encounter for screening for malignant neoplasm of respiratory organs: Secondary | ICD-10-CM | POA: Diagnosis not present

## 2019-05-27 DIAGNOSIS — F17210 Nicotine dependence, cigarettes, uncomplicated: Secondary | ICD-10-CM

## 2019-05-27 DIAGNOSIS — Z720 Tobacco use: Secondary | ICD-10-CM

## 2019-05-27 MED ORDER — VENTOLIN HFA 108 (90 BASE) MCG/ACT IN AERS: 1.0000 | INHALATION_SPRAY | RESPIRATORY_TRACT | 1 refills | Status: DC | PRN

## 2019-05-27 NOTE — Progress Notes (Signed)
History of Present Illness Amy Bruce is a 68 y.o. female current every day smoker followed through the screening program. She is here for follow up of her screening scan results.   05/28/2019 Follow up to discuss LDCT Scan results Pt. Presents for follow up. We have reviewed her  CT scan. From a lung cancer perspective her scan was read as a Lung RADS 1, negative study: no nodules or definitely benign nodules. Radiology recommendation is for a repeat LDCT in 12 months. There was however notation of interval development of patchy ground-glass opacity in the central right upper lobe, likely reflecting infectious/inflammatory alveolitis. Atypical infection would be a consideration. Despite this the patient denies any symptoms at all of upper respiratory issues.  She states she does not cough at all. She does not cough up sputum. She states she does have some sinus drainage. She does have GERD.She is on omeprazole. She is compliant with this. She denies any fever. She has not had any change in secretions, no change in color of secretions. She has a rescue inhaler which she has not used in greater than a year. We will send in a prescription for her rescue as it has expired. She denies chest pain, orthopnea or leg pain. She recently had a cervical neck surgery, and is in a cervical collar. She has follow up with her orthopedic physician scheduled. We discussed the need to do a sputum culture to ensure she did not need treatment. I will not treat without micro as the patient is asymptomatic. She denies fever, chest pain, orthopnea or hemoptysis.   Test Results: 05/14/2019 Low Dose CT Scan for Lung Cancer Screening Lung-RADS 1, negative. Continue annual screening with low-dose chest CT without contrast in 12 months. 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 Atypical infection would be a  consideration. Emphysema. [id number]) Aortic Atherosclerois (ICD10-170.0)  CBC Latest Ref Rng & Units 11/18/2018 09/11/2018 09/10/2018  WBC 4.0 - 10.5 K/uL 8.1 7.1 10.6(H)  Hemoglobin 12.0 - 15.0 g/dL 11.6(L) 9.9(L) 11.9(L)  Hematocrit 36.0 - 46.0 % 36.5 31.1(L) 36.1  Platelets 150 - 400 K/uL 292 165 203    BMP Latest Ref Rng & Units 11/18/2018 09/11/2018 09/10/2018  Glucose 70 - 99 mg/dL 117(H) 117(H) 108(H)  BUN 8 - 23 mg/dL 14 19 14   Creatinine 0.44 - 1.00 mg/dL 1.05(H) 1.15(H) 1.16(H)  BUN/Creat Ratio 12 - 28 - - -  Sodium 135 - 145 mmol/L 138 138 136  Potassium 3.5 - 5.1 mmol/L 3.5 3.5 4.1  Chloride 98 - 111 mmol/L 103 107 99  CO2 22 - 32 mmol/L 23 23 26   Calcium 8.9 - 10.3 mg/dL 9.3 8.3(L) 9.4    BNP No results found for: BNP  ProBNP No results found for: PROBNP  PFT No results found for: FEV1PRE, FEV1POST, FVCPRE, FVCPOST, TLC, DLCOUNC, PREFEV1FVCRT, PSTFEV1FVCRT  Ct Chest Lung Ca Screen Low Dose W/o Cm  Result Date: 05/14/2019 CLINICAL DATA:  68 year old female with 36 pack-year history of smoking. Lung cancer screening. EXAM: CT CHEST WITHOUT CONTRAST LOW-DOSE FOR LUNG CANCER SCREENING TECHNIQUE: Multidetector CT imaging of the chest was performed following the standard protocol without IV contrast. COMPARISON:  03/24/2018 FINDINGS: Cardiovascular: The heart size is normal. No substantial pericardial effusion. Coronary artery calcification is evident. Atherosclerotic calcification is noted in the wall of the thoracic aorta. Mediastinum/Nodes: No mediastinal lymphadenopathy. No evidence for gross hilar lymphadenopathy although assessment is limited by the lack of  intravenous contrast on today's study. The esophagus has normal imaging features. There is no axillary lymphadenopathy. Lungs/Pleura: Biapical pleuroparenchymal scarring noted. Centrilobular emphsyema noted. Patchy ground-glass attenuation is noted in the central right upper lobe. No new suspicious pulmonary nodule or mass.  No focal airspace consolidation. No pleural effusion. Upper Abdomen: 1.7 cm low-density lesion upper pole left kidney is stable, compatible with cyst. Musculoskeletal: No worrisome lytic or sclerotic osseous abnormality. IMPRESSION: 1. Lung-RADS 1, negative. Continue annual screening with low-dose chest CT without contrast in 12 months. 2. 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 Atypical infection would be a consideration. 3.  Emphysema. (ICD10-J43.9) 4.  Aortic Atherosclerois (ICD10-170.0) Electronically Signed   By: Misty Stanley M.D.   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   Fibromyalgia    GERD (gastroesophageal reflux disease)    Heart murmur    due to rheumatic fever, PCP no longer hears it   Hyperlipidemia    Hypertension    Myalgia    Neuropathy    PAD (peripheral artery disease) ([facility])    PAD (peripheral artery disease) ([place])    Pre-diabetes    Pyelonephritis 09/10/2019    "septic"   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, has to have chest xray     Social History   Tobacco Use   Smoking status: Current Every Day Smoker    Packs/day: 1.00    Years: 33.00    Pack years: 33.00    Types: Cigarettes   Smokeless tobacco: Never Used   Tobacco comment: 1 cigarettes per day  Substance Use Topics   Alcohol use: Yes    Alcohol/week: 2.0 standard drinks    Types: 2 Shots of liquor per week    Comment: 1-2 wk   Drug use: No    Ms.Tonelli reports that she has been smoking cigarettes. She has a 33.00 pack-year smoking history. She has never used smokeless tobacco. She reports current alcohol use of about 2.0 standard drinks of alcohol per week. She reports that she does not use drugs.  Tobacco  Cessation: Current every day smoker with a 33 pack year smoking history.  Past surgical hx, Family hx, Social hx all reviewed.  Current Outpatient Medications on File Prior to Visit  Medication Sig   amitriptyline (ELAVIL) 25 MG tablet TAKE 1 TABLET(25 MG) BY MOUTH AT BEDTIME   aspirin EC 81 MG tablet Take 81 mg by mouth daily.   atorvastatin (LIPITOR) 20 MG tablet Take 20 mg by mouth daily.    calcium carbonate (TUMS - DOSED IN MG ELEMENTAL CALCIUM) 500 MG chewable tablet Chew 2 tablets by mouth daily as needed for indigestion or heartburn.    Camphor-Eucalyptus-Menthol (VICKS VAPORUB EX) Apply 1 application topically daily as needed (congestion).   cetirizine (ZYRTEC) 10 MG tablet Take 10 mg by mouth daily as needed for allergies.   cilostazol (PLETAL) 100 MG tablet Take 1 tablet (100 mg total) by mouth 2 (two) times daily.   cyclobenzaprine (FLEXERIL) 10 MG tablet Take 1 tablet (10 mg total) by mouth 3 (three) times daily as needed for muscle spasms.   fluconazole (DIFLUCAN) 150 MG tablet TK 1 T PO 1 TIME. MAY REPEAT  IN 3-5 DAYS PRN PO 1 DAY   gabapentin (NEURONTIN) 600 MG tablet Take 2 tablets (1,200 mg total) by mouth 3 (three) times daily.   lidocaine (LIDODERM) 5 % UNWRAP AND APPLY 1 PATCH TO SKIN DAILY. REMOVE AND DISCARD PATCH WITHIN 12 HOURS OR AS DIRECTED BY MD. (Patient taking differently: Place 1 patch onto the skin daily as needed (for pain). )   lisinopril (ZESTRIL) 20 MG tablet Take 1 tablet (20 mg total) by mouth daily.   nicotine (NICODERM CQ - DOSED IN MG/24 HOURS) 21 mg/24hr patch Place 21 mg onto the skin daily as needed (smoking cessation).   nicotine polacrilex (NICORETTE) 2 MG lozenge Take 2 mg by mouth as needed for smoking cessation.   omeprazole (PRILOSEC) 20 MG capsule Take 20 mg by mouth daily.    oxyCODONE-acetaminophen (PERCOCET) 7.5-325 MG tablet Take 1 tablet by mouth every 4 (four) hours as needed for severe pain.   vitamin B-12  (CYANOCOBALAMIN) 500 MCG tablet Take 500 mcg by mouth daily.   No current facility-administered medications on file prior to visit.      Allergies  Allergen Reactions   Carisoprodol Swelling   Duloxetine Hcl Swelling and Hypertension    Review Of Systems:  Constitutional:   No  weight loss, night sweats,  Fevers, chills, fatigue, or  lassitude.  HEENT:   No headaches,  Difficulty swallowing,  Tooth/dental problems, or  Sore throat,                No sneezing, itching, ear ache, nasal congestion,+  post nasal drip,   CV:  No chest pain,  Orthopnea, PND, swelling in lower extremities, anasarca, dizziness, palpitations, syncope.   GI  No heartburn, indigestion, abdominal pain, nausea, vomiting, diarrhea, change in bowel habits, loss of appetite, bloody stools.   Resp: No shortness of breath with exertion or at rest.  No excess mucus, no productive cough,  No non-productive cough,  No coughing up of blood.  No change in color of mucus.  No wheezing.  No chest wall deformity  Skin: no rash or lesions.  GU: no dysuria, change in color of urine, no urgency or frequency.  No flank pain, no hematuria   MS:  No joint pain or swelling.  No decreased range of motion.  No back pain.  Psych:  No change in mood or affect. No depression or anxiety.  No memory loss.   Vital Signs BP (!) 142/80 (BP Location: Right Arm, Cuff Size: Normal)    Pulse (!) 119    Temp 97.6 F (36.4 C) (Oral)    Ht 5' (1.524 m)    Wt 173 lb (78.5 kg)    SpO2 99%    BMI 33.79 kg/m    Physical Exam:  General- No distress,  A&Ox3, pleasant ENT: No sinus tenderness, TM clear, pale nasal mucosa, no oral exudate,+ post nasal drip, no LAN, wearing cervical collar >> recent cervical surgery Cardiac: S1, S2, regular rate and rhythm, no murmur Chest: No wheeze/ rales/ dullness; no accessory muscle use, no nasal flaring, no sternal retractions, few rhonchi which clear with cough Abd.: Soft Non-tender, ND, BS +, Body mass  index is 33.79 kg/m. Ext: No clubbing cyanosis, edema Neuro:  normal strength, MAE x 4, A&O x 3 Skin: No rashes, warm and dry Psych: normal mood and behavior   Assessment/Plan Abnormal LDCT Interval development of patchy ground-glass opacity in the central right upper lobe, likely reflecting infectious/inflammatory alveolitis. Atypical infection would be  a consideration. Lung RADS 1, negative study: no nodules or definitely benign nodules. Radiology recommendation is for a repeat LDCT in 12 months. Plan: We will send in a prescription for your rescue inhaler ( Ventolin). Use as needed for breakthrough shortness of breath or wheezing up to 3 times daily. If you find you are using it frequently, please call to be seen. . We will schedule your annual lung cancer screening for Sept. 2021. We will collect a sputum specimen  to evaluate for atypical bacteria We will give you a specimen cup to use to collect the sputum. We will give you instructions on how to collect the specimen.  We will call you with the results. Follow up as needed Please contact office for sooner follow up if symptoms do not improve or worsen or seek emergency care   Tobacco Abuse  Continues to smoke daily Plan Consider PFT's  Renew rescue inhaler Rx. I have spent 3 minutes counseling patient on smoking cessation this visit. Patient verbalizes understanding of their  choice to continue smoking and the negative health consequences including worsening of COPD, risk of lung cancer , stroke and heart disease.  Lung Cancer Screening 05/2019 LDCT Lung RADS 1, negative study: no nodules or definitely benign nodules. Radiology recommendation is for a repeat LDCT in 12 months. Annual screening scan 05/2020 Counseling for smoking cessation   This appointment was 30 min long with over 50% of the time in direct face-to-face patient care, assessment, plan of care, and follow-up.    Magdalen Spatz, NP 05/28/2019  10:08  AM

## 2019-05-27 NOTE — Patient Instructions (Addendum)
It is good to see you today. We will send in a prescription for your rescue inhaler ( Ventolin). Use as needed for breakthrough shortness of breath or wheezing up to 3 times daily. If you find you are using it frequently, please call to be seen. . We will schedule your annual lung cancer screening for Sept. 2021. We will collect a sputum specimen  to evaluate for atypical bacteria We will give you a specimen cup to use to collect the sputum. We will give you instructions on how to collect the specimen.  We will call you with the results. Annual Lung Cancer Screening scan 05/2020 Follow up as needed Please contact office for sooner follow up if symptoms do not improve or worsen or seek emergency care

## 2019-05-28 ENCOUNTER — Encounter: Payer: Self-pay | Admitting: Acute Care

## 2019-06-07 ENCOUNTER — Other Ambulatory Visit: Payer: Self-pay | Admitting: Physical Medicine & Rehabilitation

## 2019-06-15 ENCOUNTER — Telehealth: Payer: Self-pay | Admitting: Acute Care

## 2019-06-15 MED ORDER — ALBUTEROL SULFATE HFA 108 (90 BASE) MCG/ACT IN AERS: 1.0000 | INHALATION_SPRAY | RESPIRATORY_TRACT | 5 refills | Status: DC | PRN

## 2019-06-15 NOTE — Telephone Encounter (Signed)
Spoke to pt. She is requesting a refill on generic Ventolin. Rx has been sent in. Nothing further was needed.

## 2019-06-25 ENCOUNTER — Encounter: Payer: Medicare Other | Admitting: Physical Medicine & Rehabilitation

## 2019-06-26 ENCOUNTER — Encounter: Payer: Medicare Other | Attending: Physical Medicine & Rehabilitation | Admitting: Physical Medicine & Rehabilitation

## 2019-06-26 DIAGNOSIS — M25511 Pain in right shoulder: Secondary | ICD-10-CM | POA: Insufficient documentation

## 2019-06-26 DIAGNOSIS — G8929 Other chronic pain: Secondary | ICD-10-CM | POA: Insufficient documentation

## 2019-06-26 DIAGNOSIS — G894 Chronic pain syndrome: Secondary | ICD-10-CM | POA: Insufficient documentation

## 2019-06-26 DIAGNOSIS — M5412 Radiculopathy, cervical region: Secondary | ICD-10-CM | POA: Insufficient documentation

## 2019-07-08 ENCOUNTER — Other Ambulatory Visit: Payer: Self-pay | Admitting: Family Medicine

## 2019-07-08 DIAGNOSIS — Z1231 Encounter for screening mammogram for malignant neoplasm of breast: Secondary | ICD-10-CM

## 2019-07-10 ENCOUNTER — Ambulatory Visit
Admission: RE | Admit: 2019-07-10 | Discharge: 2019-07-10 | Disposition: A | Discharge status: Home / Self Care | Payer: Medicare Other | Source: Ambulatory Visit | Attending: Family Medicine | Admitting: Family Medicine

## 2019-07-10 ENCOUNTER — Other Ambulatory Visit: Payer: Self-pay

## 2019-07-10 ENCOUNTER — Other Ambulatory Visit: Payer: Self-pay | Admitting: Physical Medicine & Rehabilitation

## 2019-07-10 DIAGNOSIS — Z1231 Encounter for screening mammogram for malignant neoplasm of breast: Secondary | ICD-10-CM

## 2019-08-06 ENCOUNTER — Other Ambulatory Visit: Payer: Self-pay | Admitting: Physical Medicine & Rehabilitation

## 2019-08-18 ENCOUNTER — Other Ambulatory Visit: Payer: Self-pay

## 2019-08-18 ENCOUNTER — Ambulatory Visit (INDEPENDENT_AMBULATORY_CARE_PROVIDER_SITE_OTHER): Payer: Medicare Other | Admitting: Cardiovascular Disease

## 2019-08-18 ENCOUNTER — Encounter: Payer: Self-pay | Admitting: Cardiovascular Disease

## 2019-08-18 VITALS — BP 143/74 | HR 118 | Temp 96.5°F | Ht 60.0 in | Wt 171.8 lb

## 2019-08-18 DIAGNOSIS — I10 Essential (primary) hypertension: Secondary | ICD-10-CM

## 2019-08-18 DIAGNOSIS — Z01812 Encounter for preprocedural laboratory examination: Secondary | ICD-10-CM | POA: Diagnosis not present

## 2019-08-18 DIAGNOSIS — I739 Peripheral vascular disease, unspecified: Secondary | ICD-10-CM

## 2019-08-18 DIAGNOSIS — Z720 Tobacco use: Secondary | ICD-10-CM | POA: Diagnosis not present

## 2019-08-18 DIAGNOSIS — E785 Hyperlipidemia, unspecified: Secondary | ICD-10-CM

## 2019-08-18 NOTE — Progress Notes (Signed)
Cardiology Office Note   Date:  08/18/2019   ID:  Foster, [place]ill 01/09/1951, MRN [id number]  PCP:  London Pepper, MD  Cardiologist:   Kathlyn Sacramento, MD   No chief complaint on file.     History of Present Illness: Amy Bruce is a 68 y.o. female who is here today for a follow-up visit regarding peripheral arterial disease.  She has known history of peripheral arterial disease, tobacco use, essential hypertension and hyperlipidemia.  Previous CT imaging showed evidence of aortic and coronary calcifications.  She has prolonged history of plantar fasciitis. She has known history of peripheral arterial disease with moderately reduced ABI on the right at 0.54 and mild on the left at 0.81. Angiography in July 2019 showed diffuse right SFA disease with medium length occlusion in the mid to distal segment.  On the left there was moderate ostial left SFA stenosis with two-vessel runoff below the knee.  She was referred to vascular surgery to consider right femoral-popliteal bypass but medical therapy was pursued after.  She has been on cilostazol. Lexiscan Myoview in September 2019 showed no evidence of ischemia with normal ejection fraction. She continues to smoke but cut down to 1 cigarette a day.  She had back surgery done few months ago with improvement of symptoms.  Currently, she is limited by severe right calf claudication.  She also has significant bilateral plantar fasciitis.  She thinks that surgery is needed on the right side but that has been postponed due to known peripheral arterial disease ending revascularization. She has chronic sinus tachycardia.  She denies chest pain, shortness of breath or palpitations.   Past Medical History:  Diagnosis Date  . Anemia    in the past  . Anxiety   . Arthritis   . 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 Fibromyalgia   . GERD (gastroesophageal reflux disease)   . Heart murmur    due to rheumatic fever, PCP  no longer hears it  . Hyperlipidemia   . Hypertension   . Myalgia   . Neuropathy   . PAD (peripheral artery disease) ([place])   . PAD (peripheral artery disease) ([place])   . Pre-diabetes   . Pyelonephritis 09/10/2019    "septic"  . Tobacco abuse   . 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 ABDOMINAL AORTOGRAM W/LOWER EXTREMITY N/A 04/02/2018   Procedure: ABDOMINAL AORTOGRAM W/LOWER EXTREMITY;  Surgeon: Wellington Hampshire, MD;  Location: [facility];  Service: Cardiovascular;  Laterality: N/A;  . ABDOMINAL HYSTERECTOMY  1999  . [facility]   C5/6 6/7  . COLONOSCOPY    . ECTOPIC PREGNANCY SURGERY    . Left axillary cyst removal 1989    . MULTIPLE EXTRACTIONS WITH ALVEOLOPLASTY N/A 10/25/2017   Procedure: EXTRACTION NUMBERS FIVE, SEVEN, EIGHT, NINE, TEN, FIFTEEN, EIGHTEEN WITH ALVEOLOPLASTY, IRRIGATION AND DEBRIDEMENT LEFT SUBMANDIBULAR INFECTION;  Surgeon: Diona Browner, DDS;  Location: [facility];  Service: Oral Surgery;  Laterality: N/A;  . RADIOLOGY WITH ANESTHESIA N/A 11/18/2018   Procedure: MRI OF THE CERVICAL SPINE WITHOUT CONTRAST;  Surgeon: Radiologist, Medication, MD;  Location: [facility];  Service: Radiology;  Laterality: N/A;  . TONSILECTOMY, ADENOIDECTOMY, BILATERAL MYRINGOTOMY AND TUBES  1981   put not aware of tubes being put in ears  . TUBAL LIGATION  1981     Current  Outpatient Medications  Medication Sig Dispense Refill  . albuterol (VENTOLIN HFA) 108 (90 Base) MCG/ACT inhaler Inhale 1-2 puffs into the lungs every 4 (four) hours as needed for wheezing. 18 g 5  . amitriptyline (ELAVIL) 25 MG tablet TAKE 1 TABLET(25 MG) BY MOUTH AT BEDTIME 30 tablet 1  . aspirin EC 81 MG tablet Take 81 mg by mouth daily.    Marland Kitchen atorvastatin (LIPITOR) 40 MG tablet TK 1 T PO QD    . calcium carbonate (TUMS - DOSED IN MG ELEMENTAL CALCIUM) 500 MG chewable tablet Chew 2 tablets by mouth daily as needed for indigestion  or heartburn.     . Camphor-Eucalyptus-Menthol (VICKS VAPORUB EX) Apply 1 application topically daily as needed (congestion).    . cetirizine (ZYRTEC) 10 MG tablet Take 10 mg by mouth daily as needed for allergies.    . cilostazol (PLETAL) 100 MG tablet Take 1 tablet (100 mg total) by mouth 2 (two) times daily. 90 tablet 3  . cyclobenzaprine (FLEXERIL) 10 MG tablet Take 1 tablet (10 mg total) by mouth 3 (three) times daily as needed for muscle spasms. 90 tablet 1  . diclofenac (VOLTAREN) 50 MG EC tablet TAKE 1 TABLET(50 MG) BY MOUTH THREE TIMES DAILY 90 tablet 1  . fluconazole (DIFLUCAN) 150 MG tablet TK 1 T PO 1 TIME. MAY REPEAT IN 3-5 DAYS PRN PO 1 DAY    . gabapentin (NEURONTIN) 600 MG tablet TAKE 2 TABLETS(1200 MG) BY MOUTH THREE TIMES DAILY 180 tablet 1  . lidocaine (LIDODERM) 5 % UNWRAP AND APPLY 1 PATCH TO SKIN DAILY. REMOVE AND DISCARD PATCH WITHIN 12 HOURS OR AS DIRECTED BY MD. (Patient taking differently: Place 1 patch onto the skin daily as needed (for pain). ) 30 patch 1  . nicotine (NICODERM CQ - DOSED IN MG/24 HOURS) 21 mg/24hr patch Place 21 mg onto the skin daily as needed (smoking cessation).    . nicotine polacrilex (NICORETTE) 2 MG lozenge Take 2 mg by mouth as needed for smoking cessation.    Marland Kitchen omeprazole (PRILOSEC) 20 MG capsule Take 20 mg by mouth daily.     Marland Kitchen oxyCODONE-acetaminophen (PERCOCET) 7.5-325 MG tablet Take 1 tablet by mouth every 4 (four) hours as needed for severe pain.    . vitamin B-12 (CYANOCOBALAMIN) 500 MCG tablet Take 500 mcg by mouth daily.    Marland Kitchen lisinopril (ZESTRIL) 20 MG tablet Take 1 tablet (20 mg total) by mouth daily. (Patient not taking: Reported on 08/18/2019) 90 tablet 3   No current facility-administered medications for this visit.     Allergies:   Carisoprodol and Duloxetine hcl    Social History:  The patient  reports that she has been smoking cigarettes. She has a 33.00 pack-year smoking history. She has never used smokeless tobacco. She  reports current alcohol use of about 2.0 standard drinks of alcohol per week. She reports that she does not use drugs.   Family History:  The patient's family history includes Ovarian cancer in her mother.    ROS:  Please see the history of present illness.   Otherwise, review of systems are positive for none.   All other systems are reviewed and negative.    PHYSICAL EXAM: VS:  BP (!) 143/74   Pulse (!) 118   Temp (!) 96.5 F (35.8 C)   Ht 5' (1.524 m)   Wt 171 lb 12.8 oz (77.9 kg)   SpO2 98%   BMI 33.55 kg/m  , BMI Body mass index  is 33.55 kg/m. GEN: Well nourished, well developed, in no acute distress  HEENT: normal  Neck: no JVD, carotid bruits, or masses Cardiac: RRR; no murmurs, rubs, or gallops,no edema  Respiratory:  clear to auscultation bilaterally, normal work of breathing GI: soft, nontender, nondistended, + BS MS: no deformity or atrophy  Skin: warm and dry, no rash Neuro:  Strength and sensation are intact Psych: euthymic mood, full affect Vascular: Femoral pulses are +1 bilaterally.  Distal pulses are not palpable   EKG:  EKG is not ordered today. EKG showed sinus tachycardia with poor R wave progression in the anterior leads.  No significant ST or T wave changes.  Recent Labs: 09/09/2018: ALT 22 11/18/2018: BUN 14; Creatinine, Ser 1.05; Hemoglobin 11.6; Platelets 292; Potassium 3.5; Sodium 138    Lipid Panel No results found for: CHOL, TRIG, HDL, CHOLHDL, VLDL, LDLCALC, LDLDIRECT    Wt Readings from Last 3 Encounters:  08/18/19 171 lb 12.8 oz (77.9 kg)  05/27/19 173 lb (78.5 kg)  05/19/19 177 lb (80.3 kg)       No flowsheet data found.    ASSESSMENT AND PLAN:  1. Peripheral arterial disease: Severe right calf claudication.  Only partial improvement with cilostazol and she continues to feel significantly limited by this.  She wants some form of revascularization performed in order to improve her functional capacity and potentially have surgery  done to improve plantar fasciitis.   I reviewed her angiogram done in July 2019 with her.  She has medium length occlusion in the right SFA and the whole vessel proximally is diffusely diseased including the ostium.  Endovascular intervention is possible and I did explain with her the limitations of her procedure and possible need for intervention even with successful revascularization.  I discussed all the risks associated with the procedure and she wants to proceed.  2.  Tobacco use: She cut down to 1 cigarette a day.  3.  Hyperlipidemia: Continue atorvastatin with a target LDL of less than 70.  4.  Essential hypertension: Blood pressure is mildly elevated but she reports whitecoat syndrome.   Disposition:   FU with me in 6 weeks  Signed,  Kathlyn Sacramento, MD  08/18/2019 5:38 PM    [place]

## 2019-08-18 NOTE — Patient Instructions (Signed)
Medication Instructions:  Your physician recommends that you continue on your current medications as directed. Please refer to the Current Medication list given to you today.  *If you need a refill on your cardiac medications before your next appointment, please call your pharmacy*  Lab Work: BMET/CBC 1 WEEK PRIOR TO PROCEDURE   YOU WILL NEED TO GO FOR COVID SCREENING AT [facility] ON 09/12/2019 AT 9:40 AM   If you have labs (blood work) drawn today and your tests are completely normal, you will receive your results only by: Marland Kitchen MyChart Message (if you have MyChart) OR . A paper copy in the mail If you have any lab test that is abnormal or we need to change your treatment, we will call you to review the results.  Testing/Procedures: PROCEDURE AT [facility] 09/16/2019  Follow-Up: DR ARIDA ON 10/06/2019 AT 8:00 AM   Other Instructions      [facility] [address] [place] [address] Dept: [phone] Loc: [place]  08/18/2019  You are scheduled for a Peripheral Angiogram on Wednesday, January 6 with Dr. Kathlyn Sacramento.  1. Please arrive at the [facility] (Main Entrance A) at [facility]: [address] [place], [place] [address] at 6:30 AM (This time is two hours before your procedure to ensure your preparation). Free valet parking service is available.   Special note: Every effort is made to have your procedure done on time. Please understand that emergencies sometimes delay scheduled procedures.  2. Diet: Do not eat solid foods after midnight.  The patient may have clear liquids until 5am upon the day of the procedure.  3. Labs: You will need to have blood drawn on WITHIN 1 WEEK OF PROCEDURE   4. Medication instructions in preparation for your procedure:   Contrast Allergy: No  On the morning of your procedure, take your Aspirin and any  morning medicines NOT listed above.  You may use sips of water.  5. Plan for one night stay--bring personal belongings. 6. Bring a current list of your medications and current insurance cards. 7. You MUST have a responsible person to drive you home. 8. Someone MUST be with you the first 24 hours after you arrive home or your discharge will be delayed. 9. Please wear clothes that are easy to get on and off and wear slip-on shoes.  Thank you for allowing [place] to care for you!   -- [facility]

## 2019-08-21 ENCOUNTER — Telehealth: Payer: Self-pay | Admitting: Cardiovascular Disease

## 2019-08-21 ENCOUNTER — Other Ambulatory Visit: Payer: Self-pay

## 2019-08-21 MED ORDER — CILOSTAZOL 100 MG PO TABS: 100.0000 mg | ORAL_TABLET | Freq: Two times a day (BID) | ORAL | 3 refills | Status: DC

## 2019-08-21 NOTE — Telephone Encounter (Signed)
New Message   *STAT* If patient is at the pharmacy, call can be transferred to refill team.   1. Which medications need to be refilled? (please list name of each medication and dose if known) cilostazol (PLETAL) 100 MG tablet  2. Which pharmacy/location (including street and city if local pharmacy) is medication to be sent to? [place], [place]  3. Do they need a 30 day or 90 day supply? 90 day supply

## 2019-08-27 ENCOUNTER — Other Ambulatory Visit: Payer: Self-pay

## 2019-08-27 ENCOUNTER — Encounter: Payer: Self-pay | Admitting: Physical Medicine & Rehabilitation

## 2019-08-27 ENCOUNTER — Encounter: Payer: Medicare Other | Attending: Physical Medicine & Rehabilitation | Admitting: Physical Medicine & Rehabilitation

## 2019-08-27 VITALS — BP 134/84 | HR 115 | Temp 97.8°F | Ht 60.0 in | Wt 177.0 lb

## 2019-08-27 DIAGNOSIS — M542 Cervicalgia: Secondary | ICD-10-CM

## 2019-08-27 DIAGNOSIS — M5412 Radiculopathy, cervical region: Secondary | ICD-10-CM | POA: Insufficient documentation

## 2019-08-27 DIAGNOSIS — Z981 Arthrodesis status: Secondary | ICD-10-CM

## 2019-08-27 DIAGNOSIS — M47812 Spondylosis without myelopathy or radiculopathy, cervical region: Secondary | ICD-10-CM | POA: Diagnosis not present

## 2019-08-27 DIAGNOSIS — G8929 Other chronic pain: Secondary | ICD-10-CM | POA: Insufficient documentation

## 2019-08-27 DIAGNOSIS — M25511 Pain in right shoulder: Secondary | ICD-10-CM | POA: Diagnosis present

## 2019-08-27 DIAGNOSIS — G894 Chronic pain syndrome: Secondary | ICD-10-CM | POA: Diagnosis not present

## 2019-08-27 DIAGNOSIS — I739 Peripheral vascular disease, unspecified: Secondary | ICD-10-CM

## 2019-08-27 DIAGNOSIS — R000 Tachycardia, unspecified: Secondary | ICD-10-CM | POA: Diagnosis not present

## 2019-08-27 DIAGNOSIS — M792 Neuralgia and neuritis, unspecified: Secondary | ICD-10-CM

## 2019-08-27 DIAGNOSIS — M722 Plantar fascial fibromatosis: Secondary | ICD-10-CM

## 2019-08-27 DIAGNOSIS — Z720 Tobacco use: Secondary | ICD-10-CM

## 2019-08-27 DIAGNOSIS — F17200 Nicotine dependence, unspecified, uncomplicated: Secondary | ICD-10-CM

## 2019-08-27 NOTE — Progress Notes (Addendum)
Subjective:    Patient ID: Amy Bruce, female    DOB: 1950/12/12, 68 y.o.   MRN: [id number]  HPI  Female with pmh/psh ofHTN, anemia, B12 deficiency, rheumatic fever, cervical fusion present for follow up for general pain.   Initially stated: Present since fusion 1991, getting worse in last couple weeks.  She fractured her humerus in 2016 and states she was slinged.  No alleviating factors. Everything exacerbates the pain.  Sharp pain.  Radiates to hands.  She has associated numbness.  Ibu does not help. Narco, flexaril helps. Denies falls. Pain limits from doing ADLs. Pt notes she had similar symptoms on left side before first fusion. Pt relocated 12/2014 from [place], [place] where she was being followed by Pain clinic.    Last clinic visit 05/19/2019.  Since that time, overall, she has improvement in LUE pain, but still has neck and muscular pain. She continues to take Gabapentin, Flexaril, and Elavil. She is now smoking 1 cig/day. She is scheduled to have a vascular procedure in [place] in Jan. Plantar fascitis continues to cause issues.   Pain Inventory Average Pain 8 Pain Right Now 7 My pain is sharp and aching  In the last 24 hours, has pain interfered with the following? General activity 5 Relation with others 0 Enjoyment of life 6 What TIME of day is your pain at its worst? all Sleep (in general) Good  Pain is worse with: sitting and some activites Pain improves with: rest and medication Relief from Meds: 8  Mobility walk without assistance ability to climb steps?  yes do you drive?  yes  Function retired  Neuro/Psych numbness tingling  Prior Studies Any changes since last visit?  no  Physicians involved in your care Any changes since last visit?  no   No pertinent family history of neck/shoulder pain. Social History   Socioeconomic History  . Marital status: Divorced    Spouse name: Not on file  . Number of children: Not on file  . Years of education: Not  on file  . Highest education level: Not on file  Occupational History  . Not on file  Tobacco Use  . Smoking status: Current Every Day Smoker    Packs/day: 1.00    Years: 33.00    Pack years: 33.00    Types: Cigarettes  . Smokeless tobacco: Never Used  . Tobacco comment: 1 cigarettes per day  Substance and Sexual Activity  . Alcohol use: Yes    Alcohol/week: 2.0 standard drinks    Types: 2 Shots of liquor per week    Comment: 1-2 wk  . Drug use: No  . Sexual activity: Not on file  Other Topics Concern  . Not on file  Social History Narrative  . Not on file   Social Determinants of Health   Financial Resource Strain:   . Difficulty of Paying Living Expenses: Not on file  Food Insecurity:   . Worried About Charity fundraiser in the Last Year: Not on file  . Ran Out of Food in the Last Year: Not on file  Transportation Needs:   . Lack of Transportation (Medical): Not on file  . Lack of Transportation (Non-Medical): Not on file  Physical Activity:   . Days of Exercise per Week: Not on file  . Minutes of Exercise per Session: Not on file  Stress:   . Feeling of Stress : Not on file  Social Connections:   . Frequency of Communication with Friends  and Family: Not on file  . Frequency of Social Gatherings with Friends and Family: Not on file  . Attends Religious Services: Not on file  . Active Member of Clubs or Organizations: Not on file  . Attends Archivist Meetings: Not on file  . Marital Status: Not on file   Past surgical history: Cervical fusion. Past medical history: HTN, anemia, B12 deficiency, rheumatic fever, chronic pain BP 134/84   Pulse (!) 115   Temp 97.8 F (36.6 C)   Ht 5' (1.524 m)   Wt 177 lb (80.3 kg)   SpO2 97%   BMI 34.57 kg/m   Opioid Risk Score:   Fall Risk Score:  `1  Depression screen PHQ 2/9  Depression screen [facility] 2/9 11/20/2018 12/12/2016 11/08/2016 10/11/2016  Decreased Interest 0 0 0 0  Down, Depressed, Hopeless 0 0 0 0  PHQ  - 2 Score 0 0 0 0  Altered sleeping - - - 0  Tired, decreased energy - - - 0  Change in appetite - - - 0  Feeling bad or failure about yourself  - - - 0  Trouble concentrating - - - 0  Moving slowly or fidgety/restless - - - 0  Suicidal thoughts - - - 0  PHQ-9 Score - - - 0  Difficult doing work/chores - - - Not difficult at all   Review of Systems  Constitutional: Negative.        Weight gain in relatively short amount of time  HENT: Negative.   Eyes: Negative.   Respiratory: Negative.   Cardiovascular: Negative.   Gastrointestinal: Negative.   Endocrine: Negative.   Genitourinary: Negative.   Musculoskeletal: Positive for arthralgias, back pain, gait problem, myalgias, neck pain and neck stiffness.       Spasms  Skin:       Pruritis  Allergic/Immunologic: Negative.   Neurological: Positive for weakness and numbness.       Tingling   Hematological: Negative.   Psychiatric/Behavioral: Negative.       Objective:   Physical Exam  Constitutional: No distress . Vital signs reviewed. HENT: Normocephalic.  Atraumatic. Eyes: EOMI. No discharge. Cardiovascular: No JVD. Respiratory: Normal effort.  No stridor. GI: Non-distended. Skin: Warm and dry.  Intact. Psych: Normal mood.  Normal behavior. Musc: Right shoulder and left neck TTP. Neuro: Alert             Strength 4/5 in all UE myotomes (pain inhibition), improving Skin: Warm and Dry. Intact    Assessment & Plan:  Female with pmh/psh of  HTN, anemia, B12 deficiency, rheumatic fever, cervical fusion present for follow up of general pain.    1. 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  Per report canal stenosis as well as cord compression at C4-C5.  Per report 1. 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 Severe right and moderately severe left foraminal narrowing are also seen at this level. 2. 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 Mild to moderate foraminal narrowing at this level is worse on the right. 3. Facet arthropathy and uncovertebral disease at C7-T1 cause severe left and moderately severe right foraminal narrowing. The central canal is patent. 4. Status post C5-7 fusion.  The central canal  and foramina are open.  Now s/p Left C3-5 discectomy and fusion  Previous NCS/EMG showing right subacute/chronic C5 radic.   No benefit with Medrol dose pack  Heat/Cold, Baclofen, Lidoderm patch, Mobic ineffective, unable to tolerate Cymbalta  Xray reviewed, showing mild chronic changes  Not cleared for HEP by Neurosurg  Encouraged TENS again  Cont Gabapentin 1200 TID  Cont diclofenac 50 TID, may continue per Vascular   Cont Flexaril 10 TID   Trial wean Elavil to 25 qhs to see if associated with HR    2. Myalgia and muscle spasms  Will consider Trigger point injections in future, does not want at present.    See #1  3. Right CTS  See #1  Cont brace qhs  4. Tobacco abuse  Smoking 1 cig/day  Cont to follow up with PCP for gum/meds/etc  Encourage cessation again  5. Hx of post herpatic neuralgia  See #1  6. PVD  Limiting mobility  B/l LE  Cont follow up with Vascular - plan for stenting next month  7. Plantar fascitis  Cont follow up with podiatry  Encourage stretching, frozen bottle, bracing  Retrial TENS  8. Tachycardia  Cont follow up with Cardiology  Trial wean of Elavil

## 2019-09-05 ENCOUNTER — Other Ambulatory Visit: Payer: Self-pay | Admitting: Physical Medicine & Rehabilitation

## 2019-09-10 ENCOUNTER — Encounter (HOSPITAL_COMMUNITY): Payer: Self-pay | Admitting: *Deleted

## 2019-09-10 ENCOUNTER — Other Ambulatory Visit: Payer: Self-pay

## 2019-09-10 ENCOUNTER — Emergency Department (HOSPITAL_COMMUNITY)
Admission: EM | Admit: 2019-09-10 | Discharge: 2019-09-10 | Disposition: A | Discharge status: Home / Self Care | Payer: Medicare Other | Attending: Emergency Medicine | Admitting: Emergency Medicine

## 2019-09-10 DIAGNOSIS — Z7982 Long term (current) use of aspirin: Secondary | ICD-10-CM | POA: Diagnosis not present

## 2019-09-10 DIAGNOSIS — N3000 Acute cystitis without hematuria: Secondary | ICD-10-CM | POA: Insufficient documentation

## 2019-09-10 DIAGNOSIS — I10 Essential (primary) hypertension: Secondary | ICD-10-CM | POA: Diagnosis not present

## 2019-09-10 DIAGNOSIS — R8290 Unspecified abnormal findings in urine: Secondary | ICD-10-CM | POA: Diagnosis present

## 2019-09-10 DIAGNOSIS — F17210 Nicotine dependence, cigarettes, uncomplicated: Secondary | ICD-10-CM | POA: Diagnosis not present

## 2019-09-10 DIAGNOSIS — Z79899 Other long term (current) drug therapy: Secondary | ICD-10-CM | POA: Insufficient documentation

## 2019-09-10 DIAGNOSIS — N12 Tubulo-interstitial nephritis, not specified as acute or chronic: Secondary | ICD-10-CM

## 2019-09-10 HISTORY — DX: Tubulo-interstitial nephritis, not specified as acute or chronic: N12

## 2019-09-10 LAB — COMPREHENSIVE METABOLIC PANEL
ALT: 18 U/L (ref 0–44)
AST: 16 U/L (ref 15–41)
Albumin: 3.7 g/dL (ref 3.5–5.0)
Alkaline Phosphatase: 86 U/L (ref 38–126)
Anion gap: 12 (ref 5–15)
BUN: 10 mg/dL (ref 8–23)
CO2: 25 mmol/L (ref 22–32)
Calcium: 9.5 mg/dL (ref 8.9–10.3)
Chloride: 100 mmol/L (ref 98–111)
Creatinine, Ser: 1.02 mg/dL — ABNORMAL HIGH (ref 0.44–1.00)
GFR calc Af Amer: >60 mL/min (ref >60)
GFR calc non Af Amer: 56 mL/min — ABNORMAL LOW (ref >60)
Glucose, Bld: 115 mg/dL — ABNORMAL HIGH (ref 70–99)
Potassium: 4.2 mmol/L (ref 3.5–5.1)
Sodium: 137 mmol/L (ref 135–145)
Total Bilirubin: 0.5 mg/dL (ref 0.3–1.2)
Total Protein: 7.6 g/dL (ref 6.5–8.1)

## 2019-09-10 LAB — BLOOD CULTURE ID PANEL (REFLEXED)

## 2019-09-10 LAB — CBC
HCT: 36.8 % (ref 36.0–46.0)
Hemoglobin: 12.3 g/dL (ref 12.0–15.0)
MCH: 29.9 pg (ref 26.0–34.0)
MCHC: 33.4 g/dL (ref 30.0–36.0)
MCV: 89.3 fL (ref 80.0–100.0)
Platelets: 208 10*3/uL (ref 150–400)
RBC: 4.12 MIL/uL (ref 3.87–5.11)
RDW: 15.1 % (ref 11.5–15.5)
WBC: 11.8 10*3/uL — ABNORMAL HIGH (ref 4.0–10.5)
nRBC: 0 % (ref 0.0–0.2)

## 2019-09-10 LAB — URINALYSIS, ROUTINE W REFLEX MICROSCOPIC
Bilirubin Urine: NEGATIVE
Glucose, UA: NEGATIVE mg/dL
Ketones, ur: NEGATIVE mg/dL
Nitrite: POSITIVE — AB
Protein, ur: NEGATIVE mg/dL
Specific Gravity, Urine: 1.005 (ref 1.005–1.030)
WBC, UA: >50 WBC/hpf — ABNORMAL HIGH (ref 0–5)
pH: 6 (ref 5.0–8.0)

## 2019-09-10 LAB — LIPASE, BLOOD: Lipase: 18 U/L (ref 11–51)

## 2019-09-10 LAB — LACTIC ACID, PLASMA: Lactic Acid, Venous: 1.2 mmol/L (ref 0.5–1.9)

## 2019-09-10 MED ORDER — CEPHALEXIN 500 MG PO CAPS: 500.0000 mg | ORAL_CAPSULE | Freq: Three times a day (TID) | ORAL | 0 refills | Status: AC

## 2019-09-10 MED ORDER — SODIUM CHLORIDE 0.9 % IV SOLN
2.0000 g | Freq: Once | INTRAVENOUS | Status: AC
  Administered 2019-09-10: 2 g via INTRAVENOUS
  Filled 2019-09-10: qty 20

## 2019-09-10 MED ORDER — ONDANSETRON 4 MG PO TBDP: 4.0000 mg | ORAL_TABLET | Freq: Three times a day (TID) | ORAL | 0 refills | Status: AC | PRN

## 2019-09-10 MED ORDER — SODIUM CHLORIDE 0.9 % IV BOLUS (SEPSIS)
1000.0000 mL | Freq: Once | INTRAVENOUS | Status: AC
  Administered 2019-09-10: 1000 mL via INTRAVENOUS

## 2019-09-10 MED ORDER — ACETAMINOPHEN 325 MG PO TABS
650.0000 mg | ORAL_TABLET | Freq: Once | ORAL | Status: AC
  Administered 2019-09-10: 650 mg via ORAL
  Filled 2019-09-10: qty 2

## 2019-09-10 MED ORDER — SODIUM CHLORIDE 0.9% FLUSH
3.0000 mL | Freq: Once | INTRAVENOUS | Status: AC
  Administered 2019-09-10: 3 mL via INTRAVENOUS

## 2019-09-10 MED ORDER — SODIUM CHLORIDE 0.9 % IV SOLN
1000.0000 mL | INTRAVENOUS | Status: DC
  Administered 2019-09-10: 1000 mL via INTRAVENOUS

## 2019-09-10 MED ORDER — SODIUM CHLORIDE 0.9 % IV BOLUS: 500.0000 mL | Freq: Once | INTRAVENOUS | Status: DC

## 2019-09-10 NOTE — ED Triage Notes (Signed)
The pt reports that  She thinks she has a uti  She has had a temperature and her urine is very cloudy  And dark in color  She arrived by [facility] amb[facility]

## 2019-09-10 NOTE — ED Notes (Signed)
Pt dc'd home w/all belongings, a/o x4, called [employer] for a ride home

## 2019-09-10 NOTE — Discharge Instructions (Addendum)
Take the medications as prescribed, follow-up with your primary care doctor to be rechecked, return to the ER if you start having worsening symptoms

## 2019-09-10 NOTE — ED Provider Notes (Signed)
Patient seen by Dr. Leonette Monarch.  Please see his note.  Urinalysis is consistent with urinary tract infection.  Plan was for discharge home with antibiotic   Dorie Rank, MD 09/10/19 [phone]

## 2019-09-10 NOTE — ED Provider Notes (Signed)
[facility] EMERGENCY DEPARTMENT Provider Note  CSN: [id number] Arrival date & time: 09/10/19 [id number]  Chief Complaint(s) possible uti  HPI Amy Bruce is a 68 y.o. female with past medical history listed below including recurrent urinary tract infections presents to the emergency department with several days of dark cloudy urine who developed a fever last night.  T-max was 102.  Patient denies any dysuria but endorses frequency.  No abdominal pain.  No nausea or vomiting.  No diarrhea.  No other infectious symptoms including cough or congestion.  HPI  Past Medical History Past Medical History:  Diagnosis Date  . Anemia    in the past  . Anxiety   . Arthritis   . 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 Fibromyalgia   . GERD (gastroesophageal reflux disease)   . Heart murmur    due to rheumatic fever, PCP no longer hears it  . Hyperlipidemia   . Hypertension   . Myalgia   . Neuropathy   . PAD (peripheral artery disease) ([place])   . PAD (peripheral artery disease) ([place])   . Pre-diabetes   . Pyelonephritis 09/10/2019    "septic"  . Tobacco abuse   . 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 Neuropathic pain 08/27/2019  . Facet arthropathy, cervical 05/19/2019  . Tobacco abuse 04/08/2019  . Cervical radiculopathy at C5 01/15/2019  . Chronic scapular pain 12/18/2018  . Chronic right shoulder pain 10/17/2018  . Pyelonephritis 09/10/2018  . PAD (peripheral artery disease) ([place])   . Hypertension   . Chronic pain syndrome 10/11/2016  . Right shoulder pain 10/11/2016  . Neck pain, chronic 10/11/2016  . Numbness and tingling in right hand 10/11/2016  . Myalgia 10/11/2016   Home Medication(s) Prior to Admission medications   Medication Sig Start Date End Date Taking? Authorizing Provider  albuterol (VENTOLIN HFA) 108 (90 Base) MCG/ACT inhaler Inhale 1-2 puffs into the  lungs every 4 (four) hours as needed for wheezing. 06/15/19   Magdalen Spatz, NP  amitriptyline (ELAVIL) 25 MG tablet TAKE 1 TABLET(25 MG) BY MOUTH AT BEDTIME Patient taking differently: Take 25 mg by mouth at bedtime.  08/10/19   Jamse Arn, MD  aspirin EC 81 MG tablet Take 81 mg by mouth daily.    [provider]  atorvastatin (LIPITOR) 40 MG tablet Take 40 mg by mouth daily.  06/07/19   [provider]  calcium carbonate (TUMS - DOSED IN MG ELEMENTAL CALCIUM) 500 MG chewable tablet Chew 2 tablets by mouth daily as needed for indigestion or heartburn.     [provider]  Camphor-Eucalyptus-Menthol (VICKS VAPORUB EX) Apply 1 application topically daily as needed (congestion).    [provider]  cephALEXin (KEFLEX) 500 MG capsule Take 1 capsule (500 mg total) by mouth 3 (three) times daily for 10 days. 09/10/19 09/20/19  Fatima Blank, MD  cetirizine (ZYRTEC) 10 MG tablet Take 10 mg by mouth daily as needed for allergies.    [provider]  cilostazol (PLETAL) 100 MG tablet Take 1 tablet (100 mg total) by mouth 2 (two) times daily. 08/21/19   Wellington Hampshire, MD  cyclobenzaprine (FLEXERIL) 10 MG tablet Take 1 tablet (10 mg total) by mouth 3 (three) times daily as needed for muscle spasms. 04/08/19   Jamse Arn, MD  diclofenac (VOLTAREN) 50 MG  EC tablet TAKE 1 TABLET(50 MG) BY MOUTH THREE TIMES DAILY Patient taking differently: Take 50 mg by mouth 3 (three) times daily.  09/07/19   Jamse Arn, MD  fluconazole (DIFLUCAN) 150 MG tablet Take 150 mg by mouth once.  02/20/19   [provider]  gabapentin (NEURONTIN) 600 MG tablet TAKE 2 TABLETS(1200 MG) BY MOUTH THREE TIMES DAILY Patient taking differently: Take 1,200 mg by mouth 3 (three) times daily.  07/10/19   Patel, Domenick Bookbinder, MD  lidocaine (LIDODERM) 5 % UNWRAP AND APPLY 1 PATCH TO SKIN DAILY. REMOVE AND DISCARD PATCH WITHIN 12 HOURS OR AS DIRECTED BY MD. Patient  taking differently: Place 1 patch onto the skin daily as needed (for pain).  01/23/18   Jamse Arn, MD  lisinopril (ZESTRIL) 20 MG tablet Take 1 tablet (20 mg total) by mouth daily. Patient not taking: Reported on 08/18/2019 04/21/19 08/18/19  Wellington Hampshire, MD  nicotine (NICODERM CQ - DOSED IN MG/24 HOURS) 21 mg/24hr patch Place 21 mg onto the skin daily as needed (smoking cessation).    [provider]  nicotine polacrilex (NICORETTE) 2 MG lozenge Take 2 mg by mouth as needed for smoking cessation.    [provider]  omeprazole (PRILOSEC) 20 MG capsule Take 20 mg by mouth daily.     [provider]  ondansetron (ZOFRAN ODT) 4 MG disintegrating tablet Take 1 tablet (4 mg total) by mouth every 8 (eight) hours as needed for up to 3 days for nausea or vomiting. 09/10/19 09/13/19  [name], [name], MD  oxyCODONE-acetaminophen (PERCOCET) 7.5-325 MG tablet Take 1 tablet by mouth every 4 (four) hours as needed for severe pain.    [provider]  vitamin B-12 (CYANOCOBALAMIN) 500 MCG tablet Take 500 mcg by mouth daily.    [provider]                                                                                                                                    Past Surgical History Past Surgical History:  Procedure Laterality Date  . ABDOMINAL AORTOGRAM W/LOWER EXTREMITY N/A 04/02/2018   Procedure: ABDOMINAL AORTOGRAM W/LOWER EXTREMITY;  Surgeon: Wellington Hampshire, MD;  Location: [place] [facility];  Service: Cardiovascular;  Laterality: N/A;  . ABDOMINAL HYSTERECTOMY  1999  . [place]   C5/6 6/7  . COLONOSCOPY    . ECTOPIC PREGNANCY SURGERY    . Left axillary cyst removal 1989    . MULTIPLE EXTRACTIONS WITH ALVEOLOPLASTY N/A 10/25/2017   Procedure: EXTRACTION NUMBERS FIVE, SEVEN, EIGHT, NINE, TEN, FIFTEEN, EIGHTEEN WITH ALVEOLOPLASTY, IRRIGATION AND DEBRIDEMENT LEFT SUBMANDIBULAR INFECTION;  Surgeon: Diona Browner, DDS;   Location: [place];  Service: Oral Surgery;  Laterality: N/A;  . RADIOLOGY WITH ANESTHESIA N/A 11/18/2018   Procedure: MRI OF THE CERVICAL SPINE WITHOUT CONTRAST;  Surgeon: Radiologist, Medication, MD;  Location: [place];  Service:  Radiology;  Laterality: N/A;  . TONSILECTOMY, ADENOIDECTOMY, BILATERAL MYRINGOTOMY AND TUBES  1981   put not aware of tubes being put in ears  . TUBAL LIGATION  1981   Family History Family History  Problem Relation Age of Onset  . Ovarian cancer Mother     Social History Social History   Tobacco Use  . Smoking status: Current Every Day Smoker    Packs/day: 1.00    Years: 33.00    Pack years: 33.00    Types: Cigarettes  . Smokeless tobacco: Never Used  . Tobacco comment: 1 cigarettes per day  Substance Use Topics  . Alcohol use: Yes    Alcohol/week: 2.0 standard drinks    Types: 2 Shots of liquor per week    Comment: 1-2 wk  . Drug use: No   Allergies Carisoprodol and Duloxetine hcl  Review of Systems Review of Systems All other systems are reviewed and are negative for acute change except as noted in the HPI  Physical Exam Vital Signs  I have reviewed the triage vital signs BP (!) 123/56 (BP Location: Right Arm)   Pulse (!) 124   Temp 99.7 F (37.6 C) (Oral)   Resp 17   Ht 5' (1.524 m)   Wt 74.4 kg   SpO2 98%   BMI 32.03 kg/m   Physical Exam Vitals reviewed.  Constitutional:      General: She is not in acute distress.    Appearance: She is well-developed. She is not diaphoretic.  HENT:     Head: Normocephalic and atraumatic.     Nose: Nose normal.     Mouth/Throat:     Mouth: Mucous membranes are dry.  Eyes:     General: No scleral icterus.       Right eye: No discharge.        Left eye: No discharge.     Conjunctiva/sclera: Conjunctivae normal.     Pupils: Pupils are equal, round, and reactive to light.  Cardiovascular:     Rate and Rhythm: Regular rhythm. Tachycardia present.     Heart sounds: No murmur. No friction rub.  No gallop.   Pulmonary:     Effort: Pulmonary effort is normal. No respiratory distress.     Breath sounds: Normal breath sounds. No stridor. No rales.  Abdominal:     General: There is no distension.     Palpations: Abdomen is soft.     Tenderness: There is no abdominal tenderness.  Musculoskeletal:        General: No tenderness.     Cervical back: Normal range of motion and neck supple.  Skin:    General: Skin is warm and dry.     Findings: No erythema or rash.  Neurological:     Mental Status: She is alert and oriented to person, place, and time.     ED Results and Treatments Labs (all labs ordered are listed, but only abnormal results are displayed) Labs Reviewed  COMPREHENSIVE METABOLIC PANEL - Abnormal; Notable for the following components:      Result Value   Glucose, Bld 115 (*)    Creatinine, Ser 1.02 (*)    GFR calc non Af Amer 56 (*)    All other components within normal limits  CBC - Abnormal; Notable for the following components:   WBC 11.8 (*)    All other components within normal limits  URINALYSIS, ROUTINE W REFLEX MICROSCOPIC - Abnormal; Notable for the following components:   APPearance CLOUDY (*)  Hgb urine dipstick MODERATE (*)    Nitrite POSITIVE (*)    Leukocytes,Ua LARGE (*)    WBC, UA >50 (*)    Bacteria, UA MANY (*)    All other components within normal limits  CULTURE, BLOOD (ROUTINE X 2)  CULTURE, BLOOD (ROUTINE X 2)  LIPASE, BLOOD  LACTIC ACID, PLASMA                                                                                                                         EKG  EKG Interpretation  Date/Time:  Thursday September 10 2019 03:44:09 EST Ventricular Rate:  130 PR Interval:  138 QRS Duration: 74 QT Interval:  302 QTC Calculation: 444 R Axis:   102 Text Interpretation: Sinus tachycardia Right atrial enlargement Rightward axis Nonspecific ST abnormality Abnormal ECG No old tracing to compare Confirmed by Addison Lank [phone]) on  09/10/2019 4:43:54 AM      Radiology No results found.  Pertinent labs & imaging results that were available during my care of the patient were reviewed by me and considered in my medical decision making (see chart for details).  Medications Ordered in ED Medications  sodium chloride 0.9 % bolus 1,000 mL (0 mLs Intravenous Stopped 09/10/19 0628)    Followed by  0.9 %  sodium chloride infusion (0 mLs Intravenous Stopped 09/10/19 0821)  sodium chloride 0.9 % bolus 500 mL (0 mLs Intravenous Stopped 09/10/19 0901)  sodium chloride flush (NS) 0.9 % injection 3 mL (3 mLs Intravenous Given 09/10/19 0629)  acetaminophen (TYLENOL) tablet 650 mg (650 mg Oral Given 09/10/19 0401)  cefTRIAXone (ROCEPHIN) 2 g in sodium chloride 0.9 % 100 mL IVPB (0 g Intravenous Stopped 09/10/19 [id number])                                                                                                                                    Procedures Procedures  (including critical care time)  Medical Decision Making / ED Course I have reviewed the nursing notes for this encounter and the patient's prior records (if available in EHR or on provided paperwork).   NOVALI SEABOURN was evaluated in Emergency Department on 09/10/2019 for the symptoms described in the history of present illness. She was evaluated in the context of the global COVID-19 pandemic, which necessitated consideration that the patient might be at risk for infection with the SARS-CoV-2 virus that causes  COVID-19. Institutional protocols and algorithms that pertain to the evaluation of patients at risk for COVID-19 are in a state of rapid change based on information released by regulatory bodies including the CDC and federal and state organizations. These policies and algorithms were followed during the patient's care in the ED.  Work-up consistent with urinary tract infection.  Patient was tachycardic and appeared dehydrated.  Lactic acid was within normal  limits.  She was treated with IV fluids and IV Rocephin.  She has significant improvement in her heart rate.  She was able to tolerate oral intake.  Given her significant improvement, feel she would be appropriate for outpatient management.  She was however given strict return precautions.  The patient appears reasonably screened and/or stabilized for discharge and I doubt any other medical condition or other [facility] requiring further screening, evaluation, or treatment in the ED at this time prior to discharge.  The patient is safe for discharge with strict return precautions.       Final Clinical Impression(s) / ED Diagnoses Final diagnoses:  Acute cystitis without hematuria    The patient appears reasonably screened and/or stabilized for discharge and I doubt any other medical condition or other [facility] requiring further screening, evaluation, or treatment in the ED at this time prior to discharge.  Disposition: Discharge  Condition: Good  I have discussed the results, Dx and Tx plan with the patient who expressed understanding and agree(s) with the plan. Discharge instructions discussed at great length. The patient was given strict return precautions who verbalized understanding of the instructions. No further questions at time of discharge.    ED Discharge Orders         Ordered    cephALEXin (KEFLEX) 500 MG capsule  3 times daily     09/10/19 0803    ondansetron (ZOFRAN ODT) 4 MG disintegrating tablet  Every 8 hours PRN     09/10/19 0803          Follow Up: London Pepper, MD [address] [place] [address] [phone]  Schedule an appointment as soon as possible for a visit  As needed  [place] [address] [id number] [place] [phone] Go to  If you develop extreme fatigue, intractable nausea that is not relieved with your nausea medicine, inability to tolerate  oral intake or your oral antibiotic.     This chart was dictated using voice recognition software.  Despite best efforts to proofread,  errors can occur which can change the documentation meaning.   Fatima Blank, MD 09/10/19 [phone]

## 2019-09-11 ENCOUNTER — Telehealth (HOSPITAL_BASED_OUTPATIENT_CLINIC_OR_DEPARTMENT_OTHER): Payer: Self-pay | Admitting: *Deleted

## 2019-09-11 ENCOUNTER — Telehealth (HOSPITAL_BASED_OUTPATIENT_CLINIC_OR_DEPARTMENT_OTHER): Payer: Self-pay | Admitting: Emergency Medicine

## 2019-09-12 ENCOUNTER — Inpatient Hospital Stay (HOSPITAL_COMMUNITY): Admission: RE | Admit: 2019-09-12 | Payer: Medicare Other | Source: Ambulatory Visit

## 2019-09-12 LAB — CULTURE, BLOOD (ROUTINE X 2): Special Requests: ADEQUATE

## 2019-09-13 ENCOUNTER — Other Ambulatory Visit: Payer: Self-pay | Admitting: Physical Medicine & Rehabilitation

## 2019-09-14 ENCOUNTER — Telehealth: Payer: Self-pay | Admitting: Cardiovascular Disease

## 2019-09-14 NOTE — Telephone Encounter (Signed)
The patient stated that she was recently in the ED for an infection and a 103 fever. She was unable to get her covid test completed for her procedure on 1/6 with Dr. Fletcher Anon.   She is currently on antibiotics until Sunday.   She would like to postpone her procedure until 1/13. The procedure has been moved.

## 2019-09-14 NOTE — Telephone Encounter (Signed)
The patient has been made aware that the procedure has been moved to 1/13 at 8:30. She will have the covid test on 09/19/2019.

## 2019-09-14 NOTE — Telephone Encounter (Signed)
Patient calling about her procedure on 1/6. She states she is on antibiotics since last Thursday 12/31 for a bladder infection and missed her lab appointment Saturday 1/2. She wants to know if the procedure needs to be rescheduled because of this.

## 2019-09-14 NOTE — Telephone Encounter (Signed)
Ok thanks 

## 2019-09-15 LAB — CULTURE, BLOOD (ROUTINE X 2)
Culture: NO GROWTH
Special Requests: ADEQUATE

## 2019-09-19 ENCOUNTER — Other Ambulatory Visit (HOSPITAL_COMMUNITY): Payer: Medicare Other

## 2019-09-21 ENCOUNTER — Telehealth: Payer: Self-pay | Admitting: *Deleted

## 2019-09-21 NOTE — Telephone Encounter (Signed)
New message   Patient has questions about instructions for procedure on Wednesday. Please call.

## 2019-09-21 NOTE — Telephone Encounter (Signed)
Pt contacted pre-catheterization scheduled at [facility] for: Wednesday September 23, 2019 8:30 AM   [facility] to see if patient had pre procedure Covid-19 test done 09/19/19.

## 2019-09-21 NOTE — Telephone Encounter (Addendum)
Pt contacted pre-abdominal aortogram scheduled at [facility] for: Wednesday September 23, 2019 8:30 AM Verified arrival time and place: [facility]) at: 6:30 AM   No solid food after midnight prior to cath, clear liquids until 5 AM day of procedure. Contrast allergy: no   AM meds can be  taken pre-cath with sip of water including: ASA 81 mg   Confirmed patient has responsible adult to drive home post procedure and observe 24 hours after arriving home: yes  Currently, due to Covid-19 pandemic, only one support person will be allowed with patient. Must be the same support person for that patient's entire stay, will be screened and required to wear a mask. They will be asked to wait in the waiting room for the duration of the patient's stay.  Patients are required to wear a mask when they enter the hospital.      COVID-19 Pre-Screening Questions:  . In the past 7 to 10 days have you had a cough,  shortness of breath, headache, congestion, fever (100 or greater) body aches, chills, sore throat, or sudden loss of taste or sense of smell? no . Have you been around anyone with known Covid 19? no . Have you been around anyone who is awaiting Covid 19 test results in the past 7 to 10 days? no . Have you been around anyone who has been exposed to Covid 19, or has mentioned symptoms of Covid 19 within the past 7 to 10 days? no  I reviewed procedure/mask/visitor instructions, Covid-19 screening questions with patient, she verbalized understanding, thanked me for call. Pt states she was unable to make appointment for pre procedure Covid-19 test 09/19/19,I have rescheduled Covid-19 test to 09/22/19 8:20 AM. Pt knows procedure will need to be rescheduled if she is unable to get Covid-19 test before procedure.

## 2019-09-22 ENCOUNTER — Other Ambulatory Visit (HOSPITAL_COMMUNITY)
Admission: RE | Admit: 2019-09-22 | Discharge: 2019-09-22 | Disposition: A | Discharge status: Home / Self Care | Payer: Medicare Other | Source: Ambulatory Visit | Attending: Cardiovascular Disease | Admitting: Cardiovascular Disease

## 2019-09-22 DIAGNOSIS — Z20822 Contact with and (suspected) exposure to covid-19: Secondary | ICD-10-CM | POA: Insufficient documentation

## 2019-09-22 DIAGNOSIS — Z01812 Encounter for preprocedural laboratory examination: Secondary | ICD-10-CM | POA: Diagnosis present

## 2019-09-22 LAB — SARS CORONAVIRUS 2 (TAT 6-24 HRS): SARS Coronavirus 2: NEGATIVE

## 2019-09-23 ENCOUNTER — Ambulatory Visit (HOSPITAL_COMMUNITY)
Admission: RE | Disposition: A | Discharge status: Home / Self Care | Payer: Medicare Other | Source: Ambulatory Visit | Attending: Cardiovascular Disease

## 2019-09-23 ENCOUNTER — Other Ambulatory Visit: Payer: Self-pay

## 2019-09-23 ENCOUNTER — Other Ambulatory Visit: Payer: Self-pay | Admitting: *Deleted

## 2019-09-23 ENCOUNTER — Ambulatory Visit (HOSPITAL_COMMUNITY)
Admission: RE | Admit: 2019-09-23 | Discharge: 2019-09-23 | Disposition: A | Discharge status: Home / Self Care | Payer: Medicare Other | Source: Ambulatory Visit | Attending: Cardiovascular Disease | Admitting: Cardiovascular Disease

## 2019-09-23 DIAGNOSIS — I10 Essential (primary) hypertension: Secondary | ICD-10-CM | POA: Insufficient documentation

## 2019-09-23 DIAGNOSIS — E785 Hyperlipidemia, unspecified: Secondary | ICD-10-CM | POA: Diagnosis not present

## 2019-09-23 DIAGNOSIS — Z79899 Other long term (current) drug therapy: Secondary | ICD-10-CM | POA: Diagnosis not present

## 2019-09-23 DIAGNOSIS — M797 Fibromyalgia: Secondary | ICD-10-CM | POA: Insufficient documentation

## 2019-09-23 DIAGNOSIS — F419 Anxiety disorder, unspecified: Secondary | ICD-10-CM | POA: Insufficient documentation

## 2019-09-23 DIAGNOSIS — Z888 Allergy status to other drugs, medicaments and biological substances status: Secondary | ICD-10-CM | POA: Insufficient documentation

## 2019-09-23 DIAGNOSIS — Z791 Long term (current) use of non-steroidal anti-inflammatories (NSAID): Secondary | ICD-10-CM | POA: Diagnosis not present

## 2019-09-23 DIAGNOSIS — K219 Gastro-esophageal reflux disease without esophagitis: Secondary | ICD-10-CM | POA: Insufficient documentation

## 2019-09-23 DIAGNOSIS — F17210 Nicotine dependence, cigarettes, uncomplicated: Secondary | ICD-10-CM | POA: Diagnosis not present

## 2019-09-23 DIAGNOSIS — R7303 Prediabetes: Secondary | ICD-10-CM | POA: Insufficient documentation

## 2019-09-23 DIAGNOSIS — I70211 Atherosclerosis of native arteries of extremities with intermittent claudication, right leg: Secondary | ICD-10-CM

## 2019-09-23 DIAGNOSIS — R000 Tachycardia, unspecified: Secondary | ICD-10-CM | POA: Diagnosis not present

## 2019-09-23 DIAGNOSIS — Z7982 Long term (current) use of aspirin: Secondary | ICD-10-CM | POA: Insufficient documentation

## 2019-09-23 DIAGNOSIS — I739 Peripheral vascular disease, unspecified: Secondary | ICD-10-CM

## 2019-09-23 DIAGNOSIS — M1990 Unspecified osteoarthritis, unspecified site: Secondary | ICD-10-CM | POA: Insufficient documentation

## 2019-09-23 HISTORY — PX: PERIPHERAL VASCULAR BALLOON ANGIOPLASTY: CATH118281

## 2019-09-23 HISTORY — PX: ABDOMINAL AORTOGRAM W/LOWER EXTREMITY: CATH118223

## 2019-09-23 HISTORY — PX: PERIPHERAL VASCULAR ATHERECTOMY: CATH118256

## 2019-09-23 LAB — POCT ACTIVATED CLOTTING TIME
Activated Clotting Time: 246 seconds
Activated Clotting Time: 252 seconds

## 2019-09-23 SURGERY — ABDOMINAL AORTOGRAM W/LOWER EXTREMITY
Anesthesia: LOCAL | Laterality: Right

## 2019-09-23 MED ORDER — LIDOCAINE HCL (PF) 1 % IJ SOLN
INTRAMUSCULAR | Status: DC | PRN
  Administered 2019-09-23: 15 mL via INTRADERMAL

## 2019-09-23 MED ORDER — NITROGLYCERIN IN D5W 200-5 MCG/ML-% IV SOLN
INTRAVENOUS | Status: AC
  Filled 2019-09-23: qty 250

## 2019-09-23 MED ORDER — SODIUM CHLORIDE 0.9 % WEIGHT BASED INFUSION
3.0000 mL/kg/h | INTRAVENOUS | Status: AC
  Administered 2019-09-23: 3 mL/kg/h via INTRAVENOUS

## 2019-09-23 MED ORDER — SODIUM CHLORIDE 0.9 % WEIGHT BASED INFUSION: 1.0000 mL/kg/h | INTRAVENOUS | Status: DC

## 2019-09-23 MED ORDER — SODIUM CHLORIDE 0.9% FLUSH: 3.0000 mL | Freq: Two times a day (BID) | INTRAVENOUS | Status: DC

## 2019-09-23 MED ORDER — SODIUM CHLORIDE 0.9 % IV SOLN: 250.0000 mL | INTRAVENOUS | Status: DC | PRN

## 2019-09-23 MED ORDER — LIDOCAINE HCL (PF) 1 % IJ SOLN
INTRAMUSCULAR | Status: AC
  Filled 2019-09-23: qty 30

## 2019-09-23 MED ORDER — ACETAMINOPHEN 325 MG PO TABS: 650.0000 mg | ORAL_TABLET | ORAL | Status: DC | PRN

## 2019-09-23 MED ORDER — ONDANSETRON HCL 4 MG/2ML IJ SOLN: 4.0000 mg | Freq: Four times a day (QID) | INTRAMUSCULAR | Status: DC | PRN

## 2019-09-23 MED ORDER — MIDAZOLAM HCL 2 MG/2ML IJ SOLN
INTRAMUSCULAR | Status: AC
  Filled 2019-09-23: qty 2

## 2019-09-23 MED ORDER — CLOPIDOGREL BISULFATE 300 MG PO TABS
ORAL_TABLET | ORAL | Status: DC | PRN
  Administered 2019-09-23: 600 mg via ORAL

## 2019-09-23 MED ORDER — SODIUM CHLORIDE 0.9 % IV SOLN: INTRAVENOUS | Status: DC

## 2019-09-23 MED ORDER — CLOPIDOGREL BISULFATE 300 MG PO TABS
ORAL_TABLET | ORAL | Status: AC
  Filled 2019-09-23: qty 2

## 2019-09-23 MED ORDER — FENTANYL CITRATE (PF) 100 MCG/2ML IJ SOLN
INTRAMUSCULAR | Status: AC
  Filled 2019-09-23: qty 2

## 2019-09-23 MED ORDER — VIPERSLIDE LUBRICANT OPTIME
TOPICAL | Status: DC | PRN
  Administered 2019-09-23: 20 mL via SURGICAL_CAVITY

## 2019-09-23 MED ORDER — HEPARIN SODIUM (PORCINE) 1000 UNIT/ML IJ SOLN
INTRAMUSCULAR | Status: DC | PRN
  Administered 2019-09-23: 3000 [IU] via INTRAVENOUS
  Administered 2019-09-23: 7000 [IU] via INTRAVENOUS

## 2019-09-23 MED ORDER — HEPARIN (PORCINE) IN NACL 1000-0.9 UT/500ML-% IV SOLN
INTRAVENOUS | Status: DC | PRN
  Administered 2019-09-23 (×2): 500 mL

## 2019-09-23 MED ORDER — CLOPIDOGREL BISULFATE 75 MG PO TABS: 75.0000 mg | ORAL_TABLET | Freq: Every day | ORAL | 11 refills | Status: DC

## 2019-09-23 MED ORDER — SODIUM CHLORIDE 0.9% FLUSH: 3.0000 mL | INTRAVENOUS | Status: DC | PRN

## 2019-09-23 MED ORDER — ASPIRIN 81 MG PO CHEW: 81.0000 mg | CHEWABLE_TABLET | ORAL | Status: DC

## 2019-09-23 MED ORDER — HEPARIN SODIUM (PORCINE) 1000 UNIT/ML IJ SOLN
INTRAMUSCULAR | Status: AC
  Filled 2019-09-23: qty 1

## 2019-09-23 MED ORDER — MIDAZOLAM HCL 2 MG/2ML IJ SOLN
INTRAMUSCULAR | Status: DC | PRN
  Administered 2019-09-23: 1 mg via INTRAVENOUS

## 2019-09-23 MED ORDER — SODIUM CHLORIDE 0.9 % IV SOLN
INTRAVENOUS | Status: AC | PRN
  Administered 2019-09-23: 250 mL via INTRAVENOUS

## 2019-09-23 MED ORDER — VERAPAMIL HCL 2.5 MG/ML IV SOLN
INTRAVENOUS | Status: AC
  Filled 2019-09-23: qty 2

## 2019-09-23 MED ORDER — IODIXANOL 320 MG/ML IV SOLN
INTRAVENOUS | Status: DC | PRN
  Administered 2019-09-23: 150 mL via INTRA_ARTERIAL

## 2019-09-23 MED ORDER — FENTANYL CITRATE (PF) 100 MCG/2ML IJ SOLN
INTRAMUSCULAR | Status: DC | PRN
  Administered 2019-09-23: 50 ug via INTRAVENOUS

## 2019-09-23 MED ORDER — LABETALOL HCL 5 MG/ML IV SOLN: 10.0000 mg | INTRAVENOUS | Status: DC | PRN

## 2019-09-23 MED ORDER — HEPARIN (PORCINE) IN NACL 1000-0.9 UT/500ML-% IV SOLN
INTRAVENOUS | Status: AC
  Filled 2019-09-23: qty 1000

## 2019-09-23 SURGICAL SUPPLY — 35 items
BAG SNAP BAND KOVER 36X36 (MISCELLANEOUS) ×3 IMPLANT
BALLN COYOTE OTW 4X220X150 (BALLOONS) ×3
BALLN STERLING OTW 5X100X135 (BALLOONS) ×3
BALLOON COYOTE OTW 4X220X150 (BALLOONS) ×2 IMPLANT
BALLOON STERLING OTW 5X100X135 (BALLOONS) ×2 IMPLANT
CATH ANGIO 5F PIGTAIL 65CM (CATHETERS) ×3 IMPLANT
CATH CROSS OVER TEMPO 5F (CATHETERS) ×3 IMPLANT
CATH NAVICROSS ST .035X90CM (MICROCATHETER) ×3 IMPLANT
CATH STRAIGHT 5FR 65CM (CATHETERS) ×3 IMPLANT
CLOSURE MYNX CONTROL 6F/7F (Vascular Products) ×3 IMPLANT
DCB RANGER 5.0X200 150 (BALLOONS) ×2 IMPLANT
DEVICE EMBOSHIELD NAV6 4.0-7.0 (FILTER) ×3 IMPLANT
DIAMONDBACK CLASSIC OAS 2.0MM (CATHETERS) ×3
GLIDEWIRE ADV .035X260CM (WIRE) ×3 IMPLANT
KIT ENCORE 26 ADVANTAGE (KITS) ×3 IMPLANT
KIT MICROPUNCTURE NIT STIFF (SHEATH) ×3 IMPLANT
KIT PV (KITS) ×3 IMPLANT
LUBRICANT VIPERSLIDE CORONARY (MISCELLANEOUS) ×3 IMPLANT
RANGER DCB 5.0X200 150 (BALLOONS) ×3
SHEATH PINNACLE 5F 10CM (SHEATH) ×3 IMPLANT
SHEATH PINNACLE 7F 10CM (SHEATH) ×3 IMPLANT
SHEATH PINNACLE MP 7F 45CM (SHEATH) ×3 IMPLANT
SHEATH PROBE COVER 6X72 (BAG) ×3 IMPLANT
SHIELD RADPAD SCOOP 12X17 (MISCELLANEOUS) ×3 IMPLANT
STENT ELUVIA 6X120X130 (Permanent Stent) ×3 IMPLANT
STOPCOCK MORSE 400PSI 3WAY (MISCELLANEOUS) ×3 IMPLANT
SYR MEDRAD MARK 7 150ML (SYRINGE) ×3 IMPLANT
SYSTEM DIMODBCK CLSC OAS 2.0MM (CATHETERS) ×2 IMPLANT
TAPE VIPERTRACK RADIOPAQ (MISCELLANEOUS) ×2 IMPLANT
TAPE VIPERTRACK RADIOPAQUE (MISCELLANEOUS) ×1
TRANSDUCER W/STOPCOCK (MISCELLANEOUS) ×3 IMPLANT
TRAY PV CATH (CUSTOM PROCEDURE TRAY) ×3 IMPLANT
TUBING CIL FLEX 10 FLL-RA (TUBING) ×3 IMPLANT
WIRE HITORQ VERSACORE ST 145CM (WIRE) ×3 IMPLANT
WIRE VIPER ADVANCE .017X335CM (WIRE) ×3 IMPLANT

## 2019-09-23 NOTE — Discharge Instructions (Signed)
Angiogram, Care After This sheet gives you information about how to care for yourself after your procedure. Your health care provider may also give you more specific instructions. If you have problems or questions, contact your health care provider. What can I expect after the procedure? After the procedure, it is common to have bruising and tenderness at the catheter insertion area. Follow these instructions at home: Insertion site care  Follow instructions from your health care provider about how to take care of your insertion site. Make sure you: ? Wash your hands with soap and water before you change your bandage (dressing). If soap and water are not available, use hand sanitizer. ? Change your dressing as told by your health care provider. ? Leave stitches (sutures), skin glue, or adhesive strips in place. These skin closures may need to stay in place for 2 weeks or longer. If adhesive strip edges start to loosen and curl up, you may trim the loose edges. Do not remove adhesive strips completely unless your health care provider tells you to do that.  Do not take baths, swim, or use a hot tub until your health care provider approves.  You may shower 24-48 hours after the procedure or as told by your health care provider. ? Gently wash the site with plain soap and water. ? Pat the area dry with a clean towel. ? Do not rub the site. This may cause bleeding.  Do not apply powder or lotion to the site. Keep the site clean and dry.  Check your insertion site every day for signs of infection. Check for: ? Redness, swelling, or pain. ? Fluid or blood. ? Warmth. ? Pus or a bad smell. Activity  Rest as told by your health care provider, usually for 1-2 days.  Do not lift anything that is heavier than 10 lbs. (4.5 kg) or as told by your health care provider.  Do not drive for 24 hours if you were given a medicine to help you relax (sedative).  Do not drive or use heavy machinery while  taking prescription pain medicine. General instructions   Return to your normal activities as told by your health care provider, usually in about a week. Ask your health care provider what activities are safe for you.  If the catheter site starts bleeding, lie flat and put pressure on the site. If the bleeding does not stop, get help right away. This is a medical emergency.  Drink enough fluid to keep your urine clear or pale yellow. This helps flush the contrast dye from your body.  Take over-the-counter and prescription medicines only as told by your health care provider.  Keep all follow-up visits as told by your health care provider. This is important. Contact a health care provider if:  You have a fever or chills.  You have redness, swelling, or pain around your insertion site.  You have fluid or blood coming from your insertion site.  The insertion site feels warm to the touch.  You have pus or a bad smell coming from your insertion site.  You have bruising around the insertion site.  You notice blood collecting in the tissue around the catheter site (hematoma). The hematoma may be painful to the touch. Get help right away if:  You have severe pain at the catheter insertion area.  The catheter insertion area swells very fast.  The catheter insertion area is bleeding, and the bleeding does not stop when you hold steady pressure on the area.    The area near or just beyond the catheter insertion site becomes pale, cool, tingly, or numb. These symptoms may represent a serious problem that is an emergency. Do not wait to see if the symptoms will go away. Get medical help right away. Call your local emergency services (911 in the [place] Do not drive yourself to the hospital. Summary  After the procedure, it is common to have bruising and tenderness at the catheter insertion area.  After the procedure, it is important to rest and drink plenty of fluids.  Do not take baths,  swim, or use a hot tub until your health care provider says it is okay to do so. You may shower 24-48 hours after the procedure or as told by your health care provider.  If the catheter site starts bleeding, lie flat and put pressure on the site. If the bleeding does not stop, get help right away. This is a medical emergency. This information is not intended to replace advice given to you by your health care provider. Make sure you discuss any questions you have with your health care provider. Document Revised: 08/09/2017 Document Reviewed: 08/01/2016 Elsevier Patient Education  2020 Elsevier Inc.  

## 2019-09-23 NOTE — H&P (Signed)
Cardiology Office Note   Date:  08/18/2019   ID:  Amy Bruce, Amy Bruce July 29, 1951, MRN [id number]  PCP:  London Pepper, MD       Cardiologist:   Kathlyn Sacramento, MD   No chief complaint on file.     History of Present Illness: Amy Bruce is a 69 y.o. female who is here today for a follow-up visit regarding peripheral arterial disease.  She has known history of peripheral arterial disease, tobacco use, essential hypertension and hyperlipidemia.  Previous CT imaging showed evidence of aortic and coronary calcifications.  She has prolonged history of plantar fasciitis. She has known history of peripheral arterial disease with moderately reduced ABI on the right at 0.54 and mild on the left at 0.81. Angiography in July 2019 showed diffuse right SFA disease with medium length occlusion in the mid to distal segment.  On the left there was moderate ostial left SFA stenosis with two-vessel runoff below the knee.  She was referred to vascular surgery to consider right femoral-popliteal bypass but medical therapy was pursued after.  She has been on cilostazol. Lexiscan Myoview in September 2019 showed no evidence of ischemia with normal ejection fraction. She continues to smoke but cut down to 1 cigarette a day.  She had back surgery done few months ago with improvement of symptoms.  Currently, she is limited by severe right calf claudication.  She also has significant bilateral plantar fasciitis.  She thinks that surgery is needed on the right side but that has been postponed due to known peripheral arterial disease ending revascularization. She has chronic sinus tachycardia.  She denies chest pain, shortness of breath or palpitations.       Past Medical History:  Diagnosis Date  . Anemia    in the past  . Anxiety   . Arthritis   . Cancer ([place])    small in ovary - had total hysterestomy  . Fibromyalgia   . GERD (gastroesophageal reflux disease)   . Heart murmur    due  to rheumatic fever, PCP no longer hears it  . Hyperlipidemia   . Hypertension   . Myalgia   . Neuropathy   . PAD (peripheral artery disease) ([place])   . PAD (peripheral artery disease) ([place])   . Pre-diabetes   . Pyelonephritis 09/10/2019    "septic"  . Tobacco abuse   . 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 ABDOMINAL AORTOGRAM W/LOWER EXTREMITY N/A 04/02/2018   Procedure: ABDOMINAL AORTOGRAM W/LOWER EXTREMITY;  Surgeon: Wellington Hampshire, MD;  Location: [facility];  Service: Cardiovascular;  Laterality: N/A;  . ABDOMINAL HYSTERECTOMY  1999  . [place]   C5/6 6/7  . COLONOSCOPY    . ECTOPIC PREGNANCY SURGERY    . Left axillary cyst removal 1989    . MULTIPLE EXTRACTIONS WITH ALVEOLOPLASTY N/A 10/25/2017   Procedure: EXTRACTION NUMBERS FIVE, SEVEN, EIGHT, NINE, TEN, FIFTEEN, EIGHTEEN WITH ALVEOLOPLASTY, IRRIGATION AND DEBRIDEMENT LEFT SUBMANDIBULAR INFECTION;  Surgeon: Diona Browner, DDS;  Location: [facility];  Service: Oral Surgery;  Laterality: N/A;  . RADIOLOGY WITH ANESTHESIA N/A 11/18/2018   Procedure: MRI OF THE CERVICAL SPINE WITHOUT CONTRAST;  Surgeon: Radiologist, Medication, MD;  Location: [facility];  Service: Radiology;  Laterality: N/A;  . TONSILECTOMY, ADENOIDECTOMY, BILATERAL MYRINGOTOMY AND TUBES  1981   put not aware of tubes being put in  ears  . TUBAL LIGATION  1981           Current Outpatient Medications  Medication Sig Dispense Refill  . albuterol (VENTOLIN HFA) 108 (90 Base) MCG/ACT inhaler Inhale 1-2 puffs into the lungs every 4 (four) hours as needed for wheezing. 18 g 5  . amitriptyline (ELAVIL) 25 MG tablet TAKE 1 TABLET(25 MG) BY MOUTH AT BEDTIME 30 tablet 1  . aspirin EC 81 MG tablet Take 81 mg by mouth daily.    Marland Kitchen atorvastatin (LIPITOR) 40 MG tablet TK 1 T PO QD    . calcium carbonate (TUMS - DOSED IN MG ELEMENTAL CALCIUM) 500 MG  chewable tablet Chew 2 tablets by mouth daily as needed for indigestion or heartburn.     . Camphor-Eucalyptus-Menthol (VICKS VAPORUB EX) Apply 1 application topically daily as needed (congestion).    . cetirizine (ZYRTEC) 10 MG tablet Take 10 mg by mouth daily as needed for allergies.    . cilostazol (PLETAL) 100 MG tablet Take 1 tablet (100 mg total) by mouth 2 (two) times daily. 90 tablet 3  . cyclobenzaprine (FLEXERIL) 10 MG tablet Take 1 tablet (10 mg total) by mouth 3 (three) times daily as needed for muscle spasms. 90 tablet 1  . diclofenac (VOLTAREN) 50 MG EC tablet TAKE 1 TABLET(50 MG) BY MOUTH THREE TIMES DAILY 90 tablet 1  . fluconazole (DIFLUCAN) 150 MG tablet TK 1 T PO 1 TIME. MAY REPEAT IN 3-5 DAYS PRN PO 1 DAY    . gabapentin (NEURONTIN) 600 MG tablet TAKE 2 TABLETS(1200 MG) BY MOUTH THREE TIMES DAILY 180 tablet 1  . lidocaine (LIDODERM) 5 % UNWRAP AND APPLY 1 PATCH TO SKIN DAILY. REMOVE AND DISCARD PATCH WITHIN 12 HOURS OR AS DIRECTED BY MD. (Patient taking differently: Place 1 patch onto the skin daily as needed (for pain). ) 30 patch 1  . nicotine (NICODERM CQ - DOSED IN MG/24 HOURS) 21 mg/24hr patch Place 21 mg onto the skin daily as needed (smoking cessation).    . nicotine polacrilex (NICORETTE) 2 MG lozenge Take 2 mg by mouth as needed for smoking cessation.    Marland Kitchen omeprazole (PRILOSEC) 20 MG capsule Take 20 mg by mouth daily.     Marland Kitchen oxyCODONE-acetaminophen (PERCOCET) 7.5-325 MG tablet Take 1 tablet by mouth every 4 (four) hours as needed for severe pain.    . vitamin B-12 (CYANOCOBALAMIN) 500 MCG tablet Take 500 mcg by mouth daily.    Marland Kitchen lisinopril (ZESTRIL) 20 MG tablet Take 1 tablet (20 mg total) by mouth daily. (Patient not taking: Reported on 08/18/2019) 90 tablet 3   No current facility-administered medications for this visit.     Allergies:   Carisoprodol and Duloxetine hcl    Social History:  The patient  reports that she has been smoking  cigarettes. She has a 33.00 pack-year smoking history. She has never used smokeless tobacco. She reports current alcohol use of about 2.0 standard drinks of alcohol per week. She reports that she does not use drugs.   Family History:  The patient's family history includes Ovarian cancer in her mother.    ROS:  Please see the history of present illness.   Otherwise, review of systems are positive for none.   All other systems are reviewed and negative.    PHYSICAL EXAM: VS:  BP (!) 143/74   Pulse (!) 118   Temp (!) 96.5 F (35.8 C)   Ht 5' (1.524 m)   Wt 171 lb 12.8  oz (77.9 kg)   SpO2 98%   BMI 33.55 kg/m  , BMI Body mass index is 33.55 kg/m. GEN: Well nourished, well developed, in no acute distress  HEENT: normal  Neck: no JVD, carotid bruits, or masses Cardiac: RRR; no murmurs, rubs, or gallops,no edema  Respiratory:  clear to auscultation bilaterally, normal work of breathing GI: soft, nontender, nondistended, + BS MS: no deformity or atrophy  Skin: warm and dry, no rash Neuro:  Strength and sensation are intact Psych: euthymic mood, full affect Vascular: Femoral pulses are +1 bilaterally.  Distal pulses are not palpable   EKG:  EKG is not ordered today. EKG showed sinus tachycardia with poor R wave progression in the anterior leads.  No significant ST or T wave changes.  Recent Labs: 09/09/2018: ALT 22 11/18/2018: BUN 14; Creatinine, Ser 1.05; Hemoglobin 11.6; Platelets 292; Potassium 3.5; Sodium 138    Lipid Panel Labs (Brief)  No results found for: CHOL, TRIG, HDL, CHOLHDL, VLDL, LDLCALC, LDLDIRECT         Wt Readings from Last 3 Encounters:  08/18/19 171 lb 12.8 oz (77.9 kg)  05/27/19 173 lb (78.5 kg)  05/19/19 177 lb (80.3 kg)       No flowsheet data found.    ASSESSMENT AND PLAN:  1. Peripheral arterial disease: Severe right calf claudication.  Only partial improvement with cilostazol and she continues to feel significantly  limited by this.  She wants some form of revascularization performed in order to improve her functional capacity and potentially have surgery done to improve plantar fasciitis.   I reviewed her angiogram done in July 2019 with her.  She has medium length occlusion in the right SFA and the whole vessel proximally is diffusely diseased including the ostium.  Endovascular intervention is possible and I did explain with her the limitations of her procedure and possible need for intervention even with successful revascularization.  I discussed all the risks associated with the procedure and she wants to proceed.  2.  Tobacco use: She cut down to 1 cigarette a day.  3.  Hyperlipidemia: Continue atorvastatin with a target LDL of less than 70.  4.  Essential hypertension: Blood pressure is mildly elevated but she reports whitecoat syndrome.   Disposition:   FU with me in 6 weeks  Signed,  Kathlyn Sacramento, MD  08/18/2019 5:38 PM    [place] on September 23, 2019: The patient continues to have the same symptoms.  She did have a UTI that was treated with antibiotics and she is doing better now.  The plan is to proceed with attempted endovascular intervention on the right SFA.  The patient had no further questions.

## 2019-09-24 MED FILL — Clopidogrel Bisulfate Tab 300 MG (Base Equiv): ORAL | Qty: 2 | Status: AC

## 2019-09-30 ENCOUNTER — Inpatient Hospital Stay (HOSPITAL_COMMUNITY): Admission: RE | Admit: 2019-09-30 | Payer: Medicare Other | Source: Ambulatory Visit

## 2019-10-02 ENCOUNTER — Ambulatory Visit (HOSPITAL_COMMUNITY)
Admission: RE | Admit: 2019-10-02 | Discharge: 2019-10-02 | Disposition: A | Discharge status: Home / Self Care | Payer: Medicare Other | Source: Ambulatory Visit | Attending: Cardiology | Admitting: Cardiology

## 2019-10-02 ENCOUNTER — Other Ambulatory Visit: Payer: Self-pay

## 2019-10-02 ENCOUNTER — Other Ambulatory Visit (HOSPITAL_COMMUNITY): Payer: Self-pay | Admitting: Cardiovascular Disease

## 2019-10-02 DIAGNOSIS — I739 Peripheral vascular disease, unspecified: Secondary | ICD-10-CM | POA: Insufficient documentation

## 2019-10-05 ENCOUNTER — Other Ambulatory Visit: Payer: Self-pay | Admitting: Physical Medicine & Rehabilitation

## 2019-10-06 ENCOUNTER — Encounter: Payer: Self-pay | Admitting: Cardiovascular Disease

## 2019-10-06 ENCOUNTER — Ambulatory Visit (INDEPENDENT_AMBULATORY_CARE_PROVIDER_SITE_OTHER): Payer: Medicare Other | Admitting: Cardiovascular Disease

## 2019-10-06 ENCOUNTER — Other Ambulatory Visit: Payer: Self-pay

## 2019-10-06 VITALS — BP 136/70 | HR 102 | Ht 60.0 in | Wt 172.0 lb

## 2019-10-06 DIAGNOSIS — I739 Peripheral vascular disease, unspecified: Secondary | ICD-10-CM | POA: Diagnosis not present

## 2019-10-06 DIAGNOSIS — Z720 Tobacco use: Secondary | ICD-10-CM

## 2019-10-06 DIAGNOSIS — I10 Essential (primary) hypertension: Secondary | ICD-10-CM

## 2019-10-06 DIAGNOSIS — E785 Hyperlipidemia, unspecified: Secondary | ICD-10-CM

## 2019-10-06 NOTE — Progress Notes (Signed)
Cardiology Office Note   Date:  10/06/2019   ID:  C[place], Share [birth year], MRN [id number]  PCP:  London Pepper, MD  Cardiologist:   Kathlyn Sacramento, MD   No chief complaint on file.     History of Present Illness: Amy Bruce is a 69 y.o. female who is here today for a follow-up visit regarding peripheral arterial disease.  She has known history of peripheral arterial disease, tobacco use, essential hypertension and hyperlipidemia.  Previous CT imaging showed evidence of aortic and coronary calcifications.  She has prolonged history of plantar fasciitis. She has known history of peripheral arterial disease with moderately reduced ABI on the right at 0.54 and mild on the left at 0.81. Angiography in July 2019 showed diffuse right SFA disease with medium length occlusion in the mid to distal segment.  On the left there was moderate ostial left SFA stenosis with two-vessel runoff below the knee.  She was referred to vascular surgery to consider right femoral-popliteal bypass but medical therapy was pursued after.   Lexiscan Myoview in September 2019 showed no evidence of ischemia with normal ejection fraction. She had back surgery done last year.  She was seen recently for worsening right calf claudication and thus I proceeded with angiography earlier this month which showed no significant change from last year.  I performed successful orbital atherectomy to the proximal and mid SFA followed by drug-coated balloon angioplasty.  The distal SFA was treated with drug-eluting stent given that the reentry was subintimal.  Postprocedure ABI improved to normal with mildly decreased toe pressure. She is doing very well with resolution of claudication.  She cut down on tobacco use and she is down to 2 cigarettes a day.    Past Medical History:  Diagnosis Date  . Anemia    in the past  . Anxiety   . Arthritis   . 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  Fibromyalgia   . GERD (gastroesophageal reflux disease)   . Heart murmur    due to rheumatic fever, PCP no longer hears it  . Hyperlipidemia   . Hypertension   . Myalgia   . Neuropathy   . PAD (peripheral artery disease) ([place])   . PAD (peripheral artery disease) ([place])   . Pre-diabetes   . Pyelonephritis 09/10/2019    "septic"  . Tobacco abuse   . 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 ABDOMINAL AORTOGRAM W/LOWER EXTREMITY N/A 04/02/2018   Procedure: ABDOMINAL AORTOGRAM W/LOWER EXTREMITY;  Surgeon: Wellington Hampshire, MD;  Location: [facility];  Service: Cardiovascular;  Laterality: N/A;  . ABDOMINAL AORTOGRAM W/LOWER EXTREMITY N/A 09/23/2019   Procedure: ABDOMINAL AORTOGRAM W/LOWER EXTREMITY;  Surgeon: Wellington Hampshire, MD;  Location: [facility];  Service: Cardiovascular;  Laterality: N/A;  . ABDOMINAL HYSTERECTOMY  1999  . [place]   C5/6 6/7  . COLONOSCOPY    . ECTOPIC PREGNANCY SURGERY    . Left axillary cyst removal 1989    . MULTIPLE EXTRACTIONS WITH ALVEOLOPLASTY N/A 10/25/2017   Procedure: EXTRACTION NUMBERS FIVE, SEVEN, EIGHT, NINE, TEN, FIFTEEN, EIGHTEEN WITH ALVEOLOPLASTY, IRRIGATION AND DEBRIDEMENT LEFT SUBMANDIBULAR INFECTION;  Surgeon: Diona Browner, DDS;  Location: [facility];  Service: Oral Surgery;  Laterality: N/A;  . PERIPHERAL VASCULAR ATHERECTOMY Right 09/23/2019   Procedure: PERIPHERAL VASCULAR ATHERECTOMY;  Surgeon: Wellington Hampshire, MD;  Location: [facility];  Service: Cardiovascular;  Laterality: Right;  SFA  . PERIPHERAL VASCULAR BALLOON ANGIOPLASTY Right 09/23/2019   Procedure: PERIPHERAL VASCULAR BALLOON ANGIOPLASTY;  Surgeon: Wellington Hampshire, MD;  Location: [facility];  Service: Cardiovascular;  Laterality: Right;  SFA  . RADIOLOGY WITH ANESTHESIA N/A 11/18/2018   Procedure: MRI OF THE CERVICAL SPINE WITHOUT CONTRAST;  Surgeon: Radiologist,  Medication, MD;  Location: [facility];  Service: Radiology;  Laterality: N/A;  . TONSILECTOMY, ADENOIDECTOMY, BILATERAL MYRINGOTOMY AND TUBES  1981   put not aware of tubes being put in ears  . TUBAL LIGATION  1981     Current Outpatient Medications  Medication Sig Dispense Refill  . albuterol (VENTOLIN HFA) 108 (90 Base) MCG/ACT inhaler Inhale 1-2 puffs into the lungs every 4 (four) hours as needed for wheezing. 18 g 5  . amitriptyline (ELAVIL) 25 MG tablet TAKE 1 TABLET(25 MG) BY MOUTH AT BEDTIME 30 tablet 1  . aspirin EC 81 MG tablet Take 81 mg by mouth daily.    Marland Kitchen atorvastatin (LIPITOR) 40 MG tablet Take 40 mg by mouth daily.     . calcium carbonate (TUMS - DOSED IN MG ELEMENTAL CALCIUM) 500 MG chewable tablet Chew 2 tablets by mouth daily as needed for indigestion or heartburn.     . Camphor-Eucalyptus-Menthol (VICKS VAPORUB EX) Apply 1 application topically daily as needed (congestion).    . cetirizine (ZYRTEC) 10 MG tablet Take 10 mg by mouth daily as needed for allergies.    Marland Kitchen clopidogrel (PLAVIX) 75 MG tablet Take 1 tablet (75 mg total) by mouth daily. 30 tablet 11  . cyclobenzaprine (FLEXERIL) 10 MG tablet Take 1 tablet (10 mg total) by mouth 3 (three) times daily as needed for muscle spasms. 90 tablet 1  . diclofenac (VOLTAREN) 50 MG EC tablet TAKE 1 TABLET(50 MG) BY MOUTH THREE TIMES DAILY 90 tablet 1  . fluconazole (DIFLUCAN) 150 MG tablet Take 150 mg by mouth once.     . gabapentin (NEURONTIN) 600 MG tablet TAKE 2 TABLETS(1200 MG) BY MOUTH THREE TIMES DAILY 180 tablet 1  . lidocaine (LIDODERM) 5 % UNWRAP AND APPLY 1 PATCH TO SKIN DAILY. REMOVE AND DISCARD PATCH WITHIN 12 HOURS OR AS DIRECTED BY MD. 30 patch 1  . nicotine (NICODERM CQ - DOSED IN MG/24 HOURS) 21 mg/24hr patch Place 21 mg onto the skin daily as needed (smoking cessation).    . nicotine polacrilex (NICORETTE) 2 MG lozenge Take 2 mg by mouth as needed for smoking cessation.    Marland Kitchen omeprazole (PRILOSEC) 20 MG capsule Take 20  mg by mouth daily.     Marland Kitchen oxyCODONE-acetaminophen (PERCOCET) 7.5-325 MG tablet Take 1 tablet by mouth every 4 (four) hours as needed for severe pain.    . vitamin B-12 (CYANOCOBALAMIN) 500 MCG tablet Take 500 mcg by mouth daily.     No current facility-administered medications for this visit.    Allergies:   Carisoprodol and Duloxetine hcl    Social History:  The patient  reports that she has been smoking cigarettes. She has a 33.00 pack-year smoking history. She has never used smokeless tobacco. She reports current alcohol use of about 2.0 standard drinks of alcohol per week. She reports that she does not use drugs.   Family History:  The patient's family history includes Ovarian cancer in her mother.    ROS:  Please see the history of present illness.   Otherwise, review  of systems are positive for none.   All other systems are reviewed and negative.    PHYSICAL EXAM: VS:  BP 136/70   Pulse (!) 102   Ht 5' (1.524 m)   Wt 172 lb (78 kg)   SpO2 99%   BMI 33.59 kg/m  , BMI Body mass index is 33.59 kg/m. GEN: Well nourished, well developed, in no acute distress  HEENT: normal  Neck: no JVD, carotid bruits, or masses Cardiac: RRR; no murmurs, rubs, or gallops,no edema  Respiratory:  clear to auscultation bilaterally, normal work of breathing GI: soft, nontender, nondistended, + BS MS: no deformity or atrophy  Skin: warm and dry, no rash Neuro:  Strength and sensation are intact Psych: euthymic mood, full affect Vascular: Femoral pulses are +1 bilaterally.  Dorsalis pedis is now palpable bilaterally.  No groin hematoma   EKG:  EKG is not ordered today.   Recent Labs: 09/10/2019: ALT 18; BUN 10; Creatinine, Ser 1.02; Hemoglobin 12.3; Platelets 208; Potassium 4.2; Sodium 137    Lipid Panel No results found for: CHOL, TRIG, HDL, CHOLHDL, VLDL, LDLCALC, LDLDIRECT    Wt Readings from Last 3 Encounters:  10/06/19 172 lb (78 kg)  09/23/19 169 lb (76.7 kg)  09/10/19 164 lb  (74.4 kg)       No flowsheet data found.    ASSESSMENT AND PLAN:  1. Peripheral arterial disease: Status post recent right SFA endovascular intervention for severe right calf claudication.  Complete resolution of claudication with improvement in ABI to normal.  Continue dual antiplatelet therapy for at least 6 months given the placement of drug-eluting stent but likely will keep her long-term. Repeat vascular studies in 6 months.  2.  Tobacco use: She cut down to 1 to 2 cigarettes a day and is planning to quit completely.  3.  Hyperlipidemia: Continue atorvastatin with a target LDL of less than 70.  4.  Essential hypertension: Blood pressure is reasonably controlled on current medications.   Disposition:   FU with me in 6 months.  Signed,  Kathlyn Sacramento, MD  10/06/2019 8:26 AM    [facility]

## 2019-10-06 NOTE — Patient Instructions (Signed)
Medication Instructions:  None ordered *If you need a refill on your cardiac medications before your next appointment, please call your pharmacy*  Lab Work: None ordered If you have labs (blood work) drawn today and your tests are completely normal, you will receive your results only by: Marland Kitchen MyChart Message (if you have MyChart) OR . A paper copy in the mail If you have any lab test that is abnormal or we need to change your treatment, we will call you to review the results.  Testing/Procedures: Your physician has requested that you have an ankle brachial index (ABI) in 6 months. During this test an ultrasound and blood pressure cuff are used to evaluate the arteries that supply the arms and legs with blood. Allow thirty minutes for this exam. There are no restrictions or special instructions. This will take place at [place], [address].   Your physician has requested that you have a lower extremity arterial duplex in 6 months. During this test, ultrasound is used to evaluate arterial blood flow in the legs. Allow one hour for this exam. There are no restrictions or special instructions. This will take place at [place], [address].   Follow-Up: At [facility], you and your health needs are our priority.  As part of our continuing mission to provide you with exceptional heart care, we have created designated Provider Care Teams.  These Care Teams include your primary Cardiologist (physician) and Advanced Practice Providers (APPs -  Physician Assistants and Nurse Practitioners) who all work together to provide you with the care you need, when you need it.  Your next appointment:   6 month(s)  The format for your next appointment:   In Person  Provider:   Kathlyn Sacramento, MD

## 2019-10-07 ENCOUNTER — Encounter (HOSPITAL_COMMUNITY): Payer: Medicare Other

## 2019-10-14 ENCOUNTER — Encounter: Payer: Self-pay | Admitting: Podiatry

## 2019-10-14 ENCOUNTER — Ambulatory Visit (INDEPENDENT_AMBULATORY_CARE_PROVIDER_SITE_OTHER): Payer: Medicare Other | Admitting: Podiatry

## 2019-10-14 ENCOUNTER — Other Ambulatory Visit: Payer: Self-pay

## 2019-10-14 ENCOUNTER — Ambulatory Visit (INDEPENDENT_AMBULATORY_CARE_PROVIDER_SITE_OTHER): Payer: Medicare Other

## 2019-10-14 ENCOUNTER — Other Ambulatory Visit: Payer: Self-pay | Admitting: Podiatry

## 2019-10-14 VITALS — Temp 96.5°F

## 2019-10-14 DIAGNOSIS — M722 Plantar fascial fibromatosis: Secondary | ICD-10-CM | POA: Diagnosis not present

## 2019-10-14 DIAGNOSIS — M79672 Pain in left foot: Secondary | ICD-10-CM | POA: Diagnosis not present

## 2019-10-14 DIAGNOSIS — I999 Unspecified disorder of circulatory system: Secondary | ICD-10-CM

## 2019-10-14 DIAGNOSIS — M79671 Pain in right foot: Secondary | ICD-10-CM | POA: Diagnosis not present

## 2019-10-14 DIAGNOSIS — E785 Hyperlipidemia, unspecified: Secondary | ICD-10-CM | POA: Insufficient documentation

## 2019-10-14 DIAGNOSIS — R739 Hyperglycemia, unspecified: Secondary | ICD-10-CM | POA: Insufficient documentation

## 2019-10-14 DIAGNOSIS — Z807 Family history of other malignant neoplasms of lymphoid, hematopoietic and related tissues: Secondary | ICD-10-CM | POA: Insufficient documentation

## 2019-10-14 DIAGNOSIS — R3129 Other microscopic hematuria: Secondary | ICD-10-CM | POA: Insufficient documentation

## 2019-10-14 NOTE — Progress Notes (Signed)
Subjective:   Patient ID: Amy Bruce, female   DOB: 69 y.o.   MRN: [id number]   HPI Patient presents stating she has had a lot of pain in her arch radiating up and she did have a stent put into her right leg.  States that the pain is worse when she tries to do walking   ROS      Objective:  Physical Exam  Neurovascular status reveals diminished pulses bilateral with neurological intact.  Patient is found to have arch discomfort bilateral but the pain seems to be more vascular given her description of the pain occurring after a certain amount of minutes or walking.  Also complains of pain in the lower legs calf muscle and into the upper legs     Assessment:  Probability for fasciitis-like symptoms with a secondary probable vascular component that is being taken care of by vascular physicians     Plan:  H&P reviewed conditions and at this point I have recommended night splint to try to provide stretching and support along with trying to find other activities she can do with the pain she is experiencing.  Patient will be seen back for [place] to recheck again and will get a try an over-the-counter insole also today and will see back to see what else we can do to help

## 2019-10-14 NOTE — Patient Instructions (Signed)

## 2019-11-01 ENCOUNTER — Ambulatory Visit: Payer: Medicare Other | Attending: Internal Medicine

## 2019-11-01 DIAGNOSIS — Z23 Encounter for immunization: Secondary | ICD-10-CM | POA: Insufficient documentation

## 2019-11-01 NOTE — Progress Notes (Signed)
   Covid-19 Vaccination Clinic  Name:  Amy Bruce    MRN: [id number] DOB: 22-May-1951  11/01/2019  Amy Bruce was observed post Covid-19 immunization for 15 minutes without incidence. She was provided with Vaccine Information Sheet and instruction to access the V-Safe system.   Amy Bruce was instructed to call 911 with any severe reactions post vaccine: Marland Kitchen Difficulty breathing  . Swelling of your face and throat  . A fast heartbeat  . A bad rash all over your body  . Dizziness and weakness    Immunizations Administered    Name Date Dose VIS Date Route   Pfizer COVID-19 Vaccine 11/01/2019  8:27 AM 0.3 mL 08/21/2019 Intramuscular   Manufacturer: [place]   Lot: [id number]   [place]: [id number]

## 2019-11-04 ENCOUNTER — Other Ambulatory Visit: Payer: Self-pay | Admitting: Physical Medicine & Rehabilitation

## 2019-11-05 ENCOUNTER — Other Ambulatory Visit: Payer: Self-pay | Admitting: Physical Medicine & Rehabilitation

## 2019-11-19 ENCOUNTER — Other Ambulatory Visit (HOSPITAL_COMMUNITY): Payer: Self-pay | Admitting: Physician Assistant

## 2019-11-19 DIAGNOSIS — M4802 Spinal stenosis, cervical region: Secondary | ICD-10-CM

## 2019-11-20 ENCOUNTER — Telehealth (HOSPITAL_COMMUNITY): Payer: Self-pay

## 2019-11-24 ENCOUNTER — Ambulatory Visit: Payer: Medicare Other

## 2019-11-25 ENCOUNTER — Ambulatory Visit: Payer: Medicare Other | Attending: Internal Medicine

## 2019-11-25 ENCOUNTER — Telehealth (HOSPITAL_COMMUNITY): Payer: Self-pay

## 2019-11-25 DIAGNOSIS — Z23 Encounter for immunization: Secondary | ICD-10-CM

## 2019-11-25 NOTE — Progress Notes (Signed)
   Covid-19 Vaccination Clinic  Name:  TARAMARIE SCHNAKE    MRN: [id number] DOB: 1951-07-16  11/25/2019  Ms. Ballog was observed post Covid-19 immunization for 15 minutes without incident. She was provided with Vaccine Information Sheet and instruction to access the V-Safe system.   Ms. Bruyere was instructed to call 911 with any severe reactions post vaccine: Marland Kitchen Difficulty breathing  . Swelling of face and throat  . A fast heartbeat  . A bad rash all over body  . Dizziness and weakness   Immunizations Administered    Name Date Dose VIS Date Route   Pfizer COVID-19 Vaccine 11/25/2019  8:12 AM 0.3 mL 08/21/2019 Intramuscular   Manufacturer: [place]   Lot: [id number]   [place]: [id number]

## 2019-11-30 ENCOUNTER — Other Ambulatory Visit: Payer: Self-pay

## 2019-11-30 ENCOUNTER — Encounter: Payer: Self-pay | Admitting: Physical Medicine & Rehabilitation

## 2019-11-30 ENCOUNTER — Ambulatory Visit (HOSPITAL_COMMUNITY)
Admission: RE | Admit: 2019-11-30 | Discharge: 2019-11-30 | Disposition: A | Discharge status: Home / Self Care | Payer: Medicare Other | Source: Ambulatory Visit | Attending: Physician Assistant | Admitting: Physician Assistant

## 2019-11-30 ENCOUNTER — Encounter: Payer: Medicare Other | Attending: Physical Medicine & Rehabilitation | Admitting: Physical Medicine & Rehabilitation

## 2019-11-30 ENCOUNTER — Ambulatory Visit: Payer: Medicare Other | Admitting: Podiatry

## 2019-11-30 VITALS — BP 151/69 | HR 94 | Temp 97.3°F | Ht 60.0 in | Wt 173.0 lb

## 2019-11-30 DIAGNOSIS — M791 Myalgia, unspecified site: Secondary | ICD-10-CM

## 2019-11-30 DIAGNOSIS — M792 Neuralgia and neuritis, unspecified: Secondary | ICD-10-CM

## 2019-11-30 DIAGNOSIS — K219 Gastro-esophageal reflux disease without esophagitis: Secondary | ICD-10-CM | POA: Diagnosis not present

## 2019-11-30 DIAGNOSIS — G8929 Other chronic pain: Secondary | ICD-10-CM | POA: Diagnosis present

## 2019-11-30 DIAGNOSIS — M25511 Pain in right shoulder: Secondary | ICD-10-CM | POA: Diagnosis present

## 2019-11-30 DIAGNOSIS — M797 Fibromyalgia: Secondary | ICD-10-CM | POA: Diagnosis not present

## 2019-11-30 DIAGNOSIS — R7303 Prediabetes: Secondary | ICD-10-CM | POA: Diagnosis not present

## 2019-11-30 DIAGNOSIS — Z7982 Long term (current) use of aspirin: Secondary | ICD-10-CM | POA: Diagnosis not present

## 2019-11-30 DIAGNOSIS — M1990 Unspecified osteoarthritis, unspecified site: Secondary | ICD-10-CM | POA: Diagnosis not present

## 2019-11-30 DIAGNOSIS — G894 Chronic pain syndrome: Secondary | ICD-10-CM

## 2019-11-30 DIAGNOSIS — M542 Cervicalgia: Secondary | ICD-10-CM

## 2019-11-30 DIAGNOSIS — Z981 Arthrodesis status: Secondary | ICD-10-CM

## 2019-11-30 DIAGNOSIS — E785 Hyperlipidemia, unspecified: Secondary | ICD-10-CM | POA: Diagnosis not present

## 2019-11-30 DIAGNOSIS — F17200 Nicotine dependence, unspecified, uncomplicated: Secondary | ICD-10-CM | POA: Diagnosis not present

## 2019-11-30 DIAGNOSIS — Z7902 Long term (current) use of antithrombotics/antiplatelets: Secondary | ICD-10-CM | POA: Diagnosis not present

## 2019-11-30 DIAGNOSIS — Z79899 Other long term (current) drug therapy: Secondary | ICD-10-CM | POA: Diagnosis not present

## 2019-11-30 DIAGNOSIS — M5412 Radiculopathy, cervical region: Secondary | ICD-10-CM | POA: Insufficient documentation

## 2019-11-30 DIAGNOSIS — M47812 Spondylosis without myelopathy or radiculopathy, cervical region: Secondary | ICD-10-CM

## 2019-11-30 DIAGNOSIS — Z20822 Contact with and (suspected) exposure to covid-19: Secondary | ICD-10-CM | POA: Insufficient documentation

## 2019-11-30 DIAGNOSIS — Z01812 Encounter for preprocedural laboratory examination: Secondary | ICD-10-CM | POA: Diagnosis not present

## 2019-11-30 DIAGNOSIS — I10 Essential (primary) hypertension: Secondary | ICD-10-CM | POA: Diagnosis not present

## 2019-11-30 DIAGNOSIS — I739 Peripheral vascular disease, unspecified: Secondary | ICD-10-CM | POA: Diagnosis not present

## 2019-11-30 DIAGNOSIS — Z720 Tobacco use: Secondary | ICD-10-CM

## 2019-11-30 DIAGNOSIS — M4802 Spinal stenosis, cervical region: Secondary | ICD-10-CM | POA: Diagnosis not present

## 2019-11-30 DIAGNOSIS — F17210 Nicotine dependence, cigarettes, uncomplicated: Secondary | ICD-10-CM | POA: Diagnosis not present

## 2019-11-30 DIAGNOSIS — R011 Cardiac murmur, unspecified: Secondary | ICD-10-CM | POA: Diagnosis not present

## 2019-11-30 MED ORDER — AMITRIPTYLINE HCL 50 MG PO TABS: 50.0000 mg | ORAL_TABLET | Freq: Every day | ORAL | 1 refills | Status: DC

## 2019-11-30 MED ORDER — PREGABALIN 200 MG PO CAPS: 200.0000 mg | ORAL_CAPSULE | Freq: Three times a day (TID) | ORAL | 1 refills | Status: DC

## 2019-11-30 MED ORDER — TIZANIDINE HCL 4 MG PO TABS: 2.0000 mg | ORAL_TABLET | Freq: Three times a day (TID) | ORAL | 1 refills | Status: DC
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
## 2019-12-01 LAB — SARS CORONAVIRUS 2 (TAT 6-24 HRS): SARS Coronavirus 2: NEGATIVE

## 2019-12-02 ENCOUNTER — Encounter (HOSPITAL_COMMUNITY): Payer: Self-pay | Admitting: *Deleted

## 2019-12-02 ENCOUNTER — Other Ambulatory Visit: Payer: Self-pay

## 2019-12-02 NOTE — Anesthesia Preprocedure Evaluation (Addendum)
Anesthesia Evaluation  Patient identified by MRN, date of birth, ID band Patient awake    Reviewed: Allergy & Precautions, NPO status , Patient's Chart, lab work & pertinent test results  History of Anesthesia Complications Negative for: history of anesthetic complications  Airway Mallampati: II  TM Distance: >3 FB Neck ROM: Full    Dental  (+) Partial Upper   Pulmonary Current Smoker and Patient abstained from smoking.,    Pulmonary exam normal        Cardiovascular hypertension, + Peripheral Vascular Disease  Normal cardiovascular exam     Neuro/Psych PSYCHIATRIC DISORDERS Anxiety negative neurological ROS     GI/Hepatic Neg liver ROS, GERD  ,  Endo/Other  negative endocrine ROS  Renal/GU negative Renal ROS  negative genitourinary   Musculoskeletal  (+) Arthritis , Fibromyalgia -  Abdominal   Peds  Hematology Plavix   Anesthesia Other Findings  Stress test 05/23/18: hyperdynamic LV (76%), normal wall motion, no perfusion defect, no evidence for ischemia or infarction  Reproductive/Obstetrics                          Anesthesia Physical Anesthesia Plan  ASA: III  Anesthesia Plan: General   Post-op Pain Management:    Induction: Intravenous  PONV Risk Score and Plan: 2 and Ondansetron, Dexamethasone, Midazolam and Treatment may vary due to age or medical condition  Airway Management Planned: LMA  Additional Equipment: None  Intra-op Plan:   Post-operative Plan: Extubation in OR  Informed Consent: I have reviewed the patients History and Physical, chart, labs and discussed the procedure including the risks, benefits and alternatives for the proposed anesthesia with the patient or authorized representative who has indicated his/her understanding and acceptance.     Dental advisory given  Plan Discussed with:   Anesthesia Plan Comments: (Follows with Dr. Fletcher Anon for hx of PAD  s/p recent right SFA endovascular intervention for severe right calf claudication on 09/23/19. Complete resolution of claudication with improvement in ABI to normal. Per last note 10/06/19, recommended to continue dual antiplatelet therapy for at least 6 months given the placement of drug-eluting stent but likely will keep her long-term. Pt also had low risk stress test 05/23/18. No history of CAD.  OV note from [facility] scanned in [place] dated 11/11/19 to serve as H&P.  Will need DOS labs and eval.  EKG 09/10/19 (in setting of acute illness): Sinus tachycardia. Rate 1340. Right atrial enlargement. Rightward axis. Nonspecific ST abnormality  EKG 04/21/19: Sinus tach. Rate 107.   Nuclear stress 05/23/18: The left ventricular ejection fraction is hyperdynamic (>65%). Nuclear stress EF: 76%. There was no ST segment deviation noted during stress. The study is normal.   1. EF 76%, normal wall motion.  2. No significant perfusion defect, no evidence for ischemia or infarction.   Normal study. )       Anesthesia Quick Evaluation

## 2019-12-02 NOTE — Progress Notes (Signed)
PCP - [place] Pepper Cardiologist - Arida    Chest x-ray - na EKG - 12/20 Stress Test - 9/19 ECHO -  Cardiac Cath -   Sleep Study - na CPAP -   Fasting Blood Sugar - na Checks Blood Sugar _____ times a day  Blood Thinner Instructions:continue Aspirin Instructions: continue     COVID TEST- 11/30/19   Anesthesia review:   Patient denies shortness of breath, fever, cough and chest pain at PAT call.   All instructions explained to the patient, with a verbal understanding . Patient also instructed to self quarantine after being tested for COVID-19. The opportunity to ask questions was provided.

## 2019-12-02 NOTE — Progress Notes (Signed)
Anesthesia Chart Review: Same day workup  Follows with Dr. Fletcher Anon for hx of PAD s/p recent right SFA endovascular intervention for severe right calf claudication on 09/23/19. Complete resolution of claudication with improvement in ABI to normal. Per last note 10/06/19, recommended to continue dual antiplatelet therapy for at least 6 months given the placement of drug-eluting stent but likely will keep her long-term. Pt also had low risk stress test 05/23/18. No history of CAD.  OV note from [facility] scanned in [place] dated 11/11/19 to serve as H&P.  Will need DOS labs and eval.  EKG 09/10/19 (in setting of acute illness): Sinus tachycardia. Rate 1340. Right atrial enlargement. Rightward axis. Nonspecific ST abnormality  EKG 04/21/19: Sinus tach. Rate 107.   Nuclear stress 05/23/18:  The left ventricular ejection fraction is hyperdynamic (>65%).  Nuclear stress EF: 76%.  There was no ST segment deviation noted during stress.  The study is normal.   1. EF 76%, normal wall motion.  2. No significant perfusion defect, no evidence for ischemia or infarction.   Normal study.    [place], [facility] Stay Center/Anesthesiology Phone [phone] 12/02/2019 1:32 PM

## 2019-12-03 ENCOUNTER — Encounter (HOSPITAL_COMMUNITY): Admission: RE | Disposition: A | Discharge status: Home / Self Care | Payer: Self-pay | Source: Ambulatory Visit

## 2019-12-03 ENCOUNTER — Encounter (HOSPITAL_COMMUNITY): Payer: Self-pay

## 2019-12-03 ENCOUNTER — Ambulatory Visit (HOSPITAL_COMMUNITY)
Admission: RE | Admit: 2019-12-03 | Discharge: 2019-12-03 | Disposition: A | Discharge status: Home / Self Care | Payer: Medicare Other | Source: Ambulatory Visit | Attending: Anesthesiology | Admitting: Anesthesiology

## 2019-12-03 ENCOUNTER — Ambulatory Visit (HOSPITAL_COMMUNITY): Payer: Medicare Other | Admitting: Certified Registered Nurse Anesthetist

## 2019-12-03 ENCOUNTER — Ambulatory Visit (HOSPITAL_COMMUNITY)
Admission: RE | Admit: 2019-12-03 | Discharge: 2019-12-03 | Disposition: A | Discharge status: Home / Self Care | Payer: Medicare Other | Source: Ambulatory Visit | Attending: Physician Assistant | Admitting: Physician Assistant

## 2019-12-03 DIAGNOSIS — R7303 Prediabetes: Secondary | ICD-10-CM | POA: Insufficient documentation

## 2019-12-03 DIAGNOSIS — M542 Cervicalgia: Secondary | ICD-10-CM | POA: Diagnosis not present

## 2019-12-03 DIAGNOSIS — M1990 Unspecified osteoarthritis, unspecified site: Secondary | ICD-10-CM | POA: Insufficient documentation

## 2019-12-03 DIAGNOSIS — M4802 Spinal stenosis, cervical region: Secondary | ICD-10-CM

## 2019-12-03 DIAGNOSIS — Z7982 Long term (current) use of aspirin: Secondary | ICD-10-CM | POA: Insufficient documentation

## 2019-12-03 DIAGNOSIS — Z01812 Encounter for preprocedural laboratory examination: Secondary | ICD-10-CM | POA: Diagnosis not present

## 2019-12-03 DIAGNOSIS — R011 Cardiac murmur, unspecified: Secondary | ICD-10-CM | POA: Insufficient documentation

## 2019-12-03 DIAGNOSIS — K219 Gastro-esophageal reflux disease without esophagitis: Secondary | ICD-10-CM | POA: Insufficient documentation

## 2019-12-03 DIAGNOSIS — M797 Fibromyalgia: Secondary | ICD-10-CM | POA: Insufficient documentation

## 2019-12-03 DIAGNOSIS — I739 Peripheral vascular disease, unspecified: Secondary | ICD-10-CM | POA: Insufficient documentation

## 2019-12-03 DIAGNOSIS — Z7902 Long term (current) use of antithrombotics/antiplatelets: Secondary | ICD-10-CM | POA: Insufficient documentation

## 2019-12-03 DIAGNOSIS — Z79899 Other long term (current) drug therapy: Secondary | ICD-10-CM | POA: Insufficient documentation

## 2019-12-03 DIAGNOSIS — Z20822 Contact with and (suspected) exposure to covid-19: Secondary | ICD-10-CM | POA: Diagnosis not present

## 2019-12-03 DIAGNOSIS — E785 Hyperlipidemia, unspecified: Secondary | ICD-10-CM | POA: Insufficient documentation

## 2019-12-03 DIAGNOSIS — F17210 Nicotine dependence, cigarettes, uncomplicated: Secondary | ICD-10-CM | POA: Insufficient documentation

## 2019-12-03 DIAGNOSIS — I10 Essential (primary) hypertension: Secondary | ICD-10-CM | POA: Insufficient documentation

## 2019-12-03 DIAGNOSIS — G8929 Other chronic pain: Secondary | ICD-10-CM | POA: Insufficient documentation

## 2019-12-03 HISTORY — DX: Other complications of anesthesia, initial encounter: T88.59XA

## 2019-12-03 HISTORY — PX: RADIOLOGY WITH ANESTHESIA: SHX6223

## 2019-12-03 LAB — CBC
HCT: 40.8 % (ref 36.0–46.0)
Hemoglobin: 13 g/dL (ref 12.0–15.0)
MCH: 29.9 pg (ref 26.0–34.0)
MCHC: 31.9 g/dL (ref 30.0–36.0)
MCV: 93.8 fL (ref 80.0–100.0)
Platelets: 223 10*3/uL (ref 150–400)
RBC: 4.35 MIL/uL (ref 3.87–5.11)
RDW: 15.1 % (ref 11.5–15.5)
WBC: 7.2 10*3/uL (ref 4.0–10.5)
nRBC: 0 % (ref 0.0–0.2)

## 2019-12-03 LAB — BASIC METABOLIC PANEL
Anion gap: 9 (ref 5–15)
BUN: 23 mg/dL (ref 8–23)
CO2: 24 mmol/L (ref 22–32)
Calcium: 9.6 mg/dL (ref 8.9–10.3)
Chloride: 107 mmol/L (ref 98–111)
Creatinine, Ser: 1.02 mg/dL — ABNORMAL HIGH (ref 0.44–1.00)
GFR calc Af Amer: >60 mL/min (ref >60)
GFR calc non Af Amer: 56 mL/min — ABNORMAL LOW (ref >60)
Glucose, Bld: 99 mg/dL (ref 70–99)
Potassium: 3.9 mmol/L (ref 3.5–5.1)
Sodium: 140 mmol/L (ref 135–145)

## 2019-12-03 SURGERY — MRI WITH ANESTHESIA
Anesthesia: General

## 2019-12-03 MED ORDER — GADOBUTROL 1 MMOL/ML IV SOLN
8.0000 mL | Freq: Once | INTRAVENOUS | Status: AC | PRN
  Administered 2019-12-03: 8 mL via INTRAVENOUS

## 2019-12-03 MED ORDER — MIDAZOLAM HCL 5 MG/5ML IJ SOLN
INTRAMUSCULAR | Status: DC | PRN
  Administered 2019-12-03: 2 mg via INTRAVENOUS

## 2019-12-03 MED ORDER — PHENYLEPHRINE 40 MCG/ML (10ML) SYRINGE FOR IV PUSH (FOR BLOOD PRESSURE SUPPORT)
PREFILLED_SYRINGE | INTRAVENOUS | Status: DC | PRN
  Administered 2019-12-03: 60 ug via INTRAVENOUS

## 2019-12-03 MED ORDER — LIDOCAINE 2% (20 MG/ML) 5 ML SYRINGE
INTRAMUSCULAR | Status: DC | PRN
  Administered 2019-12-03: 60 mg via INTRAVENOUS

## 2019-12-03 MED ORDER — LACTATED RINGERS IV SOLN: INTRAVENOUS | Status: DC

## 2019-12-03 MED ORDER — SUGAMMADEX SODIUM 200 MG/2ML IV SOLN
INTRAVENOUS | Status: DC | PRN
  Administered 2019-12-03: 300 mg via INTRAVENOUS

## 2019-12-03 MED ORDER — ONDANSETRON HCL 4 MG/2ML IJ SOLN
INTRAMUSCULAR | Status: DC | PRN
  Administered 2019-12-03: 4 mg via INTRAVENOUS

## 2019-12-03 MED ORDER — DEXAMETHASONE SODIUM PHOSPHATE 10 MG/ML IJ SOLN
INTRAMUSCULAR | Status: DC | PRN
  Administered 2019-12-03: 10 mg via INTRAVENOUS

## 2019-12-03 MED ORDER — ROCURONIUM BROMIDE 10 MG/ML (PF) SYRINGE
PREFILLED_SYRINGE | INTRAVENOUS | Status: DC | PRN
  Administered 2019-12-03: 50 mg via INTRAVENOUS

## 2019-12-03 MED ORDER — PROPOFOL 10 MG/ML IV BOLUS
INTRAVENOUS | Status: DC | PRN
  Administered 2019-12-03: 200 mg via INTRAVENOUS

## 2019-12-03 MED ORDER — FENTANYL CITRATE (PF) 250 MCG/5ML IJ SOLN
INTRAMUSCULAR | Status: DC | PRN
  Administered 2019-12-03: 50 ug via INTRAVENOUS

## 2019-12-03 NOTE — Anesthesia Postprocedure Evaluation (Signed)
Anesthesia Post Note  Patient: Amy Bruce  Procedure(s) Performed: MRI WITH ANESTHESIA CERVICAL WITH AND WITHOUT CONTRAST (N/A )     Patient location during evaluation: PACU Anesthesia Type: General Level of consciousness: awake and alert Pain management: pain level controlled Vital Signs Assessment: post-procedure vital signs reviewed and stable Respiratory status: spontaneous breathing, nonlabored ventilation and respiratory function stable Cardiovascular status: blood pressure returned to baseline and stable Postop Assessment: no apparent nausea or vomiting Anesthetic complications: no    Last Vitals:  Vitals:   12/03/19 1157 12/03/19 1200  BP: (!) 146/67   Pulse: 83   Resp: 15   Temp:  (!) 36.4 C  SpO2:      Last Pain:  Vitals:   12/03/19 1157  TempSrc:   PainSc: Asleep                 Lidia Collum

## 2019-12-03 NOTE — Transfer of Care (Signed)
Immediate Anesthesia Transfer of Care Note  Patient: Amy Bruce  Procedure(s) Performed: MRI WITH ANESTHESIA CERVICAL WITH AND WITHOUT CONTRAST (N/A )  Patient Location: PACU  Anesthesia Type:General  Level of Consciousness: awake, alert  and oriented  Airway & Oxygen Therapy: Patient Spontanous Breathing and Patient connected to nasal cannula oxygen  Post-op Assessment: Report given to RN, Post -op Vital signs reviewed and stable and Patient moving all extremities X 4  Post vital signs: Reviewed and stable   Last Vitals:  Vitals Value Taken Time  BP 134/62 12/03/19 1148  Temp 36.4 C 12/03/19 1145  Pulse 80 12/03/19 1148  Resp 15 12/03/19 1148  SpO2 100 % 12/03/19 1148  Vitals shown include unvalidated device data.  Last Pain:  Vitals:   12/03/19 1145  TempSrc:   PainSc: Asleep         Complications: No apparent anesthesia complications

## 2019-12-03 NOTE — Progress Notes (Signed)
MRI form sent to Radiology via tube station.

## 2019-12-03 NOTE — Anesthesia Procedure Notes (Signed)
Procedure Name: Intubation Date/Time: 12/03/2019 10:37 AM Performed by: Mariea Clonts, CRNA Pre-anesthesia Checklist: Patient identified, Emergency Drugs available, Suction available and Patient being monitored Patient Re-evaluated:Patient Re-evaluated prior to induction Oxygen Delivery Method: Circle System Utilized Preoxygenation: Pre-oxygenation with 100% oxygen Induction Type: IV induction Ventilation: Mask ventilation without difficulty and Oral airway inserted - appropriate to patient size Laryngoscope Size: Sabra Heck and 2 Grade View: Grade III Tube type: Oral Number of attempts: 3 Airway Equipment and Method: Stylet,  Oral airway and Bougie stylet Placement Confirmation: ETT inserted through vocal cords under direct vision,  positive ETCO2 and breath sounds checked- equal and bilateral Tube secured with: Tape Dental Injury: Teeth and Oropharynx as per pre-operative assessment  Difficulty Due To: Difficulty was unanticipated, Difficult Airway- due to reduced neck mobility and Difficult Airway- due to large tongue Future Recommendations: Recommend- induction with short-acting agent, and alternative techniques readily available

## 2019-12-10 ENCOUNTER — Ambulatory Visit: Payer: Self-pay | Admitting: Podiatry

## 2019-12-15 ENCOUNTER — Telehealth: Payer: Self-pay | Admitting: *Deleted

## 2019-12-15 NOTE — Telephone Encounter (Signed)
   Primary Cardiologist: Kathlyn Sacramento, MD  Chart reviewed as part of pre-operative protocol coverage. Patient was contacted 12/15/2019 in reference to pre-operative risk assessment for pending surgery as outlined below.  ARRIANA ZAMUDIO was last seen on 10/06/19 by Dr. Fletcher Anon for close follow-up after orbital atherectomy to the proximal and mid SFA followed by drug-coated balloon angioplasty 09/23/19. She was recommended to uninterrupted DAPT with aspirin and clopidogrel for 6 months.    At this time, recommend delaying spinal injection until  03/2020. Patient was notified of this recommendation and in agreement with the plan.  Pre-op covering staff:  - Please contact requesting surgeon's office via preferred method (i.e, phone, fax) to inform them of need to delay surgery until after 03/27/20. Please ask that they re-submit a clearance request closer to her surgery date for ongoing preoperative assessment.   Abigail Butts, PA-C 12/15/2019, 12:48 PM

## 2019-12-15 NOTE — Telephone Encounter (Signed)
   [facility] Pre-operative Risk Assessment    Request for surgical clearance:  1. What type of surgery is being performed? Cervical epidural steroid injection   2. When is this surgery scheduled? TBD   3. What type of clearance is required (medical clearance vs. Pharmacy clearance to hold med vs. Both)? Medical  4. Are there any medications that need to be held prior to surgery and how long?Plavix for 7 days   5. Practice name and name of physician performing surgery? [facility] Dr Clydell Hakim   6. What is your office phone number [phone]    7.   What is your office fax number [phone]  8.   Anesthesia type (None, local, MAC, general) ? Georgiann Hahn 12/15/2019, 11:20 AM  _________________________________________________________________   (provider comments below)

## 2019-12-15 NOTE — Telephone Encounter (Signed)
[facility] and leave detailed message about pt procedure, advised to give office a called back.

## 2019-12-16 ENCOUNTER — Other Ambulatory Visit: Payer: Self-pay | Admitting: *Deleted

## 2019-12-16 NOTE — Telephone Encounter (Signed)
Left detailed message for [facility] surgery scheduler for Dr. Maryjean Ka. Amy Bruce was last seen on 10/06/19 by Dr. Fletcher Anon for close follow-up after orbital atherectomy to the proximal and mid SFA followed by drug-coated balloon angioplasty 09/23/19. She was recommended to uninterrupted DAPT with aspirin and clopidogrel for 6 months.    At this time, recommend delaying spinal injection until  03/2020. Patient was notified of this recommendation and in agreement with the plan.  I have asked for a call back to confirm our messages have been received.

## 2019-12-17 NOTE — Telephone Encounter (Signed)
I have faxed notes to Dr. Maryjean Ka with the recommendations to postpone procedure. See notes. I will remove from the pre op call back pool.

## 2019-12-21 ENCOUNTER — Ambulatory Visit: Payer: Self-pay | Admitting: Podiatry

## 2019-12-28 ENCOUNTER — Encounter: Payer: Medicare Other | Attending: Physical Medicine & Rehabilitation | Admitting: Physical Medicine & Rehabilitation

## 2019-12-28 DIAGNOSIS — G8929 Other chronic pain: Secondary | ICD-10-CM | POA: Insufficient documentation

## 2019-12-28 DIAGNOSIS — G894 Chronic pain syndrome: Secondary | ICD-10-CM | POA: Insufficient documentation

## 2019-12-28 DIAGNOSIS — M25511 Pain in right shoulder: Secondary | ICD-10-CM | POA: Insufficient documentation

## 2019-12-28 DIAGNOSIS — M5412 Radiculopathy, cervical region: Secondary | ICD-10-CM | POA: Insufficient documentation

## 2020-01-11 ENCOUNTER — Other Ambulatory Visit: Payer: Self-pay | Admitting: Family Medicine

## 2020-01-11 DIAGNOSIS — E2839 Other primary ovarian failure: Secondary | ICD-10-CM

## 2020-02-02 ENCOUNTER — Other Ambulatory Visit: Payer: Self-pay | Admitting: Physical Medicine & Rehabilitation

## 2020-02-03 ENCOUNTER — Other Ambulatory Visit: Payer: Self-pay | Admitting: Physical Medicine & Rehabilitation

## 2020-02-04 ENCOUNTER — Telehealth: Payer: Self-pay | Admitting: Cardiovascular Disease

## 2020-02-04 NOTE — Telephone Encounter (Signed)
Called patient about message. Patient complaining of ankle and feet swelling. Patient stated it hurts to stretch her feet and they are discolored. This has been going on for 4 to 5 days. Patient denies any SOB or chest pain. Will send message to Dr. Fletcher Anon and his nurse for advisement.

## 2020-02-04 NOTE — Telephone Encounter (Signed)
Pt c/o swelling: STAT is pt has developed SOB within 24 hours  1) How much weight have you gained and in what time span? No weight gain  2) If swelling, where is the swelling located? Both feet and ankles (Mainly right foot / ankle)  3) Are you currently taking a fluid pill? No  4) Are you currently SOB? No  5) Do you have a log of your daily weights (if so, list)? No  6) Have you gained 3 pounds in a day or 5 pounds in a week? No  7) Have you traveled recently? No

## 2020-02-04 NOTE — Telephone Encounter (Signed)
Schedule her for an urgent ABI and lower extremity arterial duplex to be done on Friday, May 28.

## 2020-02-05 ENCOUNTER — Other Ambulatory Visit (HOSPITAL_COMMUNITY): Payer: Self-pay | Admitting: Psychiatry

## 2020-02-05 ENCOUNTER — Other Ambulatory Visit (HOSPITAL_COMMUNITY): Payer: Self-pay | Admitting: Cardiovascular Disease

## 2020-02-05 ENCOUNTER — Ambulatory Visit (HOSPITAL_COMMUNITY)
Admission: RE | Admit: 2020-02-05 | Discharge: 2020-02-05 | Disposition: A | Discharge status: Home / Self Care | Payer: Medicare Other | Source: Ambulatory Visit | Attending: Cardiovascular Disease | Admitting: Cardiovascular Disease

## 2020-02-05 ENCOUNTER — Other Ambulatory Visit: Payer: Self-pay

## 2020-02-05 DIAGNOSIS — I739 Peripheral vascular disease, unspecified: Secondary | ICD-10-CM | POA: Diagnosis present

## 2020-02-05 NOTE — Telephone Encounter (Signed)
Spoke to patient's daughter Rogers Seeds she will let mother know of her doppler appointment this afternoon at 3:00 pm.

## 2020-02-05 NOTE — Telephone Encounter (Signed)
Called patient left message on personal voice mail Dr.Arida's advice.Lower ext arterial dopplers with ABI's scheduled this afternoon at 3:00 pm.

## 2020-02-09 ENCOUNTER — Other Ambulatory Visit: Payer: Medicare Other

## 2020-02-16 ENCOUNTER — Ambulatory Visit: Payer: Medicare Other | Admitting: Cardiovascular Disease

## 2020-02-25 ENCOUNTER — Other Ambulatory Visit: Payer: Self-pay | Admitting: Physical Medicine & Rehabilitation

## 2020-03-08 ENCOUNTER — Ambulatory Visit (INDEPENDENT_AMBULATORY_CARE_PROVIDER_SITE_OTHER): Payer: Medicare Other | Admitting: Cardiovascular Disease

## 2020-03-08 ENCOUNTER — Other Ambulatory Visit: Payer: Self-pay

## 2020-03-08 ENCOUNTER — Encounter: Payer: Self-pay | Admitting: Cardiovascular Disease

## 2020-03-08 VITALS — BP 130/78 | HR 92 | Temp 96.8°F | Ht 60.0 in | Wt 177.6 lb

## 2020-03-08 DIAGNOSIS — I10 Essential (primary) hypertension: Secondary | ICD-10-CM

## 2020-03-08 DIAGNOSIS — I872 Venous insufficiency (chronic) (peripheral): Secondary | ICD-10-CM

## 2020-03-08 DIAGNOSIS — E785 Hyperlipidemia, unspecified: Secondary | ICD-10-CM

## 2020-03-08 DIAGNOSIS — Z720 Tobacco use: Secondary | ICD-10-CM

## 2020-03-08 DIAGNOSIS — I739 Peripheral vascular disease, unspecified: Secondary | ICD-10-CM

## 2020-03-08 NOTE — Patient Instructions (Addendum)
Medication Instructions:  No changes *If you need a refill on your cardiac medications before your next appointment, please call your pharmacy*   Lab Work: None ordered If you have labs (blood work) drawn today and your tests are completely normal, you will receive your results only by: Marland Kitchen MyChart Message (if you have MyChart) OR . A paper copy in the mail If you have any lab test that is abnormal or we need to change your treatment, we will call you to review the results.   Testing/Procedures: None ordered   Follow-Up: At [facility], you and your health needs are our priority.  As part of our continuing mission to provide you with exceptional heart care, we have created designated Provider Care Teams.  These Care Teams include your primary Cardiologist (physician) and Advanced Practice Providers (APPs -  Physician Assistants and Nurse Practitioners) who all work together to provide you with the care you need, when you need it.  We recommend signing up for the patient portal called "MyChart".  Sign up information is provided on this After Visit Summary.  MyChart is used to connect with patients for Virtual Visits (Telemedicine).  Patients are able to view lab/test results, encounter notes, upcoming appointments, etc.  Non-urgent messages can be sent to your provider as well.   To learn more about what you can do with MyChart, go to [email or web address].    Your next appointment:   6 month(s)  The format for your next appointment:   In Person  Provider:   Kathlyn Sacramento, MD   Other Instructions Please wear knee high compression stockings during the day.

## 2020-03-08 NOTE — Progress Notes (Signed)
Cardiology Office Note   Date:  03/08/2020   ID:  Amy Bruce, DOB 11/15/1950, MRN [id number]  PCP:  London Pepper, MD  Cardiologist:   Kathlyn Sacramento, MD   No chief complaint on file.     History of Present Illness: Amy Bruce is a 69 y.o. female who is here today for a follow-up visit regarding peripheral arterial disease.  She has known history of peripheral arterial disease, tobacco use, essential hypertension and hyperlipidemia.  Previous CT imaging showed evidence of aortic and coronary calcifications.  She has prolonged history of plantar fasciitis. She has known history of peripheral arterial disease with moderately reduced ABI on the right at 0.54 and mild on the left at 0.81.  Angiography in July 2019 showed diffuse right SFA disease with medium length occlusion in the mid to distal segment.  On the left there was moderate ostial left SFA stenosis with two-vessel runoff below the knee.  She was referred to vascular surgery to consider right femoral-popliteal bypass but medical therapy was pursued after.   Lexiscan Myoview in September 2019 showed no evidence of ischemia with normal ejection fraction. She had back surgery done last year.  She had worsening claudication earlier this year.  Angiography was done in January which showed no significant change from last year.  I performed successful orbital atherectomy to the proximal and mid right SFA followed by drug-coated balloon angioplasty.  The distal SFA was treated with drug-eluting stent given that the reentry was subintimal.  Postprocedure ABI improved to normal with mildly decreased toe pressure. She cut down on tobacco use after the procedure but could not quit completely.  Most recent vascular studies last month showed an ABI of 0.83 bilaterally.  Right lower extremity arterial duplex showed moderate stenosis affecting the common femoral and mid SFA.The stent in the SFA was patent with no significant restenosis.   On the left, there was moderate common femoral artery stenosis.  She had previous back surgery in August 2020 and she suffers from significant neuropathic pain.  She is currently on Lyrica.  In addition, she had recent worsening of bilateral leg swelling and edema worse on the right side.  This is usually worse at the end of the day and is associated with significant discomfort in both legs described as aching sensation that happens at rest and with walking.  Different from her previous claudication.  Past Medical History:  Diagnosis Date  . Anemia    in the past  . Anxiety    yrs. ago panic attacks  . Arthritis   . 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 Complication of anesthesia    pt. reported her father became deaf after having anesthesia  . Fibromyalgia   . GERD (gastroesophageal reflux disease)   . Heart murmur    due to rheumatic fever, PCP no longer hears it  . Hyperlipidemia   . Hypertension   . Myalgia   . Neuropathy   . PAD (peripheral artery disease) ([place])   . PAD (peripheral artery disease) ([place])   . Pre-diabetes   . Pyelonephritis 09/10/2019    "septic"  . Tobacco abuse   . 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 ABDOMINAL AORTOGRAM W/LOWER EXTREMITY N/A 04/02/2018   Procedure: ABDOMINAL AORTOGRAM W/LOWER EXTREMITY;  Surgeon: Wellington Hampshire, MD;  Location: [place] [facility];  Service: Cardiovascular;  Laterality: N/A;  . ABDOMINAL AORTOGRAM W/LOWER EXTREMITY N/A 09/23/2019   Procedure: ABDOMINAL AORTOGRAM W/LOWER EXTREMITY;  Surgeon: Wellington Hampshire, MD;  Location: [place] [facility];  Service: Cardiovascular;  Laterality: N/A;  . ABDOMINAL HYSTERECTOMY  1999  . CERVICAL SPINE SURGERY  0160,1093   C5/6 6/7  . COLONOSCOPY    . ECTOPIC PREGNANCY SURGERY    . Left axillary cyst removal 1989    . MULTIPLE EXTRACTIONS WITH ALVEOLOPLASTY N/A 10/25/2017   Procedure: EXTRACTION  NUMBERS FIVE, SEVEN, EIGHT, NINE, TEN, FIFTEEN, EIGHTEEN WITH ALVEOLOPLASTY, IRRIGATION AND DEBRIDEMENT LEFT SUBMANDIBULAR INFECTION;  Surgeon: Diona Browner, DDS;  Location: [place];  Service: Oral Surgery;  Laterality: N/A;  . PERIPHERAL VASCULAR ATHERECTOMY Right 09/23/2019   Procedure: PERIPHERAL VASCULAR ATHERECTOMY;  Surgeon: Wellington Hampshire, MD;  Location: [place] [facility];  Service: Cardiovascular;  Laterality: Right;  SFA  . PERIPHERAL VASCULAR BALLOON ANGIOPLASTY Right 09/23/2019   Procedure: PERIPHERAL VASCULAR BALLOON ANGIOPLASTY;  Surgeon: Wellington Hampshire, MD;  Location: [place] [facility];  Service: Cardiovascular;  Laterality: Right;  SFA  . RADIOLOGY WITH ANESTHESIA N/A 11/18/2018   Procedure: MRI OF THE CERVICAL SPINE WITHOUT CONTRAST;  Surgeon: Radiologist, Medication, MD;  Location: [place];  Service: Radiology;  Laterality: N/A;  . RADIOLOGY WITH ANESTHESIA N/A 12/03/2019   Procedure: MRI WITH ANESTHESIA CERVICAL WITH AND WITHOUT CONTRAST;  Surgeon: Radiologist, Medication, MD;  Location: [place];  Service: Radiology;  Laterality: N/A;  . TONSILECTOMY, ADENOIDECTOMY, BILATERAL MYRINGOTOMY AND TUBES  1981   put not aware of tubes being put in ears  . TUBAL LIGATION  1981     Current Outpatient Medications  Medication Sig Dispense Refill  . albuterol (VENTOLIN HFA) 108 (90 Base) MCG/ACT inhaler Inhale 1-2 puffs into the lungs every 4 (four) hours as needed for wheezing. 18 g 5  . amitriptyline (ELAVIL) 50 MG tablet TAKE 1 TABLET(50 MG) BY MOUTH AT BEDTIME 30 tablet 1  . aspirin EC 81 MG tablet Take 81 mg by mouth daily.    Marland Kitchen atorvastatin (LIPITOR) 40 MG tablet Take 40 mg by mouth daily.     . calcium carbonate (TUMS - DOSED IN MG ELEMENTAL CALCIUM) 500 MG chewable tablet Chew 2 tablets by mouth 2 (two) times daily as needed for indigestion or heartburn.     . Camphor-Eucalyptus-Menthol (VICKS VAPORUB EX) Apply 1 application topically daily as needed (congestion).    . cetirizine  (ZYRTEC) 10 MG tablet Take 10 mg by mouth daily as needed for allergies.    Marland Kitchen clopidogrel (PLAVIX) 75 MG tablet Take 1 tablet (75 mg total) by mouth daily. 30 tablet 11  . cyclobenzaprine (FLEXERIL) 10 MG tablet Take 1 tablet (10 mg total) by mouth 3 (three) times daily as needed for muscle spasms. (Patient taking differently: Take 10 mg by mouth 3 (three) times daily. ) 90 tablet 1  . diclofenac (VOLTAREN) 50 MG EC tablet TAKE 1 TABLET(50 MG) BY MOUTH THREE TIMES DAILY 90 tablet 1  . lidocaine (LIDODERM) 5 % UNWRAP AND APPLY 1 PATCH TO SKIN DAILY. REMOVE AND DISCARD PATCH WITHIN 12 HOURS OR AS DIRECTED BY MD. (Patient taking differently: Place 1 patch onto the skin daily as needed (pain.). UNWRAP AND APPLY 1 PATCH TO SKIN DAILY. REMOVE AND DISCARD PATCH WITHIN 12 HOURS OR AS DIRECTED BY MD.) 30 patch 1  . nicotine (NICODERM CQ - DOSED IN MG/24 HOURS) 21 mg/24hr patch Place 21 mg onto the skin daily as  needed (smoking cessation).    . nicotine polacrilex (NICORETTE) 2 MG lozenge Take 2 mg by mouth as needed for smoking cessation (Max 20 lozenges per day).     Marland Kitchen oxyCODONE-acetaminophen (PERCOCET) 7.5-325 MG tablet Take 1 tablet by mouth every 4 (four) hours as needed for severe pain.    . pregabalin (LYRICA) 200 MG capsule TAKE ONE CAPSULE BY MOUTH IN THE MORNING, AT NOON AND AT BEDTIME 90 capsule 0  . tiZANidine (ZANAFLEX) 4 MG tablet TAKE 1/2 TABLET(2 MG) BY MOUTH THREE TIMES DAILY 45 tablet 1  . vitamin B-12 (CYANOCOBALAMIN) 1000 MCG tablet Take 1,000 mcg by mouth daily.     No current facility-administered medications for this visit.    Allergies:   Carisoprodol and Duloxetine hcl    Social History:  The patient  reports that she has been smoking cigarettes. She has a 33.00 pack-year smoking history. She has never used smokeless tobacco. She reports current alcohol use of about 2.0 standard drinks of alcohol per week. She reports that she does not use drugs.   Family History:  The patient's  family history includes Ovarian cancer in her mother.    ROS:  Please see the history of present illness.   Otherwise, review of systems are positive for none.   All other systems are reviewed and negative.    PHYSICAL EXAM: VS:  BP 130/78   Pulse 92   Temp (!) 96.8 F (36 C)   Ht 5' (1.524 m)   Wt 177 lb 9.6 oz (80.6 kg)   SpO2 95%   BMI 34.69 kg/m  , BMI Body mass index is 34.69 kg/m. GEN: Well nourished, well developed, in no acute distress  HEENT: normal  Neck: no JVD, carotid bruits, or masses Cardiac: RRR; no murmurs, rubs, or gallops Respiratory:  clear to auscultation bilaterally, normal work of breathing GI: soft, nontender, nondistended, + BS MS: no deformity or atrophy  Skin: warm and dry, no rash Neuro:  Strength and sensation are intact Psych: euthymic mood, full affect Vascular: Femoral pulses are +1 bilaterally.  She has faint dorsalis pedis bilaterally.  There is mild bilateral leg edema worse on the right side.    EKG:  EKG is ordered today. EKG showed normal sinus rhythm with no significant ST or T wave changes.  Recent Labs: 09/10/2019: ALT 18 12/03/2019: BUN 23; Creatinine, Ser 1.02; Hemoglobin 13.0; Platelets 223; Potassium 3.9; Sodium 140    Lipid Panel No results found for: CHOL, TRIG, HDL, CHOLHDL, VLDL, LDLCALC, LDLDIRECT    Wt Readings from Last 3 Encounters:  03/08/20 177 lb 9.6 oz (80.6 kg)  12/03/19 172 lb (78 kg)  11/30/19 173 lb (78.5 kg)       No flowsheet data found.    ASSESSMENT AND PLAN:  1. Peripheral arterial disease: Status post recent right SFA endovascular intervention for severe right calf claudication.  Current bilateral leg pain and swelling does not seem to be due to claudication.  I recommend continuing medical therapy for PAD with plans to repeat Doppler studies in May of next year.  2.  Tobacco use: She cut down to 1 to 2 cigarettes a day and is planning to quit completely.  3.  Hyperlipidemia: Continue  atorvastatin with a target LDL of less than 70.  4.  Essential hypertension: Blood pressure is reasonably controlled on current medications.  5.  Chronic venous insufficiency: I suspect that her current leg symptoms are due to this based on her description.  I advised her to elevate her legs frequently during the day and I will also prescribe low pressure knee-high support stockings 15 to 20 mmHg to be used during the day.   Disposition:   FU with me in 6 months.  Signed,  Kathlyn Sacramento, MD  03/08/2020 9:13 AM    [place]

## 2020-04-02 ENCOUNTER — Other Ambulatory Visit: Payer: Self-pay | Admitting: Cardiovascular Disease

## 2020-04-02 ENCOUNTER — Other Ambulatory Visit: Payer: Self-pay | Admitting: Physical Medicine & Rehabilitation

## 2020-04-03 ENCOUNTER — Other Ambulatory Visit: Payer: Self-pay | Admitting: Physical Medicine & Rehabilitation

## 2020-04-04 ENCOUNTER — Telehealth: Payer: Self-pay | Admitting: Acute Care

## 2020-04-04 ENCOUNTER — Other Ambulatory Visit: Payer: Self-pay

## 2020-04-04 ENCOUNTER — Ambulatory Visit (INDEPENDENT_AMBULATORY_CARE_PROVIDER_SITE_OTHER): Payer: Medicare Other | Admitting: Acute Care

## 2020-04-04 ENCOUNTER — Encounter: Payer: Self-pay | Admitting: Acute Care

## 2020-04-04 VITALS — BP 130/70 | HR 79 | Temp 97.3°F | Ht 60.0 in | Wt 184.2 lb

## 2020-04-04 DIAGNOSIS — J309 Allergic rhinitis, unspecified: Secondary | ICD-10-CM

## 2020-04-04 DIAGNOSIS — F17210 Nicotine dependence, cigarettes, uncomplicated: Secondary | ICD-10-CM

## 2020-04-04 DIAGNOSIS — Z720 Tobacco use: Secondary | ICD-10-CM

## 2020-04-04 NOTE — Telephone Encounter (Signed)
Needs follow-up.  Thanks

## 2020-04-04 NOTE — Telephone Encounter (Signed)
Please make sure patient is scheduled with me to review her PFT's once they have been completed. Thanks so much.

## 2020-04-04 NOTE — Progress Notes (Signed)
History of Present Illness Amy Bruce is a 69 y.o. female current every day smoker followed by the lung cancer screening program. She needs to be assigned a primary pulmonary physician within the practice. .    04/04/2020  Pt. Presents for an acute visit. She Is followed by the screening program, but she has not officially been followed through the practice. She presents today with some  complaints of some post nasal drip and cough. She had one episode where she coughed up some green sputum. She has not coughed up green sputum since. She did attempt to bring a sputum culture, but she had placed it in the freezer , therefore it is not acceptable for culture. She also has complaints of bladder infection symptoms. She has been hospitalized in the past with this, and I have asked her to follow up with her PCP today for treatment.  She states she has had a lot of post nasal drip which has been making her cough. She states she has had this since the first of the year. She states she has been taking a PPI for reflux. We discussed that this could be contributing to cough. She does see Dr. Kalman Shan . She was previously taking Prilosec, but she said she was switched at the last visit.  Pt. States she uses her rescue inhaler 1-2 times a month. She plans on starting walking , so she wonders if she will need it more then.  She states she does have some shortness of breath with activity. She does get short of breath when climbing steps. She denies any shortness of breath when carrying groceries.  She denies any wheezing. No fever. No sick contacts.  We discussed using a maintenance inhaler if her PFT's indicate an asthma component to her bronchitis/ allergic rhinitis.  We will determine need once PFT's have been reviewed.  She was not wheezing today on exam.   Test Results: Low Dose CT 05/14/2019 Lungs/Pleura: Biapical pleuroparenchymal scarring noted. Centrilobular emphsyema noted. Patchy ground-glass  attenuation is noted in the central right upper lobe. No new suspicious pulmonary nodule or mass. No focal airspace consolidation. No pleural Effusion. Lung-RADS 1, negative. Continue annual screening with low-dose chest CT without contrast in 12 months. 2. 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 Atypical infection would be a consideration. 3.  Emphysema. (ICD10-J43.9) 4.  Aortic Atherosclerois (ICD10-170.0)   CBC Latest Ref Rng & Units 12/03/2019 09/10/2019 11/18/2018  WBC 4.0 - 10.5 K/uL 7.2 11.8(H) 8.1  Hemoglobin 12.0 - 15.0 g/dL 13.0 12.3 11.6(L)  Hematocrit 36 - 46 % 40.8 36.8 36.5  Platelets 150 - 400 K/uL 223 208 292    BMP Latest Ref Rng & Units 12/03/2019 09/10/2019 11/18/2018  Glucose 70 - 99 mg/dL 99 115(H) 117(H)  BUN 8 - 23 mg/dL 23 10 14   Creatinine 0.44 - 1.00 mg/dL 1.02(H) 1.02(H) 1.05(H)  BUN/Creat Ratio 12 - 28 - - -  Sodium 135 - 145 mmol/L 140 137 138  Potassium 3.5 - 5.1 mmol/L 3.9 4.2 3.5  Chloride 98 - 111 mmol/L 107 100 103  CO2 22 - 32 mmol/L 24 25 23   Calcium 8.9 - 10.3 mg/dL 9.6 9.5 9.3    BNP No results found for: BNP  ProBNP No results found for: PROBNP  PFT No results found for: FEV1PRE, FEV1POST, FVCPRE, FVCPOST, TLC, DLCOUNC, PREFEV1FVCRT, PSTFEV1FVCRT  No results found.   Past medical hx Past Medical History:  Diagnosis Date  . Anemia    in the past  . Anxiety    yrs. ago panic attacks  . Arthritis   . 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 Complication of anesthesia    pt. reported her father became deaf after having anesthesia  . Fibromyalgia   . GERD (gastroesophageal reflux disease)   . Heart murmur    due to rheumatic fever, PCP no longer hears it  . Hyperlipidemia   . Hypertension   . Myalgia   . Neuropathy   . PAD (peripheral artery disease) ([place])   . PAD (peripheral artery disease) ([place])   .  Pre-diabetes   . Pyelonephritis 09/10/2019    "septic"  . Tobacco abuse   . 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 Smoking status: Current Every Day Smoker    Packs/day: 1.00    Years: 33.00    Pack years: 33.00    Types: Cigarettes  . Smokeless tobacco: Never Used  . Tobacco comment: 2 ciggs a day  Vaping Use  . Vaping Use: Never used  Substance Use Topics  . Alcohol use: Yes    Alcohol/week: 2.0 standard drinks    Types: 2 Shots of liquor per week    Comment: socially  . Drug use: No    Amy Bruce reports that she has been smoking cigarettes. She has a 33.00 pack-year smoking history. She has never used smokeless tobacco. She reports current alcohol use of about 2.0 standard drinks of alcohol per week. She reports that she does not use drugs.  Tobacco Cessation: Pt. Smokes 2 cigarettes a day.   Past surgical hx, Family hx, Social hx all reviewed.  Current Outpatient Medications on File Prior to Visit  Medication Sig  . albuterol (VENTOLIN HFA) 108 (90 Base) MCG/ACT inhaler Inhale 1-2 puffs into the lungs every 4 (four) hours as needed for wheezing.  Marland Kitchen amitriptyline (ELAVIL) 50 MG tablet TAKE 1 TABLET(50 MG) BY MOUTH AT BEDTIME  . aspirin EC 81 MG tablet Take 81 mg by mouth daily.  Marland Kitchen atorvastatin (LIPITOR) 40 MG tablet Take 80 mg by mouth daily.   . calcium carbonate (TUMS - DOSED IN MG ELEMENTAL CALCIUM) 500 MG chewable tablet Chew 2 tablets by mouth 2 (two) times daily as needed for indigestion or heartburn.   . Camphor-Eucalyptus-Menthol (VICKS VAPORUB EX) Apply 1 application topically daily as needed (congestion).  . cetirizine (ZYRTEC) 10 MG tablet Take 10 mg by mouth daily as needed for allergies.  Marland Kitchen clopidogrel (PLAVIX) 75 MG tablet Take 1 tablet (75 mg total) by mouth daily.  . cyclobenzaprine (FLEXERIL) 10 MG tablet Take 1 tablet (10 mg total) by mouth 3 (three) times daily as needed for muscle spasms. (Patient taking  differently: Take 10 mg by mouth 3 (three) times daily. )  . diclofenac (VOLTAREN) 50 MG EC tablet TAKE 1 TABLET(50 MG) BY MOUTH THREE TIMES DAILY  . lidocaine (LIDODERM) 5 % UNWRAP AND APPLY 1 PATCH TO SKIN DAILY. REMOVE AND DISCARD PATCH WITHIN 12 HOURS OR AS DIRECTED BY MD. (Patient taking differently: Place 1 patch onto the skin daily as needed (pain.). UNWRAP AND APPLY 1 PATCH TO SKIN DAILY. REMOVE AND DISCARD PATCH WITHIN 12 HOURS OR AS DIRECTED BY MD.)  . nicotine (NICODERM CQ - DOSED IN MG/24 HOURS) 21 mg/24hr patch Place 21 mg onto the skin daily as  needed (smoking cessation).  . nicotine polacrilex (NICORETTE) 2 MG lozenge Take 2 mg by mouth as needed for smoking cessation (Max 20 lozenges per day).   Marland Kitchen oxyCODONE-acetaminophen (PERCOCET) 7.5-325 MG tablet Take 1 tablet by mouth every 4 (four) hours as needed for severe pain.  Marland Kitchen tiZANidine (ZANAFLEX) 4 MG tablet TAKE 1/2 TABLET(2 MG) BY MOUTH THREE TIMES DAILY  . vitamin B-12 (CYANOCOBALAMIN) 1000 MCG tablet Take 1,000 mcg by mouth daily.  . pregabalin (LYRICA) 200 MG capsule TAKE 1 CAPSULE BY MOUTH EVERY MORNING, 1 CAPSULE AT NOON AND 1 CAPSULE AT BEDTIME   No current facility-administered medications on file prior to visit.     Allergies  Allergen Reactions  . Carisoprodol Swelling  . Duloxetine Hcl Swelling and Hypertension    Review Of Systems:  Constitutional:   No  weight loss, night sweats,  Fevers, chills, fatigue, or  lassitude.  HEENT:   No headaches,  Difficulty swallowing,  Tooth/dental problems, or  Sore throat,                No sneezing, itching, ear ache, nasal congestion, + post nasal drip,   CV:  No chest pain,  Orthopnea, PND, swelling in lower extremities, anasarca, dizziness, palpitations, syncope.   GI  No heartburn, indigestion, abdominal pain, nausea, vomiting, diarrhea, change in bowel habits, loss of appetite, bloody stools.   Resp: No shortness of breath with exertion or at rest.  + excess mucus, +  productive cough,  + non-productive cough,  No coughing up of blood.  + change in color of mucus.  No wheezing.  No chest wall deformity  Skin: no rash or lesions.  GU: no dysuria, change in color of urine, no urgency or frequency.  No flank pain, no hematuria   MS:  No joint pain or swelling.  No decreased range of motion.  No back pain.  Psych:  No change in mood or affect. No depression or anxiety.  No memory loss.   Vital Signs BP (!) 130/70 (BP Location: Left Arm, Cuff Size: Normal)   Pulse 79   Temp (!) 97.3 F (36.3 C)   Ht 5' (1.524 m)   Wt 184 lb 3.2 oz (83.6 kg)   SpO2 98%   BMI 35.97 kg/m    Physical Exam:  General- No distress,  A&Ox3, pleasantt ENT: No sinus tenderness, TM clear, pale nasal mucosa, no oral exudate,no post nasal drip, no LAN Cardiac: S1, S2, regular rate and rhythm, no murmur Chest: No wheeze/ rales/ dullness; no accessory muscle use, no nasal flaring, no sternal retractions Abd.: Soft Non-tender, ND, BS + Ext: No clubbing cyanosis, edema Neuro:  normal strength, MAE x 4, A&O x 3 Skin: No rashes, No lesions, warm and dry Psych: normal mood and behavior   Assessment/Plan  Bronchitis/ Allergic Rhinitis Plan Restart your Allegra daily. Restart your Flonase daily. Sips of water instead of throat clearing Sugar Free [employer] or [employer] originals for throat soothing. Avoid mint, menthol. These can make reflux worse.  Delsym Cough syrup 5 cc's every 12 hours Continue your reflux medication.  We will give you a copy of the GERD diet, so you know which foods to avoid.  Lung Cancer Screening CT>> you will get a call to schedule this in Sept.  We will order PFT's  Follow up with Judson Roch NP after PFT's Keep working on quitting smoking. This is the single most powerful action you can take to decrease your risk of lung cancer,  heart disease, stroke and worsening pulmonary function.  Please re-collect sputum specimen.  We need to set you up  with a Pulmonary MD on the group.  Please contact office for sooner follow up if symptoms do not improve or worsen or seek emergency care    I will get CBC with diff and Eosinophils at follow up if PFT's indicate asthma. .    Tobacco Abuse Continues to smoke 2 cigarettes daily Plan Counseled to quit smoking entirely Lung Cancer Screening Scan due September 2021  Will assign to Dr. Shearon Stalls as Primary Pulmonary MD.  Will schedule to see her after follow up PFT's.  Amy Spatz, NP 04/04/2020  5:48 PM

## 2020-04-04 NOTE — Patient Instructions (Addendum)
It is good to see you today. Restart your Allegra daily. Restart your Flonase daily. Sips of water instead of throat clearing Sugar Free [employer] or [employer] originals for throat soothing. Avoid mint, menthol. These can make reflux worse.  Delsym Cough syrup 5 cc's every 12 hours Continue your reflux medication.  We will give you a copy of the GERD diet, so you know which foods to avoid.  Lung Cancer Screening CT>> you will get a call to schedule this in Sept.  We will order PFT's  Follow up with Judson Roch NP after PFT's Keep working on quitting smoking. This is the single most powerful action you can take to decrease your risk of lung cancer, heart disease, stroke and worsening pulmonary function.  Please re-collect sputum specimen.  We need to set you up with a Pulmonary MD on the group.  Please contact office for sooner follow up if symptoms do not improve or worsen or seek emergency care

## 2020-04-04 NOTE — Telephone Encounter (Signed)
Patient is scheduled for PFT and follow up with you 04/27/20

## 2020-04-04 NOTE — Telephone Encounter (Signed)
Thanks so much. 

## 2020-04-08 ENCOUNTER — Other Ambulatory Visit: Payer: Self-pay | Admitting: Physical Medicine & Rehabilitation

## 2020-04-13 ENCOUNTER — Ambulatory Visit
Admission: RE | Admit: 2020-04-13 | Discharge: 2020-04-13 | Disposition: A | Discharge status: Home / Self Care | Payer: Medicare Other | Source: Ambulatory Visit | Attending: Family Medicine | Admitting: Family Medicine

## 2020-04-13 ENCOUNTER — Other Ambulatory Visit: Payer: Self-pay

## 2020-04-13 DIAGNOSIS — E2839 Other primary ovarian failure: Secondary | ICD-10-CM

## 2020-04-14 ENCOUNTER — Encounter: Payer: Self-pay | Admitting: Physical Medicine & Rehabilitation

## 2020-04-14 ENCOUNTER — Other Ambulatory Visit: Payer: Self-pay

## 2020-04-14 ENCOUNTER — Encounter: Payer: Medicare Other | Attending: Physical Medicine & Rehabilitation | Admitting: Physical Medicine & Rehabilitation

## 2020-04-14 VITALS — BP 115/73 | HR 81 | Temp 98.8°F | Ht 60.0 in | Wt 175.4 lb

## 2020-04-14 DIAGNOSIS — M47812 Spondylosis without myelopathy or radiculopathy, cervical region: Secondary | ICD-10-CM | POA: Insufficient documentation

## 2020-04-14 DIAGNOSIS — R202 Paresthesia of skin: Secondary | ICD-10-CM | POA: Diagnosis present

## 2020-04-14 DIAGNOSIS — M542 Cervicalgia: Secondary | ICD-10-CM | POA: Insufficient documentation

## 2020-04-14 DIAGNOSIS — G8929 Other chronic pain: Secondary | ICD-10-CM | POA: Diagnosis present

## 2020-04-14 DIAGNOSIS — R200 Anesthesia of skin: Secondary | ICD-10-CM | POA: Diagnosis present

## 2020-04-14 DIAGNOSIS — M722 Plantar fascial fibromatosis: Secondary | ICD-10-CM | POA: Insufficient documentation

## 2020-04-14 DIAGNOSIS — M792 Neuralgia and neuritis, unspecified: Secondary | ICD-10-CM

## 2020-04-14 DIAGNOSIS — Z720 Tobacco use: Secondary | ICD-10-CM | POA: Diagnosis present

## 2020-04-14 DIAGNOSIS — G5601 Carpal tunnel syndrome, right upper limb: Secondary | ICD-10-CM | POA: Insufficient documentation

## 2020-04-14 DIAGNOSIS — G894 Chronic pain syndrome: Secondary | ICD-10-CM | POA: Insufficient documentation

## 2020-04-14 DIAGNOSIS — Z981 Arthrodesis status: Secondary | ICD-10-CM | POA: Diagnosis present

## 2020-04-14 NOTE — Progress Notes (Signed)
Subjective:    Patient ID: Amy Bruce, female    DOB: 04/29/1951, 69 y.o.   MRN: [id number]  HPI  Female with pmh/psh ofHTN, anemia, B12 deficiency, rheumatic fever, cervical fusion present for follow up for general pain.   Initially stated: Present since fusion 1991, getting worse in last couple weeks.  She fractured her humerus in 2016 and states she was slinged.  No alleviating factors. Everything exacerbates the pain.  Sharp pain.  Radiates to hands.  She has associated numbness.  Ibu does not help. Narco, flexaril helps. Denies falls. Pain limits from doing ADLs. Pt notes she had similar symptoms on left side before first fusion. Pt relocated 12/2014 from [place], [place] where she was being followed by Pain clinic.    Last clinic visit 11/30/2019.  Since that time, patient states she saw several specialists. She had an MRI, which showed improvement disc protrusion, unchanged left C7-T1 foraminal narrowing, severe. She notes R>L foot edema, she is following up with Cards. She notes improvement in gait.  She had a fall, falling asleep from her chair. She had good benefit with Lyrica. She had good benefit with Tizanidine. Good benefit with Elavil. She continues to smoke.  She notes improvement in plantar fascitis.  She notes improvement in CTS.   Pain Inventory Average Pain 8 Pain Right Now 6 My pain is sharp, burning, stabbing and tingling  In the last 24 hours, has pain interfered with the following? General activity 6 Relation with others 0 Enjoyment of life 6 What TIME of day is your pain at its worst? all Sleep (in general) Good  Pain is worse with: bending, sitting and some activites Pain improves with: rest, medication and TENS Relief from Meds: 5  Mobility walk without assistance ability to climb steps?  yes do you drive?  yes transfers alone  Function not employed: date last employed .  Neuro/Psych weakness numbness tingling spasms  Prior Studies Any changes  since last visit?  no  Physicians involved in your care Primary care [employer] . GI   No pertinent family history of neck/shoulder pain. Social History   Socioeconomic History  . Marital status: Divorced    Spouse name: Not on file  . Number of children: Not on file  . Years of education: Not on file  . Highest education level: Not on file  Occupational History  . Not on file  Tobacco Use  . Smoking status: Current Every Day Smoker    Packs/day: 1.00    Years: 33.00    Pack years: 33.00    Types: Cigarettes  . Smokeless tobacco: Never Used  . Tobacco comment: 2 ciggs a day  Vaping Use  . Vaping Use: Never used  Substance and Sexual Activity  . Alcohol use: Yes    Alcohol/week: 2.0 standard drinks    Types: 2 Shots of liquor per week    Comment: socially  . Drug use: No  . Sexual activity: Not on file  Other Topics Concern  . Not on file  Social History Narrative  . Not on file   Social Determinants of Health   Financial Resource Strain:   . Difficulty of Paying Living Expenses:   Food Insecurity:   . Worried About Charity fundraiser in the Last Year:   . Arboriculturist in the Last Year:   Transportation Needs:   . Film/video editor (Medical):   Marland Kitchen Lack of Transportation (Non-Medical):   Physical Activity:   .  Days of Exercise per Week:   . Minutes of Exercise per Session:   Stress:   . Feeling of Stress :   Social Connections:   . Frequency of Communication with Friends and Family:   . Frequency of Social Gatherings with Friends and Family:   . Attends Religious Services:   . Active Member of Clubs or Organizations:   . Attends Archivist Meetings:   Marland Kitchen Marital Status:    Past surgical history: Cervical fusion. Past medical history: HTN, anemia, B12 deficiency, rheumatic fever, chronic pain BP 115/73   Pulse 81   Temp 98.8 F (37.1 C)   Ht 5' (1.524 m)   Wt 175 lb 6.4 oz (79.6 kg)   SpO2 94%   BMI 34.26 kg/m   Opioid  Risk Score:   Fall Risk Score:  `1  Depression screen PHQ 2/9  Depression screen [facility] 2/9 11/20/2018 12/12/2016 11/08/2016 10/11/2016  Decreased Interest 0 0 0 0  Down, Depressed, Hopeless 0 0 0 0  PHQ - 2 Score 0 0 0 0  Altered sleeping - - - 0  Tired, decreased energy - - - 0  Change in appetite - - - 0  Feeling bad or failure about yourself  - - - 0  Trouble concentrating - - - 0  Moving slowly or fidgety/restless - - - 0  Suicidal thoughts - - - 0  PHQ-9 Score - - - 0  Difficult doing work/chores - - - Not difficult at all   Review of Systems  Constitutional: Negative.        Weight gain in relatively short amount of time  HENT: Negative.   Eyes: Negative.   Respiratory: Negative.   Cardiovascular: Negative.   Gastrointestinal: Negative.   Endocrine: Negative.   Genitourinary: Negative.   Musculoskeletal: Positive for arthralgias, back pain, gait problem, myalgias, neck pain and neck stiffness.       Spasms  Skin:       Pruritis  Allergic/Immunologic: Negative.   Neurological: Positive for weakness and numbness.       Tingling   Hematological: Bruises/bleeds easily.  Psychiatric/Behavioral: Negative.       Objective:   Physical Exam  Constitutional: NAD. Vital signs reviewed. Respiratory: Normal effort.   Psych: Normal mood.  Normal behavior. Musc: Right shoulder and left neck TTP.  Neuro: Alert              Strength 4+/5 in all UE myotomes (pain inhibition)    Assessment & Plan:  Female with pmh/psh of  HTN, anemia, B12 deficiency, rheumatic fever, cervical fusion present for follow up of general pain.    1. 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  Per report canal stenosis as well as cord compression at C4-C5.  Per report 1. 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 Severe right and moderately severe left foraminal narrowing are also seen at this level. 2. 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 Mild to moderate foraminal narrowing at this level is worse on the right. 3. Facet arthropathy and uncovertebral disease at C7-T1 cause severe left and moderately severe right foraminal narrowing. The central canal is patent. 4. Status post C5-7 fusion.  The central canal and foramina  are open.  Repeat MRI 11/2019, showing improvement disc protrusion, unchanged left C7-T1 foraminal narrowing, severe.   Now s/p Left C3-5 discectomy and fusion  Previous NCS/EMG showing right subacute/chronic C5 radic.   No benefit with Medrol dose pack  Heat/Cold, Baclofen, Lidoderm patch, Mobic ineffective, unable to tolerate Cymbalta  Xray reviewed, showing mild chronic changes  Not cleared for HEP by Neurosurg  Continue TENS  Continue Lyrica 200 BID  Cont diclofenac 50 TID, may continue per Vascular   Continue Flexaril 10 TID   Continue Elavil back to 50 qhs (no obvious association with HR after decrease)    2. Myalgia and muscle spasms  Will consider Trigger point injections in future if necessary  See #1  3. Right CTS  See #1  Continue brace qhs  4. Tobacco abuse  Continues to smoke 1 cig/day  Cont to follow up with PCP for gum/meds/etc  Encourage cessation again  5. Hx of post herpatic neuralgia  See #1  6. PVD  Limiting mobility  B/l LE  S/p stenting right RFA  7. Plantar fascitis  Cont follow up with podiatry  Encourage stretching, frozen bottle, bracing  Continue TENS  8. Tachycardia; ?Improving  Cont follow up with Cardiology  Cont Elavil

## 2020-04-27 ENCOUNTER — Ambulatory Visit (INDEPENDENT_AMBULATORY_CARE_PROVIDER_SITE_OTHER): Payer: Medicare Other | Admitting: Internal Medicine

## 2020-04-27 ENCOUNTER — Other Ambulatory Visit: Payer: Self-pay

## 2020-04-27 ENCOUNTER — Ambulatory Visit (INDEPENDENT_AMBULATORY_CARE_PROVIDER_SITE_OTHER): Payer: Medicare Other | Admitting: Acute Care

## 2020-04-27 ENCOUNTER — Encounter: Payer: Self-pay | Admitting: Acute Care

## 2020-04-27 VITALS — BP 120/70 | HR 71 | Temp 98.0°F | Ht 60.0 in | Wt 184.4 lb

## 2020-04-27 DIAGNOSIS — R0609 Other forms of dyspnea: Secondary | ICD-10-CM

## 2020-04-27 DIAGNOSIS — K219 Gastro-esophageal reflux disease without esophagitis: Secondary | ICD-10-CM | POA: Diagnosis not present

## 2020-04-27 DIAGNOSIS — R0600 Dyspnea, unspecified: Secondary | ICD-10-CM

## 2020-04-27 DIAGNOSIS — R059 Cough, unspecified: Secondary | ICD-10-CM

## 2020-04-27 DIAGNOSIS — J410 Simple chronic bronchitis: Secondary | ICD-10-CM

## 2020-04-27 DIAGNOSIS — F17210 Nicotine dependence, cigarettes, uncomplicated: Secondary | ICD-10-CM | POA: Diagnosis not present

## 2020-04-27 DIAGNOSIS — Z720 Tobacco use: Secondary | ICD-10-CM | POA: Diagnosis not present

## 2020-04-27 DIAGNOSIS — R5381 Other malaise: Secondary | ICD-10-CM

## 2020-04-27 DIAGNOSIS — R05 Cough: Secondary | ICD-10-CM | POA: Diagnosis not present

## 2020-04-27 DIAGNOSIS — R9389 Abnormal findings on diagnostic imaging of other specified body structures: Secondary | ICD-10-CM

## 2020-04-27 LAB — PULMONARY FUNCTION TEST
DL/VA % pred: 75 %
DL/VA: 3.23 ml/min/mmHg/L
DLCO cor % pred: 69 %
DLCO cor: 11.77 ml/min/mmHg
DLCO unc % pred: 69 %
DLCO unc: 11.77 ml/min/mmHg
FEF 25-75 Post: 1.59 L/sec
FEF 25-75 Pre: 1.23 L/sec
FEF2575-%Change-Post: 28 %
FEF2575-%Pred-Post: 109 %
FEF2575-%Pred-Pre: 84 %
FEV1-%Change-Post: 5 %
FEV1-%Pred-Post: 108 %
FEV1-%Pred-Pre: 102 %
FEV1-Post: 1.65 L
FEV1-Pre: 1.56 L
FEV1FVC-%Change-Post: 1 %
FEV1FVC-%Pred-Pre: 97 %
FEV6-%Change-Post: 4 %
FEV6-%Pred-Post: 114 %
FEV6-%Pred-Pre: 109 %
FEV6-Post: 2.14 L
FEV6-Pre: 2.06 L
FEV6FVC-%Change-Post: 0 %
FEV6FVC-%Pred-Post: 104 %
FEV6FVC-%Pred-Pre: 104 %
FVC-%Change-Post: 4 %
FVC-%Pred-Post: 109 %
FVC-%Pred-Pre: 104 %
FVC-Post: 2.15 L
FVC-Pre: 2.06 L
Post FEV1/FVC ratio: 77 %
Post FEV6/FVC ratio: 100 %
Pre FEV1/FVC ratio: 76 %
Pre FEV6/FVC Ratio: 100 %
RV % pred: 83 %
RV: 1.64 L
TLC % pred: 90 %
TLC: 4.02 L

## 2020-04-27 NOTE — Patient Instructions (Addendum)
It is good to see you today. Your PFT's do not show COPD. This is good news You do have emphysema per your CT chest 05/2019. We will collect sputum and send for culture, fungal and AFB Continue using your rescue inhaler as needed for shortness of breath or wheezing Continue increasing your walking distance as able. Continue working on weight loss. Follow up Low Dose CT 05/2020 as is ordered. We will call you with results  Restart your Allegra daily. Restart your Flonase daily. Sips of water instead of throat clearing Sugar Free [employer] or [employer] originals for throat soothing. Avoid mint, menthol. These can make reflux worse.  Delsym Cough syrup 5 cc's every 12 hours Continue your reflux medication as you have been doing ( 30 minutes before dinner) .  Follow up with Judson Roch NP after sputum Culture has been collected.  Follow up with Dr. Shearon Stalls in 2 months to review CT results and sputum culture results.  to establish primary MD in the group.

## 2020-04-27 NOTE — Progress Notes (Signed)
PFT done today. 

## 2020-04-27 NOTE — Progress Notes (Signed)
History of Present Illness Amy Bruce is a 69 y.o. female  current every day smoker followed by the lung cancer screening program. She needs to be assigned a primary pulmonary physician within the practice.    04/28/2020  Pt. Presents for follow up after PFT's. PFT's today do not show obstruction . She states she does still have an occasional cough, and some secretions that are cloudy, but over all cough has improved.. She has no wheezing. She states she has some shortness of breath with exertion. Specifically with bending down . She does have a rescue inhaler and she uses that about once a month. She has started walking again now that her plantar fascitis has improved. She feels that she walks about 5,000 steps daily.( about 2 miles). She is working on building up her fitness. She has been walking around her apartment. Pt. Has had recent weight gain of about 50 pounds in the last year which is likely contributing to her bendopnea. .  She is following the reflux diet. She states she is on a medication for reflux, but I do not see it on her med list. It was prescribed by her GI MD who is not affiliated with [facility]. She states she is down to 2 cigarettes daily. I congratulated her and told her to keep going with a goal of zero cigarettes daily. She denies any fever, chest pain, orthopnea of hemoptysis.She is due 05/2020 for her annual lung cancer screening.  Test Results: Lung Cancer Screening 05/2019 Lung-RADS 1, negative. Continue annual screening with low-dose chest CT without contrast in 12 months. 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 Atypical infection would be a consideration. Emphysema. (YPP50-D32.9) Aortic Atherosclerois (ICD10-170.0)          CBC Latest Ref Rng & Units 12/03/2019 09/10/2019 11/18/2018  WBC 4.0 - 10.5 K/uL 7.2 11.8(H) 8.1  Hemoglobin 12.0 - 15.0 g/dL  13.0 12.3 11.6(L)  Hematocrit 36 - 46 % 40.8 36.8 36.5  Platelets 150 - 400 K/uL 223 208 292    BMP Latest Ref Rng & Units 12/03/2019 09/10/2019 11/18/2018  Glucose 70 - 99 mg/dL 99 115(H) 117(H)  BUN 8 - 23 mg/dL 23 10 14   Creatinine 0.44 - 1.00 mg/dL 1.02(H) 1.02(H) 1.05(H)  BUN/Creat Ratio 12 - 28 - - -  Sodium 135 - 145 mmol/L 140 137 138  Potassium 3.5 - 5.1 mmol/L 3.9 4.2 3.5  Chloride 98 - 111 mmol/L 107 100 103  CO2 22 - 32 mmol/L 24 25 23   Calcium 8.9 - 10.3 mg/dL 9.6 9.5 9.3    BNP No results found for: BNP  ProBNP No results found for: PROBNP  PFT    Component Value Date/Time   FEV1PRE 1.56 04/27/2020 1441   FEV1POST 1.65 04/27/2020 1441   FVCPRE 2.06 04/27/2020 1441   FVCPOST 2.15 04/27/2020 1441   TLC 4.02 04/27/2020 1441   DLCOUNC 11.77 04/27/2020 1441   PREFEV1FVCRT 76 04/27/2020 1441   PSTFEV1FVCRT 77 04/27/2020 1441    DG BONE DENSITY (DXA)  Result Date: 04/13/2020 EXAM: DUAL X-RAY ABSORPTIOMETRY (DXA) FOR BONE MINERAL DENSITY IMPRESSION: Referring Physician:  London Pepper Your patient completed a BMD test using Lunar IDXA DXA system ( analysis version: 16 ) manufactured by [employer]. Technologist: AW PATIENT: Name: Amy, Bruce Patient ID:  [id number] Birth Date: 02-14-1951 Height:     60.0 in. Sex:         Female  Measured:   04/13/2020 Weight:     173.0 lbs. Indications: Bilateral Ovariectomy (65.51), Estrogen Deficient, flexeril, Lryica, Postmenopausal Fractures: Humerus Treatments: None ASSESSMENT: The BMD measured at Femur Neck Left is 0.929 g/cm2 with a T-score of -0.8. This patient is considered NORMAL according to [facility]) criteria. The scan quality is good. Site Region Measured Date Measured Age YA T-score BMD Significant CHANGE DualFemur Neck Left  04/13/2020    69.9         -0.8    0.929 g/cm2 AP Spine  L1-L4      3/13/2020    69.9         0.6     1.248 g/cm2 DualFemur Total Mean 04/13/2020    69.9         0.7     1.097  g/cm2 World Health Organization [facility]) criteria for post-menopausal, Caucasian Women: Normal       T-score at or above -1 SD Osteopenia   T-score between -1 and -2.5 SD Osteoporosis T-score at or below -2.5 SD RECOMMENDATION: 1. All patients should optimize calcium and vitamin D intake. 2. Consider FDA approved medical therapies in postmenopausal women and men aged 37 years and older, based on the following: a. A hip or vertebral (clinical or morphometric) fracture b. T-score < or = -2.5 at the femoral neck or spine after appropriate evaluation to exclude secondary causes c. Low bone mass (T-score between -1.0 and -2.5 at the femoral neck or spine) and a 10 year probability of a hip fracture > or = 3% or a 10 year probability of a major osteoporosis-related fracture > or = 20% based on the [place]-adapted WHO algorithm d. Clinician judgment and/or patient preferences may indicate treatment for people with 10-year fracture probabilities above or below these levels FOLLOW-UP: Patients with diagnosis of osteoporosis or at high risk for fracture should have regular bone mineral density tests. For patients eligible for Medicare, routine testing is allowed once every 2 years. The testing frequency can be increased to one year for patients who have rapidly progressing disease, those who are receiving or discontinuing medical therapy to restore bone mass, or have additional risk factors. I have reviewed this report and agree with the above findings. Mark A. Thornton Papas, M.D. [facility] Electronically Signed   By: Lavonia Dana M.D.   On: 04/13/2020 08:47     Past medical hx Past Medical History:  Diagnosis Date  . Anemia    in the past  . Anxiety    yrs. ago panic attacks  . Arthritis   . 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 Complication of anesthesia    pt. reported her father became deaf after having anesthesia  . Fibromyalgia   . GERD (gastroesophageal reflux disease)   . Heart murmur     due to rheumatic fever, PCP no longer hears it  . Hyperlipidemia   . Hypertension   . Myalgia   . Neuropathy   . PAD (peripheral artery disease) ([place])   . PAD (peripheral artery disease) ([place])   . Pre-diabetes   . Pyelonephritis 09/10/2019    "septic"  . Tobacco abuse   . 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 Smoking status: Current Every Day Smoker    Packs/day: 1.00    Years: 33.00    Pack years: 33.00    Types: Cigarettes  .  Smokeless tobacco: Never Used  . Tobacco comment: 2 ciggs a day  Vaping Use  . Vaping Use: Never used  Substance Use Topics  . Alcohol use: Yes    Alcohol/week: 2.0 standard drinks    Types: 2 Shots of liquor per week    Comment: socially  . Drug use: No    Ms.Duce reports that she has been smoking cigarettes. She has a 33.00 pack-year smoking history. She has never used smokeless tobacco. She reports current alcohol use of about 2.0 standard drinks of alcohol per week. She reports that she does not use drugs.  Tobacco Cessation: Current every day smoker. Currently smoking 2 cigarettes daily Working on getting to zero. Past surgical hx, Family hx, Social hx all reviewed.  Current Outpatient Medications on File Prior to Visit  Medication Sig  . albuterol (VENTOLIN HFA) 108 (90 Base) MCG/ACT inhaler Inhale 1-2 puffs into the lungs every 4 (four) hours as needed for wheezing.  Marland Kitchen amitriptyline (ELAVIL) 50 MG tablet TAKE 1 TABLET(50 MG) BY MOUTH AT BEDTIME  . aspirin EC 81 MG tablet Take 81 mg by mouth daily.  Marland Kitchen atorvastatin (LIPITOR) 80 MG tablet Take 80 mg by mouth daily.  . calcium carbonate (TUMS - DOSED IN MG ELEMENTAL CALCIUM) 500 MG chewable tablet Chew 2 tablets by mouth 2 (two) times daily as needed for indigestion or heartburn.   . Camphor-Eucalyptus-Menthol (VICKS VAPORUB EX) Apply 1 application topically daily as needed (congestion).  . clopidogrel (PLAVIX) 75 MG tablet Take 1 tablet  (75 mg total) by mouth daily.  . cyclobenzaprine (FLEXERIL) 10 MG tablet Take 1 tablet (10 mg total) by mouth 3 (three) times daily as needed for muscle spasms. (Patient taking differently: Take 10 mg by mouth 3 (three) times daily. )  . diclofenac (VOLTAREN) 50 MG EC tablet TAKE 1 TABLET(50 MG) BY MOUTH THREE TIMES DAILY  . fexofenadine (ALLEGRA) 180 MG tablet Take 180 mg by mouth daily.  Marland Kitchen lidocaine (LIDODERM) 5 % UNWRAP AND APPLY 1 PATCH TO SKIN DAILY. REMOVE AND DISCARD PATCH WITHIN 12 HOURS OR AS DIRECTED BY MD. (Patient taking differently: Place 1 patch onto the skin daily as needed (pain.). UNWRAP AND APPLY 1 PATCH TO SKIN DAILY. REMOVE AND DISCARD PATCH WITHIN 12 HOURS OR AS DIRECTED BY MD.)  . nicotine (NICODERM CQ - DOSED IN MG/24 HOURS) 21 mg/24hr patch Place 21 mg onto the skin daily as needed (smoking cessation).  . nicotine polacrilex (NICORETTE) 2 MG lozenge Take 2 mg by mouth as needed for smoking cessation (Max 20 lozenges per day).   . Omega-3 Fatty Acids (FISH OIL) 1000 MG CAPS Take 1 capsule by mouth daily.  Marland Kitchen oxyCODONE-acetaminophen (PERCOCET) 7.5-325 MG tablet Take 1 tablet by mouth every 4 (four) hours as needed for severe pain.   . pregabalin (LYRICA) 200 MG capsule TAKE 1 CAPSULE BY MOUTH EVERY MORNING, 1 CAPSULE AT NOON AND 1 CAPSULE AT BEDTIME  . tiZANidine (ZANAFLEX) 4 MG tablet TAKE 1/2 TABLET(2 MG) BY MOUTH THREE TIMES DAILY  . vitamin B-12 (CYANOCOBALAMIN) 1000 MCG tablet Take 1,000 mcg by mouth daily.   No current facility-administered medications on file prior to visit.     Allergies  Allergen Reactions  . Carisoprodol Swelling  . Duloxetine Hcl Swelling and Hypertension    Review Of Systems:  Constitutional:   No  weight loss, night sweats,  Fevers, chills, fatigue, or  lassitude.  HEENT:   No headaches,  Difficulty swallowing,  Tooth/dental problems, or  Sore throat,                No sneezing, itching, ear ache, nasal congestion, post nasal drip,   CV:   No chest pain,  Orthopnea, PND, swelling in lower extremities, anasarca, dizziness, palpitations, syncope.   GI  No heartburn, indigestion, abdominal pain, nausea, vomiting, diarrhea, change in bowel habits, loss of appetite, bloody stools.   Resp: + shortness of breath with exertion or while bending over, not  at rest.  No excess mucus, no productive cough,  No non-productive cough,  No coughing up of blood.  No change in color of mucus.  No wheezing.  No chest wall deformity  Skin: no rash or lesions.  GU: no dysuria, change in color of urine, no urgency or frequency.  No flank pain, no hematuria   MS:  No joint pain or swelling.  No decreased range of motion.  No back pain.  Psych:  No change in mood or affect. No depression or anxiety.  No memory loss.   Vital Signs BP 120/70 (BP Location: Left Arm, Cuff Size: Normal)   Pulse 71   Temp 98 F (36.7 C) (Oral)   Ht 5' (1.524 m)   Wt 184 lb 6.4 oz (83.6 kg)   SpO2 100%   BMI 36.01 kg/m    Physical Exam:  General- No distress,  A&Ox3, pleasant ENT: No sinus tenderness, TM clear, pale nasal mucosa, no oral exudate,no post nasal drip, no LAN Cardiac: S1, S2, regular rate and rhythm, no murmur Chest: No wheeze/ rales/ dullness; no accessory muscle use, no nasal flaring, no sternal retractions Abd.: Soft Non-tender, ND, BS +, Body mass index is 36.01 kg/m. Ext: No clubbing cyanosis, edema Neuro:  normal strength, MAE x 4, A&O x 3 Skin: No rashes, no lesions, warm and dry Psych: normal mood and behavior   Assessment/Plan Bronchitis/ Allergic Rhinitis Stable interval Less frequent cough GERD Plan continue your Allegra daily. Continue  your Flonase daily. Sips of water instead of throat clearing Sugar Free [employer] or [employer] originals for throat soothing. Avoid mint, menthol. These can make reflux worse.  Delsym Cough syrup 5 cc's every 12 hours Continue reflux medication 30 minutes before dinner as you have  been doing   Exertional Dyspnea PFT's do not show Obstruction DLCO is low at 69% Multifactorial including weigh gain of 50 pounds in the last year, and physical deconditioning Plan Continue working on weight loss. Continue increasing your walking distance  We can consider referral to healthy weight and wellness at follow up if you have not been successful in weight loss.  Continue using your rescue inhaler as needed for shortness of breath or wheezing Consider Cardio pulmonary exercise test at follow up  ? Atypical Infection per CT Chest 05/2019 Plan Will get sputum for culture AFB and Fungal Follow Micro  Tobacco Abuse Down to 2 cigarettes daily Followed by Lung Cancer screening Program Plan Annual screening scan due 05/2020 Willl  Also assess for any fibrotic changes at that time  Follow up with Judson Roch NP after sputum Culture has been collected.  Follow up with Dr. Shearon Stalls in 2 months to review CT results and sputum culture results.  to establish primary MD in the group.   Magdalen Spatz, NP 04/28/2020  11:31 AM

## 2020-04-28 ENCOUNTER — Encounter: Payer: Self-pay | Admitting: Acute Care

## 2020-05-20 ENCOUNTER — Other Ambulatory Visit: Payer: Self-pay | Admitting: *Deleted

## 2020-05-20 DIAGNOSIS — Z87891 Personal history of nicotine dependence: Secondary | ICD-10-CM

## 2020-05-20 DIAGNOSIS — F17210 Nicotine dependence, cigarettes, uncomplicated: Secondary | ICD-10-CM

## 2020-05-22 ENCOUNTER — Other Ambulatory Visit: Payer: Self-pay | Admitting: Physical Medicine & Rehabilitation

## 2020-05-26 ENCOUNTER — Encounter: Payer: Self-pay | Admitting: Physical Medicine & Rehabilitation

## 2020-05-26 ENCOUNTER — Other Ambulatory Visit: Payer: Self-pay

## 2020-05-26 ENCOUNTER — Encounter: Payer: Medicare Other | Attending: Physical Medicine & Rehabilitation | Admitting: Physical Medicine & Rehabilitation

## 2020-05-26 VITALS — BP 125/70 | HR 98 | Temp 98.1°F | Ht 60.0 in | Wt 174.0 lb

## 2020-05-26 DIAGNOSIS — F17200 Nicotine dependence, unspecified, uncomplicated: Secondary | ICD-10-CM | POA: Diagnosis not present

## 2020-05-26 DIAGNOSIS — M546 Pain in thoracic spine: Secondary | ICD-10-CM | POA: Insufficient documentation

## 2020-05-26 DIAGNOSIS — G8929 Other chronic pain: Secondary | ICD-10-CM | POA: Insufficient documentation

## 2020-05-26 DIAGNOSIS — G894 Chronic pain syndrome: Secondary | ICD-10-CM | POA: Diagnosis not present

## 2020-05-26 DIAGNOSIS — Z981 Arthrodesis status: Secondary | ICD-10-CM | POA: Insufficient documentation

## 2020-05-26 DIAGNOSIS — M792 Neuralgia and neuritis, unspecified: Secondary | ICD-10-CM | POA: Diagnosis not present

## 2020-05-26 DIAGNOSIS — M542 Cervicalgia: Secondary | ICD-10-CM | POA: Insufficient documentation

## 2020-05-26 DIAGNOSIS — Z720 Tobacco use: Secondary | ICD-10-CM | POA: Insufficient documentation

## 2020-05-26 DIAGNOSIS — M47812 Spondylosis without myelopathy or radiculopathy, cervical region: Secondary | ICD-10-CM | POA: Insufficient documentation

## 2020-05-26 DIAGNOSIS — G5601 Carpal tunnel syndrome, right upper limb: Secondary | ICD-10-CM | POA: Insufficient documentation

## 2020-05-26 DIAGNOSIS — M722 Plantar fascial fibromatosis: Secondary | ICD-10-CM | POA: Insufficient documentation

## 2020-05-26 DIAGNOSIS — R200 Anesthesia of skin: Secondary | ICD-10-CM | POA: Insufficient documentation

## 2020-05-26 DIAGNOSIS — R202 Paresthesia of skin: Secondary | ICD-10-CM | POA: Insufficient documentation

## 2020-05-26 MED ORDER — AMITRIPTYLINE HCL 50 MG PO TABS
ORAL_TABLET | ORAL | 1 refills | Status: DC
Start: 2020-05-26 — End: 2020-07-12
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
## 2020-05-30 ENCOUNTER — Ambulatory Visit: Payer: Medicare Other

## 2020-05-30 ENCOUNTER — Inpatient Hospital Stay: Admission: RE | Admit: 2020-05-30 | Payer: Medicare Other | Source: Ambulatory Visit

## 2020-06-14 ENCOUNTER — Other Ambulatory Visit: Payer: Self-pay | Admitting: Acute Care

## 2020-06-23 ENCOUNTER — Encounter: Payer: Medicare Other | Attending: Physical Medicine & Rehabilitation | Admitting: Physical Medicine & Rehabilitation

## 2020-06-23 DIAGNOSIS — Z720 Tobacco use: Secondary | ICD-10-CM | POA: Insufficient documentation

## 2020-06-23 DIAGNOSIS — M792 Neuralgia and neuritis, unspecified: Secondary | ICD-10-CM | POA: Insufficient documentation

## 2020-06-23 DIAGNOSIS — G894 Chronic pain syndrome: Secondary | ICD-10-CM | POA: Insufficient documentation

## 2020-06-23 DIAGNOSIS — G8929 Other chronic pain: Secondary | ICD-10-CM | POA: Insufficient documentation

## 2020-06-23 DIAGNOSIS — M542 Cervicalgia: Secondary | ICD-10-CM | POA: Insufficient documentation

## 2020-06-23 DIAGNOSIS — Z981 Arthrodesis status: Secondary | ICD-10-CM | POA: Insufficient documentation

## 2020-06-23 DIAGNOSIS — M47812 Spondylosis without myelopathy or radiculopathy, cervical region: Secondary | ICD-10-CM | POA: Insufficient documentation

## 2020-06-23 DIAGNOSIS — M722 Plantar fascial fibromatosis: Secondary | ICD-10-CM | POA: Insufficient documentation

## 2020-06-23 DIAGNOSIS — R202 Paresthesia of skin: Secondary | ICD-10-CM | POA: Insufficient documentation

## 2020-06-23 DIAGNOSIS — G5601 Carpal tunnel syndrome, right upper limb: Secondary | ICD-10-CM | POA: Insufficient documentation

## 2020-06-23 DIAGNOSIS — R200 Anesthesia of skin: Secondary | ICD-10-CM | POA: Insufficient documentation

## 2020-07-09 ENCOUNTER — Ambulatory Visit: Payer: Medicare Other | Attending: Internal Medicine

## 2020-07-09 DIAGNOSIS — Z23 Encounter for immunization: Secondary | ICD-10-CM

## 2020-07-09 NOTE — Progress Notes (Signed)
° °  Covid-19 Vaccination Clinic  Name:  SRI CLEGG    MRN: [id number] DOB: [birth year]  07/09/2020  Ms. Tomlin was observed post Covid-19 immunization for 15 minutes without incident. She was provided with Vaccine Information Sheet and instruction to access the V-Safe system.   Ms. Engelmann was instructed to call 911 with any severe reactions post vaccine:  Difficulty breathing   Swelling of face and throat   A fast heartbeat   A bad rash all over body   Dizziness and weakness

## 2020-07-10 ENCOUNTER — Other Ambulatory Visit: Payer: Self-pay | Admitting: Physical Medicine & Rehabilitation

## 2020-07-12 ENCOUNTER — Other Ambulatory Visit: Payer: Self-pay | Admitting: Physical Medicine & Rehabilitation

## 2020-08-23 ENCOUNTER — Other Ambulatory Visit: Payer: Self-pay

## 2020-08-23 ENCOUNTER — Emergency Department (HOSPITAL_COMMUNITY): Payer: Medicare Other

## 2020-08-23 ENCOUNTER — Emergency Department (HOSPITAL_COMMUNITY): Payer: Medicare Other | Admitting: Certified Registered"

## 2020-08-23 ENCOUNTER — Inpatient Hospital Stay (HOSPITAL_COMMUNITY)
Admission: EM | Admit: 2020-08-23 | Discharge: 2020-09-01 | DRG: 025 | Disposition: A | Discharge status: Rehabilitation Facility | Payer: Medicare Other | Attending: Neurosurgery | Admitting: Neurosurgery

## 2020-08-23 ENCOUNTER — Encounter (HOSPITAL_COMMUNITY): Payer: Self-pay

## 2020-08-23 ENCOUNTER — Encounter: Payer: Self-pay | Admitting: Cardiovascular Disease

## 2020-08-23 ENCOUNTER — Ambulatory Visit (INDEPENDENT_AMBULATORY_CARE_PROVIDER_SITE_OTHER): Payer: Medicare Other | Admitting: Cardiovascular Disease

## 2020-08-23 ENCOUNTER — Inpatient Hospital Stay (HOSPITAL_COMMUNITY): Payer: Medicare Other

## 2020-08-23 ENCOUNTER — Encounter (HOSPITAL_COMMUNITY)
Admission: EM | Disposition: A | Discharge status: Rehabilitation Facility | Payer: Self-pay | Source: Home / Self Care | Attending: Neurosurgery

## 2020-08-23 VITALS — BP 133/77 | HR 111 | Ht 60.0 in | Wt 171.6 lb

## 2020-08-23 DIAGNOSIS — R1310 Dysphagia, unspecified: Secondary | ICD-10-CM | POA: Diagnosis present

## 2020-08-23 DIAGNOSIS — Z720 Tobacco use: Secondary | ICD-10-CM | POA: Diagnosis not present

## 2020-08-23 DIAGNOSIS — S065X0A Traumatic subdural hemorrhage without loss of consciousness, initial encounter: Secondary | ICD-10-CM | POA: Diagnosis present

## 2020-08-23 DIAGNOSIS — Z20822 Contact with and (suspected) exposure to covid-19: Secondary | ICD-10-CM | POA: Diagnosis present

## 2020-08-23 DIAGNOSIS — K219 Gastro-esophageal reflux disease without esophagitis: Secondary | ICD-10-CM | POA: Diagnosis present

## 2020-08-23 DIAGNOSIS — G894 Chronic pain syndrome: Secondary | ICD-10-CM | POA: Diagnosis present

## 2020-08-23 DIAGNOSIS — Z8543 Personal history of malignant neoplasm of ovary: Secondary | ICD-10-CM | POA: Diagnosis not present

## 2020-08-23 DIAGNOSIS — G629 Polyneuropathy, unspecified: Secondary | ICD-10-CM | POA: Diagnosis present

## 2020-08-23 DIAGNOSIS — G935 Compression of brain: Secondary | ICD-10-CM | POA: Diagnosis present

## 2020-08-23 DIAGNOSIS — S065XAA Traumatic subdural hemorrhage with loss of consciousness status unknown, initial encounter: Secondary | ICD-10-CM | POA: Diagnosis present

## 2020-08-23 DIAGNOSIS — I872 Venous insufficiency (chronic) (peripheral): Secondary | ICD-10-CM

## 2020-08-23 DIAGNOSIS — W01198A Fall on same level from slipping, tripping and stumbling with subsequent striking against other object, initial encounter: Secondary | ICD-10-CM | POA: Diagnosis present

## 2020-08-23 DIAGNOSIS — R471 Dysarthria and anarthria: Secondary | ICD-10-CM | POA: Diagnosis present

## 2020-08-23 DIAGNOSIS — R7303 Prediabetes: Secondary | ICD-10-CM | POA: Diagnosis present

## 2020-08-23 DIAGNOSIS — F17210 Nicotine dependence, cigarettes, uncomplicated: Secondary | ICD-10-CM | POA: Diagnosis present

## 2020-08-23 DIAGNOSIS — S0990XA Unspecified injury of head, initial encounter: Secondary | ICD-10-CM

## 2020-08-23 DIAGNOSIS — Z716 Tobacco abuse counseling: Secondary | ICD-10-CM | POA: Diagnosis not present

## 2020-08-23 DIAGNOSIS — S065X0S Traumatic subdural hemorrhage without loss of consciousness, sequela: Secondary | ICD-10-CM | POA: Diagnosis present

## 2020-08-23 DIAGNOSIS — W19XXXA Unspecified fall, initial encounter: Secondary | ICD-10-CM

## 2020-08-23 DIAGNOSIS — Z7982 Long term (current) use of aspirin: Secondary | ICD-10-CM

## 2020-08-23 DIAGNOSIS — S065X2S Traumatic subdural hemorrhage with loss of consciousness of 31 minutes to 59 minutes, sequela: Secondary | ICD-10-CM | POA: Diagnosis not present

## 2020-08-23 DIAGNOSIS — R29810 Facial weakness: Secondary | ICD-10-CM | POA: Diagnosis present

## 2020-08-23 DIAGNOSIS — Z781 Physical restraint status: Secondary | ICD-10-CM | POA: Diagnosis not present

## 2020-08-23 DIAGNOSIS — K5900 Constipation, unspecified: Secondary | ICD-10-CM | POA: Diagnosis not present

## 2020-08-23 DIAGNOSIS — G4700 Insomnia, unspecified: Secondary | ICD-10-CM | POA: Diagnosis present

## 2020-08-23 DIAGNOSIS — G9340 Encephalopathy, unspecified: Secondary | ICD-10-CM | POA: Diagnosis not present

## 2020-08-23 DIAGNOSIS — Y92481 Parking lot as the place of occurrence of the external cause: Secondary | ICD-10-CM

## 2020-08-23 DIAGNOSIS — S065X3D Traumatic subdural hemorrhage with loss of consciousness of 1 hour to 5 hours 59 minutes, subsequent encounter: Secondary | ICD-10-CM | POA: Diagnosis not present

## 2020-08-23 DIAGNOSIS — E785 Hyperlipidemia, unspecified: Secondary | ICD-10-CM

## 2020-08-23 DIAGNOSIS — Z8041 Family history of malignant neoplasm of ovary: Secondary | ICD-10-CM

## 2020-08-23 DIAGNOSIS — R339 Retention of urine, unspecified: Secondary | ICD-10-CM | POA: Diagnosis present

## 2020-08-23 DIAGNOSIS — M797 Fibromyalgia: Secondary | ICD-10-CM | POA: Diagnosis present

## 2020-08-23 DIAGNOSIS — I739 Peripheral vascular disease, unspecified: Secondary | ICD-10-CM

## 2020-08-23 DIAGNOSIS — R4781 Slurred speech: Secondary | ICD-10-CM | POA: Diagnosis present

## 2020-08-23 DIAGNOSIS — S065X9A Traumatic subdural hemorrhage with loss of consciousness of unspecified duration, initial encounter: Secondary | ICD-10-CM | POA: Diagnosis present

## 2020-08-23 DIAGNOSIS — I10 Essential (primary) hypertension: Secondary | ICD-10-CM

## 2020-08-23 DIAGNOSIS — Z807 Family history of other malignant neoplasms of lymphoid, hematopoietic and related tissues: Secondary | ICD-10-CM

## 2020-08-23 DIAGNOSIS — S065X0D Traumatic subdural hemorrhage without loss of consciousness, subsequent encounter: Secondary | ICD-10-CM | POA: Diagnosis not present

## 2020-08-23 DIAGNOSIS — Z79899 Other long term (current) drug therapy: Secondary | ICD-10-CM | POA: Diagnosis not present

## 2020-08-23 DIAGNOSIS — Z0189 Encounter for other specified special examinations: Secondary | ICD-10-CM

## 2020-08-23 DIAGNOSIS — Z888 Allergy status to other drugs, medicaments and biological substances status: Secondary | ICD-10-CM | POA: Diagnosis not present

## 2020-08-23 DIAGNOSIS — G441 Vascular headache, not elsewhere classified: Secondary | ICD-10-CM | POA: Diagnosis present

## 2020-08-23 DIAGNOSIS — J9601 Acute respiratory failure with hypoxia: Secondary | ICD-10-CM | POA: Diagnosis present

## 2020-08-23 DIAGNOSIS — Z6832 Body mass index (BMI) 32.0-32.9, adult: Secondary | ICD-10-CM | POA: Diagnosis not present

## 2020-08-23 DIAGNOSIS — Z7902 Long term (current) use of antithrombotics/antiplatelets: Secondary | ICD-10-CM | POA: Diagnosis not present

## 2020-08-23 DIAGNOSIS — G9341 Metabolic encephalopathy: Secondary | ICD-10-CM | POA: Diagnosis not present

## 2020-08-23 DIAGNOSIS — S065X1S Traumatic subdural hemorrhage with loss of consciousness of 30 minutes or less, sequela: Secondary | ICD-10-CM | POA: Diagnosis not present

## 2020-08-23 DIAGNOSIS — W19XXXS Unspecified fall, sequela: Secondary | ICD-10-CM | POA: Diagnosis present

## 2020-08-23 DIAGNOSIS — F419 Anxiety disorder, unspecified: Secondary | ICD-10-CM | POA: Diagnosis present

## 2020-08-23 DIAGNOSIS — G479 Sleep disorder, unspecified: Secondary | ICD-10-CM | POA: Diagnosis not present

## 2020-08-23 HISTORY — PX: CRANIOTOMY: SHX93

## 2020-08-23 HISTORY — DX: Unspecified injury of head, initial encounter: S09.90XA

## 2020-08-23 LAB — RESP PANEL BY RT-PCR (FLU A&B, COVID) ARPGX2
Influenza A by PCR: NEGATIVE
Influenza B by PCR: NEGATIVE
SARS Coronavirus 2 by RT PCR: NEGATIVE

## 2020-08-23 LAB — POCT I-STAT 7, (LYTES, BLD GAS, ICA,H+H)
Acid-Base Excess: 1 mmol/L (ref 0.0–2.0)
Acid-Base Excess: 2 mmol/L (ref 0.0–2.0)
Bicarbonate: 25.5 mmol/L (ref 20.0–28.0)
Bicarbonate: 27.7 mmol/L (ref 20.0–28.0)
Calcium, Ion: 1.04 mmol/L — ABNORMAL LOW (ref 1.15–1.40)
Calcium, Ion: 1.17 mmol/L (ref 1.15–1.40)
HCT: 27 % — ABNORMAL LOW (ref 36.0–46.0)
HCT: 29 % — ABNORMAL LOW (ref 36.0–46.0)
Hemoglobin: 9.2 g/dL — ABNORMAL LOW (ref 12.0–15.0)
Hemoglobin: 9.9 g/dL — ABNORMAL LOW (ref 12.0–15.0)
O2 Saturation: 100 %
O2 Saturation: 100 %
Patient temperature: 33.5
Patient temperature: 92.6
Potassium: 4 mmol/L (ref 3.5–5.1)
Potassium: 3.6 mmol/L (ref 3.5–5.1)
Sodium: 140 mmol/L (ref 135–145)
Sodium: 142 mmol/L (ref 135–145)
TCO2: 27 mmol/L (ref 22–32)
TCO2: 29 mmol/L (ref 22–32)
pCO2 arterial: 34.4 mmHg (ref 32.0–48.0)
pCO2 arterial: 40.7 mmHg (ref 32.0–48.0)
pH, Arterial: 7.463 — ABNORMAL HIGH (ref 7.350–7.450)
pH, Arterial: 7.426 (ref 7.350–7.450)
pO2, Arterial: 297 mmHg — ABNORMAL HIGH (ref 83.0–108.0)
pO2, Arterial: 279 mmHg — ABNORMAL HIGH (ref 83.0–108.0)

## 2020-08-23 LAB — COMPREHENSIVE METABOLIC PANEL
ALT: 11 U/L (ref 0–44)
AST: 16 U/L (ref 15–41)
Albumin: 3.2 g/dL — ABNORMAL LOW (ref 3.5–5.0)
Alkaline Phosphatase: 65 U/L (ref 38–126)
Anion gap: 9 (ref 5–15)
BUN: 10 mg/dL (ref 8–23)
CO2: 25 mmol/L (ref 22–32)
Calcium: 8.9 mg/dL (ref 8.9–10.3)
Chloride: 103 mmol/L (ref 98–111)
Creatinine, Ser: 0.94 mg/dL (ref 0.44–1.00)
GFR, Estimated: >60 mL/min (ref >60)
Glucose, Bld: 169 mg/dL — ABNORMAL HIGH (ref 70–99)
Potassium: 3.2 mmol/L — ABNORMAL LOW (ref 3.5–5.1)
Sodium: 137 mmol/L (ref 135–145)
Total Bilirubin: 0.4 mg/dL (ref 0.3–1.2)
Total Protein: 6.1 g/dL — ABNORMAL LOW (ref 6.5–8.1)

## 2020-08-23 LAB — CBC WITH DIFFERENTIAL/PLATELET
Abs Immature Granulocytes: 0.03 10*3/uL (ref 0.00–0.07)
Basophils Absolute: 0 10*3/uL (ref 0.0–0.1)
Basophils Relative: 0 %
Eosinophils Absolute: 0 10*3/uL (ref 0.0–0.5)
Eosinophils Relative: 0 %
HCT: 34.6 % — ABNORMAL LOW (ref 36.0–46.0)
Hemoglobin: 11.6 g/dL — ABNORMAL LOW (ref 12.0–15.0)
Immature Granulocytes: 0 %
Lymphocytes Relative: 25 %
Lymphs Abs: 1.9 10*3/uL (ref 0.7–4.0)
MCH: 29.4 pg (ref 26.0–34.0)
MCHC: 33.5 g/dL (ref 30.0–36.0)
MCV: 87.6 fL (ref 80.0–100.0)
Monocytes Absolute: 0.5 10*3/uL (ref 0.1–1.0)
Monocytes Relative: 6 %
Neutro Abs: 5.3 10*3/uL (ref 1.7–7.7)
Neutrophils Relative %: 69 %
Platelets: 189 10*3/uL (ref 150–400)
RBC: 3.95 MIL/uL (ref 3.87–5.11)
RDW: 15.9 % — ABNORMAL HIGH (ref 11.5–15.5)
WBC: 7.8 10*3/uL (ref 4.0–10.5)
nRBC: 0 % (ref 0.0–0.2)

## 2020-08-23 LAB — URINALYSIS, ROUTINE W REFLEX MICROSCOPIC
Bilirubin Urine: NEGATIVE
Glucose, UA: NEGATIVE mg/dL
Hgb urine dipstick: NEGATIVE
Ketones, ur: NEGATIVE mg/dL
Leukocytes,Ua: NEGATIVE
Nitrite: NEGATIVE
Protein, ur: NEGATIVE mg/dL
Specific Gravity, Urine: 1.036 — ABNORMAL HIGH (ref 1.005–1.030)
pH: 6 (ref 5.0–8.0)

## 2020-08-23 LAB — RAPID URINE DRUG SCREEN, HOSP PERFORMED
Amphetamines: NOT DETECTED
Barbiturates: NOT DETECTED
Benzodiazepines: NOT DETECTED
Cocaine: NOT DETECTED
Opiates: NOT DETECTED
Tetrahydrocannabinol: NOT DETECTED

## 2020-08-23 LAB — PROTIME-INR
INR: 0.9 (ref 0.8–1.2)
Prothrombin Time: 12.2 seconds (ref 11.4–15.2)

## 2020-08-23 LAB — TYPE AND SCREEN
ABO/RH(D): A POS
Antibody Screen: NEGATIVE

## 2020-08-23 LAB — ABO/RH: ABO/RH(D): A POS

## 2020-08-23 LAB — MRSA PCR SCREENING: MRSA by PCR: NEGATIVE

## 2020-08-23 LAB — AMMONIA: Ammonia: 31 umol/L (ref 9–35)

## 2020-08-23 LAB — TSH: TSH: 1.65 u[IU]/mL (ref 0.350–4.500)

## 2020-08-23 LAB — GLUCOSE, CAPILLARY: Glucose-Capillary: 141 mg/dL — ABNORMAL HIGH (ref 70–99)

## 2020-08-23 SURGERY — CRANIOTOMY HEMATOMA EVACUATION SUBDURAL
Anesthesia: General | Laterality: Right

## 2020-08-23 MED ORDER — ROCURONIUM BROMIDE 10 MG/ML (PF) SYRINGE
PREFILLED_SYRINGE | INTRAVENOUS | Status: DC | PRN
  Administered 2020-08-23: 30 mg via INTRAVENOUS
  Administered 2020-08-23: 70 mg via INTRAVENOUS

## 2020-08-23 MED ORDER — ONDANSETRON HCL 4 MG/2ML IJ SOLN: 4.0000 mg | INTRAMUSCULAR | Status: DC | PRN

## 2020-08-23 MED ORDER — ACETAMINOPHEN 325 MG PO TABS
650.0000 mg | ORAL_TABLET | ORAL | Status: DC | PRN
  Administered 2020-08-30 – 2020-08-31 (×3): 650 mg via ORAL
  Filled 2020-08-23 (×3): qty 2

## 2020-08-23 MED ORDER — FLEET ENEMA 7-19 GM/118ML RE ENEM: 1.0000 | ENEMA | Freq: Once | RECTAL | Status: DC | PRN

## 2020-08-23 MED ORDER — LIDOCAINE-EPINEPHRINE 1 %-1:100000 IJ SOLN
INTRAMUSCULAR | Status: DC | PRN
  Administered 2020-08-23: 5 mL

## 2020-08-23 MED ORDER — LEVETIRACETAM IN NACL 500 MG/100ML IV SOLN: 500.0000 mg | Freq: Two times a day (BID) | INTRAVENOUS | Status: DC

## 2020-08-23 MED ORDER — SENNA 8.6 MG PO TABS
1.0000 | ORAL_TABLET | Freq: Two times a day (BID) | ORAL | Status: DC
  Administered 2020-08-23: 21:00:00 8.6 mg via ORAL
  Filled 2020-08-23: qty 1

## 2020-08-23 MED ORDER — SODIUM CHLORIDE 0.9% IV SOLUTION: Freq: Once | INTRAVENOUS | Status: DC

## 2020-08-23 MED ORDER — BISACODYL 5 MG PO TBEC: 5.0000 mg | DELAYED_RELEASE_TABLET | Freq: Every day | ORAL | Status: DC | PRN

## 2020-08-23 MED ORDER — NALOXONE HCL 0.4 MG/ML IJ SOLN: 0.0800 mg | INTRAMUSCULAR | Status: DC | PRN

## 2020-08-23 MED ORDER — MIDAZOLAM HCL 2 MG/2ML IJ SOLN
INTRAMUSCULAR | Status: AC
  Filled 2020-08-23: qty 2

## 2020-08-23 MED ORDER — PROPOFOL 1000 MG/100ML IV EMUL
0.0000 ug/kg/min | INTRAVENOUS | Status: DC
  Administered 2020-08-23: 12:00:00 5 ug/kg/min via INTRAVENOUS
  Administered 2020-08-23: 18:00:00 20 ug/kg/min via INTRAVENOUS
  Filled 2020-08-23 (×3): qty 100

## 2020-08-23 MED ORDER — THROMBIN 5000 UNITS EX SOLR
CUTANEOUS | Status: AC
  Filled 2020-08-23: qty 5000

## 2020-08-23 MED ORDER — DEXAMETHASONE SODIUM PHOSPHATE 10 MG/ML IJ SOLN
INTRAMUSCULAR | Status: DC | PRN
  Administered 2020-08-23: 10 mg via INTRAVENOUS

## 2020-08-23 MED ORDER — HYDRALAZINE HCL 20 MG/ML IJ SOLN
5.0000 mg | INTRAMUSCULAR | Status: DC | PRN
  Administered 2020-08-23: 17:00:00 20 mg via INTRAVENOUS
  Administered 2020-08-23: 23:00:00 10 mg via INTRAVENOUS
  Administered 2020-08-24: 04:00:00 20 mg via INTRAVENOUS
  Administered 2020-08-24: 22:00:00 10 mg via INTRAVENOUS
  Administered 2020-08-24 – 2020-08-25 (×3): 20 mg via INTRAVENOUS
  Filled 2020-08-23 (×7): qty 1

## 2020-08-23 MED ORDER — BACITRACIN ZINC 500 UNIT/GM EX OINT
TOPICAL_OINTMENT | CUTANEOUS | Status: AC
  Filled 2020-08-23: qty 28.35

## 2020-08-23 MED ORDER — ATORVASTATIN CALCIUM 80 MG PO TABS: 80.0000 mg | ORAL_TABLET | Freq: Every day | ORAL | Status: DC

## 2020-08-23 MED ORDER — IOHEXOL 300 MG/ML  SOLN
100.0000 mL | Freq: Once | INTRAMUSCULAR | Status: AC | PRN
  Administered 2020-08-23: 100 mL via INTRAVENOUS

## 2020-08-23 MED ORDER — ETOMIDATE 2 MG/ML IV SOLN
INTRAVENOUS | Status: DC | PRN
  Administered 2020-08-23: 20 mg via INTRAVENOUS

## 2020-08-23 MED ORDER — EPHEDRINE SULFATE 50 MG/ML IJ SOLN
INTRAMUSCULAR | Status: DC | PRN
  Administered 2020-08-23 (×3): 5 mg via INTRAVENOUS

## 2020-08-23 MED ORDER — CHLORHEXIDINE GLUCONATE 0.12% ORAL RINSE (MEDLINE KIT)
15.0000 mL | Freq: Two times a day (BID) | OROMUCOSAL | Status: DC
  Administered 2020-08-23 – 2020-08-27 (×8): 15 mL via OROMUCOSAL

## 2020-08-23 MED ORDER — LEVETIRACETAM IN NACL 1000 MG/100ML IV SOLN
1000.0000 mg | Freq: Two times a day (BID) | INTRAVENOUS | Status: DC
  Administered 2020-08-23 – 2020-08-27 (×8): 1000 mg via INTRAVENOUS
  Filled 2020-08-23 (×7): qty 100

## 2020-08-23 MED ORDER — CHLORHEXIDINE GLUCONATE CLOTH 2 % EX PADS
6.0000 | MEDICATED_PAD | Freq: Every day | CUTANEOUS | Status: DC
  Administered 2020-08-23 – 2020-08-31 (×8): 6 via TOPICAL

## 2020-08-23 MED ORDER — FENTANYL CITRATE (PF) 250 MCG/5ML IJ SOLN
INTRAMUSCULAR | Status: DC | PRN
  Administered 2020-08-23 (×2): 50 ug via INTRAVENOUS

## 2020-08-23 MED ORDER — BUPIVACAINE HCL (PF) 0.5 % IJ SOLN
INTRAMUSCULAR | Status: AC
  Filled 2020-08-23: qty 30

## 2020-08-23 MED ORDER — SUCCINYLCHOLINE CHLORIDE 20 MG/ML IJ SOLN
INTRAMUSCULAR | Status: DC | PRN
  Administered 2020-08-23: 100 mg via INTRAVENOUS

## 2020-08-23 MED ORDER — THROMBIN 5000 UNITS EX SOLR
OROMUCOSAL | Status: DC | PRN
  Administered 2020-08-23: 12:00:00 5 mL via TOPICAL

## 2020-08-23 MED ORDER — LIDOCAINE HCL (CARDIAC) PF 100 MG/5ML IV SOSY
PREFILLED_SYRINGE | INTRAVENOUS | Status: AC
  Filled 2020-08-23: qty 5

## 2020-08-23 MED ORDER — ACETAMINOPHEN 325 MG PO TABS: 650.0000 mg | ORAL_TABLET | Freq: Once | ORAL | Status: DC

## 2020-08-23 MED ORDER — CEFAZOLIN SODIUM-DEXTROSE 2-3 GM-%(50ML) IV SOLR
INTRAVENOUS | Status: DC | PRN
  Administered 2020-08-23: 2 g via INTRAVENOUS

## 2020-08-23 MED ORDER — HYDROCODONE-ACETAMINOPHEN 5-325 MG PO TABS: 1.0000 | ORAL_TABLET | ORAL | Status: DC | PRN

## 2020-08-23 MED ORDER — SODIUM CHLORIDE 0.9 % IV SOLN: INTRAVENOUS | Status: DC | PRN

## 2020-08-23 MED ORDER — ONDANSETRON HCL 4 MG/2ML IJ SOLN
INTRAMUSCULAR | Status: DC | PRN
  Administered 2020-08-23: 4 mg via INTRAVENOUS

## 2020-08-23 MED ORDER — BUPIVACAINE HCL (PF) 0.5 % IJ SOLN
INTRAMUSCULAR | Status: DC | PRN
  Administered 2020-08-23: 5 mL

## 2020-08-23 MED ORDER — PROPOFOL 10 MG/ML IV BOLUS
INTRAVENOUS | Status: AC
  Filled 2020-08-23: qty 20

## 2020-08-23 MED ORDER — ONDANSETRON HCL 4 MG PO TABS: 4.0000 mg | ORAL_TABLET | ORAL | Status: DC | PRN

## 2020-08-23 MED ORDER — SUCCINYLCHOLINE CHLORIDE 200 MG/10ML IV SOSY
PREFILLED_SYRINGE | INTRAVENOUS | Status: AC
  Filled 2020-08-23: qty 10

## 2020-08-23 MED ORDER — LIDOCAINE-EPINEPHRINE 1 %-1:100000 IJ SOLN
INTRAMUSCULAR | Status: AC
  Filled 2020-08-23: qty 1

## 2020-08-23 MED ORDER — THROMBIN 20000 UNITS EX SOLR
CUTANEOUS | Status: DC | PRN
  Administered 2020-08-23: 12:00:00 20 mL via TOPICAL

## 2020-08-23 MED ORDER — ORAL CARE MOUTH RINSE
15.0000 mL | OROMUCOSAL | Status: DC
  Administered 2020-08-23 – 2020-08-24 (×7): 15 mL via OROMUCOSAL

## 2020-08-23 MED ORDER — DEXTROSE 5 % IV SOLN: 3.0000 g | INTRAVENOUS | Status: DC

## 2020-08-23 MED ORDER — HYDROMORPHONE HCL 1 MG/ML IJ SOLN
0.5000 mg | INTRAMUSCULAR | Status: DC | PRN
  Administered 2020-08-23 – 2020-08-24 (×4): 1 mg via INTRAVENOUS
  Administered 2020-08-25: 0.5 mg via INTRAVENOUS
  Administered 2020-08-25 (×2): 1 mg via INTRAVENOUS
  Administered 2020-08-25: 0.5 mg via INTRAVENOUS
  Administered 2020-08-25 – 2020-08-27 (×8): 1 mg via INTRAVENOUS
  Administered 2020-08-27: 0.5 mg via INTRAVENOUS
  Administered 2020-08-28: 1 mg via INTRAVENOUS
  Filled 2020-08-23 (×19): qty 1

## 2020-08-23 MED ORDER — ACETAMINOPHEN 650 MG RE SUPP: 650.0000 mg | RECTAL | Status: DC | PRN

## 2020-08-23 MED ORDER — PHENYLEPHRINE HCL-NACL 10-0.9 MG/250ML-% IV SOLN
INTRAVENOUS | Status: DC | PRN
  Administered 2020-08-23: 40 ug/min via INTRAVENOUS

## 2020-08-23 MED ORDER — SODIUM CHLORIDE 0.9 % IV SOLN: INTRAVENOUS | Status: DC

## 2020-08-23 MED ORDER — HEMOSTATIC AGENTS (NO CHARGE) OPTIME
TOPICAL | Status: DC | PRN
  Administered 2020-08-23: 1 via TOPICAL

## 2020-08-23 MED ORDER — THROMBIN 20000 UNITS EX SOLR
CUTANEOUS | Status: AC
  Filled 2020-08-23: qty 20000

## 2020-08-23 MED ORDER — PANTOPRAZOLE SODIUM 40 MG PO TBEC: 40.0000 mg | DELAYED_RELEASE_TABLET | Freq: Every day | ORAL | Status: DC

## 2020-08-23 MED ORDER — CEFAZOLIN SODIUM-DEXTROSE 2-4 GM/100ML-% IV SOLN
2.0000 g | Freq: Three times a day (TID) | INTRAVENOUS | Status: AC
  Administered 2020-08-23 – 2020-08-24 (×2): 2 g via INTRAVENOUS
  Filled 2020-08-23 (×3): qty 100

## 2020-08-23 MED ORDER — FENTANYL CITRATE (PF) 250 MCG/5ML IJ SOLN
INTRAMUSCULAR | Status: AC
  Filled 2020-08-23: qty 5

## 2020-08-23 MED ORDER — PROMETHAZINE HCL 25 MG PO TABS: 12.5000 mg | ORAL_TABLET | ORAL | Status: DC | PRN

## 2020-08-23 MED ORDER — LIDOCAINE HCL (CARDIAC) PF 100 MG/5ML IV SOSY
PREFILLED_SYRINGE | INTRAVENOUS | Status: DC | PRN
  Administered 2020-08-23: 100 mg via INTRAVENOUS

## 2020-08-23 MED ORDER — LABETALOL HCL 5 MG/ML IV SOLN: 10.0000 mg | INTRAVENOUS | Status: DC | PRN

## 2020-08-23 MED ORDER — PANTOPRAZOLE SODIUM 40 MG IV SOLR
40.0000 mg | Freq: Every day | INTRAVENOUS | Status: DC
  Administered 2020-08-23 – 2020-08-25 (×3): 40 mg via INTRAVENOUS
  Filled 2020-08-23 (×3): qty 40

## 2020-08-23 MED ORDER — LABETALOL HCL 5 MG/ML IV SOLN
10.0000 mg | INTRAVENOUS | Status: DC | PRN
  Administered 2020-08-23 – 2020-08-25 (×4): 10 mg via INTRAVENOUS
  Filled 2020-08-23 (×3): qty 4

## 2020-08-23 MED ORDER — PREGABALIN 50 MG PO CAPS
200.0000 mg | ORAL_CAPSULE | Freq: Three times a day (TID) | ORAL | Status: DC
Start: 2020-08-24 — End: 2020-08-26

## 2020-08-23 MED ORDER — ROCURONIUM BROMIDE 10 MG/ML (PF) SYRINGE
PREFILLED_SYRINGE | INTRAVENOUS | Status: AC
  Filled 2020-08-23: qty 10

## 2020-08-23 MED ORDER — AMITRIPTYLINE HCL 25 MG PO TABS: 50.0000 mg | ORAL_TABLET | Freq: Every day | ORAL | Status: DC

## 2020-08-23 MED ORDER — CLOPIDOGREL BISULFATE 75 MG PO TABS: 75.0000 mg | ORAL_TABLET | Freq: Every day | ORAL | 11 refills | Status: DC

## 2020-08-23 MED ORDER — ETOMIDATE 2 MG/ML IV SOLN
INTRAVENOUS | Status: AC
  Filled 2020-08-23: qty 20

## 2020-08-23 MED ORDER — POLYETHYLENE GLYCOL 3350 17 G PO PACK
17.0000 g | PACK | Freq: Every day | ORAL | Status: DC | PRN
  Administered 2020-08-27 – 2020-08-29 (×2): 17 g via ORAL
  Filled 2020-08-23 (×2): qty 1

## 2020-08-23 MED ORDER — 0.9 % SODIUM CHLORIDE (POUR BTL) OPTIME
TOPICAL | Status: DC | PRN
  Administered 2020-08-23 (×3): 1000 mL

## 2020-08-23 MED ORDER — ALBUMIN HUMAN 5 % IV SOLN: INTRAVENOUS | Status: DC | PRN

## 2020-08-23 MED ORDER — DIAZEPAM 5 MG PO TABS: 5.0000 mg | ORAL_TABLET | ORAL | Status: DC

## 2020-08-23 SURGICAL SUPPLY — 83 items
APL SKNCLS STERI-STRIP NONHPOA (GAUZE/BANDAGES/DRESSINGS)
BATTERY IQ STERILE (MISCELLANEOUS) IMPLANT
BENZOIN TINCTURE PRP APPL 2/3 (GAUZE/BANDAGES/DRESSINGS) IMPLANT
BLADE CLIPPER SURG (BLADE) ×3 IMPLANT
BNDG GAUZE ELAST 4 BULKY (GAUZE/BANDAGES/DRESSINGS) IMPLANT
BTRY SRG DRVR 1.5 IQ (MISCELLANEOUS)
BUR ACORN 6.0 PRECISION (BURR) ×2 IMPLANT
BUR ACORN 6.0MM PRECISION (BURR) ×1
BUR MATCHSTICK NEURO 3.0 LAGG (BURR) IMPLANT
BUR SPIRAL ROUTER 2.3 (BUR) ×2 IMPLANT
BUR SPIRAL ROUTER 2.3MM (BUR) ×1
CANISTER SUCT 3000ML PPV (MISCELLANEOUS) ×6 IMPLANT
CARTRIDGE OIL MAESTRO DRILL (MISCELLANEOUS) ×1 IMPLANT
CATH ROBINSON RED A/P 10FR (CATHETERS) ×3 IMPLANT
CATH ROBINSON RED A/P 14FR (CATHETERS) ×3 IMPLANT
CLIP VESOCCLUDE MED 6/CT (CLIP) IMPLANT
COVER BURR HOLE UNIV 10 (Orthopedic Implant) ×3 IMPLANT
COVER WAND RF STERILE (DRAPES) ×3 IMPLANT
DIFFUSER DRILL AIR PNEUMATIC (MISCELLANEOUS) ×3 IMPLANT
DRAIN JACKSON PRATT 10MM FLAT (MISCELLANEOUS) ×3 IMPLANT
DRAPE NEUROLOGICAL W/INCISE (DRAPES) ×3 IMPLANT
DRAPE SURG 17X23 STRL (DRAPES) IMPLANT
DRAPE WARM FLUID 44X44 (DRAPES) ×3 IMPLANT
DRSG TELFA 3X8 NADH (GAUZE/BANDAGES/DRESSINGS) ×3 IMPLANT
DURAPREP 6ML APPLICATOR 50/CS (WOUND CARE) ×3 IMPLANT
ELECT REM PT RETURN 9FT ADLT (ELECTROSURGICAL) ×3
ELECTRODE REM PT RTRN 9FT ADLT (ELECTROSURGICAL) ×1 IMPLANT
EVACUATOR 1/8 PVC DRAIN (DRAIN) IMPLANT
EVACUATOR SILICONE 100CC (DRAIN) ×3 IMPLANT
GAUZE 4X4 16PLY RFD (DISPOSABLE) IMPLANT
GAUZE SPONGE 4X4 12PLY STRL (GAUZE/BANDAGES/DRESSINGS) ×3 IMPLANT
GLOVE BIO SURGEON STRL SZ7.5 (GLOVE) ×6 IMPLANT
GLOVE BIOGEL PI IND STRL 7.5 (GLOVE) ×4 IMPLANT
GLOVE BIOGEL PI INDICATOR 7.5 (GLOVE) ×8
GLOVE ECLIPSE 7.0 STRL STRAW (GLOVE) ×6 IMPLANT
GLOVE EXAM NITRILE XL STR (GLOVE) IMPLANT
GLOVE INDICATOR 7.5 STRL GRN (GLOVE) ×3 IMPLANT
GLOVE SURG SS PI 6.5 STRL IVOR (GLOVE) ×6 IMPLANT
GLOVE SURG UNDER POLY LF SZ6.5 (GLOVE) ×6 IMPLANT
GOWN STRL REUS W/ TWL LRG LVL3 (GOWN DISPOSABLE) ×3 IMPLANT
GOWN STRL REUS W/ TWL XL LVL3 (GOWN DISPOSABLE) ×2 IMPLANT
GOWN STRL REUS W/TWL 2XL LVL3 (GOWN DISPOSABLE) IMPLANT
GOWN STRL REUS W/TWL LRG LVL3 (GOWN DISPOSABLE) ×9
GOWN STRL REUS W/TWL XL LVL3 (GOWN DISPOSABLE) ×6
HEMOSTAT POWDER KIT SURGIFOAM (HEMOSTASIS) ×3 IMPLANT
HEMOSTAT SURGICEL 2X14 (HEMOSTASIS) IMPLANT
IV NS 1000ML (IV SOLUTION) ×3
IV NS 1000ML BAXH (IV SOLUTION) ×1 IMPLANT
KIT BASIN OR (CUSTOM PROCEDURE TRAY) ×3 IMPLANT
KIT TURNOVER KIT B (KITS) ×3 IMPLANT
NEEDLE HYPO 22GX1.5 SAFETY (NEEDLE) ×3 IMPLANT
NS IRRIG 1000ML POUR BTL (IV SOLUTION) ×9 IMPLANT
OIL CARTRIDGE MAESTRO DRILL (MISCELLANEOUS) ×3
PACK BATTERY CMF DISP FOR DVR (ORTHOPEDIC DISPOSABLE SUPPLIES) ×3 IMPLANT
PACK CRANIOTOMY CUSTOM (CUSTOM PROCEDURE TRAY) ×3 IMPLANT
PATTIES SURGICAL .5 X.5 (GAUZE/BANDAGES/DRESSINGS) IMPLANT
PATTIES SURGICAL .5 X3 (DISPOSABLE) IMPLANT
PATTIES SURGICAL 1X1 (DISPOSABLE) IMPLANT
PLATE UNIV CMF 16 2H (Plate) ×6 IMPLANT
SCREW UNIII AXS SD 1.5X4 (Screw) ×30 IMPLANT
SET CYSTO W/LG BORE CLAMP LF (SET/KITS/TRAYS/PACK) ×3 IMPLANT
SPONGE NEURO XRAY DETECT 1X3 (DISPOSABLE) IMPLANT
SPONGE SURGIFOAM ABS GEL 100 (HEMOSTASIS) ×3 IMPLANT
SPONGE SURGIFOAM ABS GEL SZ50 (HEMOSTASIS) ×3 IMPLANT
STAPLER VISISTAT 35W (STAPLE) ×3 IMPLANT
STOCKINETTE 6  STRL (DRAPES)
STOCKINETTE 6 STRL (DRAPES) IMPLANT
SUT ETHILON 3 0 FSL (SUTURE) IMPLANT
SUT ETHILON 3 0 PS 1 (SUTURE) IMPLANT
SUT NURALON 4 0 TR CR/8 (SUTURE) ×6 IMPLANT
SUT STEEL 0 (SUTURE)
SUT STEEL 0 18XMFL TIE 17 (SUTURE) IMPLANT
SUT VIC AB 0 CT1 18XCR BRD8 (SUTURE) ×1 IMPLANT
SUT VIC AB 0 CT1 8-18 (SUTURE) ×3
SUT VIC AB 3-0 SH 8-18 (SUTURE) ×6 IMPLANT
TAPE CLOTH 1X10 TAN NS (GAUZE/BANDAGES/DRESSINGS) IMPLANT
TOWEL GREEN STERILE (TOWEL DISPOSABLE) ×3 IMPLANT
TOWEL GREEN STERILE FF (TOWEL DISPOSABLE) ×3 IMPLANT
TRAY FOLEY MTR SLVR 16FR STAT (SET/KITS/TRAYS/PACK) ×3 IMPLANT
TUBE CONNECTING 12'X1/4 (SUCTIONS) ×1
TUBE CONNECTING 12X1/4 (SUCTIONS) ×2 IMPLANT
UNDERPAD 30X36 HEAVY ABSORB (UNDERPADS AND DIAPERS) ×3 IMPLANT
WATER STERILE IRR 1000ML POUR (IV SOLUTION) ×3 IMPLANT

## 2020-08-23 NOTE — Anesthesia Preprocedure Evaluation (Addendum)
Anesthesia Evaluation  Patient identified by MRN, date of birth, ID band Patient unresponsive  Preop documentation limited or incomplete due to emergent nature of procedure.  Airway Mallampati: Intubated       Dental  (+) Dental Advisory Given   Pulmonary Current Smoker and Patient abstained from smoking.,    breath sounds clear to auscultation       Cardiovascular hypertension, Pt. on medications + Peripheral Vascular Disease  + Valvular Problems/Murmurs  Rhythm:Regular Rate:Normal     Neuro/Psych Right SDH  Neuromuscular disease    GI/Hepatic Neg liver ROS, GERD  ,  Endo/Other  negative endocrine ROS  Renal/GU negative Renal ROS     Musculoskeletal  (+) Arthritis , Fibromyalgia -  Abdominal   Peds  Hematology  (+) anemia ,   Anesthesia Other Findings   Reproductive/Obstetrics                            Lab Results  Component Value Date   WBC 7.2 12/03/2019   HGB 13.0 12/03/2019   HCT 40.8 12/03/2019   MCV 93.8 12/03/2019   PLT 223 12/03/2019   Lab Results  Component Value Date   CREATININE 1.02 (H) 12/03/2019   BUN 23 12/03/2019   NA 140 12/03/2019   K 3.9 12/03/2019   CL 107 12/03/2019   CO2 24 12/03/2019    Anesthesia Physical Anesthesia Plan  ASA: IV and emergent  Anesthesia Plan: General   Post-op Pain Management:    Induction: Intravenous and Rapid sequence  PONV Risk Score and Plan: 2 and Dexamethasone, Ondansetron and Treatment may vary due to age or medical condition  Airway Management Planned: Oral ETT  Additional Equipment: Arterial line  Intra-op Plan:   Post-operative Plan: Post-operative intubation/ventilation  Informed Consent: I have reviewed the patients History and Physical, chart, labs and discussed the procedure including the risks, benefits and alternatives for the proposed anesthesia with the patient or authorized representative who has  indicated his/her understanding and acceptance.     Dental advisory given  Plan Discussed with: CRNA  Anesthesia Plan Comments:       Anesthesia Quick Evaluation

## 2020-08-23 NOTE — Code Documentation (Signed)
100mg  of Lido in

## 2020-08-23 NOTE — ED Provider Notes (Signed)
[facility] EMERGENCY DEPARTMENT Provider Note   CSN: [id number] Arrival date & time: 08/23/20  6761     History Chief Complaint  Patient presents with  . Fall    Amy Bruce is a 69 y.o. female.  69 year old female with history of PAD, HTN, additional history as listed below, on Plavix, brought in by EMS for evaluation after a fall. Patient states she was leaving her vascular surgeon's office (1 year follow up stent in leg) when she lost her footing and fell, hitting the right side of her head on a pipe. Denies loss of consciousness. Reports pain on the right side of her head, neck pain, low back pain, right thumb pain. No bleeding/wounds. No other injuries, complaints or concerns.         Past Medical History:  Diagnosis Date  . Anemia    in the past  . Anxiety    yrs. ago panic attacks  . Arthritis   . Cancer ([place])    small in ovary - had total hysterestomy  . Complication of anesthesia    pt. reported her father became deaf after having anesthesia  . Fibromyalgia   . GERD (gastroesophageal reflux disease)   . Heart murmur    due to rheumatic fever, PCP no longer hears it  . Hyperlipidemia   . Hypertension   . Myalgia   . Neuropathy   . PAD (peripheral artery disease) ([place])   . PAD (peripheral artery disease) ([place])   . Pre-diabetes   . Pyelonephritis 09/10/2019    "septic"  . Tobacco abuse   . 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 Subdural hematoma ([place]) 08/23/2020  . Acute right-sided thoracic back pain 05/26/2020  . Carpal tunnel syndrome of right wrist 04/14/2020  . S/P cervical spinal fusion 04/14/2020  . Tobacco use disorder 11/30/2019  . Family history of multiple myeloma 10/14/2019  . Hyperglycemia 10/14/2019  . Hyperlipidemia 10/14/2019  . Microscopic hematuria 10/14/2019  . Neuropathic pain 08/27/2019  . Facet arthropathy, cervical 05/19/2019  .  Tobacco abuse 04/08/2019  . Cervical radiculopathy at C5 01/15/2019  . Chronic scapular pain 12/18/2018  . Chronic right shoulder pain 10/17/2018  . Pyelonephritis 09/10/2018  . PAD (peripheral artery disease) ([place])   . Hypertension   . Chronic pain syndrome 10/11/2016  . Right shoulder pain 10/11/2016  . Neck pain, chronic 10/11/2016  . Numbness and tingling in right hand 10/11/2016  . Myalgia 10/11/2016    Past Surgical History:  Procedure Laterality Date  . ABDOMINAL AORTOGRAM W/LOWER EXTREMITY N/A 04/02/2018   Procedure: ABDOMINAL AORTOGRAM W/LOWER EXTREMITY;  Surgeon: Wellington Hampshire, MD;  Location: [facility];  Service: Cardiovascular;  Laterality: N/A;  . ABDOMINAL AORTOGRAM W/LOWER EXTREMITY N/A 09/23/2019   Procedure: ABDOMINAL AORTOGRAM W/LOWER EXTREMITY;  Surgeon: Wellington Hampshire, MD;  Location: [facility];  Service: Cardiovascular;  Laterality: N/A;  . ABDOMINAL HYSTERECTOMY  1999  . CERVICAL SPINE SURGERY  [id number]   C5/6 6/7  . COLONOSCOPY    . ECTOPIC PREGNANCY SURGERY    . Left axillary cyst removal 1989    . MULTIPLE EXTRACTIONS WITH ALVEOLOPLASTY N/A 10/25/2017   Procedure: EXTRACTION NUMBERS FIVE, SEVEN, EIGHT, NINE, TEN, FIFTEEN, EIGHTEEN WITH ALVEOLOPLASTY, IRRIGATION AND DEBRIDEMENT LEFT SUBMANDIBULAR INFECTION;  Surgeon: Diona Browner, DDS;  Location: [facility];  Service: Oral Surgery;  Laterality: N/A;  .  PERIPHERAL VASCULAR ATHERECTOMY Right 09/23/2019   Procedure: PERIPHERAL VASCULAR ATHERECTOMY;  Surgeon: Wellington Hampshire, MD;  Location: [facility];  Service: Cardiovascular;  Laterality: Right;  SFA  . PERIPHERAL VASCULAR BALLOON ANGIOPLASTY Right 09/23/2019   Procedure: PERIPHERAL VASCULAR BALLOON ANGIOPLASTY;  Surgeon: Wellington Hampshire, MD;  Location: [facility];  Service: Cardiovascular;  Laterality: Right;  SFA  . RADIOLOGY WITH ANESTHESIA N/A 11/18/2018   Procedure: MRI OF THE CERVICAL SPINE WITHOUT CONTRAST;  Surgeon:  Radiologist, Medication, MD;  Location: [facility];  Service: Radiology;  Laterality: N/A;  . RADIOLOGY WITH ANESTHESIA N/A 12/03/2019   Procedure: MRI WITH ANESTHESIA CERVICAL WITH AND WITHOUT CONTRAST;  Surgeon: Radiologist, Medication, MD;  Location: [facility];  Service: Radiology;  Laterality: N/A;  . TONSILECTOMY, ADENOIDECTOMY, BILATERAL MYRINGOTOMY AND TUBES  1981   put not aware of tubes being put in ears  . TUBAL LIGATION  1981     OB History   No obstetric history on file.     Family History  Problem Relation Age of Onset  . Ovarian cancer Mother     Social History   Tobacco Use  . Smoking status: Current Every Day Smoker    Packs/day: 1.00    Years: 33.00    Pack years: 33.00    Types: Cigarettes  . Smokeless tobacco: Never Used  . Tobacco comment: 2 ciggs a day  Vaping Use  . Vaping Use: Never used  Substance Use Topics  . Alcohol use: Yes    Alcohol/week: 2.0 standard drinks    Types: 2 Shots of liquor per week    Comment: socially  . Drug use: No    Home Medications Prior to Admission medications   Medication Sig Start Date End Date Taking? Authorizing Provider  albuterol (VENTOLIN HFA) 108 (90 Base) MCG/ACT inhaler INHALE 1 TO 2 PUFFS INTO THE LUNGS EVERY 4 HOURS AS NEEDED FOR WHEEZING 06/14/20   Magdalen Spatz, NP  amitriptyline (ELAVIL) 50 MG tablet TAKE 1 TABLET(50 MG) BY MOUTH AT BEDTIME 07/12/20   Jamse Arn, MD  aspirin EC 81 MG tablet Take 81 mg by mouth daily.    [provider]  atorvastatin (LIPITOR) 80 MG tablet Take 80 mg by mouth daily. 04/06/20   [provider]  calcium carbonate (TUMS - DOSED IN MG ELEMENTAL CALCIUM) 500 MG chewable tablet Chew 2 tablets by mouth 2 (two) times daily as needed for indigestion or heartburn.     [provider]  Camphor-Eucalyptus-Menthol (VICKS VAPORUB EX) Apply 1 application topically daily as needed (congestion).    [provider]  clopidogrel (PLAVIX) 75 MG tablet Take 1  tablet (75 mg total) by mouth daily. 08/23/20 08/23/21  Wellington Hampshire, MD  cyclobenzaprine (FLEXERIL) 10 MG tablet Take 1 tablet (10 mg total) by mouth 3 (three) times daily as needed for muscle spasms. Patient taking differently: Take 10 mg by mouth 3 (three) times daily. 04/08/19   Jamse Arn, MD  diazepam (VALIUM) 5 MG tablet Take 5 mg by mouth as directed. 05/18/20   [provider]  fexofenadine (ALLEGRA) 180 MG tablet Take 180 mg by mouth daily.    [provider]  lidocaine (LIDODERM) 5 % UNWRAP AND APPLY 1 PATCH TO SKIN DAILY. REMOVE AND DISCARD PATCH WITHIN 12 HOURS OR AS DIRECTED BY MD. Patient taking differently: Place 1 patch onto the skin daily as needed (pain.). UNWRAP AND APPLY 1 PATCH TO SKIN DAILY. REMOVE AND  DISCARD PATCH WITHIN 12 HOURS OR AS DIRECTED BY MD. 01/23/18   Jamse Arn, MD  nicotine (NICODERM CQ - DOSED IN MG/24 HOURS) 21 mg/24hr patch Place 21 mg onto the skin daily as needed (smoking cessation).    [provider]  nicotine polacrilex (COMMIT) 2 MG lozenge Take 2 mg by mouth as needed for smoking cessation (Max 20 lozenges per day).     [provider]  Omega-3 Fatty Acids (FISH OIL) 1000 MG CAPS Take 1 capsule by mouth daily.    [provider]  oxyCODONE-acetaminophen (PERCOCET) 7.5-325 MG tablet Take 1 tablet by mouth every 4 (four) hours as needed for severe pain.    [provider]  pantoprazole (PROTONIX) 40 MG tablet SMARTSIG:1 Tablet(s) By Mouth Every Evening 04/30/20   [provider]  pregabalin (LYRICA) 200 MG capsule TAKE 1 CAPSULE BY MOUTH EVERY MORNING, 1 CAPSULE AT NOON, AND 1 CAPSULE AT BEDTIME 05/23/20   Patel, Domenick Bookbinder, MD  tiZANidine (ZANAFLEX) 4 MG tablet TAKE 1/2 TABLET(2 MG) BY MOUTH THREE TIMES DAILY 04/08/20   Jamse Arn, MD  vitamin B-12 (CYANOCOBALAMIN) 1000 MCG tablet Take 1,000 mcg by mouth daily.    [provider]    Allergies    Carisoprodol and  Duloxetine hcl  Review of Systems   Review of Systems  Constitutional: Negative for fever.  Eyes: Negative for visual disturbance.  Respiratory: Negative for shortness of breath.   Cardiovascular: Negative for chest pain.  Gastrointestinal: Negative for nausea and vomiting.  Musculoskeletal: Positive for arthralgias, back pain and neck pain.  Skin: Negative for wound.  Allergic/Immunologic: Negative for immunocompromised state.  Neurological: Positive for headaches. Negative for dizziness, speech difficulty, weakness and light-headedness.  Psychiatric/Behavioral: Negative for confusion.  All other systems reviewed and are negative.   Physical Exam Updated Vital Signs BP (!) 158/79   Pulse 78   Temp 98.7 F (37.1 C) (Oral)   Resp 15   SpO2 100%   Physical Exam Vitals and nursing note reviewed.  Constitutional:      General: She is not in acute distress.    Appearance: She is well-developed and well-nourished. She is not diaphoretic.    HENT:     Head: Normocephalic.     Nose: Nose normal.     Mouth/Throat:     Mouth: Mucous membranes are moist.  Eyes:     Extraocular Movements: Extraocular movements intact.     Pupils: Pupils are equal, round, and reactive to light.  Neck:     Comments: c-collar in place Cardiovascular:     Rate and Rhythm: Normal rate and regular rhythm.     Pulses: Normal pulses.     Heart sounds: Normal heart sounds.  Pulmonary:     Effort: Pulmonary effort is normal.     Breath sounds: Normal breath sounds.  Abdominal:     Palpations: Abdomen is soft.     Tenderness: There is no abdominal tenderness.  Musculoskeletal:        General: Tenderness present. No swelling or deformity.     Thoracic back: No tenderness or bony tenderness.     Lumbar back: Tenderness present. No bony tenderness.     Comments: Mild tenderness right thumb with bruising, normal ROM right thumb. Normal ROM hips and ankles without pain. Mild tenderness across low back,  no step offs.  C-collar in place  Skin:    General: Skin is warm and dry.     Findings: No  erythema or rash.  Neurological:     Mental Status: She is alert and oriented to person, place, and time.     GCS: GCS eye subscore is 4. GCS verbal subscore is 5. GCS motor subscore is 6.     Sensory: No sensory deficit.     Motor: No weakness.  Psychiatric:        Mood and Affect: Mood and affect normal.        Behavior: Behavior normal.     ED Results / Procedures / Treatments   Labs (all labs ordered are listed, but only abnormal results are displayed) Labs Reviewed  CBC WITH DIFFERENTIAL/PLATELET - Abnormal; Notable for the following components:      Result Value   Hemoglobin 11.6 (*)    HCT 34.6 (*)    RDW 15.9 (*)    All other components within normal limits  COMPREHENSIVE METABOLIC PANEL - Abnormal; Notable for the following components:   Potassium 3.2 (*)    Glucose, Bld 169 (*)    Total Protein 6.1 (*)    Albumin 3.2 (*)    All other components within normal limits  RESP PANEL BY RT-PCR (FLU A&B, COVID) [id number]  AMMONIA  PROTIME-INR  URINALYSIS, ROUTINE W REFLEX MICROSCOPIC  RAPID URINE DRUG SCREEN, HOSP PERFORMED  TSH  BLOOD GAS, ARTERIAL  CBG MONITORING, ED  TYPE AND SCREEN  PREPARE PLATELET PHERESIS  ABO/RH    EKG EKG Interpretation  Date/Time:  Tuesday August 23 2020 11:22:14 EST Ventricular Rate:  79 PR Interval:    QRS Duration: 99 QT Interval:  433 QTC Calculation: 497 R Axis:   65 Text Interpretation: Sinus rhythm Borderline prolonged QT interval Nonspecific T wave abnormality Confirmed by Ezequiel Essex [phone]) on 08/23/2020 11:45:22 AM   Radiology DG Lumbar Spine Complete  Result Date: 08/23/2020 CLINICAL DATA:  Fall, pain EXAM: LUMBAR SPINE - COMPLETE 4+ VIEW COMPARISON:  None. FINDINGS: Hypoplastic ribs at T12. 5 lumbar type vertebral bodies. Vertebral body heights and alignment are maintained. There is no acute fracture identified. No  evidence of pars break. Overall mild disc space narrowing, greatest at L5-S1. Lower lumbar facet hypertrophy. IMPRESSION: No acute fracture.  Mild degenerative changes. Electronically Signed   By: Macy Mis M.D.   On: 08/23/2020 10:29   CT Head Wo Contrast  Result Date: 08/23/2020 CLINICAL DATA:  Fall, on anticoagulation EXAM: CT HEAD WITHOUT CONTRAST TECHNIQUE: Contiguous axial images were obtained from the base of the skull through the vertex without intravenous contrast. COMPARISON:  None. FINDINGS: Brain: There is mixed density but primarily hyperdense subdural hematoma along the right cerebral convexity measuring up to 1.5 cm in thickness. Additional thin subdural hemorrhage is present along the right aspect of the falx. There is mass effect on the underlying brain parenchyma with subfalcine herniation. Leftward midline shift measures 12 mm. No definite ventricle trapping at this time. Gray-white differentiation is preserved. Vascular: There is atherosclerotic calcification at the skull base. Skull: Calvarium is unremarkable. Sinuses/Orbits: No acute finding. Other: Right scalp hematoma. IMPRESSION: Acute right cerebral convexity greater than falcine subdural hematoma. 12 mm leftward midline shift. No definite ventricle trapping at this time. These results were called by telephone at the time of interpretation on 08/23/2020 at 11:11 am to provider Dr. Wyvonnia Dusky, who verbally acknowledged these results. Electronically Signed   By: Macy Mis M.D.   On: 08/23/2020 11:15   CT Cervical Spine Wo Contrast  Result Date: 08/23/2020 CLINICAL DATA:  Neck trauma, fall with trauma  to RIGHT side of the head. On blood thinners. EXAM: CT CERVICAL SPINE WITHOUT CONTRAST TECHNIQUE: Multidetector CT imaging of the cervical spine was performed without intravenous contrast. Multiplanar CT image reconstructions were also generated. COMPARISON:  MRI of the cervical spine from March of 2021 FINDINGS: Alignment:  Straightening of upper cervical lordotic curvature following fusion at C3 through C5 with anterior cervical discectomy and fusion as seen on the prior MRI. Mild reversal of normal cervical lordotic curvature is also similar with subjacent bony ankylosis at C5-6 and C6-7 about the disc spaces. Skull base and vertebrae: No acute fracture. No primary bone lesion or focal pathologic process. Soft tissues and spinal canal: No prevertebral fluid or swelling. No visible canal hematoma. Disc levels: Spinal fusion with degenerative changes as outlined above. Upper chest: Negative. Other: None IMPRESSION: 1. No acute fracture or subluxation of the cervical spine. 2. Spinal fusion at C3 through C5 with subjacent bony ankylosis at C5-6 and C6-7 about the disc spaces. Unchanged compared to previous MRI. Electronically Signed   By: Zetta Bills M.D.   On: 08/23/2020 11:21   CT ABDOMEN PELVIS W CONTRAST  Result Date: 08/23/2020 CLINICAL DATA:  Abdominal trauma.  Fall with abdominal pain. EXAM: CT ABDOMEN AND PELVIS WITH CONTRAST TECHNIQUE: Multidetector CT imaging of the abdomen and pelvis was performed using the standard protocol following bolus administration of intravenous contrast. CONTRAST:  129m OMNIPAQUE IOHEXOL 300 MG/ML  SOLN COMPARISON:  09/11/2018 FINDINGS: Lower chest: Mild dependent atelectasis in the lung bases. No pleural effusion. Hepatobiliary: No focal liver abnormality is seen. No gallstones, gallbladder wall thickening, or biliary dilatation. Pancreas: Unremarkable. Spleen: Unremarkable. Adrenals/Urinary Tract: Unremarkable adrenal glands. Mild multifocal left renal scarring. Unchanged 1.7 cm left upper pole renal cyst. No renal calculi or hydronephrosis. Moderately distended bladder. Stomach/Bowel: There is a small sliding hiatal hernia. The stomach is moderately distended by ingested material. There is a moderate amount of stool in the left colon and rectum. There is no evidence of bowel obstruction  or inflammation. Mild right-sided colonic diverticulosis is noted without evidence of diverticulitis. The appendix is unremarkable. Vascular/Lymphatic: Abdominal aortic atherosclerosis without aneurysm. No enlarged lymph nodes. Reproductive: Status post hysterectomy. No adnexal masses. Other: No ascites or pneumoperitoneum. Musculoskeletal: No acute osseous abnormality or suspicious osseous lesion. Severe facet arthrosis at L4-5. IMPRESSION: 1. No evidence of acute traumatic injury in the abdomen or pelvis. 2. Moderate colonic stool burden. 3. Aortic Atherosclerosis (ICD10-I70.0). Electronically Signed   By: ALogan BoresM.D.   On: 08/23/2020 11:28   DG Finger Thumb Right  Result Date: 08/23/2020 CLINICAL DATA:  Fall.  Pain. EXAM: RIGHT THUMB 2+V COMPARISON:  None. FINDINGS: Mild osteoarthritis in the D IP joint. No erosion. Negative for fracture. IMPRESSION: Negative for fracture. Electronically Signed   By: CFranchot GalloM.D.   On: 08/23/2020 10:27    Procedures .Critical Care Performed by: MTacy Learn PA-C Authorized by: MTacy Learn PA-C   Critical care provider statement:    Critical care time (minutes):  45   Critical care was time spent personally by me on the following activities:  Discussions with consultants, evaluation of patient's response to treatment, examination of patient, ordering and performing treatments and interventions, ordering and review of laboratory studies, ordering and review of radiographic studies, pulse oximetry, re-evaluation of patient's condition, obtaining history from patient or surrogate and review of old charts  Procedure Name: Intubation Date/Time: 08/23/2020 12:26 PM Performed by: MTacy Learn PA-C Pre-anesthesia Checklist: Patient identified, Patient  being monitored, Emergency Drugs available, Timeout performed and Suction available Oxygen Delivery Method: Non-rebreather mask Preoxygenation: Pre-oxygenation with 100% oxygen Induction Type:  Rapid sequence Ventilation: Mask ventilation without difficulty Laryngoscope Size: Glidescope Number of attempts: 1 Airway Equipment and Method: Video-laryngoscopy and Stylet Placement Confirmation: ETT inserted through vocal cords under direct vision,  CO2 detector and Breath sounds checked- equal and bilateral Secured at: 25 cm Tube secured with: ETT holder Dental Injury: Teeth and Oropharynx as per pre-operative assessment  Difficulty Due To: Difficult Airway-  due to neck instability      (including critical care time)  Medications Ordered in ED Medications  acetaminophen (TYLENOL) tablet 650 mg (650 mg Oral Not Given 08/23/20 1155)  labetalol (NORMODYNE) injection 10 mg (10 mg Intravenous Given 08/23/20 1159)  0.9 %  sodium chloride infusion (Manually program via Guardrails IV Fluids) (has no administration in time range)  levETIRAcetam (KEPPRA) IVPB 1000 mg/100 mL premix (1,000 mg Intravenous New Bag/Given 08/23/20 1204)  rocuronium bromide 100 MG/10ML SOSY (has no administration in time range)  lidocaine (cardiac) 100 mg/84m (XYLOCAINE) injection 2% ( Intravenous Canceled Entry 08/23/20 1145)  etomidate (AMIDATE) injection ( Intravenous Canceled Entry 08/23/20 1145)  succinylcholine (ANECTINE) injection ( Intravenous Canceled Entry 08/23/20 1145)  propofol (DIPRIVAN) 1000 MG/100ML infusion (5 mcg/kg/min  77.8 kg Intravenous New Bag/Given 08/23/20 1155)  0.9 %  sodium chloride infusion (Manually program via Guardrails IV Fluids) (has no administration in time range)  midazolam (VERSED) 2 MG/2ML injection (has no administration in time range)  0.9 % irrigation (POUR BTL) (1,000 mLs Irrigation Given 08/23/20 1216)  iohexol (OMNIPAQUE) 300 MG/ML solution 100 mL (100 mLs Intravenous Contrast Given 08/23/20 1116)    ED Course  I have reviewed the triage vital signs and the nursing notes.  Pertinent labs & imaging results that were available during my care of the patient were  reviewed by me and considered in my medical decision making (see chart for details).  Clinical Course as of 08/23/20 1250  Tue Dec 14, 23967 [id number] old female brought in by EMS after a mechanical fall at the doctor's office today, hit the right side of her head on a pole. On exam, patient is alert, oriented, reports right sided headache with pain in her neck, low back and right thumb. CT and x-ray ordered. Called to bedside at 10:50AM for change in mental status. Patient is rolling onto her right side, seems confused- reports she is in the hospital because she fell at home today and complains of abdominal pain. Dr. RWyvonnia Dusky ER attending at bedside as well.  Patient follows commands, pupils equal, moves extremities equally. Call to CT, patient will go to CT now. [LM]  [id number] from CT, subdural hematoma. Neurosurgery, Dr. NKathyrn Sheriff is in the department and will see the patient.  [LM]  11914Decline in mental status requiring mechanical ventilation.  Patient was intubated with the assistance and supervision of Dr. RJosefa Half  [LM]    Clinical Course User Index [LM] MRoque Lias  MDM Rules/Calculators/A&P                          Final Clinical Impression(s) / ED Diagnoses Final diagnoses:  [place]  [place], initial encounter    Rx / DC Orders ED Discharge Orders    None       MRoque Lias12/14/21 1250    REzequiel Essex MD 08/23/20 1515

## 2020-08-23 NOTE — H&P (Signed)
Chief Complaint   Chief Complaint  Patient presents with  . Fall    HPI   Consult requested by: Dr Wyvonnia Dusky Reason for consult: SDH  HPI: Amy Bruce is a 69 y.o. female with multiple medical comorbidities including PAD on Plavix who presented to the ED after losing balance while outside doctor's office. Struck head on metal pipe, no LOC. Arrived A&Ox4 but has become more lethargic since being in ED. She underwent stat CT head which revealed a large right SDH with associated MLS. A NSY consultation was requested. I was already in the ED and immediately evaluated the patient. She is becoming more drowsy and unable to provide reliable history. No sedating meds on board.  Patient Active Problem List   Diagnosis Date Noted  . Acute right-sided thoracic back pain 05/26/2020  . Carpal tunnel syndrome of right wrist 04/14/2020  . S/P cervical spinal fusion 04/14/2020  . Tobacco use disorder 11/30/2019  . Family history of multiple myeloma 10/14/2019  . Hyperglycemia 10/14/2019  . Hyperlipidemia 10/14/2019  . Microscopic hematuria 10/14/2019  . Neuropathic pain 08/27/2019  . Facet arthropathy, cervical 05/19/2019  . Tobacco abuse 04/08/2019  . Cervical radiculopathy at C5 01/15/2019  . Chronic scapular pain 12/18/2018  . Chronic right shoulder pain 10/17/2018  . Pyelonephritis 09/10/2018  . PAD (peripheral artery disease) ([place])   . Hypertension   . Chronic pain syndrome 10/11/2016  . Right shoulder pain 10/11/2016  . Neck pain, chronic 10/11/2016  . Numbness and tingling in right hand 10/11/2016  . Myalgia 10/11/2016    PMH: Past Medical History:  Diagnosis Date  . Anemia    in the past  . Anxiety    yrs. ago panic attacks  . Arthritis   . Cancer ([place])    small in ovary - had total hysterestomy  . Complication of anesthesia    pt. reported her father became deaf after having anesthesia  . Fibromyalgia   . GERD (gastroesophageal reflux disease)   . Heart murmur     due to rheumatic fever, PCP no longer hears it  . Hyperlipidemia   . Hypertension   . Myalgia   . Neuropathy   . PAD (peripheral artery disease) ([place])   . PAD (peripheral artery disease) ([place])   . Pre-diabetes   . Pyelonephritis 09/10/2019    "septic"  . Tobacco abuse   . 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 ABDOMINAL AORTOGRAM W/LOWER EXTREMITY N/A 04/02/2018   Procedure: ABDOMINAL AORTOGRAM W/LOWER EXTREMITY;  Surgeon: Wellington Hampshire, MD;  Location: [facility];  Service: Cardiovascular;  Laterality: N/A;  . ABDOMINAL AORTOGRAM W/LOWER EXTREMITY N/A 09/23/2019   Procedure: ABDOMINAL AORTOGRAM W/LOWER EXTREMITY;  Surgeon: Wellington Hampshire, MD;  Location: [facility];  Service: Cardiovascular;  Laterality: N/A;  . ABDOMINAL HYSTERECTOMY  1999  . CERVICAL SPINE SURGERY  [id number]   C5/6 6/7  . COLONOSCOPY    . ECTOPIC PREGNANCY SURGERY    . Left axillary cyst removal 1989    . MULTIPLE EXTRACTIONS WITH ALVEOLOPLASTY N/A 10/25/2017   Procedure: EXTRACTION NUMBERS FIVE, SEVEN, EIGHT, NINE, TEN, FIFTEEN, EIGHTEEN WITH ALVEOLOPLASTY, IRRIGATION AND DEBRIDEMENT LEFT SUBMANDIBULAR INFECTION;  Surgeon: Diona Browner, DDS;  Location: [facility];  Service: Oral Surgery;  Laterality: N/A;  . PERIPHERAL VASCULAR ATHERECTOMY Right 09/23/2019   Procedure: PERIPHERAL VASCULAR ATHERECTOMY;  Surgeon: Wellington Hampshire, MD;  Location:  [facility];  Service: Cardiovascular;  Laterality: Right;  SFA  . PERIPHERAL VASCULAR BALLOON ANGIOPLASTY Right 09/23/2019   Procedure: PERIPHERAL VASCULAR BALLOON ANGIOPLASTY;  Surgeon: Wellington Hampshire, MD;  Location: [facility];  Service: Cardiovascular;  Laterality: Right;  SFA  . RADIOLOGY WITH ANESTHESIA N/A 11/18/2018   Procedure: MRI OF THE CERVICAL SPINE WITHOUT CONTRAST;  Surgeon: Radiologist, Medication, MD;  Location: [facility];  Service: Radiology;  Laterality:  N/A;  . RADIOLOGY WITH ANESTHESIA N/A 12/03/2019   Procedure: MRI WITH ANESTHESIA CERVICAL WITH AND WITHOUT CONTRAST;  Surgeon: Radiologist, Medication, MD;  Location: [facility];  Service: Radiology;  Laterality: N/A;  . TONSILECTOMY, ADENOIDECTOMY, BILATERAL MYRINGOTOMY AND TUBES  1981   put not aware of tubes being put in ears  . TUBAL LIGATION  1981    (Not in a hospital admission)   SH: Social History   Tobacco Use  . Smoking status: Current Every Day Smoker    Packs/day: 1.00    Years: 33.00    Pack years: 33.00    Types: Cigarettes  . Smokeless tobacco: Never Used  . Tobacco comment: 2 ciggs a day  Vaping Use  . Vaping Use: Never used  Substance Use Topics  . Alcohol use: Yes    Alcohol/week: 2.0 standard drinks    Types: 2 Shots of liquor per week    Comment: socially  . Drug use: No    MEDS: Prior to Admission medications   Medication Sig Start Date End Date Taking? Authorizing Provider  albuterol (VENTOLIN HFA) 108 (90 Base) MCG/ACT inhaler INHALE 1 TO 2 PUFFS INTO THE LUNGS EVERY 4 HOURS AS NEEDED FOR WHEEZING 06/14/20   Magdalen Spatz, NP  amitriptyline (ELAVIL) 50 MG tablet TAKE 1 TABLET(50 MG) BY MOUTH AT BEDTIME 07/12/20   Jamse Arn, MD  aspirin EC 81 MG tablet Take 81 mg by mouth daily.    [provider]  atorvastatin (LIPITOR) 80 MG tablet Take 80 mg by mouth daily. 04/06/20   [provider]  calcium carbonate (TUMS - DOSED IN MG ELEMENTAL CALCIUM) 500 MG chewable tablet Chew 2 tablets by mouth 2 (two) times daily as needed for indigestion or heartburn.     [provider]  Camphor-Eucalyptus-Menthol (VICKS VAPORUB EX) Apply 1 application topically daily as needed (congestion).    [provider]  clopidogrel (PLAVIX) 75 MG tablet Take 1 tablet (75 mg total) by mouth daily. 08/23/20 08/23/21  Wellington Hampshire, MD  cyclobenzaprine (FLEXERIL) 10 MG tablet Take 1 tablet (10 mg total) by mouth 3 (three) times daily as needed  for muscle spasms. Patient taking differently: Take 10 mg by mouth 3 (three) times daily. 04/08/19   Jamse Arn, MD  diazepam (VALIUM) 5 MG tablet Take 5 mg by mouth as directed. 05/18/20   [provider]  fexofenadine (ALLEGRA) 180 MG tablet Take 180 mg by mouth daily.    [provider]  lidocaine (LIDODERM) 5 % UNWRAP AND APPLY 1 PATCH TO SKIN DAILY. REMOVE AND DISCARD PATCH WITHIN 12 HOURS OR AS DIRECTED BY MD. Patient taking differently: Place 1 patch onto the skin daily as needed (pain.). UNWRAP AND APPLY 1 PATCH TO SKIN DAILY. REMOVE AND DISCARD PATCH WITHIN 12 HOURS OR AS DIRECTED BY MD. 01/23/18   Jamse Arn, MD  nicotine (NICODERM CQ - DOSED IN MG/24 HOURS) 21 mg/24hr patch Place 21 mg onto the skin daily as needed (smoking cessation).  [provider]  nicotine polacrilex (COMMIT) 2 MG lozenge Take 2 mg by mouth as needed for smoking cessation (Max 20 lozenges per day).     [provider]  Omega-3 Fatty Acids (FISH OIL) 1000 MG CAPS Take 1 capsule by mouth daily.    [provider]  oxyCODONE-acetaminophen (PERCOCET) 7.5-325 MG tablet Take 1 tablet by mouth every 4 (four) hours as needed for severe pain.    [provider]  pantoprazole (PROTONIX) 40 MG tablet SMARTSIG:1 Tablet(s) By Mouth Every Evening 04/30/20   [provider]  pregabalin (LYRICA) 200 MG capsule TAKE 1 CAPSULE BY MOUTH EVERY MORNING, 1 CAPSULE AT NOON, AND 1 CAPSULE AT BEDTIME 05/23/20   Patel, Domenick Bookbinder, MD  tiZANidine (ZANAFLEX) 4 MG tablet TAKE 1/2 TABLET(2 MG) BY MOUTH THREE TIMES DAILY 04/08/20   Jamse Arn, MD  vitamin B-12 (CYANOCOBALAMIN) 1000 MCG tablet Take 1,000 mcg by mouth daily.    [provider]    ALLERGY: Allergies  Allergen Reactions  . Carisoprodol Swelling  . Duloxetine Hcl Swelling and Hypertension    Social History   Tobacco Use  . Smoking status: Current Every Day Smoker    Packs/day: 1.00     Years: 33.00    Pack years: 33.00    Types: Cigarettes  . Smokeless tobacco: Never Used  . Tobacco comment: 2 ciggs a day  Substance Use Topics  . Alcohol use: Yes    Alcohol/week: 2.0 standard drinks    Types: 2 Shots of liquor per week    Comment: socially     Family History  Problem Relation Age of Onset  . Ovarian cancer Mother      ROS   ROS unable to obtain, AMS  Exam   Vitals:   08/23/20 0934 08/23/20 0937  BP: (!) 142/70   Pulse: 100   Resp: 18   Temp: 98.7 F (37.1 C)   SpO2: 99% 98%   elderly female, drowsy, requires sternal rub to be awakened Pupils 4mm, reactive bilaterally CN grossly intact Follows commands with all extremities, seemingly equal + drift  Results - Imaging/Labs   No results found for this or any previous visit (from the past 48 hour(s)).  DG Lumbar Spine Complete  Result Date: 08/23/2020 CLINICAL DATA:  Fall, pain EXAM: LUMBAR SPINE - COMPLETE 4+ VIEW COMPARISON:  None. FINDINGS: Hypoplastic ribs at T12. 5 lumbar type vertebral bodies. Vertebral body heights and alignment are maintained. There is no acute fracture identified. No evidence of pars break. Overall mild disc space narrowing, greatest at L5-S1. Lower lumbar facet hypertrophy. IMPRESSION: No acute fracture.  Mild degenerative changes. Electronically Signed   By: Macy Mis M.D.   On: 08/23/2020 10:29   CT Head Wo Contrast  Result Date: 08/23/2020 CLINICAL DATA:  Fall, on anticoagulation EXAM: CT HEAD WITHOUT CONTRAST TECHNIQUE: Contiguous axial images were obtained from the base of the skull through the vertex without intravenous contrast. COMPARISON:  None. FINDINGS: Brain: There is mixed density but primarily hyperdense subdural hematoma along the right cerebral convexity measuring up to 1.5 cm in thickness. Additional thin subdural hemorrhage is present along the right aspect of the falx. There is mass effect on the underlying brain parenchyma with subfalcine herniation.  Leftward midline shift measures 12 mm. No definite ventricle trapping at this time. Gray-white differentiation is preserved. Vascular: There is atherosclerotic calcification at the skull base. Skull: Calvarium is unremarkable. Sinuses/Orbits: No acute finding. Other: Right scalp hematoma. IMPRESSION: Acute right  cerebral convexity greater than falcine subdural hematoma. 12 mm leftward midline shift. No definite ventricle trapping at this time. These results were called by telephone at the time of interpretation on 08/23/2020 at 11:11 am to provider Dr. Wyvonnia Dusky, who verbally acknowledged these results. Electronically Signed   By: Macy Mis M.D.   On: 08/23/2020 11:15   DG Finger Thumb Right  Result Date: 08/23/2020 CLINICAL DATA:  Fall.  Pain. EXAM: RIGHT THUMB 2+V COMPARISON:  None. FINDINGS: Mild osteoarthritis in the D IP joint. No erosion. Negative for fracture. IMPRESSION: Negative for fracture. Electronically Signed   By: Franchot Gallo M.D.   On: 08/23/2020 10:27   Impression/Plan   69 y.o. female with fall earlier today found to have a large right SDH with approximately 70m MLS. She is drowsy, but awakens to pain with grossly non-focal exam. She is on Plavix daily due to PAD with stent. She will need to undergo emergent craniotomy for evacuation of the SDH. Given progressive drowsiness the decision was made to intubate while in the ED. I have tried to call patient's daughter multiple times without success. Will proceed emergently under implied consent. - 4N post op - Will consult CCM for vent management - Load with 1g Keppra - Transfuse FFP, hold Plavix  VFerne Reus PA-C [facility]

## 2020-08-23 NOTE — ED Triage Notes (Signed)
Pt BIB [facility] from her doctors office. Pt lost her balance and fell outside the office. Pt did hit her head on a metal pipe but states she did not lose consciousness. Pt is currently A&Ox4. Pt is c/o of neck, lower back and right hip pain. Pt states she is on Plavix.

## 2020-08-23 NOTE — Progress Notes (Signed)
Cardiology Office Note   Date:  08/23/2020   ID:  Amy Bruce, DOB 1951-01-08, MRN [id number]  PCP:  Amy Pepper, MD  Cardiologist:   Amy Sacramento, MD   No chief complaint on file.     History of Present Illness: Amy Bruce is a 69 y.o. female who is here today for a follow-up visit regarding peripheral arterial disease.  She has known history of peripheral arterial disease, tobacco use, essential hypertension and hyperlipidemia.  Previous CT imaging showed evidence of aortic and coronary calcifications.  She has prolonged history of plantar fasciitis. She has known history of peripheral arterial disease with moderately reduced ABI on the right at 0.54 and mild on the left at 0.81.  Angiography in July 2019 showed diffuse right SFA disease with medium length occlusion in the mid to distal segment.  On the left there was moderate ostial left SFA stenosis with two-vessel runoff below the knee.  She was referred to vascular surgery to consider right femoral-popliteal bypass but medical therapy was pursued after.   Lexiscan Myoview in September 2019 showed no evidence of ischemia with normal ejection fraction. She had back surgery done last year.  She had worsening claudication early this year.  Angiography was done in January which showed no significant change from last year.  I performed successful orbital atherectomy to the proximal and mid right SFA followed by drug-coated balloon angioplasty.  The distal SFA was treated with drug-eluting stent given that the reentry was subintimal.  Postprocedure ABI improved to normal with mildly decreased toe pressure. She cut down on tobacco use after the procedure but could not quit completely.  Most recent vascular studies in May showed an ABI of 0.83 bilaterally.  Right lower extremity arterial duplex showed moderate stenosis affecting the common femoral and mid SFA.The stent in the SFA was patent with no significant restenosis.  On  the left, there was moderate common femoral artery stenosis.  She has chronic back pain.  No calf claudication.  No chest pain or shortness of breath.  She takes her medications regularly.  Past Medical History:  Diagnosis Date  . Anemia    in the past  . Anxiety    yrs. ago panic attacks  . Arthritis   . 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 Complication of anesthesia    pt. reported her father became deaf after having anesthesia  . Fibromyalgia   . GERD (gastroesophageal reflux disease)   . Heart murmur    due to rheumatic fever, PCP no longer hears it  . Hyperlipidemia   . Hypertension   . Myalgia   . Neuropathy   . PAD (peripheral artery disease) ([place])   . PAD (peripheral artery disease) ([place])   . Pre-diabetes   . Pyelonephritis 09/10/2019    "septic"  . Tobacco abuse   . 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 ABDOMINAL AORTOGRAM W/LOWER EXTREMITY N/A 04/02/2018   Procedure: ABDOMINAL AORTOGRAM W/LOWER EXTREMITY;  Surgeon: Amy Hampshire, MD;  Location: [facility];  Service: Cardiovascular;  Laterality: N/A;  . ABDOMINAL AORTOGRAM W/LOWER EXTREMITY N/A 09/23/2019   Procedure: ABDOMINAL AORTOGRAM W/LOWER EXTREMITY;  Surgeon: Amy Hampshire, MD;  Location: [facility];  Service: Cardiovascular;  Laterality: N/A;  . ABDOMINAL HYSTERECTOMY  1999  . CERVICAL SPINE SURGERY  [phone]  C5/6 6/7  . COLONOSCOPY    . ECTOPIC PREGNANCY SURGERY    . Left axillary cyst removal 1989    . MULTIPLE EXTRACTIONS WITH ALVEOLOPLASTY N/A 10/25/2017   Procedure: EXTRACTION NUMBERS FIVE, SEVEN, EIGHT, NINE, TEN, FIFTEEN, EIGHTEEN WITH ALVEOLOPLASTY, IRRIGATION AND DEBRIDEMENT LEFT SUBMANDIBULAR INFECTION;  Surgeon: Amy Bruce, DDS;  Location: [facility];  Service: Oral Surgery;  Laterality: N/A;  . PERIPHERAL VASCULAR ATHERECTOMY Right 09/23/2019   Procedure: PERIPHERAL VASCULAR  ATHERECTOMY;  Surgeon: Amy Hampshire, MD;  Location: [facility];  Service: Cardiovascular;  Laterality: Right;  SFA  . PERIPHERAL VASCULAR BALLOON ANGIOPLASTY Right 09/23/2019   Procedure: PERIPHERAL VASCULAR BALLOON ANGIOPLASTY;  Surgeon: Amy Hampshire, MD;  Location: [facility];  Service: Cardiovascular;  Laterality: Right;  SFA  . RADIOLOGY WITH ANESTHESIA N/A 11/18/2018   Procedure: MRI OF THE CERVICAL SPINE WITHOUT CONTRAST;  Surgeon: Radiologist, Medication, MD;  Location: [facility];  Service: Radiology;  Laterality: N/A;  . RADIOLOGY WITH ANESTHESIA N/A 12/03/2019   Procedure: MRI WITH ANESTHESIA CERVICAL WITH AND WITHOUT CONTRAST;  Surgeon: Radiologist, Medication, MD;  Location: [facility];  Service: Radiology;  Laterality: N/A;  . TONSILECTOMY, ADENOIDECTOMY, BILATERAL MYRINGOTOMY AND TUBES  1981   put not aware of tubes being put in ears  . TUBAL LIGATION  1981     Current Outpatient Medications  Medication Sig Dispense Refill  . albuterol (VENTOLIN HFA) 108 (90 Base) MCG/ACT inhaler INHALE 1 TO 2 PUFFS INTO THE LUNGS EVERY 4 HOURS AS NEEDED FOR WHEEZING 8.5 g 5  . amitriptyline (ELAVIL) 50 MG tablet TAKE 1 TABLET(50 MG) BY MOUTH AT BEDTIME 30 tablet 1  . aspirin EC 81 MG tablet Take 81 mg by mouth daily.    Marland Kitchen atorvastatin (LIPITOR) 80 MG tablet Take 80 mg by mouth daily.    . calcium carbonate (TUMS - DOSED IN MG ELEMENTAL CALCIUM) 500 MG chewable tablet Chew 2 tablets by mouth 2 (two) times daily as needed for indigestion or heartburn.     . Camphor-Eucalyptus-Menthol (VICKS VAPORUB EX) Apply 1 application topically daily as needed (congestion).    . clopidogrel (PLAVIX) 75 MG tablet Take 1 tablet (75 mg total) by mouth daily. 30 tablet 11  . cyclobenzaprine (FLEXERIL) 10 MG tablet Take 1 tablet (10 mg total) by mouth 3 (three) times daily as needed for muscle spasms. (Patient taking differently: Take 10 mg by mouth 3 (three) times daily.) 90 tablet 1  . diazepam  (VALIUM) 5 MG tablet Take 5 mg by mouth as directed.    . fexofenadine (ALLEGRA) 180 MG tablet Take 180 mg by mouth daily.    Marland Kitchen lidocaine (LIDODERM) 5 % UNWRAP AND APPLY 1 PATCH TO SKIN DAILY. REMOVE AND DISCARD PATCH WITHIN 12 HOURS OR AS DIRECTED BY MD. (Patient taking differently: Place 1 patch onto the skin daily as needed (pain.). UNWRAP AND APPLY 1 PATCH TO SKIN DAILY. REMOVE AND DISCARD PATCH WITHIN 12 HOURS OR AS DIRECTED BY MD.) 30 patch 1  . nicotine (NICODERM CQ - DOSED IN MG/24 HOURS) 21 mg/24hr patch Place 21 mg onto the skin daily as needed (smoking cessation).    . nicotine polacrilex (COMMIT) 2 MG lozenge Take 2 mg by mouth as needed for smoking cessation (Max 20 lozenges per day).     . Omega-3 Fatty Acids (FISH OIL) 1000 MG CAPS Take 1 capsule by mouth daily.    Marland Kitchen oxyCODONE-acetaminophen (PERCOCET) 7.5-325 MG tablet Take 1 tablet by mouth every  4 (four) hours as needed for severe pain.    . pantoprazole (PROTONIX) 40 MG tablet SMARTSIG:1 Tablet(s) By Mouth Every Evening    . pregabalin (LYRICA) 200 MG capsule TAKE 1 CAPSULE BY MOUTH EVERY MORNING, 1 CAPSULE AT NOON, AND 1 CAPSULE AT BEDTIME 90 capsule 0  . tiZANidine (ZANAFLEX) 4 MG tablet TAKE 1/2 TABLET(2 MG) BY MOUTH THREE TIMES DAILY 45 tablet 1  . vitamin B-12 (CYANOCOBALAMIN) 1000 MCG tablet Take 1,000 mcg by mouth daily.     No current facility-administered medications for this visit.    Allergies:   Carisoprodol and Duloxetine hcl    Social History:  The patient  reports that she has been smoking cigarettes. She has a 33.00 pack-year smoking history. She has never used smokeless tobacco. She reports current alcohol use of about 2.0 standard drinks of alcohol per week. She reports that she does not use drugs.   Family History:  The patient's family history includes Ovarian cancer in her mother.    ROS:  Please see the history of present illness.   Otherwise, review of systems are positive for none.   All other systems  are reviewed and negative.    PHYSICAL EXAM: VS:  BP 133/77   Pulse (!) 111   Ht 5' (1.524 m)   Wt 171 lb 9.6 oz (77.8 kg)   SpO2 98%   BMI 33.51 kg/m  , BMI Body mass index is 33.51 kg/m. GEN: Well nourished, well developed, in no acute distress  HEENT: normal  Neck: no JVD, carotid bruits, or masses Cardiac: RRR; no murmurs, rubs, or gallops Respiratory:  clear to auscultation bilaterally, normal work of breathing GI: soft, nontender, nondistended, + BS MS: no deformity or atrophy  Skin: warm and dry, no rash Neuro:  Strength and sensation are intact Psych: euthymic mood, full affect Vascular: Femoral pulses are +1 bilaterally.     EKG:  EKG is not  ordered today.   Recent Labs: 09/10/2019: ALT 18 12/03/2019: BUN 23; Creatinine, Ser 1.02; Hemoglobin 13.0; Platelets 223; Potassium 3.9; Sodium 140    Lipid Panel No results found for: CHOL, TRIG, HDL, CHOLHDL, VLDL, LDLCALC, LDLDIRECT    Wt Readings from Last 3 Encounters:  08/23/20 171 lb 9.6 oz (77.8 kg)  05/26/20 174 lb (78.9 kg)  04/27/20 184 lb 6.4 oz (83.6 kg)       No flowsheet data found.    ASSESSMENT AND PLAN:  1. Peripheral arterial disease: Status post recent right SFA endovascular intervention for severe right calf claudication.  No recurrent calf claudication.  Continue dual antiplatelet therapy as tolerated.  Repeat vascular studies in 6 months.  2.  Tobacco use: I again had a prolonged discussion with her about the importance of smoking cessation.  3.  Hyperlipidemia: Continue atorvastatin with a target LDL of less than 70.  4.  Essential hypertension: Blood pressure is reasonably controlled on current medications.  5.  Chronic venous insufficiency: She uses knee-high support stockings as needed.   Disposition:   FU with me in 6 months.  Signed,  Amy Sacramento, MD  08/23/2020 8:09 AM    [place]

## 2020-08-23 NOTE — Progress Notes (Signed)
Patient placed on full vent support following transport from OR to 4N27 by CRNA.

## 2020-08-23 NOTE — ED Notes (Signed)
50mg  Bolus of Prop

## 2020-08-23 NOTE — Transfer of Care (Signed)
Immediate Anesthesia Transfer of Care Note  Patient: Amy Bruce  Procedure(s) Performed: RIGHT CRANIOTOMY HEMATOMA EVACUATION SUBDURAL (Right )  Patient Location: ICU  Anesthesia Type:General  Level of Consciousness: sedated and Patient remains intubated per anesthesia plan  Airway & Oxygen Therapy: Patient remains intubated per anesthesia plan and Patient placed on Ventilator (see vital sign flow sheet for setting)  Post-op Assessment: Report given to RN and Post -op Vital signs reviewed and stable  Post vital signs: Reviewed and stable  Last Vitals:  Vitals Value Taken Time  BP 119/57 08/23/20 1427  Temp    Pulse 53 08/23/20 1427  Resp 11 08/23/20 1435  SpO2    Vitals shown include unvalidated device data.  Last Pain:  Vitals:   08/23/20 0942  TempSrc:   PainSc: 8          Complications: No complications documented.

## 2020-08-23 NOTE — Anesthesia Procedure Notes (Signed)
Arterial Line Insertion Start/End12/14/2021 12:30 PM, 08/23/2020 12:34 PM Performed by: Imagene Riches, CRNA, CRNA  Patient location: OR. Preanesthetic checklist: patient identified, IV checked, site marked, risks and benefits discussed, surgical consent, monitors and equipment checked, pre-op evaluation, timeout performed and anesthesia consent Left, radial was placed Catheter size: 20 G Hand hygiene performed  and maximum sterile barriers used  Allen's test indicative of satisfactory collateral circulation Attempts: 1 Procedure performed without using ultrasound guided technique. Following insertion, dressing applied and Biopatch. Post procedure assessment: normal  Patient tolerated the procedure well with no immediate complications.

## 2020-08-23 NOTE — Patient Instructions (Signed)
Medication Instructions:  No changes *If you need a refill on your cardiac medications before your next appointment, please call your pharmacy*   Lab Work: None ordered If you have labs (blood work) drawn today and your tests are completely normal, you will receive your results only by: Marland Kitchen MyChart Message (if you have MyChart) OR . A paper copy in the mail If you have any lab test that is abnormal or we need to change your treatment, we will call you to review the results.   Testing/Procedures: Your physician has requested that you have a lower extremity arterial duplex in May 2022. During this test, ultrasound is used to evaluate arterial blood flow in the legs. Allow one hour for this exam. There are no restrictions or special instructions. This will take place at [place], [address].  Your physician has requested that you have an ankle brachial index (ABI) in May 2022. During this test an ultrasound and blood pressure cuff are used to evaluate the arteries that supply the arms and legs with blood. Allow thirty minutes for this exam. There are no restrictions or special instructions. This will take place at [place], [address].   Follow-Up: At [facility], you and your health needs are our priority.  As part of our continuing mission to provide you with exceptional heart care, we have created designated Provider Care Teams.  These Care Teams include your primary Cardiologist (physician) and Advanced Practice Providers (APPs -  Physician Assistants and Nurse Practitioners) who all work together to provide you with the care you need, when you need it.  We recommend signing up for the patient portal called "MyChart".  Sign up information is provided on this After Visit Summary.  MyChart is used to connect with patients for Virtual Visits (Telemedicine).  Patients are able to view lab/test results, encounter notes, upcoming appointments, etc.  Non-urgent messages can be sent  to your provider as well.   To learn more about what you can do with MyChart, go to [email or web address].    Your next appointment:   6 month(s)  The format for your next appointment:   In Person  Provider:   Kathlyn Sacramento, MD

## 2020-08-23 NOTE — Progress Notes (Signed)
Belongings at bedside upon admission: Black jacket Shoes  Shirt Mask Black purse with keys, wallet, cigarettes (left in purse) Headphones Cell phone Eye glasses x2  Animal printed top

## 2020-08-23 NOTE — Op Note (Signed)
NEUROSURGERY OPERATIVE NOTE   PREOP DIAGNOSIS: Right acute subdural hematoma  POSTOP DIAGNOSIS: Same  PROCEDURE: 1. Right frontoparietal craniotomy for evacuation of subdural hematoma  SURGEON: Dr. Consuella Lose, MD  ASSISTANT: Ferne Reus, PA-C  ANESTHESIA: General Endotracheal  EBL: 200cc  SPECIMENS: None  DRAINS: Subdural JP  COMPLICATIONS: None immediate  CONDITION: Hemodynamically stable to neuro ICU  HISTORY: Amy Bruce is a 69 y.o. female presenting to the emergency department after falling when exiting a doctor's appointment earlier this morning.  Patient is on Plavix for peripheral vascular disease.  While she was noted to have a largely nonfocal exam, she also became progressively more difficult to arouse ultimately requiring intubation for airway protection.  CT scan demonstrated a large acute primarily frontoparietal subdural hematoma with significant amount of local mass-effect and midline shift.  Emergent craniotomy for evacuation was therefore indicated.  We were unable to contact the patient's daughter despite multiple attempts and therefore proceeded under emergent circumstances with implied consent.  PROCEDURE IN DETAIL: AThe patient was brought to the operating room. After induction of general anesthesia, the patient was positioned on the operative table in the supine position. All pressure points were meticulously padded.  Right sided sigmoid skin incision was then marked out and prepped and draped in the usual sterile fashion.  After timeout was conducted, the skin incision was infiltrated with local anesthetic. Skin incision was then made sharply, and Bovie electrocautery was used to dissect the subcutaneous tissue and the galea was incised. Hemostasis was achieved on the skin edges with Raney clips.  Elevator was then used to elevate the periosteum.  Bovie was used to elevate the superior portion of the temporalis muscle.  Self-retaining  retractors were then placed.  2 bur holes were then created and a craniotomy was fashioned and elevated. Hemostasis was then achieved on the dural surface with bipolar electrocautery. The dura was then opened in cruciate fashion with the monopolar.  Immediately underneath the dura we encountered a relatively large acute subdural hematoma.  Using a combination of blunt dissectors and the suction, a large frontotemporal clot was removed.  I was then able to directly visualize the subdural space.  I did not see any large blood clot remaining.  A 14 French red rubber catheter was then attached to overhead irrigation and the subdural space was irrigated with more than 1 L of normal saline irrigation.  During this process I was able to identify a small pial vessel on the parietal surface as an active source of arterial bleeding.  This was easily controlled with bipolar electrocautery.  No further active bleeding was identified, and the irrigation throughout the subdural space was noted to be clear.  A JP drain was then placed in the subdural space and tunneled subcutaneously.  The dural leaflets were then reapproximated loosely with 4-0 Vicryl stitches.  Hemostasis on the epidural plane was secured using a combination of bipolar electrocautery and morselized Gelfoam with thrombin.  A piece of Gelfoam was then placed over the dural surface.  The bone flap was replaced and plated with standard titanium plates and screws.  The galea was then reapproximated using a combination of interrupted 0 and 3-0 Vicryl stitches.  Skin was closed with staples.  Bacitracin ointment and sterile dressing was then applied.  The JP drain was secured with a 3-0 Vicryl stitch.  At the end of the case all sponge, needle, instrument, and cottonoid counts were correct.  The patient was then transferred to the stretcher  and taken to the neuro intensive care unit in stable hemodynamic condition.  The patient was administered 2 units of  platelets during the procedure in order to provide functional platelets because of her preoperative use of Plavix.   Consuella Lose, MD [facility]

## 2020-08-23 NOTE — ED Notes (Signed)
50mg  bolus of Propofol

## 2020-08-23 NOTE — Consult Note (Addendum)
NAME:  Amy Bruce, MRN:  [id number], DOB:  [birth year], LOS: 0 ADMISSION DATE:  08/23/2020, CONSULTATION DATE:  12/14 REFERRING MD:  Dr.Nundkumar, CHIEF COMPLAINT:  Fall   Brief History   Amy Bruce is a 69 yo F w/ PAD on DAPT admit after fall with trauma to head on metal pipe. Found to be altered with acute large subdural hemorrhage. Underwent evacuation in OR today and admit to neuro ICU.  History of present illness   Amy Bruce is a 69 yo F w/ chronic back pain, HLD, tobacco use, PAD on aspirin, plavix presenting after fall. She is intubated currently. History obtained via chart review.  Amy Bruce was in her usual state of health until earlier today when she was coming out of her cardiologist's office when she slipped and fell on a metal pipe. She hit her head without loss of consciousness and was urgently brought to [facility] for evaluation. She began to be more confused, somnolent in the ED and was found to have subdural hemorrhage with midline shift on CT head. She was intubated and neurosurgery emergently brought her to OR for evacuation with Dr.Nundkumar.  Past Medical History  PVD, PAD, HLD, HTN, Chronic pain, GERD  Significant Hospital Events   08/23/20 Present to ED  Consults:  Neurosurg  Procedures:  08/23/20 R craniotomy and drain placement  Significant Diagnostic Tests:  08/23/20 CT head IMPRESSION: Acute right cerebral convexity greater than falcine subdural hematoma. 12 mm leftward midline shift. No definite ventricle trapping at this time.  Micro Data:  12/14 COVID/flu neg  Antimicrobials:  12/14 Ancef for surgical prophylaxis  Interim history/subjective:   Amy Bruce was examined and evaluated at bedside this pm. Noted to be well sedated. Does withdraw from pain.  Objective   Blood pressure (!) 116/57, pulse (!) 57, temperature (!) 92.6 F (33.7 C), temperature source Rectal, resp. rate 17, height 5' (1.524 m), weight 76.7 kg, SpO2 100 %.    Vent Mode:  PRVC FiO2 (%):  [40 %-100 %] 40 % Set Rate:  [15 bmp] 15 bmp Vt Set:  [360 mL] 360 mL PEEP:  [5 cmH20] 5 cmH20 Plateau Pressure:  [12 cmH20-18 cmH20] 12 cmH20   Intake/Output Summary (Last 24 hours) at 08/23/2020 1651 Last data filed at 08/23/2020 1600 Gross per 24 hour  Intake 2056.31 ml  Output 1085 ml  Net 971.31 ml   Filed Weights   08/23/20 1500  Weight: 76.7 kg    Examination:  Gen: Well-developed, ill-appearing, NAD HEENT: Craniotomy site covered with fresh bandaging, drain intact with minimal bloody drainage Neck: supple,no JVD CV: Bradycardic, s1 s2 wnl Pulm: CTAB, No rales, no wheezes Abd: Soft, BS+, NTND, No rebound, no guarding Extm: ROM intact, Diminished peripheral pulses, no edema Skin: Dry, Warm, normal turgor  Neuro: GCS 6   Resolved Hospital Problem list     Assessment & Plan:   #Acute encephalpathy 2/2 large acute subdural hematoma S/p intraoperative evacuation. JP drain intact - Appreciate neuro surg recs - hydralazine/labetalol prn, keep systolic bp <427  #Acute hypoxic respiratory failure Remain intubated post-op for airway protection in setting of encephalopathy. Fio2 40, Peep 5, RR 15, TV 300. No known pulmonary disease. Most recent PFT showing ratio 76%, with 100% predicted Fev, Fvc. Mentation will be driving force in safe extubation - C/w mechanical ventilation - propofol gtt for sedation - Wean sedation as tolerated - SBTs - VAP protocol  #PAD On clopidogrel and aspirin at home. Received 2 units platelets in OR - Hold home  anti-platelet therapy - Trend cbc - Monitor drain output for bleed  Best practice (evaluated daily)   Diet: NPO Pain/Anxiety/Delirium protocol (if indicated): dilaudid, propofol VAP protocol (if indicated): Y DVT prophylaxis: SCD GI prophylaxis: PPI Glucose control: SSI Mobility: BR last date of multidisciplinary goals of care discussion N/A Family and staff present N Summary of discussion N/A Follow up  goals of care discussion due 08/24/20 Code Status: Full Disposition: ICU  Labs   CBC: Recent Labs  Lab 08/23/20 1120 08/23/20 1340 08/23/20 1603  WBC 7.8  --   --   NEUTROABS 5.3  --   --   HGB 11.6* 9.2* 9.9*  HCT 34.6* 27.0* 29.0*  MCV 87.6  --   --   PLT 189  --   --     Basic Metabolic Panel: Recent Labs  Lab 08/23/20 1120 08/23/20 1340 08/23/20 1603  NA 137 140 142  K 3.2* 4.0 3.6  CL 103  --   --   CO2 25  --   --   GLUCOSE 169*  --   --   BUN 10  --   --   CREATININE 0.94  --   --   CALCIUM 8.9  --   --    GFR: Estimated Creatinine Clearance: 51 mL/min (by C-G formula based on SCr of 0.94 mg/dL). Recent Labs  Lab 08/23/20 1120  WBC 7.8    Liver Function Tests: Recent Labs  Lab 08/23/20 1120  AST 16  ALT 11  ALKPHOS 65  BILITOT 0.4  PROT 6.1*  ALBUMIN 3.2*   No results for input(s): LIPASE, AMYLASE in the last 168 hours. Recent Labs  Lab 08/23/20 1120  AMMONIA 31    ABG    Component Value Date/Time   PHART 7.426 08/23/2020 1603   PCO2ART 40.7 08/23/2020 1603   PO2ART 279 (H) 08/23/2020 1603   HCO3 27.7 08/23/2020 1603   TCO2 29 08/23/2020 1603   O2SAT 100.0 08/23/2020 1603     Coagulation Profile: Recent Labs  Lab 08/23/20 1120  INR 0.9    Cardiac Enzymes: No results for input(s): CKTOTAL, CKMB, CKMBINDEX, TROPONINI in the last 168 hours.  HbA1C: Hgb A1c MFr Bld  Date/Time Value Ref Range Status  10/25/2017 08:29 AM 6.0 (H) 4.8 - 5.6 % Final    Comment:    (NOTE) Pre diabetes:          5.7%-6.4% Diabetes:              >6.4% Glycemic control for   <7.0% adults with diabetes     CBG: Recent Labs  Lab 08/23/20 1548  GLUCAP 141*    Review of Systems:   Unable to assess  Past Medical History  She,  has a past medical history of Anemia, Anxiety, Arthritis, Cancer ([place]), Complication of anesthesia, Fibromyalgia, GERD (gastroesophageal reflux disease), Heart murmur, Hyperlipidemia, Hypertension, Myalgia,  Neuropathy, PAD (peripheral artery disease) ([place]), PAD (peripheral artery disease) ([place]), Pre-diabetes, Pyelonephritis (09/10/2019), Tobacco abuse, and Tuberculosis.   Surgical History    Past Surgical History:  Procedure Laterality Date  . ABDOMINAL AORTOGRAM W/LOWER EXTREMITY N/A 04/02/2018   Procedure: ABDOMINAL AORTOGRAM W/LOWER EXTREMITY;  Surgeon: Wellington Hampshire, MD;  Location: [place] [facility];  Service: Cardiovascular;  Laterality: N/A;  . ABDOMINAL AORTOGRAM W/LOWER EXTREMITY N/A 09/23/2019   Procedure: ABDOMINAL AORTOGRAM W/LOWER EXTREMITY;  Surgeon: Wellington Hampshire, MD;  Location: [place] [facility];  Service: Cardiovascular;  Laterality: N/A;  . ABDOMINAL HYSTERECTOMY  [facility]  [phone]   C5/6 6/7  . COLONOSCOPY    . ECTOPIC PREGNANCY SURGERY    . Left axillary cyst removal 1989    . MULTIPLE EXTRACTIONS WITH ALVEOLOPLASTY N/A 10/25/2017   Procedure: EXTRACTION NUMBERS FIVE, SEVEN, EIGHT, NINE, TEN, FIFTEEN, EIGHTEEN WITH ALVEOLOPLASTY, IRRIGATION AND DEBRIDEMENT LEFT SUBMANDIBULAR INFECTION;  Surgeon: Diona Browner, DDS;  Location: [place];  Service: Oral Surgery;  Laterality: N/A;  . PERIPHERAL VASCULAR ATHERECTOMY Right 09/23/2019   Procedure: PERIPHERAL VASCULAR ATHERECTOMY;  Surgeon: Wellington Hampshire, MD;  Location: [place] [facility];  Service: Cardiovascular;  Laterality: Right;  SFA  . PERIPHERAL VASCULAR BALLOON ANGIOPLASTY Right 09/23/2019   Procedure: PERIPHERAL VASCULAR BALLOON ANGIOPLASTY;  Surgeon: Wellington Hampshire, MD;  Location: [place] [facility];  Service: Cardiovascular;  Laterality: Right;  SFA  . RADIOLOGY WITH ANESTHESIA N/A 11/18/2018   Procedure: MRI OF THE CERVICAL SPINE WITHOUT CONTRAST;  Surgeon: Radiologist, Medication, MD;  Location: [place];  Service: Radiology;  Laterality: N/A;  . RADIOLOGY WITH ANESTHESIA N/A 12/03/2019   Procedure: MRI WITH ANESTHESIA CERVICAL WITH AND WITHOUT CONTRAST;  Surgeon: Radiologist, Medication, MD;   Location: [place];  Service: Radiology;  Laterality: N/A;  . TONSILECTOMY, ADENOIDECTOMY, BILATERAL MYRINGOTOMY AND TUBES  1981   put not aware of tubes being put in ears  . TUBAL LIGATION  1981     Social History   reports that she has been smoking cigarettes. She has a 33.00 pack-year smoking history. She has never used smokeless tobacco. She reports current alcohol use of about 2.0 standard drinks of alcohol per week. She reports that she does not use drugs.   Family History   Her family history includes Ovarian cancer in her mother.   Allergies Allergies  Allergen Reactions  . Carisoprodol Swelling  . Duloxetine Hcl Swelling and Hypertension     Home Medications  Prior to Admission medications   Medication Sig Start Date End Date Taking? Authorizing Provider  albuterol (VENTOLIN HFA) 108 (90 Base) MCG/ACT inhaler INHALE 1 TO 2 PUFFS INTO THE LUNGS EVERY 4 HOURS AS NEEDED FOR WHEEZING 06/14/20   Magdalen Spatz, NP  amitriptyline (ELAVIL) 50 MG tablet TAKE 1 TABLET(50 MG) BY MOUTH AT BEDTIME 07/12/20   Jamse Arn, MD  aspirin EC 81 MG tablet Take 81 mg by mouth daily.    [provider]  atorvastatin (LIPITOR) 80 MG tablet Take 80 mg by mouth daily. 04/06/20   [provider]  calcium carbonate (TUMS - DOSED IN MG ELEMENTAL CALCIUM) 500 MG chewable tablet Chew 2 tablets by mouth 2 (two) times daily as needed for indigestion or heartburn.     [provider]  Camphor-Eucalyptus-Menthol (VICKS VAPORUB EX) Apply 1 application topically daily as needed (congestion).    [provider]  clopidogrel (PLAVIX) 75 MG tablet Take 1 tablet (75 mg total) by mouth daily. 08/23/20 08/23/21  Wellington Hampshire, MD  cyclobenzaprine (FLEXERIL) 10 MG tablet Take 1 tablet (10 mg total) by mouth 3 (three) times daily as needed for muscle spasms. Patient taking differently: Take 10 mg by mouth 3 (three) times daily. 04/08/19   Jamse Arn, MD  diazepam (VALIUM)  5 MG tablet Take 5 mg by mouth as directed. 05/18/20   [provider]  fexofenadine (ALLEGRA) 180 MG tablet Take 180 mg by mouth daily.    [provider]  lidocaine (LIDODERM) 5 % UNWRAP AND APPLY 1 PATCH TO SKIN  DAILY. REMOVE AND DISCARD PATCH WITHIN 12 HOURS OR AS DIRECTED BY MD. Patient taking differently: Place 1 patch onto the skin daily as needed (pain.). UNWRAP AND APPLY 1 PATCH TO SKIN DAILY. REMOVE AND DISCARD PATCH WITHIN 12 HOURS OR AS DIRECTED BY MD. 01/23/18   Jamse Arn, MD  nicotine (NICODERM CQ - DOSED IN MG/24 HOURS) 21 mg/24hr patch Place 21 mg onto the skin daily as needed (smoking cessation).    [provider]  nicotine polacrilex (COMMIT) 2 MG lozenge Take 2 mg by mouth as needed for smoking cessation (Max 20 lozenges per day).     [provider]  Omega-3 Fatty Acids (FISH OIL) 1000 MG CAPS Take 1 capsule by mouth daily.    [provider]  oxyCODONE-acetaminophen (PERCOCET) 7.5-325 MG tablet Take 1 tablet by mouth every 4 (four) hours as needed for severe pain.    [provider]  pantoprazole (PROTONIX) 40 MG tablet SMARTSIG:1 Tablet(s) By Mouth Every Evening 04/30/20   [provider]  pregabalin (LYRICA) 200 MG capsule TAKE 1 CAPSULE BY MOUTH EVERY MORNING, 1 CAPSULE AT NOON, AND 1 CAPSULE AT BEDTIME 05/23/20   Patel, Domenick Bookbinder, MD  tiZANidine (ZANAFLEX) 4 MG tablet TAKE 1/2 TABLET(2 MG) BY MOUTH THREE TIMES DAILY 04/08/20   Jamse Arn, MD  vitamin B-12 (CYANOCOBALAMIN) 1000 MCG tablet Take 1,000 mcg by mouth daily.    [provider]   Mosetta Anis, MD 08/23/2020, 5:15 PM PGY-3, [place] [facility] Pager: [phone]  Critical care time:

## 2020-08-24 ENCOUNTER — Inpatient Hospital Stay (HOSPITAL_COMMUNITY): Payer: Medicare Other

## 2020-08-24 ENCOUNTER — Encounter (HOSPITAL_COMMUNITY): Payer: Self-pay | Admitting: Neurosurgery

## 2020-08-24 LAB — POCT I-STAT 7, (LYTES, BLD GAS, ICA,H+H)
Acid-Base Excess: 4 mmol/L — ABNORMAL HIGH (ref 0.0–2.0)
Bicarbonate: 28.8 mmol/L — ABNORMAL HIGH (ref 20.0–28.0)
Calcium, Ion: 1.18 mmol/L (ref 1.15–1.40)
HCT: 31 % — ABNORMAL LOW (ref 36.0–46.0)
Hemoglobin: 10.5 g/dL — ABNORMAL LOW (ref 12.0–15.0)
O2 Saturation: 99 %
Patient temperature: 97.6
Potassium: 3.3 mmol/L — ABNORMAL LOW (ref 3.5–5.1)
Sodium: 143 mmol/L (ref 135–145)
TCO2: 30 mmol/L (ref 22–32)
pCO2 arterial: 44.6 mmHg (ref 32.0–48.0)
pH, Arterial: 7.415 (ref 7.350–7.450)
pO2, Arterial: 131 mmHg — ABNORMAL HIGH (ref 83.0–108.0)

## 2020-08-24 LAB — BASIC METABOLIC PANEL
Anion gap: 10 (ref 5–15)
BUN: 7 mg/dL — ABNORMAL LOW (ref 8–23)
CO2: 26 mmol/L (ref 22–32)
Calcium: 8.6 mg/dL — ABNORMAL LOW (ref 8.9–10.3)
Chloride: 107 mmol/L (ref 98–111)
Creatinine, Ser: 0.77 mg/dL (ref 0.44–1.00)
GFR, Estimated: >60 mL/min (ref >60)
Glucose, Bld: 126 mg/dL — ABNORMAL HIGH (ref 70–99)
Potassium: 3.6 mmol/L (ref 3.5–5.1)
Sodium: 143 mmol/L (ref 135–145)

## 2020-08-24 LAB — PREPARE PLATELET PHERESIS
Unit division: 0
Unit division: 0

## 2020-08-24 LAB — BPAM PLATELET PHERESIS
Blood Product Expiration Date: 202112162359
Blood Product Expiration Date: 202112162359
ISSUE DATE / TIME: 202112141300
ISSUE DATE / TIME: 202112141300
Unit Type and Rh: 5100
Unit Type and Rh: 6200

## 2020-08-24 LAB — CBC
HCT: 30.9 % — ABNORMAL LOW (ref 36.0–46.0)
Hemoglobin: 10.8 g/dL — ABNORMAL LOW (ref 12.0–15.0)
MCH: 29.8 pg (ref 26.0–34.0)
MCHC: 35 g/dL (ref 30.0–36.0)
MCV: 85.1 fL (ref 80.0–100.0)
Platelets: 250 10*3/uL (ref 150–400)
RBC: 3.63 MIL/uL — ABNORMAL LOW (ref 3.87–5.11)
RDW: 16.1 % — ABNORMAL HIGH (ref 11.5–15.5)
WBC: 10.8 10*3/uL — ABNORMAL HIGH (ref 4.0–10.5)
nRBC: 0 % (ref 0.0–0.2)

## 2020-08-24 LAB — PHOSPHORUS: Phosphorus: 2.8 mg/dL (ref 2.5–4.6)

## 2020-08-24 LAB — MAGNESIUM: Magnesium: 1.7 mg/dL (ref 1.7–2.4)

## 2020-08-24 MED ORDER — POTASSIUM PHOSPHATES 15 MMOLE/5ML IV SOLN
15.0000 mmol | Freq: Once | INTRAVENOUS | Status: AC
  Administered 2020-08-24: 12:00:00 15 mmol via INTRAVENOUS
  Filled 2020-08-24: qty 5

## 2020-08-24 MED ORDER — ORAL CARE MOUTH RINSE
15.0000 mL | Freq: Two times a day (BID) | OROMUCOSAL | Status: DC
  Administered 2020-08-24 – 2020-08-26 (×6): 15 mL via OROMUCOSAL

## 2020-08-24 MED ORDER — MAGNESIUM SULFATE 2 GM/50ML IV SOLN
2.0000 g | Freq: Once | INTRAVENOUS | Status: AC
  Administered 2020-08-24: 10:00:00 2 g via INTRAVENOUS
  Filled 2020-08-24: qty 50

## 2020-08-24 MED ORDER — CLEVIDIPINE BUTYRATE 0.5 MG/ML IV EMUL
0.0000 mg/h | INTRAVENOUS | Status: DC
  Administered 2020-08-24: 12:00:00 1 mg/h via INTRAVENOUS
  Filled 2020-08-24 (×2): qty 50

## 2020-08-24 MED ORDER — DEXMEDETOMIDINE HCL IN NACL 400 MCG/100ML IV SOLN
0.2000 ug/kg/h | INTRAVENOUS | Status: DC
  Administered 2020-08-24 – 2020-08-25 (×2): 0.4 ug/kg/h via INTRAVENOUS
  Administered 2020-08-26: 18:00:00 0.2 ug/kg/h via INTRAVENOUS
  Administered 2020-08-26: 01:00:00 0.6 ug/kg/h via INTRAVENOUS
  Filled 2020-08-24 (×6): qty 100

## 2020-08-24 MED ORDER — SODIUM CHLORIDE 0.9 % IV SOLN
INTRAVENOUS | Status: DC | PRN
  Administered 2020-08-24: 10:00:00 500 mL via INTRAVENOUS

## 2020-08-24 MED ORDER — ENALAPRILAT 1.25 MG/ML IV SOLN
1.2500 mg | Freq: Four times a day (QID) | INTRAVENOUS | Status: DC | PRN
  Administered 2020-08-24: 1.25 mg via INTRAVENOUS
  Filled 2020-08-24: qty 1

## 2020-08-24 NOTE — Progress Notes (Signed)
SLP Cancellation Note  Patient Details Name: Amy Bruce MRN: [id number] DOB: [birth year]   Cancelled treatment:        Received order for swallow and cognition assessment. When arrived pt demonstrating agitation behaviors with nurses in room- will receive meds to calm her. ST will follow up tomorrow for swallow and speech-cognitive assessments.    Houston Siren 08/24/2020, 1:32 PM  Orbie Pyo Colvin Caroli.Ed Risk analyst [phone] Office [phone]

## 2020-08-24 NOTE — Progress Notes (Signed)
Spoke with [name], [name]'s daughter, [phone], and informed [name] of her mother's current admission and clinical course. [name] mentions that she lives in [place] and may not be able to visit urgently but will try to lay eyes on her at some this week. All other questions and concerns addressed.

## 2020-08-24 NOTE — Progress Notes (Signed)
NAME:  Amy Bruce, MRN:  [id number], DOB:  1950/10/02, LOS: 1 ADMISSION DATE:  08/23/2020, CONSULTATION DATE:  12/14 REFERRING MD:  Dr.Nundkumar, CHIEF COMPLAINT:  Fall   Brief History   Amy Bruce is a 69 yo F w/ PAD on DAPT admit after fall with trauma to head on metal pipe. Found to be altered with acute large subdural hemorrhage. Underwent evacuation in OR today and admit to neuro ICU.  History of present illness   Amy Bruce is a 69 yo F w/ chronic back pain, HLD, tobacco use, PAD on aspirin, plavix presenting after fall. She is intubated currently. History obtained via chart review.  Amy Bruce was in her usual state of health until earlier today when she was coming out of her cardiologist's office when she slipped and fell on a metal pipe. She hit her head without loss of consciousness and was urgently brought to [facility] for evaluation. She began to be more confused, somnolent in the ED and was found to have subdural hemorrhage with midline shift on CT head. She was intubated and neurosurgery emergently brought her to OR for evacuation with Dr.Nundkumar.  Past Medical History  PVD, PAD, HLD, HTN, Chronic pain, GERD  Significant Hospital Events   08/23/20 Present to ED  Consults:  Neurosurg  Procedures:  08/23/20 R craniotomy and drain placement  Significant Diagnostic Tests:  08/23/20 CT head IMPRESSION: Acute right cerebral convexity greater than falcine subdural hematoma. 12 mm leftward midline shift. No definite ventricle trapping at this time.  Micro Data:  12/14 COVID/flu neg  Antimicrobials:  12/14 Ancef for surgical prophylaxis  Interim history/subjective:  O/N events: None  [name] was examined and evaluated at bedside this pm. Noted to be sedated but agitated upon awakening to tactile stimuli. Was able to follow some directions intermittently  Objective   Blood pressure 111/60, pulse 76, temperature 99 F (37.2 C), temperature source Axillary, resp. rate 15,  height 5' (1.524 m), weight 76.7 kg, SpO2 100 %.    Vent Mode: PRVC FiO2 (%):  [40 %-100 %] 40 % Set Rate:  [15 bmp] 15 bmp Vt Set:  [360 mL] 360 mL PEEP:  [5 cmH20] 5 cmH20 Plateau Pressure:  [12 cmH20-18 cmH20] 14 cmH20   Intake/Output Summary (Last 24 hours) at 08/24/2020 0701 Last data filed at 08/24/2020 0600 Gross per 24 hour  Intake 3411.15 ml  Output 2715 ml  Net 696.15 ml   Filed Weights   08/23/20 1500  Weight: 76.7 kg    Examination:   Gen: Well-developed, ill-appearing, agitated HEENT: Craniotomy site covered with bandaging. drain with bloody drainage Neck: supple, ROM intact, no JVD, no cervical adenopathy CV: RRR, S1, S2 normal, No rubs, no murmurs, no gallops Pulm: CTAB, No rales, no wheezes Abd: Soft, BS+, NTND, No rebound, no guarding Extm: ROM intact, Peripheral pulses intact, No peripheral edema Skin: Dry, Warm, normal turgor.  Neuro: Agitated, following directions intermittently  Resolved Hospital Problem list     Assessment & Plan:   #Acute encephalpathy 2/2 large acute subdural hematoma S/p intraoperative evacuation. JP drain intact. Encephalopathy appear to be improving. Agitated but does follow directions somewhat. Possible extubation today. Hemoglobin stable 11.6->9.2->10.8 - Appreciate neuro surg recs - hydralazine/labetalol prn, keep systolic bp <622  #Acute hypoxic respiratory failure Remain intubated post-op for airway protection in setting of encephalopathy. Fio2 40, Peep 5, RR 15, TV 300. No known pulmonary disease. Most recent PFT showing ratio 76%, with 100% predicted Fev, Fvc. Appear to be more alert. Am  abg show normal pH Follow directions intermittently today - C/w mechanical ventilation - propofol gtt for sedation - Wean sedation as tolerated - SBTs - VAP protocol  #PAD On clopidogrel and aspirin at home. Received 2 units platelets in OR. Platelet count at 250. - Hold home anti-platelet therapy - Trend cbc - Monitor drain  output for bleed  Best practice (evaluated daily)   Diet: NPO til S&S Pain/Anxiety/Delirium protocol (if indicated): dilaudid, propofol VAP protocol (if indicated): Y DVT prophylaxis: SCD GI prophylaxis: PPI Glucose control: SSI Mobility: BR last date of multidisciplinary goals of care discussion N/A Family and staff present N Summary of discussion N/A Follow up goals of care discussion due 08/24/20 Code Status: Full Disposition: ICU  Labs   CBC: Recent Labs  Lab 08/23/20 1120 08/23/20 1340 08/23/20 1603 08/24/20 0426 08/24/20 0505  WBC 7.8  --   --  10.8*  --   NEUTROABS 5.3  --   --   --   --   HGB 11.6* 9.2* 9.9* 10.8* 10.5*  HCT 34.6* 27.0* 29.0* 30.9* 31.0*  MCV 87.6  --   --  85.1  --   PLT 189  --   --  250  --     Basic Metabolic Panel: Recent Labs  Lab 08/23/20 1120 08/23/20 1340 08/23/20 1603 08/24/20 0426 08/24/20 0505  NA 137 140 142 143 143  K 3.2* 4.0 3.6 3.6 3.3*  CL 103  --   --  107  --   CO2 25  --   --  26  --   GLUCOSE 169*  --   --  126*  --   BUN 10  --   --  7*  --   CREATININE 0.94  --   --  0.77  --   CALCIUM 8.9  --   --  8.6*  --   MG  --   --   --  1.7  --   PHOS  --   --   --  2.8  --    GFR: Estimated Creatinine Clearance: 59.9 mL/min (by C-G formula based on SCr of 0.77 mg/dL). Recent Labs  Lab 08/23/20 1120 08/24/20 0426  WBC 7.8 10.8*    Liver Function Tests: Recent Labs  Lab 08/23/20 1120  AST 16  ALT 11  ALKPHOS 65  BILITOT 0.4  PROT 6.1*  ALBUMIN 3.2*   No results for input(s): LIPASE, AMYLASE in the last 168 hours. Recent Labs  Lab 08/23/20 1120  AMMONIA 31    ABG    Component Value Date/Time   PHART 7.415 08/24/2020 0505   PCO2ART 44.6 08/24/2020 0505   PO2ART 131 (H) 08/24/2020 0505   HCO3 28.8 (H) 08/24/2020 0505   TCO2 30 08/24/2020 0505   O2SAT 99.0 08/24/2020 0505     Coagulation Profile: Recent Labs  Lab 08/23/20 1120  INR 0.9    Cardiac Enzymes: No results for input(s):  CKTOTAL, CKMB, CKMBINDEX, TROPONINI in the last 168 hours.  HbA1C: Hgb A1c MFr Bld  Date/Time Value Ref Range Status  10/25/2017 08:29 AM 6.0 (H) 4.8 - 5.6 % Final    Comment:    (NOTE) Pre diabetes:          5.7%-6.4% Diabetes:              >6.4% Glycemic control for   <7.0% adults with diabetes     CBG: Recent Labs  Lab 08/23/20 1548  GLUCAP 141*  Review of Systems:   Unable to assess  Past Medical History  She,  has a past medical history of Anemia, Anxiety, Arthritis, Cancer ([place]), Complication of anesthesia, Fibromyalgia, GERD (gastroesophageal reflux disease), Heart murmur, Hyperlipidemia, Hypertension, Myalgia, Neuropathy, PAD (peripheral artery disease) ([place]), PAD (peripheral artery disease) ([place]), Pre-diabetes, Pyelonephritis (09/10/2019), Tobacco abuse, and Tuberculosis.   Surgical History    Past Surgical History:  Procedure Laterality Date   ABDOMINAL AORTOGRAM W/LOWER EXTREMITY N/A 04/02/2018   Procedure: ABDOMINAL AORTOGRAM W/LOWER EXTREMITY;  Surgeon: Wellington Hampshire, MD;  Location: [place] [facility];  Service: Cardiovascular;  Laterality: N/A;   ABDOMINAL AORTOGRAM W/LOWER EXTREMITY N/A 09/23/2019   Procedure: ABDOMINAL AORTOGRAM W/LOWER EXTREMITY;  Surgeon: Wellington Hampshire, MD;  Location: [place] [facility];  Service: Cardiovascular;  Laterality: N/A;   ABDOMINAL HYSTERECTOMY  1999   CERVICAL SPINE SURGERY  [phone]   C5/6 6/7   COLONOSCOPY     ECTOPIC PREGNANCY SURGERY     Left axillary cyst removal 1989     MULTIPLE EXTRACTIONS WITH ALVEOLOPLASTY N/A 10/25/2017   Procedure: EXTRACTION NUMBERS FIVE, SEVEN, EIGHT, NINE, TEN, FIFTEEN, EIGHTEEN WITH ALVEOLOPLASTY, IRRIGATION AND DEBRIDEMENT LEFT SUBMANDIBULAR INFECTION;  Surgeon: Diona Browner, DDS;  Location: [place];  Service: Oral Surgery;  Laterality: N/A;   PERIPHERAL VASCULAR ATHERECTOMY Right 09/23/2019   Procedure: PERIPHERAL VASCULAR ATHERECTOMY;  Surgeon: Wellington Hampshire, MD;   Location: [place] [facility];  Service: Cardiovascular;  Laterality: Right;  SFA   PERIPHERAL VASCULAR BALLOON ANGIOPLASTY Right 09/23/2019   Procedure: PERIPHERAL VASCULAR BALLOON ANGIOPLASTY;  Surgeon: Wellington Hampshire, MD;  Location: [place] [facility];  Service: Cardiovascular;  Laterality: Right;  SFA   RADIOLOGY WITH ANESTHESIA N/A 11/18/2018   Procedure: MRI OF THE CERVICAL SPINE WITHOUT CONTRAST;  Surgeon: Radiologist, Medication, MD;  Location: [place];  Service: Radiology;  Laterality: N/A;   RADIOLOGY WITH ANESTHESIA N/A 12/03/2019   Procedure: MRI WITH ANESTHESIA CERVICAL WITH AND WITHOUT CONTRAST;  Surgeon: Radiologist, Medication, MD;  Location: [place];  Service: Radiology;  Laterality: N/A;   TONSILECTOMY, ADENOIDECTOMY, BILATERAL MYRINGOTOMY AND TUBES  1981   put not aware of tubes being put in [place] History   reports that she has been smoking cigarettes. She has a 33.00 pack-year smoking history. She has never used smokeless tobacco. She reports current alcohol use of about 2.0 standard drinks of alcohol per week. She reports that she does not use drugs.   Family History   Her family history includes Ovarian cancer in her mother.   Allergies Allergies  Allergen Reactions   Carisoprodol Swelling   Duloxetine Hcl Swelling and Hypertension     Home Medications  Prior to Admission medications   Medication Sig Start Date End Date Taking? Authorizing Provider  albuterol (VENTOLIN HFA) 108 (90 Base) MCG/ACT inhaler INHALE 1 TO 2 PUFFS INTO THE LUNGS EVERY 4 HOURS AS NEEDED FOR WHEEZING 06/14/20   Magdalen Spatz, NP  amitriptyline (ELAVIL) 50 MG tablet TAKE 1 TABLET(50 MG) BY MOUTH AT BEDTIME 07/12/20   Jamse Arn, MD  aspirin EC 81 MG tablet Take 81 mg by mouth daily.    [provider]  atorvastatin (LIPITOR) 80 MG tablet Take 80 mg by mouth daily. 04/06/20   [provider]  calcium carbonate (TUMS - DOSED IN MG  ELEMENTAL CALCIUM) 500 MG chewable tablet Chew 2 tablets by mouth 2 (two) times daily as needed for  indigestion or heartburn.     [provider]  Camphor-Eucalyptus-Menthol (VICKS VAPORUB EX) Apply 1 application topically daily as needed (congestion).    [provider]  clopidogrel (PLAVIX) 75 MG tablet Take 1 tablet (75 mg total) by mouth daily. 08/23/20 08/23/21  Wellington Hampshire, MD  cyclobenzaprine (FLEXERIL) 10 MG tablet Take 1 tablet (10 mg total) by mouth 3 (three) times daily as needed for muscle spasms. Patient taking differently: Take 10 mg by mouth 3 (three) times daily. 04/08/19   Jamse Arn, MD  diazepam (VALIUM) 5 MG tablet Take 5 mg by mouth as directed. 05/18/20   [provider]  fexofenadine (ALLEGRA) 180 MG tablet Take 180 mg by mouth daily.    [provider]  lidocaine (LIDODERM) 5 % UNWRAP AND APPLY 1 PATCH TO SKIN DAILY. REMOVE AND DISCARD PATCH WITHIN 12 HOURS OR AS DIRECTED BY MD. Patient taking differently: Place 1 patch onto the skin daily as needed (pain.). UNWRAP AND APPLY 1 PATCH TO SKIN DAILY. REMOVE AND DISCARD PATCH WITHIN 12 HOURS OR AS DIRECTED BY MD. 01/23/18   Jamse Arn, MD  nicotine (NICODERM CQ - DOSED IN MG/24 HOURS) 21 mg/24hr patch Place 21 mg onto the skin daily as needed (smoking cessation).    [provider]  nicotine polacrilex (COMMIT) 2 MG lozenge Take 2 mg by mouth as needed for smoking cessation (Max 20 lozenges per day).     [provider]  Omega-3 Fatty Acids (FISH OIL) 1000 MG CAPS Take 1 capsule by mouth daily.    [provider]  oxyCODONE-acetaminophen (PERCOCET) 7.5-325 MG tablet Take 1 tablet by mouth every 4 (four) hours as needed for severe pain.    [provider]  pantoprazole (PROTONIX) 40 MG tablet SMARTSIG:1 Tablet(s) By Mouth Every Evening 04/30/20   [provider]  pregabalin (LYRICA) 200 MG capsule TAKE 1 CAPSULE BY MOUTH EVERY MORNING, 1  CAPSULE AT NOON, AND 1 CAPSULE AT BEDTIME 05/23/20   Patel, Domenick Bookbinder, MD  tiZANidine (ZANAFLEX) 4 MG tablet TAKE 1/2 TABLET(2 MG) BY MOUTH THREE TIMES DAILY 04/08/20   Jamse Arn, MD  vitamin B-12 (CYANOCOBALAMIN) 1000 MCG tablet Take 1,000 mcg by mouth daily.    [provider]   Mosetta Anis, MD 08/24/2020, 7:01 AM PGY-3, [facility] Pager: [phone]  Critical care time:

## 2020-08-24 NOTE — Evaluation (Signed)
Occupational Therapy Evaluation Patient Details Name: Amy Bruce MRN: [id number] DOB: 02-15-1951 Today's Date: 08/24/2020    History of Present Illness 69 yo female admitted to ED On 12/14 after fall at doctor's office. CT demonstrated large right frontoparietal SDH with ~22mm R>L MLS and effacement of the right lateral ventricle, and early transtentorial uncal herniation; s/p R frontoparietal craniotomy for evacuation of SDH. PMH includes cervical fusion 04/2020, tobacco abuse, HTN, PAD, anxiety, fibromyalgia, ovarian cancer, pre-DM.   Clinical Impression   This 69 y/o female presents with the above. Pt lethargic this session and unable to provide home setup/PLOF. Pt following 1 step motor commands with RLE only during session. Notable tone in LUE/LLE with PROM attempts. Use of bed egress to chair position to promote upright with VSS throughout. Pt will arouse/open eyes for approx 10 sec but having much difficulty remaining awake/alert during session. Am hopeful pt to progress well as she becomes more awake and able to participate. She will likely require post acute rehab services at time of discharge. Will follow for pt progress and update as appropriate.      Follow Up Recommendations  Supervision/Assistance - 24 hour;CIR;Other (comment) (pending progress/caregiver support)    Equipment Recommendations  Other (comment) (TBD)           Precautions / Restrictions Precautions Precautions: Fall Precaution Comments: restraints, agitation, JP drain from crani Restrictions Weight Bearing Restrictions: No      Mobility Bed Mobility Overal bed mobility: Needs Assistance Bed Mobility: Supine to Sit     Supine to sit: Total assist     General bed mobility comments: pull to sit requiring total assist with complete truncal support, pt resisting motion and returning to supine and R lateral leaning.    Transfers                 General transfer comment: pull to sit  requiring total assist with complete truncal support, pt resisting motion and returning to supine and R lateral leaning.    Balance                                           ADL either performed or assessed with clinical judgement   ADL Overall ADL's : Needs assistance/impaired                                       General ADL Comments: totalA currently     Vision   Additional Comments: will further assess     Perception     Praxis      Pertinent Vitals/Pain Pain Assessment: Faces Faces Pain Scale: Hurts little more Pain Location: generalized with limb movement Pain Descriptors / Indicators: Grimacing;Guarding Pain Intervention(s): Limited activity within patient's tolerance;Monitored during session     Hand Dominance     Extremity/Trunk Assessment Upper Extremity Assessment Upper Extremity Assessment: Generalized weakness;LUE deficits/detail;RUE deficits/detail RUE Deficits / Details: pt appears to have limited shoulder ROM, difficult to fully assess given lethargy RUE Coordination: decreased gross motor LUE Deficits / Details: edemous LUE due to infiltrated IV and notable tone with attempts at PROM LUE Sensation: decreased light touch LUE Coordination: decreased fine motor;decreased gross motor   Lower Extremity Assessment Lower Extremity Assessment: Defer to PT evaluation RLE Deficits / Details: full AROM appreciated LLE Deficits /  Details: demonstrating hypertonicity in hip and knee flexion, hip abd       Communication Communication Communication: Expressive difficulties;Receptive difficulties (lethargic today)   Cognition Arousal/Alertness: Lethargic Behavior During Therapy: Flat affect Overall Cognitive Status: Difficult to assess                                 General Comments: follows x1 command during session "move your foot". Pt with waking periods ~10 seconds at a time, requires sternal rub, loud  talking, and pulling trunk to sit to wake.   General Comments       Exercises Exercises: Other exercises Other Exercises Other Exercises: general PROM to bil UE and LE   Shoulder Instructions      Home Living Family/patient expects to be discharged to:: Private residence Living Arrangements: Alone                               Additional Comments: unsure of PLOF or PMH, pt not conversant on eval.      Prior Functioning/Environment          Comments: unsure of PLOF        OT Problem List: Decreased strength;Decreased range of motion;Decreased activity tolerance;Impaired balance (sitting and/or standing);Decreased coordination;Decreased cognition;Decreased knowledge of use of DME or AE;Decreased knowledge of precautions;Impaired UE functional use      OT Treatment/Interventions: Self-care/ADL training;Therapeutic exercise;Energy conservation;Neuromuscular education;DME and/or AE instruction;Therapeutic activities;Cognitive remediation/compensation;Visual/perceptual remediation/compensation;Other (comment)    OT Goals(Current goals can be found in the care plan section) Acute Rehab OT Goals Patient Stated Goal: unable to state OT Goal Formulation: Patient unable to participate in goal setting Time For Goal Achievement: 09/07/20 Potential to Achieve Goals: Good  OT Frequency: Min 2X/week   Barriers to D/C:            Co-evaluation PT/OT/SLP Co-Evaluation/Treatment: Yes Reason for Co-Treatment: Complexity of the patient's impairments (multi-system involvement);For patient/therapist safety;To address functional/ADL transfers PT goals addressed during session: Mobility/safety with mobility;Strengthening/ROM OT goals addressed during session: Strengthening/ROM      AM-PAC OT "6 Clicks" Daily Activity     Outcome Measure Help from another person eating meals?: Total Help from another person taking care of personal grooming?: Total Help from another person  toileting, which includes using toliet, bedpan, or urinal?: Total Help from another person bathing (including washing, rinsing, drying)?: Total Help from another person to put on and taking off regular upper body clothing?: Total Help from another person to put on and taking off regular lower body clothing?: Total 6 Click Score: 6   End of Session Equipment Utilized During Treatment: Oxygen Nurse Communication: Mobility status  Activity Tolerance: Patient limited by lethargy Patient left: in bed;with call bell/phone within reach;with bed alarm set;with restraints reapplied  OT Visit Diagnosis: Other abnormalities of gait and mobility (R26.89);Muscle weakness (generalized) (M62.81);Other symptoms and signs involving cognitive function;Other symptoms and signs involving the nervous system (R29.898)                Time: 1641-1700 OT Time Calculation (min): 19 min Charges:  OT General Charges $OT Visit: 1 Visit OT Evaluation $OT Eval Moderate Complexity: [place], OT Acute Rehabilitation Services Pager [phone] Office [phone]   Raymondo Band 08/24/2020, 6:10 PM

## 2020-08-24 NOTE — Progress Notes (Signed)
Called to see patient with time infiltrated IV  Cleviprex was going in the line at the time  Right upper extremity swollen, blisters  -Line was taken out -Will elevate -Cool compress

## 2020-08-24 NOTE — Anesthesia Postprocedure Evaluation (Signed)
Anesthesia Post Note  Patient: Amy Bruce  Procedure(s) Performed: RIGHT CRANIOTOMY HEMATOMA EVACUATION SUBDURAL (Right )     Patient location during evaluation: SICU Anesthesia Type: General Level of consciousness: sedated Pain management: pain level controlled Vital Signs Assessment: post-procedure vital signs reviewed and stable Respiratory status: patient remains intubated per anesthesia plan Cardiovascular status: stable Postop Assessment: no apparent nausea or vomiting Anesthetic complications: no   No complications documented.  Last Vitals:  Vitals:   08/24/20 1100 08/24/20 1145  BP: (!) 188/65 (!) 184/104  Pulse: (!) 50 85  Resp: 17 18  Temp:  (!) 36.4 C  SpO2: 100% 100%    Last Pain:  Vitals:   08/24/20 1145  TempSrc: Oral  PainSc:                  Amy Bruce

## 2020-08-24 NOTE — Progress Notes (Signed)
  NEUROSURGERY PROGRESS NOTE   No issues overnight.   EXAM:  BP (!) 162/58   Pulse 74   Temp 99 F (37.2 C) (Axillary)   Resp 18   Ht 5' (1.524 m)   Wt 76.7 kg   SpO2 100%   BMI 33.02 kg/m   Intubated but awake PERRL Agitated. MAE spontaneously, seemingly nonfocal Needed restraints Bandage in place, minimal dried blood Drain in place 315cc 24 hours  IMPRESSION/PLAN 69 y.o. female POD1 craniotomy for evacuation of SDH. Extubated this am. Seems to be doing well.  - continue supportive care - continue drain for now - PT/OT

## 2020-08-24 NOTE — Plan of Care (Signed)
Pt extubated today. SpO2 100% on 4L [place].

## 2020-08-24 NOTE — Progress Notes (Deleted)
SLP Cancellation Note  Patient Details Name: Amy Bruce MRN: [id number] DOB: Jan 16, 1951   Cancelled treatment:        Pt known to [facility] services this admission and seeing for dysphagia. Discharged from services due to intubation and extubated this morning after 8 days. Walked past room and pt was agitated and will receive meds to calm her. ST will check back tomorrow.    Houston Siren 08/24/2020, 1:27 PM  Orbie Pyo Colvin Caroli.Ed Risk analyst [phone] Office [phone]

## 2020-08-24 NOTE — Procedures (Signed)
Extubation Procedure Note  Patient Details:   Name: Amy Bruce DOB: March 20, 1951 MRN: [id number]   Airway Documentation:    Vent end date: 08/24/20 Vent end time: 0806   Evaluation  O2 sats: stable throughout Complications: No apparent complications Patient did tolerate procedure well. Bilateral Breath Sounds: Clear,Diminished   Yes   No Cuff leak noted. MD notified and approved extubation.  Patient placed on [place] 4L with humidity, no stridor noted.   Bayard Beaver 08/24/2020, 8:31 AM

## 2020-08-24 NOTE — Evaluation (Signed)
Physical Therapy Evaluation Patient Details Name: Amy Bruce MRN: [id number] DOB: [birth year] Today's Date: 08/24/2020   History of Present Illness  69 yo female admitted to ED On 12/14 after fall at doctor's office. CT demonstrated large right frontoparietal SDH with ~23mm R>L MLS and effacement of the right lateral ventricle, and early transtentorial uncal herniation; s/p R frontoparietal craniotomy for evacuation of SDH. PMH includes cervical fusion 04/2020, tobacco abuse, HTN, PAD, anxiety, fibromyalgia, ovarian cancer, pre-DM.  Clinical Impression   Pt presents with lethargy, increased tone LUE/LLE, body-wide weakness, and decreased activity tolerance. Pt to benefit from acute PT to address deficits. Pt tolerated repositioning and pull to sit with total assist only today, pt limited by lethargy. VSS during session. PT to progress mobility as tolerated, and will continue to follow acutely.      Follow Up Recommendations Supervision/Assistance - 24 hour;CIR (pending progress and family support)    Equipment Recommendations  Other (comment) (TBD, need to speak with family)    Recommendations for Other Services       Precautions / Restrictions Precautions Precautions: Fall Precaution Comments: restraints, agitation, JP drain from crani Restrictions Weight Bearing Restrictions: No      Mobility  Bed Mobility Overal bed mobility: Needs Assistance Bed Mobility: Supine to Sit                Transfers                 General transfer comment: pull to sit requiring total assist with complete truncal support, pt resisting motion and returning to supine and R lateral leaning.  Ambulation/Gait                Stairs            Wheelchair Mobility    Modified Rankin (Stroke Patients Only) Modified Rankin (Stroke Patients Only) Pre-Morbid Rankin Score: No significant disability Modified Rankin: Severe disability     Balance                                              Pertinent Vitals/Pain Pain Assessment: Faces Faces Pain Scale: Hurts little more Pain Location: generalized with limb movement Pain Descriptors / Indicators: Grimacing;Guarding Pain Intervention(s): Limited activity within patient's tolerance;Monitored during session;Repositioned    Home Living Family/patient expects to be discharged to:: Private residence Living Arrangements: Alone               Additional Comments: unsure of PLOF or PMH, pt not conversant on eval.    Prior Function           Comments: unsure of PLOF     Hand Dominance        Extremity/Trunk Assessment   Upper Extremity Assessment Upper Extremity Assessment: Defer to OT evaluation    Lower Extremity Assessment Lower Extremity Assessment: Difficult to assess due to impaired cognition;RLE deficits/detail;LLE deficits/detail RLE Deficits / Details: full AROM appreciated LLE Deficits / Details: demonstrating hypertonicity in hip and knee flexion, hip abd       Communication      Cognition Arousal/Alertness: Lethargic Behavior During Therapy: Flat affect Overall Cognitive Status: Difficult to assess                                 General Comments: follows x1 command  during session "move your foot". Pt with waking periods ~10 seconds at a time, requires sternal rub, loud talking, and pulling trunk to sit to wake.      General Comments      Exercises     Assessment/Plan    PT Assessment Patient needs continued PT services  PT Problem List Decreased mobility;Decreased strength;Decreased range of motion;Decreased activity tolerance;Decreased balance;Decreased knowledge of use of DME;Pain;Decreased cognition;Cardiopulmonary status limiting activity;Decreased safety awareness;Impaired tone       PT Treatment Interventions DME instruction;Therapeutic activities;Gait training;Therapeutic exercise;Patient/family education;Balance  training;Functional mobility training;Neuromuscular re-education    PT Goals (Current goals can be found in the Care Plan section)  Acute Rehab PT Goals PT Goal Formulation: Patient unable to participate in goal setting Time For Goal Achievement: 09/07/20 Potential to Achieve Goals: Fair    Frequency Min 3X/week   Barriers to discharge        Co-evaluation PT/OT/SLP Co-Evaluation/Treatment: Yes Reason for Co-Treatment: For patient/therapist safety;To address functional/ADL transfers;Necessary to address cognition/behavior during functional activity PT goals addressed during session: Mobility/safety with mobility;Strengthening/ROM         AM-PAC PT "6 Clicks" Mobility  Outcome Measure Help needed turning from your back to your side while in a flat bed without using bedrails?: Total Help needed moving from lying on your back to sitting on the side of a flat bed without using bedrails?: Total Help needed moving to and from a bed to a chair (including a wheelchair)?: Total Help needed standing up from a chair using your arms (e.g., wheelchair or bedside chair)?: Total Help needed to walk in hospital room?: Total Help needed climbing 3-5 steps with a railing? : Total 6 Click Score: 6    End of Session Equipment Utilized During Treatment: Oxygen Activity Tolerance: Patient limited by lethargy Patient left: in bed;with call bell/phone within reach;with bed alarm set;with restraints reapplied;with SCD's reapplied Nurse Communication: Mobility status PT Visit Diagnosis: Other abnormalities of gait and mobility (R26.89);Other symptoms and signs involving the nervous system (R29.898);History of falling (Z91.81)    Time: 5790-3833 PT Time Calculation (min) (ACUTE ONLY): 20 min   Charges:   PT Evaluation $PT Eval Low Complexity: 1 Low         Arren Laminack S, PT Acute Rehabilitation Services Pager [phone]  Office [phone]   Louis Matte 08/24/2020, 5:46 PM

## 2020-08-25 LAB — CBC
HCT: 34.2 % — ABNORMAL LOW (ref 36.0–46.0)
Hemoglobin: 11.1 g/dL — ABNORMAL LOW (ref 12.0–15.0)
MCH: 28.3 pg (ref 26.0–34.0)
MCHC: 32.5 g/dL (ref 30.0–36.0)
MCV: 87.2 fL (ref 80.0–100.0)
Platelets: 243 10*3/uL (ref 150–400)
RBC: 3.92 MIL/uL (ref 3.87–5.11)
RDW: 16.7 % — ABNORMAL HIGH (ref 11.5–15.5)
WBC: 9.4 10*3/uL (ref 4.0–10.5)
nRBC: 0 % (ref 0.0–0.2)

## 2020-08-25 LAB — BASIC METABOLIC PANEL
Anion gap: 11 (ref 5–15)
BUN: 6 mg/dL — ABNORMAL LOW (ref 8–23)
CO2: 24 mmol/L (ref 22–32)
Calcium: 8.9 mg/dL (ref 8.9–10.3)
Chloride: 105 mmol/L (ref 98–111)
Creatinine, Ser: 0.64 mg/dL (ref 0.44–1.00)
GFR, Estimated: >60 mL/min (ref >60)
Glucose, Bld: 129 mg/dL — ABNORMAL HIGH (ref 70–99)
Potassium: 3.5 mmol/L (ref 3.5–5.1)
Sodium: 140 mmol/L (ref 135–145)

## 2020-08-25 LAB — PHOSPHORUS: Phosphorus: 3.1 mg/dL (ref 2.5–4.6)

## 2020-08-25 LAB — MAGNESIUM: Magnesium: 2.2 mg/dL (ref 1.7–2.4)

## 2020-08-25 NOTE — TOC CAGE-AID Note (Signed)
Transition of Care [facility]) - CAGE-AID Screening   Patient Details  Name: Amy Bruce MRN: [id number] Date of Birth: 1951/01/11  [facility] CM/SW Contact:    Ella Bodo, RN Phone Number: 08/25/2020, 5:21 PM   Clinical Narrative:69 y.o. female POD#2 s/p right craniotomy for SDH.    CAGE-AID Screening: Substance Abuse Screening unable to be completed due to: : Patient unable to participate (Altered mental status)        Reinaldo Raddle, RN, BSN  Trauma/Neuro ICU Case Manager [phone]

## 2020-08-25 NOTE — Progress Notes (Signed)
NAME:  Amy Bruce, MRN:  [id number], DOB:  03/31/1951, LOS: 2 ADMISSION DATE:  08/23/2020, CONSULTATION DATE:  12/14 REFERRING MD:  Dr.Nundkumar, CHIEF COMPLAINT:  Fall   Brief History   Amy Bruce is a 69 yo F w/ PAD on DAPT admit after fall with trauma to head on metal pipe. Found to be altered with acute large subdural hemorrhage. Underwent evacuation in OR today and admit to neuro ICU.  History of present illness   Amy Bruce is a 69 yo F w/ chronic back pain, HLD, tobacco use, PAD on aspirin, plavix presenting after fall. She is intubated currently. History obtained via chart review.   Amy Bruce was in her usual state of health until earlier today when she was coming out of her cardiologist's office when she slipped and fell on a metal pipe. She hit her head without loss of consciousness and was urgently brought to [facility] for evaluation. She began to be more confused, somnolent in the ED and was found to have subdural hemorrhage with midline shift on CT head. She was intubated and neurosurgery emergently brought her to OR for evacuation with Dr.Nundkumar.  Past Medical History  PVD, PAD, HLD, HTN, Chronic pain, GERD  Significant Hospital Events   08/23/20 Present to ED  Consults:  Neurosurg  Procedures:  08/23/20 R craniotomy and drain placement  Significant Diagnostic Tests:  08/23/20 CT head IMPRESSION: Acute right cerebral convexity greater than falcine subdural hematoma. 12 mm leftward midline shift. No definite ventricle trapping at this time.  Micro Data:  12/14 COVID/flu neg  Antimicrobials:  12/14 Ancef for surgical prophylaxis  Interim history/subjective:  O/N events: None  Amy Bruce was examined and evaluated at bedside this pm. Noted to be well sedated. Bedside nursing staff mentions recent dilaudid dose due to agitation.  Objective   Blood pressure (!) 152/68, pulse 74, temperature 98.2 F (36.8 C), temperature source Axillary, resp. rate 14, height 5'  (1.524 m), weight 76.7 kg, SpO2 98 %.        Intake/Output Summary (Last 24 hours) at 08/25/2020 4818 Last data filed at 08/25/2020 0600 Gross per 24 hour  Intake 2457.18 ml  Output 2655 ml  Net -197.82 ml   Filed Weights   08/23/20 1500  Weight: 76.7 kg    Examination:  Gen: Well-developed, ill-appearing HEENT: Craniotomy site covered with bandaging, constricted pupils Pulm: CTAB, no rales, no wheeze CV: RRR, S1s2 wnl, no m/r/g Extm: ROM intact, No peripheral edema Skin: Dry, Warm  Neuro: [facility]  Resolved Hospital Problem list     Assessment & Plan:   #Acute encephalpathy 2/2 large acute subdural hematoma S/p intraoperative evacuation. JP drain intact. Encephalopathy appear to be improving. Agitated, requiring precedex. Hemoglobin stable 11.6-> 9.2-> 10.8->11.1 - Appreciate neuro surg recs - Wean off sedation as tolerated - hydralazine/labetalol prn, keep systolic bp <563  #Acute hypoxic respiratory failure Extubated yesterday with no acute complications. Satting 99 on 4L this am. No fevers, productive sputum, leukocytosis to suggest aspiration in setting of encephalopathy at the moment. - O2 prn to keep sat >88  #PAD On clopidogrel and aspirin at home. Received 2 units platelets in OR. Platelet count at 250. - Hold home anti-platelet therapy - Trend cbc - Monitor drain output for bleed  Best practice (evaluated daily)   Diet: TF Pain/Anxiety/Delirium protocol (if indicated): dilaudid, precedex VAP protocol (if indicated): Y DVT prophylaxis: SCD GI prophylaxis: PPI Glucose control: SSI Mobility: BR last date of multidisciplinary goals of care discussion 08/25/20 Family and staff  present Y Summary of discussion Full scope Follow up goals of care discussion due 08/26/20 Code Status: Full Disposition: ICU  Labs   CBC: Recent Labs  Lab 08/23/20 1120 08/23/20 1340 08/23/20 1603 08/24/20 0426 08/24/20 0505 08/25/20 0643  WBC 7.8  --   --   10.8*  --  9.4  NEUTROABS 5.3  --   --   --   --   --   HGB 11.6* 9.2* 9.9* 10.8* 10.5* 11.1*  HCT 34.6* 27.0* 29.0* 30.9* 31.0* 34.2*  MCV 87.6  --   --  85.1  --  87.2  PLT 189  --   --  250  --  151    Basic Metabolic Panel: Recent Labs  Lab 08/23/20 1120 08/23/20 1340 08/23/20 1603 08/24/20 0426 08/24/20 0505  NA 137 140 142 143 143  K 3.2* 4.0 3.6 3.6 3.3*  CL 103  --   --  107  --   CO2 25  --   --  26  --   GLUCOSE 169*  --   --  126*  --   BUN 10  --   --  7*  --   CREATININE 0.94  --   --  0.77  --   CALCIUM 8.9  --   --  8.6*  --   MG  --   --   --  1.7  --   PHOS  --   --   --  2.8  --    GFR: Estimated Creatinine Clearance: 59.9 mL/min (by C-G formula based on SCr of 0.77 mg/dL). Recent Labs  Lab 08/23/20 1120 08/24/20 0426 08/25/20 0643  WBC 7.8 10.8* 9.4    Liver Function Tests: Recent Labs  Lab 08/23/20 1120  AST 16  ALT 11  ALKPHOS 65  BILITOT 0.4  PROT 6.1*  ALBUMIN 3.2*   No results for input(s): LIPASE, AMYLASE in the last 168 hours. Recent Labs  Lab 08/23/20 1120  AMMONIA 31    ABG    Component Value Date/Time   PHART 7.415 08/24/2020 0505   PCO2ART 44.6 08/24/2020 0505   PO2ART 131 (H) 08/24/2020 0505   HCO3 28.8 (H) 08/24/2020 0505   TCO2 30 08/24/2020 0505   O2SAT 99.0 08/24/2020 0505     Coagulation Profile: Recent Labs  Lab 08/23/20 1120  INR 0.9    Cardiac Enzymes: No results for input(s): CKTOTAL, CKMB, CKMBINDEX, TROPONINI in the last 168 hours.  HbA1C: Hgb A1c MFr Bld  Date/Time Value Ref Range Status  10/25/2017 08:29 AM 6.0 (H) 4.8 - 5.6 % Final    Comment:    (NOTE) Pre diabetes:          5.7%-6.4% Diabetes:              >6.4% Glycemic control for   <7.0% adults with diabetes     CBG: Recent Labs  Lab 08/23/20 1548  GLUCAP 141*    Review of Systems:   Unable to assess  Past Medical History  She,  has a past medical history of Anemia, Anxiety, Arthritis, Cancer ([place]), Complication of  anesthesia, Fibromyalgia, GERD (gastroesophageal reflux disease), Heart murmur, Hyperlipidemia, Hypertension, Myalgia, Neuropathy, PAD (peripheral artery disease) ([place]), PAD (peripheral artery disease) ([place]), Pre-diabetes, Pyelonephritis (09/10/2019), Tobacco abuse, and Tuberculosis.   Surgical History    Past Surgical History:  Procedure Laterality Date  . ABDOMINAL AORTOGRAM W/LOWER EXTREMITY N/A 04/02/2018   Procedure: ABDOMINAL AORTOGRAM W/LOWER EXTREMITY;  Surgeon: Kathlyn Sacramento  A, MD;  Location: [place] [facility];  Service: Cardiovascular;  Laterality: N/A;  . ABDOMINAL AORTOGRAM W/LOWER EXTREMITY N/A 09/23/2019   Procedure: ABDOMINAL AORTOGRAM W/LOWER EXTREMITY;  Surgeon: Wellington Hampshire, MD;  Location: [place] [facility];  Service: Cardiovascular;  Laterality: N/A;  . ABDOMINAL HYSTERECTOMY  1999  . CERVICAL SPINE SURGERY  [id number],[id number]   C5/6 6/7  . COLONOSCOPY    . CRANIOTOMY Right 08/23/2020   Procedure: RIGHT CRANIOTOMY HEMATOMA EVACUATION SUBDURAL;  Surgeon: Consuella Lose, MD;  Location: [place];  Service: Neurosurgery;  Laterality: Right;  . ECTOPIC PREGNANCY SURGERY    . Left axillary cyst removal 1989    . MULTIPLE EXTRACTIONS WITH ALVEOLOPLASTY N/A 10/25/2017   Procedure: EXTRACTION NUMBERS FIVE, SEVEN, EIGHT, NINE, TEN, FIFTEEN, EIGHTEEN WITH ALVEOLOPLASTY, IRRIGATION AND DEBRIDEMENT LEFT SUBMANDIBULAR INFECTION;  Surgeon: Diona Browner, DDS;  Location: [place];  Service: Oral Surgery;  Laterality: N/A;  . PERIPHERAL VASCULAR ATHERECTOMY Right 09/23/2019   Procedure: PERIPHERAL VASCULAR ATHERECTOMY;  Surgeon: Wellington Hampshire, MD;  Location: [place] [facility];  Service: Cardiovascular;  Laterality: Right;  SFA  . PERIPHERAL VASCULAR BALLOON ANGIOPLASTY Right 09/23/2019   Procedure: PERIPHERAL VASCULAR BALLOON ANGIOPLASTY;  Surgeon: Wellington Hampshire, MD;  Location: [place] [facility];  Service: Cardiovascular;  Laterality: Right;  SFA  . RADIOLOGY WITH ANESTHESIA N/A  11/18/2018   Procedure: MRI OF THE CERVICAL SPINE WITHOUT CONTRAST;  Surgeon: Radiologist, Medication, MD;  Location: [place];  Service: Radiology;  Laterality: N/A;  . RADIOLOGY WITH ANESTHESIA N/A 12/03/2019   Procedure: MRI WITH ANESTHESIA CERVICAL WITH AND WITHOUT CONTRAST;  Surgeon: Radiologist, Medication, MD;  Location: [place];  Service: Radiology;  Laterality: N/A;  . TONSILECTOMY, ADENOIDECTOMY, BILATERAL MYRINGOTOMY AND TUBES  1981   put not aware of tubes being put in ears  . TUBAL LIGATION  1981     Social History   reports that she has been smoking cigarettes. She has a 33.00 pack-year smoking history. She has never used smokeless tobacco. She reports current alcohol use of about 2.0 standard drinks of alcohol per week. She reports that she does not use drugs.   Family History   Her family history includes Ovarian cancer in her mother.   Allergies Allergies  Allergen Reactions  . Carisoprodol Swelling  . Duloxetine Hcl Swelling and Hypertension     Home Medications  Prior to Admission medications   Medication Sig Start Date End Date Taking? Authorizing Provider  albuterol (VENTOLIN HFA) 108 (90 Base) MCG/ACT inhaler INHALE 1 TO 2 PUFFS INTO THE LUNGS EVERY 4 HOURS AS NEEDED FOR WHEEZING 06/14/20   Magdalen Spatz, NP  amitriptyline (ELAVIL) 50 MG tablet TAKE 1 TABLET(50 MG) BY MOUTH AT BEDTIME 07/12/20   Jamse Arn, MD  aspirin EC 81 MG tablet Take 81 mg by mouth daily.    [provider]  atorvastatin (LIPITOR) 80 MG tablet Take 80 mg by mouth daily. 04/06/20   [provider]  calcium carbonate (TUMS - DOSED IN MG ELEMENTAL CALCIUM) 500 MG chewable tablet Chew 2 tablets by mouth 2 (two) times daily as needed for indigestion or heartburn.     [provider]  Camphor-Eucalyptus-Menthol (VICKS VAPORUB EX) Apply 1 application topically daily as needed (congestion).    [provider]  clopidogrel (PLAVIX) 75 MG tablet Take 1 tablet (75 mg  total) by mouth daily. 08/23/20 08/23/21  Wellington Hampshire, MD  cyclobenzaprine (FLEXERIL) 10 MG tablet Take 1 tablet (10 mg  total) by mouth 3 (three) times daily as needed for muscle spasms. Patient taking differently: Take 10 mg by mouth 3 (three) times daily. 04/08/19   Jamse Arn, MD  diazepam (VALIUM) 5 MG tablet Take 5 mg by mouth as directed. 05/18/20   [provider]  fexofenadine (ALLEGRA) 180 MG tablet Take 180 mg by mouth daily.    [provider]  lidocaine (LIDODERM) 5 % UNWRAP AND APPLY 1 PATCH TO SKIN DAILY. REMOVE AND DISCARD PATCH WITHIN 12 HOURS OR AS DIRECTED BY MD. Patient taking differently: Place 1 patch onto the skin daily as needed (pain.). UNWRAP AND APPLY 1 PATCH TO SKIN DAILY. REMOVE AND DISCARD PATCH WITHIN 12 HOURS OR AS DIRECTED BY MD. 01/23/18   Jamse Arn, MD  nicotine (NICODERM CQ - DOSED IN MG/24 HOURS) 21 mg/24hr patch Place 21 mg onto the skin daily as needed (smoking cessation).    [provider]  nicotine polacrilex (COMMIT) 2 MG lozenge Take 2 mg by mouth as needed for smoking cessation (Max 20 lozenges per day).     [provider]  Omega-3 Fatty Acids (FISH OIL) 1000 MG CAPS Take 1 capsule by mouth daily.    [provider]  oxyCODONE-acetaminophen (PERCOCET) 7.5-325 MG tablet Take 1 tablet by mouth every 4 (four) hours as needed for severe pain.    [provider]  pantoprazole (PROTONIX) 40 MG tablet SMARTSIG:1 Tablet(s) By Mouth Every Evening 04/30/20   [provider]  pregabalin (LYRICA) 200 MG capsule TAKE 1 CAPSULE BY MOUTH EVERY MORNING, 1 CAPSULE AT NOON, AND 1 CAPSULE AT BEDTIME 05/23/20   Patel, Domenick Bookbinder, MD  tiZANidine (ZANAFLEX) 4 MG tablet TAKE 1/2 TABLET(2 MG) BY MOUTH THREE TIMES DAILY 04/08/20   Jamse Arn, MD  vitamin B-12 (CYANOCOBALAMIN) 1000 MCG tablet Take 1,000 mcg by mouth daily.    [provider]   Mosetta Anis, MD 08/25/2020, 7:02 AM PGY-3,  [place] [facility] Pager: [phone]  Critical care time:

## 2020-08-25 NOTE — Progress Notes (Signed)
  NEUROSURGERY PROGRESS NOTE   No issues overnight. Extubated yesterday  EXAM:  BP 119/73   Pulse 62   Temp 99.8 F (37.7 C) (Axillary)   Resp 10   Ht 5' (1.524 m)   Wt 76.7 kg   SpO2 100%   BMI 33.02 kg/m   Drowsy but arouses to voice Opens eyes to voice Pupils reactive Does not respond to questions Moves all extremities spontaneously, not FC Wound c/d/i JP with primarily serous/CSF fluid  IMPRESSION:  69 y.o. female POD#2 s/p right craniotomy for SDH. Remains encephalopathic. CTH yesterday with good evacuation, significant improvement in mass effect. BMP normal today, no fever, normal WBC.  PLAN: - Can d/c JP drain today - Cont to keep off precedex, limit narcotics for accurate neurologic exam - May consider EEG if she remains obtunded   Consuella Lose, MD [facility]

## 2020-08-25 NOTE — Progress Notes (Signed)
SLP Cancellation Note  Patient Details Name: Amy Bruce MRN: [id number] DOB: 01-24-1951   Cancelled treatment:        Pt obtunded today. Will continue efforts for swallow and cog-speech-lang eval   Houston Siren 08/25/2020, 12:12 PM

## 2020-08-26 ENCOUNTER — Inpatient Hospital Stay (HOSPITAL_COMMUNITY): Payer: Medicare Other

## 2020-08-26 DIAGNOSIS — S065X9A Traumatic subdural hemorrhage with loss of consciousness of unspecified duration, initial encounter: Secondary | ICD-10-CM

## 2020-08-26 DIAGNOSIS — I10 Essential (primary) hypertension: Secondary | ICD-10-CM

## 2020-08-26 LAB — GLUCOSE, CAPILLARY
Glucose-Capillary: 106 mg/dL — ABNORMAL HIGH (ref 70–99)
Glucose-Capillary: 92 mg/dL (ref 70–99)

## 2020-08-26 MED ORDER — PROSOURCE TF PO LIQD
45.0000 mL | Freq: Every day | ORAL | Status: DC
  Administered 2020-08-26 – 2020-08-31 (×6): 45 mL
  Filled 2020-08-26 (×6): qty 45

## 2020-08-26 MED ORDER — AMITRIPTYLINE HCL 25 MG PO TABS
50.0000 mg | ORAL_TABLET | Freq: Every day | ORAL | Status: DC
  Administered 2020-08-26 – 2020-08-31 (×6): 50 mg
  Filled 2020-08-26 (×6): qty 2

## 2020-08-26 MED ORDER — CHLORHEXIDINE GLUCONATE 0.12 % MT SOLN
OROMUCOSAL | Status: AC
  Filled 2020-08-26: qty 15

## 2020-08-26 MED ORDER — SENNA 8.6 MG PO TABS
1.0000 | ORAL_TABLET | Freq: Two times a day (BID) | ORAL | Status: DC
  Administered 2020-08-26 – 2020-08-31 (×10): 8.6 mg
  Filled 2020-08-26 (×11): qty 1

## 2020-08-26 MED ORDER — PANTOPRAZOLE SODIUM 40 MG PO PACK
40.0000 mg | PACK | Freq: Every day | ORAL | Status: DC
  Administered 2020-08-26 – 2020-08-31 (×6): 40 mg
  Filled 2020-08-26 (×6): qty 20

## 2020-08-26 MED ORDER — OSMOLITE 1.2 CAL PO LIQD
1000.0000 mL | ORAL | Status: AC
  Administered 2020-08-26 – 2020-08-30 (×6): 1000 mL
  Filled 2020-08-26 (×2): qty 1000

## 2020-08-26 MED ORDER — HYDRALAZINE HCL 20 MG/ML IJ SOLN
20.0000 mg | INTRAMUSCULAR | Status: DC | PRN
  Administered 2020-08-27 – 2020-08-28 (×6): 20 mg via INTRAVENOUS
  Filled 2020-08-26 (×6): qty 1

## 2020-08-26 MED ORDER — ATORVASTATIN CALCIUM 80 MG PO TABS
80.0000 mg | ORAL_TABLET | Freq: Every day | ORAL | Status: DC
  Administered 2020-08-26 – 2020-09-01 (×7): 80 mg
  Filled 2020-08-26 (×7): qty 1

## 2020-08-26 MED ORDER — FENTANYL 12 MCG/HR TD PT72
1.0000 | MEDICATED_PATCH | TRANSDERMAL | Status: DC
  Administered 2020-08-26: 12:00:00 1 via TRANSDERMAL
  Filled 2020-08-26: qty 1

## 2020-08-26 NOTE — Progress Notes (Deleted)
NAME:  Amy Bruce, MRN:  [id number], DOB:  1951-01-14, LOS: 3 ADMISSION DATE:  08/23/2020, CONSULTATION DATE:  12/14 REFERRING MD:  Dr.Nundkumar, CHIEF COMPLAINT:  Fall   Brief History   Amy Bruce is a 69 yo F w/ PAD on DAPT admit after fall with trauma to head on metal pipe. Found to be altered with acute large subdural hemorrhage. Underwent evacuation in OR today and admit to neuro ICU.  History of present illness   Amy Bruce is a 69 yo F w/ chronic back pain, HLD, tobacco use, PAD on aspirin, plavix presenting after fall. She is intubated currently. History obtained via chart review.   Amy Bruce was in her usual state of health until earlier today when she was coming out of her cardiologist's office when she slipped and fell on a metal pipe. She hit her head without loss of consciousness and was urgently brought to [facility] for evaluation. She began to be more confused, somnolent in the ED and was found to have subdural hemorrhage with midline shift on CT head. She was intubated and neurosurgery emergently brought her to OR for evacuation with Dr.Nundkumar.  Past Medical History  PVD, PAD, HLD, HTN, Chronic pain, GERD  Significant Hospital Events   08/23/20 Present to ED  Consults:  Neurosurg  Procedures:  08/23/20 R craniotomy and drain placement  Significant Diagnostic Tests:  08/23/20 CT head IMPRESSION: Acute right cerebral convexity greater than falcine subdural hematoma. 12 mm leftward midline shift. No definite ventricle trapping at this time.  Micro Data:  12/14 COVID/flu neg  Antimicrobials:  12/14 Ancef for surgical prophylaxis  Interim history/subjective:  O/N events: None  Amy Bruce was examined and evaluated at bedside this pm. Noted to be well sedated. Bedside nursing staff mentions yesterday attempting wean off successfully. Daughter updated on clinical course at bedside.  Objective   Blood pressure 118/63, pulse 81, temperature 99.8 F (37.7 C),  temperature source Oral, resp. rate 14, height 5' (1.524 m), weight 76.7 kg, SpO2 100 %.        Intake/Output Summary (Last 24 hours) at 08/26/2020 0710 Last data filed at 08/26/2020 0700 Gross per 24 hour  Intake 2195.71 ml  Output 690 ml  Net 1505.71 ml   Filed Weights   08/23/20 1500  Weight: 76.7 kg    Examination: Gen: Well-developed, chronically ill-appearing HEENT: Craniotomy site covered with bandaging, drain with bloody output Pulm: CTAB, snoring Extm: ROM intact, No peripheral edema Skin: Dry, Warm, normal turgor.  Neuro: Withdraw to pain    Resolved Hospital Problem list     Assessment & Plan:   #Acute encephalpathy 2/2 large acute subdural hematoma S/p intraoperative evacuation. JP drain intact. Encephalopathy appear to be improving. Agitated, requiring precedex. Hemoglobin stable 11.6-> 9.2-> 10.8->11.1. Tried to wean off sedation yesterday unsuccessfully - Cortrak placement. Can trial oral sedation to wean off precedex - Appreciate neuro surg recs - Wean off sedation as tolerated - hydralazine/labetalol prn, keep systolic bp <174  #Acute hypoxic respiratory failure Extubated yesterday with no acute complications. Satting 99 on 4L this am. No fevers, productive sputum, leukocytosis to suggest aspiration in setting of encephalopathy at the moment. - Resolving - O2 prn to keep sat >88  #PAD On clopidogrel and aspirin at home. Received 2 units platelets in OR. Platelet count at 250. - Hold home anti-platelet therapy - Trend cbc - Monitor drain output for bleed  Best practice (evaluated daily)   Diet: TF Pain/Anxiety/Delirium protocol (if indicated): dilaudid, precedex VAP protocol (if indicated):  Y DVT prophylaxis: SCD GI prophylaxis: PPI Glucose control: SSI Mobility: BR last date of multidisciplinary goals of care discussion 08/25/20 Family and staff present Y Summary of discussion Full scope Follow up goals of care discussion due 08/26/20 Code  Status: Full Disposition: ICU  Labs   CBC: Recent Labs  Lab 08/23/20 1120 08/23/20 1340 08/23/20 1603 08/24/20 0426 08/24/20 0505 08/25/20 0643  WBC 7.8  --   --  10.8*  --  9.4  NEUTROABS 5.3  --   --   --   --   --   HGB 11.6* 9.2* 9.9* 10.8* 10.5* 11.1*  HCT 34.6* 27.0* 29.0* 30.9* 31.0* 34.2*  MCV 87.6  --   --  85.1  --  87.2  PLT 189  --   --  250  --  952    Basic Metabolic Panel: Recent Labs  Lab 08/23/20 1120 08/23/20 1340 08/23/20 1603 08/24/20 0426 08/24/20 0505 08/25/20 0643  NA 137 140 142 143 143 140  K 3.2* 4.0 3.6 3.6 3.3* 3.5  CL 103  --   --  107  --  105  CO2 25  --   --  26  --  24  GLUCOSE 169*  --   --  126*  --  129*  BUN 10  --   --  7*  --  6*  CREATININE 0.94  --   --  0.77  --  0.64  CALCIUM 8.9  --   --  8.6*  --  8.9  MG  --   --   --  1.7  --  2.2  PHOS  --   --   --  2.8  --  3.1   GFR: Estimated Creatinine Clearance: 59.9 mL/min (by C-G formula based on SCr of 0.64 mg/dL). Recent Labs  Lab 08/23/20 1120 08/24/20 0426 08/25/20 0643  WBC 7.8 10.8* 9.4    Liver Function Tests: Recent Labs  Lab 08/23/20 1120  AST 16  ALT 11  ALKPHOS 65  BILITOT 0.4  PROT 6.1*  ALBUMIN 3.2*   No results for input(s): LIPASE, AMYLASE in the last 168 hours. Recent Labs  Lab 08/23/20 1120  AMMONIA 31    ABG    Component Value Date/Time   PHART 7.415 08/24/2020 0505   PCO2ART 44.6 08/24/2020 0505   PO2ART 131 (H) 08/24/2020 0505   HCO3 28.8 (H) 08/24/2020 0505   TCO2 30 08/24/2020 0505   O2SAT 99.0 08/24/2020 0505     Coagulation Profile: Recent Labs  Lab 08/23/20 1120  INR 0.9    Cardiac Enzymes: No results for input(s): CKTOTAL, CKMB, CKMBINDEX, TROPONINI in the last 168 hours.  HbA1C: Hgb A1c MFr Bld  Date/Time Value Ref Range Status  10/25/2017 08:29 AM 6.0 (H) 4.8 - 5.6 % Final    Comment:    (NOTE) Pre diabetes:          5.7%-6.4% Diabetes:              >6.4% Glycemic control for   <7.0% adults with  diabetes     CBG: Recent Labs  Lab 08/23/20 1548  GLUCAP 141*    Review of Systems:   Unable to assess  Past Medical History  She,  has a past medical history of Anemia, Anxiety, Arthritis, Cancer ([place]), Complication of anesthesia, Fibromyalgia, GERD (gastroesophageal reflux disease), Heart murmur, Hyperlipidemia, Hypertension, Myalgia, Neuropathy, PAD (peripheral artery disease) ([place]), PAD (peripheral artery disease) ([place]), Pre-diabetes, Pyelonephritis (09/10/2019), Tobacco  abuse, and Tuberculosis.   Surgical History    Past Surgical History:  Procedure Laterality Date  . ABDOMINAL AORTOGRAM W/LOWER EXTREMITY N/A 04/02/2018   Procedure: ABDOMINAL AORTOGRAM W/LOWER EXTREMITY;  Surgeon: Wellington Hampshire, MD;  Location: [place] [facility];  Service: Cardiovascular;  Laterality: N/A;  . ABDOMINAL AORTOGRAM W/LOWER EXTREMITY N/A 09/23/2019   Procedure: ABDOMINAL AORTOGRAM W/LOWER EXTREMITY;  Surgeon: Wellington Hampshire, MD;  Location: [place] [facility];  Service: Cardiovascular;  Laterality: N/A;  . ABDOMINAL HYSTERECTOMY  1999  . CERVICAL SPINE SURGERY  9937,1696   C5/6 6/7  . COLONOSCOPY    . CRANIOTOMY Right 08/23/2020   Procedure: RIGHT CRANIOTOMY HEMATOMA EVACUATION SUBDURAL;  Surgeon: Consuella Lose, MD;  Location: [place];  Service: Neurosurgery;  Laterality: Right;  . ECTOPIC PREGNANCY SURGERY    . Left axillary cyst removal 1989    . MULTIPLE EXTRACTIONS WITH ALVEOLOPLASTY N/A 10/25/2017   Procedure: EXTRACTION NUMBERS FIVE, SEVEN, EIGHT, NINE, TEN, FIFTEEN, EIGHTEEN WITH ALVEOLOPLASTY, IRRIGATION AND DEBRIDEMENT LEFT SUBMANDIBULAR INFECTION;  Surgeon: Diona Browner, DDS;  Location: [place];  Service: Oral Surgery;  Laterality: N/A;  . PERIPHERAL VASCULAR ATHERECTOMY Right 09/23/2019   Procedure: PERIPHERAL VASCULAR ATHERECTOMY;  Surgeon: Wellington Hampshire, MD;  Location: [place] [facility];  Service: Cardiovascular;  Laterality: Right;  SFA  . PERIPHERAL VASCULAR BALLOON  ANGIOPLASTY Right 09/23/2019   Procedure: PERIPHERAL VASCULAR BALLOON ANGIOPLASTY;  Surgeon: Wellington Hampshire, MD;  Location: [place] [facility];  Service: Cardiovascular;  Laterality: Right;  SFA  . RADIOLOGY WITH ANESTHESIA N/A 11/18/2018   Procedure: MRI OF THE CERVICAL SPINE WITHOUT CONTRAST;  Surgeon: Radiologist, Medication, MD;  Location: [place];  Service: Radiology;  Laterality: N/A;  . RADIOLOGY WITH ANESTHESIA N/A 12/03/2019   Procedure: MRI WITH ANESTHESIA CERVICAL WITH AND WITHOUT CONTRAST;  Surgeon: Radiologist, Medication, MD;  Location: [place];  Service: Radiology;  Laterality: N/A;  . TONSILECTOMY, ADENOIDECTOMY, BILATERAL MYRINGOTOMY AND TUBES  1981   put not aware of tubes being put in ears  . TUBAL LIGATION  1981     Social History   reports that she has been smoking cigarettes. She has a 33.00 pack-year smoking history. She has never used smokeless tobacco. She reports current alcohol use of about 2.0 standard drinks of alcohol per week. She reports that she does not use drugs.   Family History   Her family history includes Ovarian cancer in her mother.   Allergies Allergies  Allergen Reactions  . Carisoprodol Swelling  . Duloxetine Hcl Swelling and Hypertension     Home Medications  Prior to Admission medications   Medication Sig Start Date End Date Taking? Authorizing Provider  albuterol (VENTOLIN HFA) 108 (90 Base) MCG/ACT inhaler INHALE 1 TO 2 PUFFS INTO THE LUNGS EVERY 4 HOURS AS NEEDED FOR WHEEZING 06/14/20   Magdalen Spatz, NP  amitriptyline (ELAVIL) 50 MG tablet TAKE 1 TABLET(50 MG) BY MOUTH AT BEDTIME 07/12/20   Jamse Arn, MD  aspirin EC 81 MG tablet Take 81 mg by mouth daily.    [provider]  atorvastatin (LIPITOR) 80 MG tablet Take 80 mg by mouth daily. 04/06/20   [provider]  calcium carbonate (TUMS - DOSED IN MG ELEMENTAL CALCIUM) 500 MG chewable tablet Chew 2 tablets by mouth 2 (two) times daily as needed for indigestion or  heartburn.     [provider]  Camphor-Eucalyptus-Menthol (VICKS VAPORUB EX) Apply 1 application topically daily as needed (congestion).  [provider]  clopidogrel (PLAVIX) 75 MG tablet Take 1 tablet (75 mg total) by mouth daily. 08/23/20 08/23/21  Wellington Hampshire, MD  cyclobenzaprine (FLEXERIL) 10 MG tablet Take 1 tablet (10 mg total) by mouth 3 (three) times daily as needed for muscle spasms. Patient taking differently: Take 10 mg by mouth 3 (three) times daily. 04/08/19   Jamse Arn, MD  diazepam (VALIUM) 5 MG tablet Take 5 mg by mouth as directed. 05/18/20   [provider]  fexofenadine (ALLEGRA) 180 MG tablet Take 180 mg by mouth daily.    [provider]  lidocaine (LIDODERM) 5 % UNWRAP AND APPLY 1 PATCH TO SKIN DAILY. REMOVE AND DISCARD PATCH WITHIN 12 HOURS OR AS DIRECTED BY MD. Patient taking differently: Place 1 patch onto the skin daily as needed (pain.). UNWRAP AND APPLY 1 PATCH TO SKIN DAILY. REMOVE AND DISCARD PATCH WITHIN 12 HOURS OR AS DIRECTED BY MD. 01/23/18   Jamse Arn, MD  nicotine (NICODERM CQ - DOSED IN MG/24 HOURS) 21 mg/24hr patch Place 21 mg onto the skin daily as needed (smoking cessation).    [provider]  nicotine polacrilex (COMMIT) 2 MG lozenge Take 2 mg by mouth as needed for smoking cessation (Max 20 lozenges per day).     [provider]  Omega-3 Fatty Acids (FISH OIL) 1000 MG CAPS Take 1 capsule by mouth daily.    [provider]  oxyCODONE-acetaminophen (PERCOCET) 7.5-325 MG tablet Take 1 tablet by mouth every 4 (four) hours as needed for severe pain.    [provider]  pantoprazole (PROTONIX) 40 MG tablet SMARTSIG:1 Tablet(s) By Mouth Every Evening 04/30/20   [provider]  pregabalin (LYRICA) 200 MG capsule TAKE 1 CAPSULE BY MOUTH EVERY MORNING, 1 CAPSULE AT NOON, AND 1 CAPSULE AT BEDTIME 05/23/20   Patel, Domenick Bookbinder, MD  tiZANidine (ZANAFLEX) 4 MG tablet TAKE  1/2 TABLET(2 MG) BY MOUTH THREE TIMES DAILY 04/08/20   Jamse Arn, MD  vitamin B-12 (CYANOCOBALAMIN) 1000 MCG tablet Take 1,000 mcg by mouth daily.    [provider]   Mosetta Anis, MD 08/26/2020, 7:10 AM PGY-3, [place] Pager: [phone]  Critical care time:

## 2020-08-26 NOTE — Progress Notes (Signed)
Initial Nutrition Assessment  DOCUMENTATION CODES:   Obesity unspecified  INTERVENTION:   Initiate tube feeding via Cortrak: Osmolite 1.2 at 60 ml/h (1440 ml per day) Prosource TF 45 ml daily  Provides 1768 kcal, 90 gm protein, 1167 ml free water daily   NUTRITION DIAGNOSIS:   Inadequate oral intake related to inability to eat as evidenced by NPO status.  GOAL:   Patient will meet greater than or equal to 90% of their needs  MONITOR:   TF tolerance  REASON FOR ASSESSMENT:   Consult Enteral/tube feeding initiation and management  ASSESSMENT:   Pt with PMH of PVD, PAD, HLD, HTN, chronic pain, and GERD admitted after fall with acute large SDH s/p R craniotomy for evacuation.   Pt discussed during ICU rounds and with RN.  Spoke with daughter who is at bedside. Per daughter pt eats 2 meals per day since she wakes later in the day. She lives alone and is able to cook for herself. Pt too asleep to work with SLP today.   12/17 cortrak placement planned   Medications reviewed and include: senna Precedex  Labs reviewed    NUTRITION - FOCUSED PHYSICAL EXAM:  Flowsheet Row Most Recent Value  Orbital Region No depletion  Upper Arm Region No depletion  Thoracic and Lumbar Region No depletion  Buccal Region No depletion  Temple Region No depletion  Clavicle Bone Region No depletion  Clavicle and Acromion Bone Region No depletion  Scapular Bone Region Unable to assess  Dorsal Hand No depletion  Patellar Region No depletion  Anterior Thigh Region No depletion  Posterior Calf Region No depletion  Edema (RD Assessment) None  Hair Reviewed  Eyes Reviewed  Mouth Unable to assess  Skin Reviewed  Nails Reviewed       Diet Order:   Diet Order    None      EDUCATION NEEDS:   No education needs have been identified at this time  Skin:  Skin Assessment: Reviewed RN Assessment  Last BM:  unknown  Height:   Ht Readings from Last 1 Encounters:  08/23/20 5'  (1.524 m)    Weight:   Wt Readings from Last 1 Encounters:  08/23/20 76.7 kg    Ideal Body Weight:  45.4 kg  BMI:  Body mass index is 33.02 kg/m.  Estimated Nutritional Needs:   Kcal:  1700-1900  Protein:  85-100 grams  Fluid:  > 1.7 L/day  Lockie Pares., RD, LDN, CNSC See AMiON for contact information

## 2020-08-26 NOTE — Progress Notes (Signed)
NAME:  Amy Bruce, MRN:  [id number], DOB:  24-Jan-1951, LOS: 3 ADMISSION DATE:  08/23/2020, CONSULTATION DATE:  12/14 REFERRING MD:  Dr.Nundkumar, CHIEF COMPLAINT:  Fall   Brief History   Amy Bruce is a 69 yo F w/ PAD on DAPT admit after fall with trauma to head on metal pipe. Found to be altered with acute large subdural hemorrhage. Underwent evacuation in OR today and admit to neuro ICU.  History of present illness   Amy Bruce is a 69 yo F w/ chronic back pain, HLD, tobacco use, PAD on aspirin, plavix presenting after fall. She is intubated currently. History obtained via chart review.  Amy Bruce was in her usual state of health until earlier today when she was coming out of her cardiologist's office when she slipped and fell on a metal pipe. She hit her head without loss of consciousness and was urgently brought to [facility] for evaluation. She began to be more confused, somnolent in the ED and was found to have subdural hemorrhage with midline shift on CT head. She was intubated and neurosurgery emergently brought her to OR for evacuation with Dr.Nundkumar.  Past Medical History  PVD, PAD, HLD, HTN, Chronic pain, GERD  Significant Hospital Events   08/23/20 Present to ED  Consults:  Neurosurg  Procedures:  08/23/20 R craniotomy and drain placement  Significant Diagnostic Tests:  08/23/20 CT head IMPRESSION: Acute right cerebral convexity greater than falcine subdural hematoma. 12 mm leftward midline shift. No definite ventricle trapping at this time.  Micro Data:  12/14 COVID/flu neg  Antimicrobials:  12/14 Ancef for surgical prophylaxis  Interim history/subjective:  No overnight events Sedated on the dexmedetomidine She awakens and agitated  Objective   Blood pressure 118/63, pulse 81, temperature 99.8 F (37.7 C), temperature source Oral, resp. rate 14, height 5' (1.524 m), weight 76.7 kg, SpO2 100 %.        Intake/Output Summary (Last 24 hours) at 08/26/2020  0816 Last data filed at 08/26/2020 0700 Gross per 24 hour  Intake 2195.71 ml  Output 690 ml  Net 1505.71 ml   Filed Weights   08/23/20 1500  Weight: 76.7 kg    Examination: Gen: Well-developed, ill-appearing HEENT: craniotomy site noted, covered with Bandage Neck: Neck is supple, no adenopathy CV: S1-S2 appreciated  Pulm: Clear breath sounds bilaterally Abd: Bowel sounds appreciated Extm: No edema Skin: Dry, Warm, normal turgor.  Neuro: Agitated, following directions intermittently  Resolved Hospital Problem list     Assessment & Plan:   Acute encephalopathy secondary to large acute subdural hematoma Status post evacuation with drain placement Repeat CT is stable -Neurosurgery continues to follow -Hydralazine/labetalol as needed to keep systolic BP less than 124  Acute hypoxic respiratory failure -Successfully of ventilator for over 48 hours  Agitation -We will continue dexmedetomidine at very low doses  Patient has a history of chronic pain -Pain management  Feeding -Patient will have a feeding tube placed today -This should also help with medication administration  Peripheral arterial disease -Antiplatelets on hold  Uncontrolled hypertension -We will need to adjust medications once NG tube in place -  For feeding tube placement today Discussed with daughter at bedside  Best practice (evaluated daily)   Diet: NPO til S&S Pain/Anxiety/Delirium protocol (if indicated): Dilaudid, Precedex VAP protocol (if indicated): Y DVT prophylaxis: SCD GI prophylaxis: PPI Glucose control: SSI Mobility: BR last date of multidisciplinary goals of care discussion N/A Family and staff present N Summary of discussion N/A Follow up goals of care  discussion due 08/24/20 Code Status: Full Disposition: ICU  Labs   CBC: Recent Labs  Lab 08/23/20 1120 08/23/20 1340 08/23/20 1603 08/24/20 0426 08/24/20 0505 08/25/20 0643  WBC 7.8  --   --  10.8*  --  9.4   NEUTROABS 5.3  --   --   --   --   --   HGB 11.6* 9.2* 9.9* 10.8* 10.5* 11.1*  HCT 34.6* 27.0* 29.0* 30.9* 31.0* 34.2*  MCV 87.6  --   --  85.1  --  87.2  PLT 189  --   --  250  --  423    Basic Metabolic Panel: Recent Labs  Lab 08/23/20 1120 08/23/20 1340 08/23/20 1603 08/24/20 0426 08/24/20 0505 08/25/20 0643  NA 137 140 142 143 143 140  K 3.2* 4.0 3.6 3.6 3.3* 3.5  CL 103  --   --  107  --  105  CO2 25  --   --  26  --  24  GLUCOSE 169*  --   --  126*  --  129*  BUN 10  --   --  7*  --  6*  CREATININE 0.94  --   --  0.77  --  0.64  CALCIUM 8.9  --   --  8.6*  --  8.9  MG  --   --   --  1.7  --  2.2  PHOS  --   --   --  2.8  --  3.1   GFR: Estimated Creatinine Clearance: 59.9 mL/min (by C-G formula based on SCr of 0.64 mg/dL). Recent Labs  Lab 08/23/20 1120 08/24/20 0426 08/25/20 0643  WBC 7.8 10.8* 9.4    Liver Function Tests: Recent Labs  Lab 08/23/20 1120  AST 16  ALT 11  ALKPHOS 65  BILITOT 0.4  PROT 6.1*  ALBUMIN 3.2*   No results for input(s): LIPASE, AMYLASE in the last 168 hours. Recent Labs  Lab 08/23/20 1120  AMMONIA 31    ABG    Component Value Date/Time   PHART 7.415 08/24/2020 0505   PCO2ART 44.6 08/24/2020 0505   PO2ART 131 (H) 08/24/2020 0505   HCO3 28.8 (H) 08/24/2020 0505   TCO2 30 08/24/2020 0505   O2SAT 99.0 08/24/2020 0505     Coagulation Profile: Recent Labs  Lab 08/23/20 1120  INR 0.9    Cardiac Enzymes: No results for input(s): CKTOTAL, CKMB, CKMBINDEX, TROPONINI in the last 168 hours.  HbA1C: Hgb A1c MFr Bld  Date/Time Value Ref Range Status  10/25/2017 08:29 AM 6.0 (H) 4.8 - 5.6 % Final    Comment:    (NOTE) Pre diabetes:          5.7%-6.4% Diabetes:              >6.4% Glycemic control for   <7.0% adults with diabetes     CBG: Recent Labs  Lab 08/23/20 1548  GLUCAP 141*    Review of Systems:   Unable to assess  Past Medical History  She,  has a past medical history of Anemia, Anxiety,  Arthritis, Cancer ([place]), Complication of anesthesia, Fibromyalgia, GERD (gastroesophageal reflux disease), Heart murmur, Hyperlipidemia, Hypertension, Myalgia, Neuropathy, PAD (peripheral artery disease) ([place]), PAD (peripheral artery disease) ([place]), Pre-diabetes, Pyelonephritis (09/10/2019), Tobacco abuse, and Tuberculosis.   Surgical History    Past Surgical History:  Procedure Laterality Date  . ABDOMINAL AORTOGRAM W/LOWER EXTREMITY N/A 04/02/2018   Procedure: ABDOMINAL AORTOGRAM W/LOWER EXTREMITY;  Surgeon: Kathlyn Sacramento  A, MD;  Location: [place] [facility];  Service: Cardiovascular;  Laterality: N/A;  . ABDOMINAL AORTOGRAM W/LOWER EXTREMITY N/A 09/23/2019   Procedure: ABDOMINAL AORTOGRAM W/LOWER EXTREMITY;  Surgeon: Wellington Hampshire, MD;  Location: [place] [facility];  Service: Cardiovascular;  Laterality: N/A;  . ABDOMINAL HYSTERECTOMY  1999  . CERVICAL SPINE SURGERY  [id number]   C5/6 6/7  . COLONOSCOPY    . CRANIOTOMY Right 08/23/2020   Procedure: RIGHT CRANIOTOMY HEMATOMA EVACUATION SUBDURAL;  Surgeon: Consuella Lose, MD;  Location: [place];  Service: Neurosurgery;  Laterality: Right;  . ECTOPIC PREGNANCY SURGERY    . Left axillary cyst removal 1989    . MULTIPLE EXTRACTIONS WITH ALVEOLOPLASTY N/A 10/25/2017   Procedure: EXTRACTION NUMBERS FIVE, SEVEN, EIGHT, NINE, TEN, FIFTEEN, EIGHTEEN WITH ALVEOLOPLASTY, IRRIGATION AND DEBRIDEMENT LEFT SUBMANDIBULAR INFECTION;  Surgeon: Diona Browner, DDS;  Location: [place];  Service: Oral Surgery;  Laterality: N/A;  . PERIPHERAL VASCULAR ATHERECTOMY Right 09/23/2019   Procedure: PERIPHERAL VASCULAR ATHERECTOMY;  Surgeon: Wellington Hampshire, MD;  Location: [place] [facility];  Service: Cardiovascular;  Laterality: Right;  SFA  . PERIPHERAL VASCULAR BALLOON ANGIOPLASTY Right 09/23/2019   Procedure: PERIPHERAL VASCULAR BALLOON ANGIOPLASTY;  Surgeon: Wellington Hampshire, MD;  Location: [place] [facility];  Service: Cardiovascular;  Laterality: Right;  SFA   . RADIOLOGY WITH ANESTHESIA N/A 11/18/2018   Procedure: MRI OF THE CERVICAL SPINE WITHOUT CONTRAST;  Surgeon: Radiologist, Medication, MD;  Location: [place];  Service: Radiology;  Laterality: N/A;  . RADIOLOGY WITH ANESTHESIA N/A 12/03/2019   Procedure: MRI WITH ANESTHESIA CERVICAL WITH AND WITHOUT CONTRAST;  Surgeon: Radiologist, Medication, MD;  Location: [place];  Service: Radiology;  Laterality: N/A;  . TONSILECTOMY, ADENOIDECTOMY, BILATERAL MYRINGOTOMY AND TUBES  1981   put not aware of tubes being put in ears  . TUBAL LIGATION  1981     Social History   reports that she has been smoking cigarettes. She has a 33.00 pack-year smoking history. She has never used smokeless tobacco. She reports current alcohol use of about 2.0 standard drinks of alcohol per week. She reports that she does not use drugs.   Family History   Her family history includes Ovarian cancer in her mother.   Allergies Allergies  Allergen Reactions  . Carisoprodol Swelling  . Duloxetine Hcl Swelling and Hypertension    Sherrilyn Rist, MD [facility] Pager: [phone]

## 2020-08-26 NOTE — Progress Notes (Signed)
EEG completed, results pending. 

## 2020-08-26 NOTE — Procedures (Signed)
Patient Name: Amy Bruce  MRN: [id number]  Epilepsy Attending: Lora Havens  Referring Physician/Provider: Dr Katherene Ponto Date: 08/26/2020 Duration: 34.01 mins  Patient history: 69 y.o. female POD# 3 s/p right crani for SDH, has intermittent episodes of agitation followed by somnolence. EEG to evaluate for seizure.  Level of alertness:  lethargic  AEDs during EEG study: LEV  Technical aspects: This EEG study was done with scalp electrodes positioned according to the 10-20 International system of electrode placement. Electrical activity was acquired at a sampling rate of 500Hz  and reviewed with a high frequency filter of 70Hz  and a low frequency filter of 1Hz . EEG data were recorded continuously and digitally stored.   Description: EEG showed continuous generalized 3 to 6 Hz theta-delta slowing. Generalized periodic discharges with triphasic morphology at 2hz  were noted. Hyperventilation and photic stimulation were not performed.    ABNORMALITY -Continuous slow, generalized -Periodic discharges with triphasic morphology, generalized  IMPRESSION: This study is suggestive of severe diffuse encephalopathy, nonspecific etiology. Generalized periodic discharges with triphasic morphology were noted which is on ictal-interictal continuum but the frequency and morphology can also be seen with toxic-metabolic etiology. No seizures were seen throughout the recording.  A prolonged eeg can be considered if concern for ictal-interictal activity persists.  [name]

## 2020-08-26 NOTE — Progress Notes (Addendum)
  NEUROSURGERY PROGRESS NOTE   No issues overnight. Noted by night RN, pt has had episodes of agitation followed by significant somnolence.  EXAM:  BP 125/63   Pulse 74   Temp 99.3 F (37.4 C) (Axillary)   Resp (!) 8   Ht 5' (1.524 m)   Wt 76.7 kg   SpO2 92%   BMI 33.02 kg/m   Eyes open to stimuli Can say her name Follows simple commands all extremities Wound c/d/i JP in place, thin blood tinged fluid  IMPRESSION:  69 y.o. female POD# 3 s/p right crani for SDH, has intermittent episodes of agitation followed by somnolence - ?SZ  PLAN: - Can d/c JP today - Will get EEG - Start TF - Cont to monitor exam   I have reviewed the situation and the plan above with the patient's daughter at bedside. All her questions this am were answered.   Consuella Lose, MD [facility]

## 2020-08-26 NOTE — Progress Notes (Signed)
Physical Therapy Treatment Patient Details Name: Amy Bruce MRN: [id number] DOB: 02/28/1951 Today's Date: 08/26/2020    History of Present Illness 69 yo female admitted to ED On 12/14 after fall at doctor's office. CT demonstrated large right frontoparietal SDH with ~53mm R>L MLS and effacement of the right lateral ventricle, and early transtentorial uncal herniation; s/p R frontoparietal craniotomy for evacuation of SDH. PMH includes cervical fusion 04/2020, tobacco abuse, HTN, PAD, anxiety, fibromyalgia, ovarian cancer, pre-DM.    PT Comments    The pt continues to demo improvements in activity tolerance and command following/voluntary movements this session compared to prior therapy sessions. She was initially restless, but but with increased alertness, responsiveness, and intermittent eye opening when sitting in chair egress position and when transitioned to sitting EOB. The pt does require maxA of 1-2 for transitions at this time, and minA-maxA while sitting EOB to maintain static balance, but was able to follow cues and commands for improved positioning at times through the session. The pt will continue to benefit from skilled PT to further progress sitting balance and functional transfers, and will benefit from continued therapy following d/c to facilitate return to prior level of function and independence.     Follow Up Recommendations  Supervision/Assistance - 24 hour;CIR (pending progress and family support)     Equipment Recommendations   (defer to post acute)    Recommendations for Other Services       Precautions / Restrictions Precautions Precautions: Fall Precaution Comments: restraints, agitation, JP drain from crani Restrictions Weight Bearing Restrictions: No    Mobility  Bed Mobility Overal bed mobility: Needs Assistance Bed Mobility: Supine to Sit     Supine to sit: Max assist;HOB elevated     General bed mobility comments: initially trialed chair  egress position with significant reduction in restlessness. The pt presents with strong lean/pull to L side, and was eventually transitioned to sitting EOB. maxA to complete and return, minA-maxA to maintain stability in sitting  Transfers                 General transfer comment: pull to sit from elevated HOB with total A  Ambulation/Gait             General Gait Details: unable    Modified Rankin (Stroke Patients Only) Modified Rankin (Stroke Patients Only) Pre-Morbid Rankin Score: No significant disability Modified Rankin: Severe disability     Balance Overall balance assessment: Needs assistance Sitting-balance support: Single extremity supported;Feet supported Sitting balance-Leahy Scale: Poor Sitting balance - Comments: maxA to maintain at times with UE support Postural control: Right lateral lean;Other (comment) (forward lean)                                  Cognition Arousal/Alertness: Lethargic Behavior During Therapy: Restless Overall Cognitive Status: Difficult to assess                                 General Comments: Pt able to follwo multiple commands regarding RLE and RUE suhc as "kick your leg" or "reach for your daughters hand". The pt does need increased time to process and complete, but was able to do so consistently. No active command following with LLE or LUE during this session      Exercises Other Exercises Other Exercises: general PROM to bil UE and LE  General Comments General comments (skin integrity, edema, etc.): VSS on RA, daughter present and supportive      Pertinent Vitals/Pain Pain Assessment: Faces Faces Pain Scale: No hurt Pain Intervention(s): Limited activity within patient's tolerance;Monitored during session;Repositioned           PT Goals (current goals can now be found in the care plan section) Acute Rehab PT Goals Patient Stated Goal: unable to state PT Goal Formulation: Patient  unable to participate in goal setting Time For Goal Achievement: 09/07/20 Potential to Achieve Goals: Fair Progress towards PT goals: Progressing toward goals    Frequency    Min 3X/week      PT Plan Current plan remains appropriate       AM-PAC PT "6 Clicks" Mobility   Outcome Measure  Help needed turning from your back to your side while in a flat bed without using bedrails?: Total Help needed moving from lying on your back to sitting on the side of a flat bed without using bedrails?: Total Help needed moving to and from a bed to a chair (including a wheelchair)?: Total Help needed standing up from a chair using your arms (e.g., wheelchair or bedside chair)?: Total Help needed to walk in hospital room?: Total Help needed climbing 3-5 steps with a railing? : Total 6 Click Score: 6    End of Session   Activity Tolerance: Patient tolerated treatment well;Patient limited by fatigue Patient left: in bed;with call bell/phone within reach;with bed alarm set;with nursing/sitter in room;with family/visitor present;with restraints reapplied Nurse Communication: Mobility status PT Visit Diagnosis: Other abnormalities of gait and mobility (R26.89);Other symptoms and signs involving the nervous system (R29.898);History of falling (Z91.81)     Time: 1726-1750 PT Time Calculation (min) (ACUTE ONLY): 24 min  Charges:  $Therapeutic Activity: 23-37 mins                     Karma Ganja, PT, DPT   Acute Rehabilitation Department Pager #: [phone]   Otho Bellows 08/26/2020, 6:37 PM

## 2020-08-26 NOTE — Progress Notes (Signed)
SLP Cancellation Note  Patient Details Name: Amy Bruce MRN: [id number] DOB: Jan 16, 1951   Cancelled treatment:        Checked chart and with RN for swallow and cognitive assessment. RN reports pt not ready, she is not alert, IV Dilaudid. Cortrak placement planned for today. Will continue efforts.    Houston Siren 08/26/2020, 10:23 AM   Orbie Pyo Colvin Caroli.Ed Risk analyst [phone] Office [phone] '

## 2020-08-26 NOTE — Procedures (Signed)
Cortrak  Person Inserting Tube:  Amy Bruce, RD Tube Type:  Cortrak - 43 inches Tube Location:  Right nare Initial Placement:  Stomach Secured by: Bridle Technique Used to Measure Tube Placement:  Documented cm marking at nare/ corner of mouth Cortrak Secured At:  64 cm   No x-ray is required. RN may begin using tube.   If the tube becomes dislodged please keep the tube and contact the Cortrak team at [email or web address] (password [email or web address]) for replacement.  If after hours and replacement cannot be delayed, place a NG tube and confirm placement with an abdominal x-ray.    Amy Bruce RD, LDN Clinical Nutrition Pager listed in [place]

## 2020-08-27 LAB — BASIC METABOLIC PANEL
Anion gap: 10 (ref 5–15)
BUN: 10 mg/dL (ref 8–23)
CO2: 21 mmol/L — ABNORMAL LOW (ref 22–32)
Calcium: 8.3 mg/dL — ABNORMAL LOW (ref 8.9–10.3)
Chloride: 106 mmol/L (ref 98–111)
Creatinine, Ser: 0.67 mg/dL (ref 0.44–1.00)
GFR, Estimated: >60 mL/min (ref >60)
Glucose, Bld: 132 mg/dL — ABNORMAL HIGH (ref 70–99)
Potassium: 5.8 mmol/L — ABNORMAL HIGH (ref 3.5–5.1)
Sodium: 137 mmol/L (ref 135–145)

## 2020-08-27 LAB — GLUCOSE, CAPILLARY
Glucose-Capillary: 113 mg/dL — ABNORMAL HIGH (ref 70–99)
Glucose-Capillary: 115 mg/dL — ABNORMAL HIGH (ref 70–99)
Glucose-Capillary: 139 mg/dL — ABNORMAL HIGH (ref 70–99)
Glucose-Capillary: 148 mg/dL — ABNORMAL HIGH (ref 70–99)
Glucose-Capillary: 152 mg/dL — ABNORMAL HIGH (ref 70–99)
Glucose-Capillary: 167 mg/dL — ABNORMAL HIGH (ref 70–99)

## 2020-08-27 LAB — CBC
HCT: 32.6 % — ABNORMAL LOW (ref 36.0–46.0)
Hemoglobin: 11.1 g/dL — ABNORMAL LOW (ref 12.0–15.0)
MCH: 29.1 pg (ref 26.0–34.0)
MCHC: 34 g/dL (ref 30.0–36.0)
MCV: 85.3 fL (ref 80.0–100.0)
Platelets: 231 10*3/uL (ref 150–400)
RBC: 3.82 MIL/uL — ABNORMAL LOW (ref 3.87–5.11)
RDW: 16 % — ABNORMAL HIGH (ref 11.5–15.5)
WBC: 8.6 10*3/uL (ref 4.0–10.5)
nRBC: 0 % (ref 0.0–0.2)

## 2020-08-27 LAB — MAGNESIUM: Magnesium: 2.3 mg/dL (ref 1.7–2.4)

## 2020-08-27 LAB — PHOSPHORUS: Phosphorus: 1.7 mg/dL — ABNORMAL LOW (ref 2.5–4.6)

## 2020-08-27 MED ORDER — ORAL CARE MOUTH RINSE
15.0000 mL | Freq: Two times a day (BID) | OROMUCOSAL | Status: DC
  Administered 2020-08-27 – 2020-09-01 (×10): 15 mL via OROMUCOSAL

## 2020-08-27 MED ORDER — HYDROCODONE-ACETAMINOPHEN 5-325 MG PO TABS
1.0000 | ORAL_TABLET | ORAL | Status: DC | PRN
  Administered 2020-08-27 – 2020-08-31 (×6): 1
  Filled 2020-08-27 (×6): qty 1

## 2020-08-27 MED ORDER — SODIUM PHOSPHATES 45 MMOLE/15ML IV SOLN
30.0000 mmol | Freq: Once | INTRAVENOUS | Status: AC
  Administered 2020-08-27: 11:00:00 30 mmol via INTRAVENOUS
  Filled 2020-08-27: qty 10

## 2020-08-27 MED ORDER — BETHANECHOL CHLORIDE 10 MG PO TABS
10.0000 mg | ORAL_TABLET | Freq: Three times a day (TID) | ORAL | Status: DC
  Administered 2020-08-27 – 2020-09-01 (×17): 10 mg
  Filled 2020-08-27 (×17): qty 1

## 2020-08-27 MED ORDER — LEVETIRACETAM 100 MG/ML PO SOLN
1000.0000 mg | Freq: Two times a day (BID) | ORAL | Status: DC
  Administered 2020-08-27 – 2020-09-01 (×11): 1000 mg
  Filled 2020-08-27 (×12): qty 10

## 2020-08-27 MED ORDER — AMLODIPINE BESYLATE 10 MG PO TABS
10.0000 mg | ORAL_TABLET | Freq: Every day | ORAL | Status: DC
  Administered 2020-08-27 – 2020-09-01 (×6): 10 mg
  Filled 2020-08-27 (×6): qty 1

## 2020-08-27 MED ORDER — CHLORHEXIDINE GLUCONATE 0.12 % MT SOLN
15.0000 mL | Freq: Two times a day (BID) | OROMUCOSAL | Status: DC
  Administered 2020-08-27 – 2020-09-01 (×11): 15 mL via OROMUCOSAL
  Filled 2020-08-27 (×10): qty 15

## 2020-08-27 NOTE — Evaluation (Signed)
Clinical/Bedside Swallow Evaluation Patient Details  Name: Amy Bruce MRN: [id number] Date of Birth: Feb 27, 1951  Today's Date: 08/27/2020 Time: SLP Start Time (ACUTE ONLY): 8182 SLP Stop Time (ACUTE ONLY): 0924 SLP Time Calculation (min) (ACUTE ONLY): 8 min  Past Medical History:  Past Medical History:  Diagnosis Date  . Anemia    in the past  . Anxiety    yrs. ago panic attacks  . Arthritis   . Cancer ([place])    small in ovary - had total hysterestomy  . Complication of anesthesia    pt. reported her father became deaf after having anesthesia  . Fibromyalgia   . GERD (gastroesophageal reflux disease)   . Heart murmur    due to rheumatic fever, PCP no longer hears it  . Hyperlipidemia   . Hypertension   . Myalgia   . Neuropathy   . PAD (peripheral artery disease) ([place])   . PAD (peripheral artery disease) ([place])   . Pre-diabetes   . Pyelonephritis 09/10/2019    "septic"  . Tobacco abuse   . 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 ABDOMINAL AORTOGRAM W/LOWER EXTREMITY N/A 04/02/2018   Procedure: ABDOMINAL AORTOGRAM W/LOWER EXTREMITY;  Surgeon: Wellington Hampshire, MD;  Location: [place] [facility];  Service: Cardiovascular;  Laterality: N/A;  . ABDOMINAL AORTOGRAM W/LOWER EXTREMITY N/A 09/23/2019   Procedure: ABDOMINAL AORTOGRAM W/LOWER EXTREMITY;  Surgeon: Wellington Hampshire, MD;  Location: [place] [facility];  Service: Cardiovascular;  Laterality: N/A;  . ABDOMINAL HYSTERECTOMY  1999  . CERVICAL SPINE SURGERY  9937,1696   C5/6 6/7  . COLONOSCOPY    . CRANIOTOMY Right 08/23/2020   Procedure: RIGHT CRANIOTOMY HEMATOMA EVACUATION SUBDURAL;  Surgeon: Consuella Lose, MD;  Location: [place];  Service: Neurosurgery;  Laterality: Right;  . ECTOPIC PREGNANCY SURGERY    . Left axillary cyst removal 1989    . MULTIPLE EXTRACTIONS WITH ALVEOLOPLASTY N/A 10/25/2017   Procedure: EXTRACTION  NUMBERS FIVE, SEVEN, EIGHT, NINE, TEN, FIFTEEN, EIGHTEEN WITH ALVEOLOPLASTY, IRRIGATION AND DEBRIDEMENT LEFT SUBMANDIBULAR INFECTION;  Surgeon: Diona Browner, DDS;  Location: [place];  Service: Oral Surgery;  Laterality: N/A;  . PERIPHERAL VASCULAR ATHERECTOMY Right 09/23/2019   Procedure: PERIPHERAL VASCULAR ATHERECTOMY;  Surgeon: Wellington Hampshire, MD;  Location: [place] [facility];  Service: Cardiovascular;  Laterality: Right;  SFA  . PERIPHERAL VASCULAR BALLOON ANGIOPLASTY Right 09/23/2019   Procedure: PERIPHERAL VASCULAR BALLOON ANGIOPLASTY;  Surgeon: Wellington Hampshire, MD;  Location: [place] [facility];  Service: Cardiovascular;  Laterality: Right;  SFA  . RADIOLOGY WITH ANESTHESIA N/A 11/18/2018   Procedure: MRI OF THE CERVICAL SPINE WITHOUT CONTRAST;  Surgeon: Radiologist, Medication, MD;  Location: [place];  Service: Radiology;  Laterality: N/A;  . RADIOLOGY WITH ANESTHESIA N/A 12/03/2019   Procedure: MRI WITH ANESTHESIA CERVICAL WITH AND WITHOUT CONTRAST;  Surgeon: Radiologist, Medication, MD;  Location: [place];  Service: Radiology;  Laterality: N/A;  . TONSILECTOMY, ADENOIDECTOMY, BILATERAL MYRINGOTOMY AND TUBES  1981   put not aware of tubes being put in ears  . TUBAL LIGATION  1981   HPI:  [name] is a 69 yo F w/ PAD on DAPT admit after fall with trauma to head on metal pipe. Found to be altered with acute large subdural hemorrhage. Now s/ps crani with JP drain in place.  CT 12/15: "Interval evacuation of right subdural hematoma with surgical drain in place. Mild  residual blood product within the subdural space seen both within the operative bed and along the falx as well as trace subarachnoid hemorrhage, unchanged. Markedly improved mass effect  with 4-5 mm residual right to left midline shift. No interval hemorrhage."  CXR 12/14: "Patchy bibasilar opacities, atelectasis versus infiltrates"   Assessment / Plan / Recommendation Clinical Impression  Pt presents with severe oropharyngeal  dysphagia at this time. Xerostomia noted.  SLP provided oral care prior to initiation of PO trials.  Pt was minimally resistant. There was poor bolus awareness and manipulation.  Pt responded to thermal stimulation to lips there was labial movement to attempt to retrieve bolus from spoon, but no oral response or attempt to transit once ice chip was in oral cavity. SLP removed with digital sweep.  With small amounts of water by spoon, there was prolonged oral holding.  Any transit acheived was passive. Pt initiated swallow reflex in 1 of 2 trials.  Laryngeal elveation and excursion seemingly reduced on palpation and no cough reflex was observed.  Suction was used to remove remainder bolus trials.   Moisturizing gel applied to lips and tongue prior to departure.  SLP will follow for PO readiness.  Recommend pt remain NPO with alternate means of nutrition, hydration, and medication at this time.  SLP Visit Diagnosis: Dysphagia, unspecified (R13.10)    Aspiration Risk  Severe aspiration risk    Diet Recommendation NPO;Alternative means - temporary   Medication Administration: Via alternative means    Other  Recommendations Oral Care Recommendations: Oral care QID   Follow up Recommendations   TBD     Frequency and Duration min 2x/week  2 weeks       Prognosis   Good with continued recovery     Swallow Study   General Date of Onset: 08/23/20 HPI: [name] is a 69 yo F w/ PAD on DAPT admit after fall with trauma to head on metal pipe. Found to be altered with acute large subdural hemorrhage. Now s/ps crani with JP drain in place.  CT 12/15: "Interval evacuation of right subdural hematoma with surgical drain in place. Mild residual blood product within the subdural space seen both within the operative bed and along the falx as well as trace subarachnoid hemorrhage, unchanged. Markedly improved mass effect  with 4-5 mm residual right to left midline shift. No interval hemorrhage."  CXR 12/14:  "Patchy bibasilar opacities, atelectasis versus infiltrates" Type of Study: Bedside Swallow Evaluation Previous Swallow Assessment: None Diet Prior to this Study: NPO;NG Tube Temperature Spikes Noted: No Respiratory Status: Nasal cannula History of Recent Intubation: Yes Length of Intubations (days): 1 days Date extubated: 08/24/20 Behavior/Cognition: Lethargic/Drowsy;Agitated;Doesn't follow directions;Impulsive Oral Cavity Assessment: Dry Oral Care Completed by SLP: Yes Oral Cavity - Dentition: Missing dentition (Top, front) Self-Feeding Abilities: Total assist Patient Positioning: Upright in bed Baseline Vocal Quality: Normal Volitional Cough: Cognitively unable to elicit Volitional Swallow: Unable to elicit    Oral/Motor/Sensory Function Overall Oral Motor/Sensory Function: Moderate impairment (Unable to assess)   Ice Chips Ice chips: Impaired Presentation: Spoon Oral Phase Impairments: Reduced labial seal;Reduced lingual movement/coordination;Poor awareness of bolus Oral Phase Functional Implications: Oral holding (Absent oral response)   Thin Liquid Thin Liquid: Impaired Presentation: Spoon Oral Phase Impairments: Reduced labial seal;Reduced lingual movement/coordination;Impaired mastication;Poor awareness of bolus Oral Phase Functional Implications: Oral holding;Oral residue Pharyngeal  Phase Impairments: Unable to trigger swallow;Decreased hyoid-laryngeal movement;Suspected delayed Swallow    Nectar Thick Nectar Thick Liquid: Not tested   Honey Thick Honey Thick Liquid:  Not tested   Puree Puree: Not tested   Solid     Solid: Not tested      Celedonio Savage, [place], [place] Office: [phone]; Pager (12/18): [phone] 08/27/2020,9:48 AM

## 2020-08-27 NOTE — Progress Notes (Signed)
NAME:  Amy Bruce, MRN:  [id number], DOB:  02-16-1951, LOS: 4 ADMISSION DATE:  08/23/2020, CONSULTATION DATE:  12/14 REFERRING MD:  Dr.Nundkumar, CHIEF COMPLAINT:  Fall   Brief History   Amy Bruce is a 69 yo F w/ PAD on DAPT admit after fall with trauma to head on metal pipe. Found to be altered with acute large subdural hemorrhage. Underwent evacuation in OR today and admit to neuro ICU.  History of present illness   Amy Bruce is a 69 yo F w/ chronic back pain, HLD, tobacco use, PAD on aspirin, plavix presenting after fall. She is intubated currently. History obtained via chart review.  Amy Bruce was in her usual state of health until earlier today when she was coming out of her cardiologist's office when she slipped and fell on a metal pipe. She hit her head without loss of consciousness and was urgently brought to [facility] for evaluation. She began to be more confused, somnolent in the ED and was found to have subdural hemorrhage with midline shift on CT head. She was intubated and neurosurgery emergently brought her to OR for evacuation with Dr.Nundkumar.  Past Medical History  PVD, PAD, HLD, HTN, Chronic pain, GERD  Significant Hospital Events   08/23/20 Present to ED  Consults:  Neurosurg  Procedures:  08/23/20 R craniotomy and drain placement  Significant Diagnostic Tests:  08/23/20 CT head IMPRESSION: Acute right cerebral convexity greater than falcine subdural hematoma. 12 mm leftward midline shift. No definite ventricle trapping at this time.  Micro Data:  12/14 COVID/flu neg  Antimicrobials:  12/14 Ancef for surgical prophylaxis  Interim history/subjective:  No overnight events Off dexmedetomidine She awakens and easily gets agitated Objective   Blood pressure 134/72, pulse 99, temperature 98.9 F (37.2 C), temperature source Axillary, resp. rate 19, height 5' (1.524 m), weight 77.1 kg, SpO2 100 %.        Intake/Output Summary (Last 24 hours) at 08/27/2020  0926 Last data filed at 08/27/2020 0900 Gross per 24 hour  Intake 3147.39 ml  Output 1640 ml  Net 1507.39 ml   Filed Weights   08/23/20 1500 08/27/20 0500  Weight: 76.7 kg 77.1 kg    Examination: Gen: Does not appear to be in acute distress HEENT: Craniotomy site covered with bandage Neck: Neck is supple, no adenopathy CV: S1-S2 appreciated  Pulm: Clear breath sounds bilaterally Abd: Bowel sounds appreciated Extm: No edema Skin: Dry, Warm, normal turgor.  Neuro: Agitated, following directions intermittently  Resolved Hospital Problem list     Assessment & Plan:   Acute encephalopathy secondary to large acute subdural hematoma Postop liquidation drain placement -Goal is to keep blood pressure less than 140  Acute hypoxic respiratory failure -Was successfully weaned off the ventilator and currently on room air  Agitation -Continue low-dose dexmedetomidine  Patient with a history of chronic pain -Pain management  Nutrition -Feeding tube in place  Peripheral arterial disease Antiplatelets on hold  Uncontrolled hypertension -Add Norvasc per tube  For feeding tube placement today Discussed with daughter at bedside  Best practice (evaluated daily)   Diet: NPO til S&S Pain/Anxiety/Delirium protocol (if indicated): Dilaudid, Precedex VAP protocol (if indicated): Y DVT prophylaxis: SCD GI prophylaxis: PPI Glucose control: SSI Mobility: BR last date of multidisciplinary goals of care discussion N/A Family and staff present N Summary of discussion N/A Follow up goals of care discussion due 08/24/20 Code Status: Full Disposition: ICU  Labs   CBC: Recent Labs  Lab 08/23/20 1120 08/23/20 1340 08/23/20 1603  08/24/20 0426 08/24/20 0505 08/25/20 0643 08/27/20 0719  WBC 7.8  --   --  10.8*  --  9.4 8.6  NEUTROABS 5.3  --   --   --   --   --   --   HGB 11.6*   < > 9.9* 10.8* 10.5* 11.1* 11.1*  HCT 34.6*   < > 29.0* 30.9* 31.0* 34.2* 32.6*  MCV 87.6  --    --  85.1  --  87.2 85.3  PLT 189  --   --  250  --  243 231   < > = values in this interval not displayed.    Basic Metabolic Panel: Recent Labs  Lab 08/23/20 1120 08/23/20 1340 08/23/20 1603 08/24/20 0426 08/24/20 0505 08/25/20 0643 08/27/20 0429  NA 137   < > 142 143 143 140 137  K 3.2*   < > 3.6 3.6 3.3* 3.5 5.8*  CL 103  --   --  107  --  105 106  CO2 25  --   --  26  --  24 21*  GLUCOSE 169*  --   --  126*  --  129* 132*  BUN 10  --   --  7*  --  6* 10  CREATININE 0.94  --   --  0.77  --  0.64 0.67  CALCIUM 8.9  --   --  8.6*  --  8.9 8.3*  MG  --   --   --  1.7  --  2.2 2.3  PHOS  --   --   --  2.8  --  3.1 1.7*   < > = values in this interval not displayed.   GFR: Estimated Creatinine Clearance: 60 mL/min (by C-G formula based on SCr of 0.67 mg/dL). Recent Labs  Lab 08/23/20 1120 08/24/20 0426 08/25/20 0643 08/27/20 0719  WBC 7.8 10.8* 9.4 8.6    Liver Function Tests: Recent Labs  Lab 08/23/20 1120  AST 16  ALT 11  ALKPHOS 65  BILITOT 0.4  PROT 6.1*  ALBUMIN 3.2*   No results for input(s): LIPASE, AMYLASE in the last 168 hours. Recent Labs  Lab 08/23/20 1120  AMMONIA 31    ABG    Component Value Date/Time   PHART 7.415 08/24/2020 0505   PCO2ART 44.6 08/24/2020 0505   PO2ART 131 (H) 08/24/2020 0505   HCO3 28.8 (H) 08/24/2020 0505   TCO2 30 08/24/2020 0505   O2SAT 99.0 08/24/2020 0505     Coagulation Profile: Recent Labs  Lab 08/23/20 1120  INR 0.9    Cardiac Enzymes: No results for input(s): CKTOTAL, CKMB, CKMBINDEX, TROPONINI in the last 168 hours.  HbA1C: Hgb A1c MFr Bld  Date/Time Value Ref Range Status  10/25/2017 08:29 AM 6.0 (H) 4.8 - 5.6 % Final    Comment:    (NOTE) Pre diabetes:          5.7%-6.4% Diabetes:              >6.4% Glycemic control for   <7.0% adults with diabetes     CBG: Recent Labs  Lab 08/26/20 1616 08/26/20 2052 08/27/20 0033 08/27/20 0456 08/27/20 0740  GLUCAP 106* 92 113* 148* 115*     Review of Systems:   Unable to assess  Past Medical History  She,  has a past medical history of Anemia, Anxiety, Arthritis, Cancer ([place]), Complication of anesthesia, Fibromyalgia, GERD (gastroesophageal reflux disease), Heart murmur, Hyperlipidemia, Hypertension, Myalgia, Neuropathy, PAD (peripheral  artery disease) ([place]), PAD (peripheral artery disease) ([place]), Pre-diabetes, Pyelonephritis (09/10/2019), Tobacco abuse, and Tuberculosis.   Surgical History    Past Surgical History:  Procedure Laterality Date  . ABDOMINAL AORTOGRAM W/LOWER EXTREMITY N/A 04/02/2018   Procedure: ABDOMINAL AORTOGRAM W/LOWER EXTREMITY;  Surgeon: Wellington Hampshire, MD;  Location: [place] [facility];  Service: Cardiovascular;  Laterality: N/A;  . ABDOMINAL AORTOGRAM W/LOWER EXTREMITY N/A 09/23/2019   Procedure: ABDOMINAL AORTOGRAM W/LOWER EXTREMITY;  Surgeon: Wellington Hampshire, MD;  Location: [place] [facility];  Service: Cardiovascular;  Laterality: N/A;  . ABDOMINAL HYSTERECTOMY  1999  . CERVICAL SPINE SURGERY  [id number]   C5/6 6/7  . COLONOSCOPY    . CRANIOTOMY Right 08/23/2020   Procedure: RIGHT CRANIOTOMY HEMATOMA EVACUATION SUBDURAL;  Surgeon: Consuella Lose, MD;  Location: [place];  Service: Neurosurgery;  Laterality: Right;  . ECTOPIC PREGNANCY SURGERY    . Left axillary cyst removal 1989    . MULTIPLE EXTRACTIONS WITH ALVEOLOPLASTY N/A 10/25/2017   Procedure: EXTRACTION NUMBERS FIVE, SEVEN, EIGHT, NINE, TEN, FIFTEEN, EIGHTEEN WITH ALVEOLOPLASTY, IRRIGATION AND DEBRIDEMENT LEFT SUBMANDIBULAR INFECTION;  Surgeon: Diona Browner, DDS;  Location: [place];  Service: Oral Surgery;  Laterality: N/A;  . PERIPHERAL VASCULAR ATHERECTOMY Right 09/23/2019   Procedure: PERIPHERAL VASCULAR ATHERECTOMY;  Surgeon: Wellington Hampshire, MD;  Location: [place] [facility];  Service: Cardiovascular;  Laterality: Right;  SFA  . PERIPHERAL VASCULAR BALLOON ANGIOPLASTY Right 09/23/2019   Procedure: PERIPHERAL VASCULAR BALLOON  ANGIOPLASTY;  Surgeon: Wellington Hampshire, MD;  Location: [place] [facility];  Service: Cardiovascular;  Laterality: Right;  SFA  . RADIOLOGY WITH ANESTHESIA N/A 11/18/2018   Procedure: MRI OF THE CERVICAL SPINE WITHOUT CONTRAST;  Surgeon: Radiologist, Medication, MD;  Location: [place];  Service: Radiology;  Laterality: N/A;  . RADIOLOGY WITH ANESTHESIA N/A 12/03/2019   Procedure: MRI WITH ANESTHESIA CERVICAL WITH AND WITHOUT CONTRAST;  Surgeon: Radiologist, Medication, MD;  Location: [place];  Service: Radiology;  Laterality: N/A;  . TONSILECTOMY, ADENOIDECTOMY, BILATERAL MYRINGOTOMY AND TUBES  1981   put not aware of tubes being put in ears  . TUBAL LIGATION  1981     Social History   reports that she has been smoking cigarettes. She has a 33.00 pack-year smoking history. She has never used smokeless tobacco. She reports current alcohol use of about 2.0 standard drinks of alcohol per week. She reports that she does not use drugs.   Family History   Her family history includes Ovarian cancer in her mother.   Allergies Allergies  Allergen Reactions  . Carisoprodol Swelling  . Duloxetine Hcl Swelling and Hypertension    Sherrilyn Rist, MD [place] [facility] Pager: [phone]

## 2020-08-27 NOTE — Progress Notes (Signed)
Providing Compassionate, Quality Care - Together   Subjective: Nurse reports no issues overnight.  Objective: Vital signs in last 24 hours: Temp:  [98.9 F (37.2 C)-100.3 F (37.9 C)] 98.9 F (37.2 C) (12/18 0400) Pulse Rate:  [72-128] 93 (12/18 1100) Resp:  [5-23] 17 (12/18 1100) BP: (83-157)/(56-96) 119/65 (12/18 1100) SpO2:  [89 %-100 %] 100 % (12/18 1100) Weight:  [77.1 kg] 77.1 kg (12/18 0500)  Intake/Output from previous day: 12/17 0701 - 12/18 0700 In: 2615.1 [I.V.:1889.3; NG/GT:526; IV Piggyback:199.8] Out: 1640 [Urine:1550; Drains:90] Intake/Output this shift: Total I/O In: 971.8 [I.V.:299.8; NG/GT:660; IV Piggyback:12] Out: -   Responds to voice, obtunded Incomprehensible speech Pupils 3 mm round sluggish Moves all extremities Incision closed with staples JP drain charged with 30 mL out overnight  Lab Results: Recent Labs    08/25/20 0643 08/27/20 0719  WBC 9.4 8.6  HGB 11.1* 11.1*  HCT 34.2* 32.6*  PLT 243 231   BMET Recent Labs    08/25/20 0643 08/27/20 0429  NA 140 137  K 3.5 5.8*  CL 105 106  CO2 24 21*  GLUCOSE 129* 132*  BUN 6* 10  CREATININE 0.64 0.67  CALCIUM 8.9 8.3*    Studies/Results: DG Abd Portable 1V  Result Date: 08/26/2020 CLINICAL DATA:  Nasogastric placement EXAM: PORTABLE ABDOMEN - 1 VIEW COMPARISON:  CT 3 days ago FINDINGS: No tube is visible at abdominal radiography. This goes as high as the lower chest. Gas pattern is normal. Arterial atherosclerotic calcification incidentally noted. IMPRESSION: No tube visible at abdominal radiography. This goes as high as the lower chest. Electronically Signed   By: Nelson Chimes M.D.   On: 08/26/2020 14:35   EEG adult  Result Date: 08/26/2020 Lora Havens, MD     08/26/2020  5:47 PM Patient Name: Amy Bruce MRN: [id number] Epilepsy Attending: Lora Havens Referring Physician/Provider: Dr Katherene Ponto Date: 08/26/2020 Duration: 34.01 mins Patient history: 69 y.o.  female POD# 3 s/p right crani for SDH, has intermittent episodes of agitation followed by somnolence. EEG to evaluate for seizure. Level of alertness:  lethargic AEDs during EEG study: LEV Technical aspects: This EEG study was done with scalp electrodes positioned according to the 10-20 International system of electrode placement. Electrical activity was acquired at a sampling rate of 500Hz  and reviewed with a high frequency filter of 70Hz  and a low frequency filter of 1Hz . EEG data were recorded continuously and digitally stored. Description: EEG showed continuous generalized 3 to 6 Hz theta-delta slowing. Generalized periodic discharges with triphasic morphology at 2hz  were noted. Hyperventilation and photic stimulation were not performed.   ABNORMALITY -Continuous slow, generalized -Periodic discharges with triphasic morphology, generalized IMPRESSION: This study is suggestive of severe diffuse encephalopathy, nonspecific etiology. Generalized periodic discharges with triphasic morphology were noted which is on ictal-interictal continuum but the frequency and morphology can also be seen with toxic-metabolic etiology. No seizures were seen throughout the recording. A prolonged eeg can be considered if concern for ictal-interictal activity persists. Priyanka Barbra Sarks    Assessment/Plan: Patient is four days status post right frontoparietal craniotomy for evacuation of SDH by Dr. Kathyrn Sheriff. The patient is gradually improving. JP drain removed today. Site closed with one staple. No bleeding noted.   LOS: 4 days    -Continue supportive efforts   Viona Gilmore, DNP, AGNP-C Nurse Practitioner  [facility] [place] [address], [address], [place],  [address] P: [phone]    F: [phone]  08/27/2020, 11:18  AM

## 2020-08-28 DIAGNOSIS — G894 Chronic pain syndrome: Secondary | ICD-10-CM

## 2020-08-28 LAB — BASIC METABOLIC PANEL
Anion gap: 12 (ref 5–15)
BUN: 8 mg/dL (ref 8–23)
CO2: 24 mmol/L (ref 22–32)
Calcium: 8.9 mg/dL (ref 8.9–10.3)
Chloride: 103 mmol/L (ref 98–111)
Creatinine, Ser: 0.64 mg/dL (ref 0.44–1.00)
GFR, Estimated: >60 mL/min (ref >60)
Glucose, Bld: 150 mg/dL — ABNORMAL HIGH (ref 70–99)
Potassium: 3.2 mmol/L — ABNORMAL LOW (ref 3.5–5.1)
Sodium: 139 mmol/L (ref 135–145)

## 2020-08-28 LAB — GLUCOSE, CAPILLARY
Glucose-Capillary: 122 mg/dL — ABNORMAL HIGH (ref 70–99)
Glucose-Capillary: 140 mg/dL — ABNORMAL HIGH (ref 70–99)
Glucose-Capillary: 144 mg/dL — ABNORMAL HIGH (ref 70–99)
Glucose-Capillary: 157 mg/dL — ABNORMAL HIGH (ref 70–99)
Glucose-Capillary: 158 mg/dL — ABNORMAL HIGH (ref 70–99)
Glucose-Capillary: 169 mg/dL — ABNORMAL HIGH (ref 70–99)
Glucose-Capillary: 172 mg/dL — ABNORMAL HIGH (ref 70–99)

## 2020-08-28 LAB — PHOSPHORUS: Phosphorus: 2.8 mg/dL (ref 2.5–4.6)

## 2020-08-28 MED ORDER — METOPROLOL TARTRATE 25 MG/10 ML ORAL SUSPENSION
25.0000 mg | Freq: Two times a day (BID) | ORAL | Status: DC
  Filled 2020-08-28: qty 10

## 2020-08-28 MED ORDER — METOPROLOL TARTRATE 25 MG/10 ML ORAL SUSPENSION
25.0000 mg | Freq: Two times a day (BID) | ORAL | Status: DC
  Administered 2020-08-28 – 2020-09-01 (×9): 25 mg
  Filled 2020-08-28 (×8): qty 10

## 2020-08-28 MED ORDER — POTASSIUM CHLORIDE 20 MEQ PO PACK
20.0000 meq | PACK | Freq: Once | ORAL | Status: AC
  Administered 2020-08-28: 10:00:00 20 meq

## 2020-08-28 MED ORDER — POTASSIUM CHLORIDE 10 MEQ/100ML IV SOLN
10.0000 meq | INTRAVENOUS | Status: AC
  Administered 2020-08-28 (×4): 10 meq via INTRAVENOUS
  Filled 2020-08-28 (×4): qty 100

## 2020-08-28 MED ORDER — POTASSIUM CHLORIDE CRYS ER 20 MEQ PO TBCR
20.0000 meq | EXTENDED_RELEASE_TABLET | ORAL | Status: DC
Start: 2020-08-28 — End: 2020-08-28
  Administered 2020-08-28: 06:00:00 20 meq via ORAL
  Filled 2020-08-28 (×2): qty 1

## 2020-08-28 MED ORDER — POTASSIUM CHLORIDE 20 MEQ PO PACK
40.0000 meq | PACK | Freq: Once | ORAL | Status: DC
  Filled 2020-08-28: qty 2

## 2020-08-28 MED ORDER — POTASSIUM & SODIUM PHOSPHATES 280-160-250 MG PO PACK
1.0000 | PACK | ORAL | Status: AC
  Administered 2020-08-28 (×2): 1
  Filled 2020-08-28 (×2): qty 1

## 2020-08-28 NOTE — Progress Notes (Signed)
Neurosurgery Service Progress Note  Subjective: No acute events overnight   Objective: Vitals:   08/28/20 0900 08/28/20 1000 08/28/20 1100 08/28/20 1111  BP: (!) 157/70 (!) 155/83 (!) 146/72   Pulse: 98 99 94   Resp: 19 18 (!) 21   Temp:    98.3 F (36.8 C)  TempSrc:    Axillary  SpO2: 100% 100% 100%   Weight:      Height:        Physical Exam: Somnolent but Ox3, FCx4 with L side diffusely 4-/5, incision c/d/i  Assessment & Plan: 69 y.o. woman s/p evac of very large R SDH, recovering well.  -transfer to stepdown -SCDs/TEDs, ASA/clopidogrel still wearing off, will hold off on starting SQH today  Judith Part  08/28/20 11:36 AM

## 2020-08-28 NOTE — Progress Notes (Signed)
NAME:  Amy Bruce, MRN:  [id number], DOB:  1950-11-04, LOS: 5 ADMISSION DATE:  08/23/2020, CONSULTATION DATE:  12/14 REFERRING MD:  Dr.Nundkumar, CHIEF COMPLAINT:  Fall   Brief History   Amy Bruce is a 69 yo F w/ PAD on DAPT admit after fall with trauma to head on metal pipe. Found to be altered with acute large subdural hemorrhage. Underwent evacuation in OR today and admit to neuro ICU.  History of present illness   Amy Bruce is a 69 yo F w/ chronic back pain, HLD, tobacco use, PAD on aspirin, plavix presenting after fall. She is intubated currently. History obtained via chart review.  Amy Bruce was in her usual state of health until earlier today when she was coming out of her cardiologist's office when she slipped and fell on a metal pipe. She hit her head without loss of consciousness and was urgently brought to [facility] for evaluation. She began to be more confused, somnolent in the ED and was found to have subdural hemorrhage with midline shift on CT head. She was intubated and neurosurgery emergently brought her to OR for evacuation with Dr.Nundkumar.  Past Medical History  PVD, PAD, HLD, HTN, Chronic pain, GERD  Significant Hospital Events   08/23/20 Present to ED  Consults:  Neurosurg  Procedures:  08/23/20 R craniotomy and drain placement  Significant Diagnostic Tests:  08/23/20 CT head IMPRESSION: Acute right cerebral convexity greater than falcine subdural hematoma. 12 mm leftward midline shift. No definite ventricle trapping at this time.  Micro Data:  12/14 COVID/flu neg  Antimicrobials:  12/14 Ancef for surgical prophylaxis  Interim history/subjective:  No overnight events Much more interactive today, appropriate Following commands Objective   Blood pressure (!) 148/79, pulse (!) 102, temperature 98 F (36.7 C), temperature source Oral, resp. rate 17, height 5' (1.524 m), weight 75.9 kg, SpO2 100 %.        Intake/Output Summary (Last 24 hours) at  08/28/2020 0847 Last data filed at 08/28/2020 0600 Gross per 24 hour  Intake 1696.62 ml  Output 2650 ml  Net -953.38 ml   Filed Weights   08/23/20 1500 08/27/20 0500 08/28/20 0451  Weight: 76.7 kg 77.1 kg 75.9 kg    Examination: Gen: In no acute distress HEENT: Craniotomy site covered with bandage Neck: Neck is supple, no adenopathy CV: S1-S2 appreciated Pulm: Clear breath sounds bilaterally Abd: Bowel sounds appreciated Extm: No edema Skin: Dry, Warm, normal turgor.  Neuro: Calm, following commands  Resolved Hospital Problem list     Assessment & Plan:   Acute encephalopathy secondary to large acute subdural hematoma -Maintain normotension -On Norvasc  Acute hypoxic respiratory failure -On room air at present  Agitation -Currently off dexmedetomidine  History of chronic pain -Pain management  Nutrition -Feeding tube in place   Peripheral arterial disease Antiplatelets on hold  Hypertension -On Norvasc -Will had metoprolol-25 p.o. twice daily  Updated daughter at bedside  Best practice (evaluated daily)   Diet: NPO til S&S Pain/Anxiety/Delirium protocol (if indicated): Elavil, Duragesic  VAP protocol (if indicated): Not indicated DVT prophylaxis: SCD GI prophylaxis: PPI Glucose control: SSI Mobility: BR Code Status: Full Disposition: ICU  Labs   CBC: Recent Labs  Lab 08/23/20 1120 08/23/20 1340 08/23/20 1603 08/24/20 0426 08/24/20 0505 08/25/20 0643 08/27/20 0719  WBC 7.8  --   --  10.8*  --  9.4 8.6  NEUTROABS 5.3  --   --   --   --   --   --  HGB 11.6*   < > 9.9* 10.8* 10.5* 11.1* 11.1*  HCT 34.6*   < > 29.0* 30.9* 31.0* 34.2* 32.6*  MCV 87.6  --   --  85.1  --  87.2 85.3  PLT 189  --   --  250  --  243 231   < > = values in this interval not displayed.    Basic Metabolic Panel: Recent Labs  Lab 08/23/20 1120 08/23/20 1340 08/24/20 0426 08/24/20 0505 08/25/20 0643 08/27/20 0429 08/28/20 0237  NA 137   < > 143 143 140  137 139  K 3.2*   < > 3.6 3.3* 3.5 5.8* 3.2*  CL 103  --  107  --  105 106 103  CO2 25  --  26  --  24 21* 24  GLUCOSE 169*  --  126*  --  129* 132* 150*  BUN 10  --  7*  --  6* 10 8  CREATININE 0.94  --  0.77  --  0.64 0.67 0.64  CALCIUM 8.9  --  8.6*  --  8.9 8.3* 8.9  MG  --   --  1.7  --  2.2 2.3  --   PHOS  --   --  2.8  --  3.1 1.7* 2.8   < > = values in this interval not displayed.   GFR: Estimated Creatinine Clearance: 59.6 mL/min (by C-G formula based on SCr of 0.64 mg/dL). Recent Labs  Lab 08/23/20 1120 08/24/20 0426 08/25/20 0643 08/27/20 0719  WBC 7.8 10.8* 9.4 8.6    Liver Function Tests: Recent Labs  Lab 08/23/20 1120  AST 16  ALT 11  ALKPHOS 65  BILITOT 0.4  PROT 6.1*  ALBUMIN 3.2*   No results for input(s): LIPASE, AMYLASE in the last 168 hours. Recent Labs  Lab 08/23/20 1120  AMMONIA 31    ABG    Component Value Date/Time   PHART 7.415 08/24/2020 0505   PCO2ART 44.6 08/24/2020 0505   PO2ART 131 (H) 08/24/2020 0505   HCO3 28.8 (H) 08/24/2020 0505   TCO2 30 08/24/2020 0505   O2SAT 99.0 08/24/2020 0505     Coagulation Profile: Recent Labs  Lab 08/23/20 1120  INR 0.9    Cardiac Enzymes: No results for input(s): CKTOTAL, CKMB, CKMBINDEX, TROPONINI in the last 168 hours.  HbA1C: Hgb A1c MFr Bld  Date/Time Value Ref Range Status  10/25/2017 08:29 AM 6.0 (H) 4.8 - 5.6 % Final    Comment:    (NOTE) Pre diabetes:          5.7%-6.4% Diabetes:              >6.4% Glycemic control for   <7.0% adults with diabetes     CBG: Recent Labs  Lab 08/27/20 1616 08/27/20 1941 08/28/20 0004 08/28/20 0429 08/28/20 0716  GLUCAP 167* 139* 140* 169* 158*    Review of Systems:   Unable to assess  Past Medical History  She,  has a past medical history of Anemia, Anxiety, Arthritis, Cancer ([place]), Complication of anesthesia, Fibromyalgia, GERD (gastroesophageal reflux disease), Heart murmur, Hyperlipidemia, Hypertension, Myalgia, Neuropathy,  PAD (peripheral artery disease) ([place]), PAD (peripheral artery disease) ([place]), Pre-diabetes, Pyelonephritis (09/10/2019), Tobacco abuse, and Tuberculosis.   Surgical History    Past Surgical History:  Procedure Laterality Date  . ABDOMINAL AORTOGRAM W/LOWER EXTREMITY N/A 04/02/2018   Procedure: ABDOMINAL AORTOGRAM W/LOWER EXTREMITY;  Surgeon: Wellington Hampshire, MD;  Location: [place] [facility];  Service: Cardiovascular;  Laterality: N/A;  . ABDOMINAL AORTOGRAM W/LOWER EXTREMITY N/A 09/23/2019   Procedure: ABDOMINAL AORTOGRAM W/LOWER EXTREMITY;  Surgeon: Wellington Hampshire, MD;  Location: [place] [facility];  Service: Cardiovascular;  Laterality: N/A;  . ABDOMINAL HYSTERECTOMY  1999  . CERVICAL SPINE SURGERY  [id number]   C5/6 6/7  . COLONOSCOPY    . CRANIOTOMY Right 08/23/2020   Procedure: RIGHT CRANIOTOMY HEMATOMA EVACUATION SUBDURAL;  Surgeon: Consuella Lose, MD;  Location: [place];  Service: Neurosurgery;  Laterality: Right;  . ECTOPIC PREGNANCY SURGERY    . Left axillary cyst removal 1989    . MULTIPLE EXTRACTIONS WITH ALVEOLOPLASTY N/A 10/25/2017   Procedure: EXTRACTION NUMBERS FIVE, SEVEN, EIGHT, NINE, TEN, FIFTEEN, EIGHTEEN WITH ALVEOLOPLASTY, IRRIGATION AND DEBRIDEMENT LEFT SUBMANDIBULAR INFECTION;  Surgeon: Diona Browner, DDS;  Location: [place];  Service: Oral Surgery;  Laterality: N/A;  . PERIPHERAL VASCULAR ATHERECTOMY Right 09/23/2019   Procedure: PERIPHERAL VASCULAR ATHERECTOMY;  Surgeon: Wellington Hampshire, MD;  Location: [place] [facility];  Service: Cardiovascular;  Laterality: Right;  SFA  . PERIPHERAL VASCULAR BALLOON ANGIOPLASTY Right 09/23/2019   Procedure: PERIPHERAL VASCULAR BALLOON ANGIOPLASTY;  Surgeon: Wellington Hampshire, MD;  Location: [place] [facility];  Service: Cardiovascular;  Laterality: Right;  SFA  . RADIOLOGY WITH ANESTHESIA N/A 11/18/2018   Procedure: MRI OF THE CERVICAL SPINE WITHOUT CONTRAST;  Surgeon: Radiologist, Medication, MD;  Location: [place];  Service:  Radiology;  Laterality: N/A;  . RADIOLOGY WITH ANESTHESIA N/A 12/03/2019   Procedure: MRI WITH ANESTHESIA CERVICAL WITH AND WITHOUT CONTRAST;  Surgeon: Radiologist, Medication, MD;  Location: [place];  Service: Radiology;  Laterality: N/A;  . TONSILECTOMY, ADENOIDECTOMY, BILATERAL MYRINGOTOMY AND TUBES  1981   put not aware of tubes being put in ears  . TUBAL LIGATION  1981     Social History   reports that she has been smoking cigarettes. She has a 33.00 pack-year smoking history. She has never used smokeless tobacco. She reports current alcohol use of about 2.0 standard drinks of alcohol per week. She reports that she does not use drugs.   Family History   Her family history includes Ovarian cancer in her mother.   Allergies Allergies  Allergen Reactions  . Carisoprodol Swelling  . Duloxetine Hcl Swelling and Hypertension    Sherrilyn Rist, MD [place] [facility] Pager: [phone]

## 2020-08-29 DIAGNOSIS — G9340 Encephalopathy, unspecified: Secondary | ICD-10-CM

## 2020-08-29 LAB — GLUCOSE, CAPILLARY
Glucose-Capillary: 140 mg/dL — ABNORMAL HIGH (ref 70–99)
Glucose-Capillary: 143 mg/dL — ABNORMAL HIGH (ref 70–99)
Glucose-Capillary: 150 mg/dL — ABNORMAL HIGH (ref 70–99)
Glucose-Capillary: 153 mg/dL — ABNORMAL HIGH (ref 70–99)
Glucose-Capillary: 163 mg/dL — ABNORMAL HIGH (ref 70–99)
Glucose-Capillary: 175 mg/dL — ABNORMAL HIGH (ref 70–99)

## 2020-08-29 MED ORDER — PREGABALIN 100 MG PO CAPS
100.0000 mg | ORAL_CAPSULE | Freq: Two times a day (BID) | ORAL | Status: DC
  Administered 2020-08-29 – 2020-09-01 (×6): 100 mg
  Filled 2020-08-29 (×6): qty 1

## 2020-08-29 NOTE — Evaluation (Signed)
Speech Language Pathology Evaluation Patient Details Name: Amy Bruce MRN: [id number] DOB: 1951-01-26 Today's Date: 08/29/2020 Time: 1000-1045 SLP Time Calculation (min) (ACUTE ONLY): 45 min  Problem List:  Patient Active Problem List   Diagnosis Date Noted  . Subdural hematoma ([place]) 08/23/2020  . Acute right-sided thoracic back pain 05/26/2020  . Carpal tunnel syndrome of right wrist 04/14/2020  . S/P cervical spinal fusion 04/14/2020  . Tobacco use disorder 11/30/2019  . Family history of multiple myeloma 10/14/2019  . Hyperglycemia 10/14/2019  . Hyperlipidemia 10/14/2019  . Microscopic hematuria 10/14/2019  . Neuropathic pain 08/27/2019  . Facet arthropathy, cervical 05/19/2019  . Tobacco abuse 04/08/2019  . Cervical radiculopathy at C5 01/15/2019  . Chronic scapular pain 12/18/2018  . Chronic right shoulder pain 10/17/2018  . Pyelonephritis 09/10/2018  . PAD (peripheral artery disease) ([place])   . Hypertension   . Chronic pain syndrome 10/11/2016  . Right shoulder pain 10/11/2016  . Neck pain, chronic 10/11/2016  . Numbness and tingling in right hand 10/11/2016  . Myalgia 10/11/2016   Past Medical History:  Past Medical History:  Diagnosis Date  . Anemia    in the past  . Anxiety    yrs. ago panic attacks  . Arthritis   . Cancer ([place])    small in ovary - had total hysterestomy  . Complication of anesthesia    pt. reported her father became deaf after having anesthesia  . Fibromyalgia   . GERD (gastroesophageal reflux disease)   . Heart murmur    due to rheumatic fever, PCP no longer hears it  . Hyperlipidemia   . Hypertension   . Myalgia   . Neuropathy   . PAD (peripheral artery disease) ([place])   . PAD (peripheral artery disease) ([place])   . Pre-diabetes   . Pyelonephritis 09/10/2019    "septic"  . Tobacco abuse   . 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  ABDOMINAL AORTOGRAM W/LOWER EXTREMITY N/A 04/02/2018   Procedure: ABDOMINAL AORTOGRAM W/LOWER EXTREMITY;  Surgeon: Wellington Hampshire, MD;  Location: [facility];  Service: Cardiovascular;  Laterality: N/A;  . ABDOMINAL AORTOGRAM W/LOWER EXTREMITY N/A 09/23/2019   Procedure: ABDOMINAL AORTOGRAM W/LOWER EXTREMITY;  Surgeon: Wellington Hampshire, MD;  Location: [facility];  Service: Cardiovascular;  Laterality: N/A;  . ABDOMINAL HYSTERECTOMY  1999  . CERVICAL SPINE SURGERY  [id number]   C5/6 6/7  . COLONOSCOPY    . CRANIOTOMY Right 08/23/2020   Procedure: RIGHT CRANIOTOMY HEMATOMA EVACUATION SUBDURAL;  Surgeon: Consuella Lose, MD;  Location: [facility];  Service: Neurosurgery;  Laterality: Right;  . ECTOPIC PREGNANCY SURGERY    . Left axillary cyst removal 1989    . MULTIPLE EXTRACTIONS WITH ALVEOLOPLASTY N/A 10/25/2017   Procedure: EXTRACTION NUMBERS FIVE, SEVEN, EIGHT, NINE, TEN, FIFTEEN, EIGHTEEN WITH ALVEOLOPLASTY, IRRIGATION AND DEBRIDEMENT LEFT SUBMANDIBULAR INFECTION;  Surgeon: Diona Browner, DDS;  Location: [facility];  Service: Oral Surgery;  Laterality: N/A;  . PERIPHERAL VASCULAR ATHERECTOMY Right 09/23/2019   Procedure: PERIPHERAL VASCULAR ATHERECTOMY;  Surgeon: Wellington Hampshire, MD;  Location: [facility];  Service: Cardiovascular;  Laterality: Right;  SFA  . PERIPHERAL VASCULAR BALLOON ANGIOPLASTY Right 09/23/2019   Procedure: PERIPHERAL VASCULAR BALLOON ANGIOPLASTY;  Surgeon: Wellington Hampshire, MD;  Location: [facility];  Service: Cardiovascular;  Laterality: Right;  SFA  . RADIOLOGY WITH ANESTHESIA N/A 11/18/2018  Procedure: MRI OF THE CERVICAL SPINE WITHOUT CONTRAST;  Surgeon: Radiologist, Medication, MD;  Location: [facility];  Service: Radiology;  Laterality: N/A;  . RADIOLOGY WITH ANESTHESIA N/A 12/03/2019   Procedure: MRI WITH ANESTHESIA CERVICAL WITH AND WITHOUT CONTRAST;  Surgeon: Radiologist, Medication, MD;  Location: [facility];  Service: Radiology;  Laterality: N/A;   . TONSILECTOMY, ADENOIDECTOMY, BILATERAL MYRINGOTOMY AND TUBES  1981   put not aware of tubes being put in ears  . TUBAL LIGATION  1981   HPI:  Ms.Hotz is a 69 yo F w/ PAD on DAPT admit after fall with trauma to head on metal pipe. Found to be altered with acute large subdural hemorrhage. Now s/ps crani with JP drain in place.  CT 12/15: "Interval evacuation of right subdural hematoma with surgical drain in place. Mild residual blood product within the subdural space seen both within the operative bed and along the falx as well as trace subarachnoid hemorrhage, unchanged. Markedly improved mass effect  with 4-5 mm residual right to left midline shift. No interval hemorrhage."  CXR 12/14: "Patchy bibasilar opacities, atelectasis versus infiltrates"   Assessment / Plan / Recommendation Clinical Impression  Pt demonstrates cognitive impairment following TBI with behavior consistent with  [place]. Note that daughter reports mild memory impairment at baseline and concern for mild dementia. Pt is able to consistently focus attention to basic verbal and functional interactions with initiation of purposeful behaviors. She does not sustain attention for more than 10-15 seconds, is easily distractible and verbose, perseverating on confabulated events. Her long term memory is intact, but she is disoriented to time and situation and does not develop short term memory despite cueing and reasoning. Awareness of deficits is absent. Discussed cognitive recovery and Rancho levels with daughter and provided handbook as well as cognitive strategies for family. Recommend CIR at d/c. Will follow acutely to offer opportunities for cognitive recovery.    SLP Assessment  SLP Recommendation/Assessment: Patient needs continued Speech Lanaguage Pathology Services SLP Visit Diagnosis: Cognitive communication deficit (R41.841)    Follow Up Recommendations       Frequency and Duration min 2x/week  2 weeks      SLP  Evaluation Cognition  Overall Cognitive Status: Impaired/Different from baseline Arousal/Alertness: Awake/alert Orientation Level: Oriented to person;Oriented to place;Disoriented to time;Disoriented to situation Attention: Focused;Sustained Focused Attention: Appears intact Sustained Attention: Impaired Sustained Attention Impairment: Verbal basic;Functional basic Memory: Impaired Memory Impairment: Decreased recall of new information;Decreased short term memory Decreased Short Term Memory: Verbal basic;Functional basic Awareness: Impaired Awareness Impairment: Intellectual impairment;Emergent impairment Problem Solving: Impaired Problem Solving Impairment: Verbal basic;Functional basic       Comprehension  Auditory Comprehension Overall Auditory Comprehension: Appears within functional limits for tasks assessed    Expression Verbal Expression Overall Verbal Expression: Appears within functional limits for tasks assessed   Oral / Motor  Motor Speech Overall Motor Speech: Appears within functional limits for tasks assessed   GO                    Hrishikesh Hoeg, Katherene Ponto 08/29/2020, 11:28 AM

## 2020-08-29 NOTE — Progress Notes (Signed)
  NEUROSURGERY PROGRESS NOTE   No issues overnight. Pt more awake, no complaint of HA this am.  EXAM:  BP (!) 115/99   Pulse 94   Temp 99.9 F (37.7 C) (Axillary)   Resp (!) 23   Ht 5' (1.524 m)   Wt 75.9 kg   SpO2 100%   BMI 32.68 kg/m   Opens eyes to stimuli Answers questions appropriately Speech fluent CN grossly intact  MAE well Wound c/d/i   IMPRESSION:  69 y.o. female POD# 6 s/p right crani for SDH, mental status improving.  PLAN: - Cont supportive care - PT/OT/SLP eval  Consuella Lose, MD [facility]

## 2020-08-29 NOTE — Progress Notes (Signed)
NAME:  Amy Bruce, MRN:  [id number], DOB:  [birth year], LOS: 6 ADMISSION DATE:  08/23/2020, CONSULTATION DATE:  12/14 REFERRING MD:  Dr.Nundkumar, CHIEF COMPLAINT:  Fall   Brief History   Amy Bruce is a 69 yo F w/ PAD on DAPT admit after fall with trauma to head on metal pipe. Found to be altered with acute large subdural hemorrhage. Underwent evacuation in OR today and admit to neuro ICU.  History of present illness   Amy Bruce is a 69 yo F w/ chronic back pain, HLD, tobacco use, PAD on aspirin, plavix presenting after fall. She is intubated currently. History obtained via chart review.  Amy Bruce was in her usual state of health until earlier today when she was coming out of her cardiologist's office when she slipped and fell on a metal pipe. She hit her head without loss of consciousness and was urgently brought to [facility] for evaluation. She began to be more confused, somnolent in the ED and was found to have subdural hemorrhage with midline shift on CT head. She was intubated and neurosurgery emergently brought her to OR for evacuation with Dr.Nundkumar.  Past Medical History  PVD, PAD, HLD, HTN, Chronic pain, GERD  Significant Bruce Events   08/23/20 Present to ED  Consults:  Neurosurg PCCM Procedures:  08/23/20 R craniotomy and drain placement 12/15 extubated   Significant Diagnostic Tests:  08/23/20 CT head IMPRESSION: Acute right cerebral convexity greater than falcine subdural hematoma. 12 mm leftward midline shift. No definite ventricle trapping at this time.  Micro Data:  12/14 COVID/flu neg  Antimicrobials:  12/14 Ancef for surgical prophylaxis  Interim history/subjective:  NAEO  No AM labs Remains HDS and on RA  Objective   Blood pressure (!) 145/73, pulse (!) 102, temperature 99.7 F (37.6 C), temperature source Axillary, resp. rate (!) 22, height 5' (1.524 m), weight 75.9 kg, SpO2 100 %.        Intake/Output Summary (Last 24 hours) at 08/29/2020  0847 Last data filed at 08/29/2020 0600 Gross per 24 hour  Intake 3041.06 ml  Output 3150 ml  Net -108.94 ml   Filed Weights   08/23/20 1500 08/27/20 0500 08/28/20 0451  Weight: 76.7 kg 77.1 kg 75.9 kg    Examination: Gen: older adult F, reclined in bed NAD eyes closed  HEENT: R crani site c/d/well approximated. Anicteric sclera. Tacky mm. Trachea midline  CV: rrr s1s2 no rgm  Pulm: CTAb  Abd: Soft round ndnt hypoactive bowel sounds  Extm: BUE mittens no obvious deformity  Skin: c/d/w/i Neuro: Awake, conversant, following commands. LUE LLE weaker than R side  Resolved Bruce Problem list   Acute hypoxic respiratory failure  Agitation   Assessment & Plan:   R SDH s/p evac -per NSGY   Acute encephalopathy secondary to large acute subdural hematoma - improving  P -Delirium precautions -PT/OT  Acute on chronic pain-- improving acute post op pain  -outpt on multimodal pain reg: lyrica elavil flexiril tizanidine, PRN lido patch  P -adding lyrica at lower dose than OP-- starting 100mg  BID (OP is 200mg  TID)  -dc fentanyl patch, cont PRN vicodin and dilaudid   Inadequate PO intake -EN via cortrak -SLP consult for swallow eval   Peripheral arterial disease Antiplatelets on hold  HTN -norvasc, metop -PRN hydral   Thank you for consulting PCCM. We will sign off. Please re-engage if we can be of further assistance or if the patient's clinical status changes   Best practice (evaluated daily)  Diet: EN via cortrak  Pain/Anxiety/Delirium protocol (if indicated): Elavil, lyrica, PRN vicodin PRN dilaudid  VAP protocol (if indicated): Not indicated DVT prophylaxis: SCD GI prophylaxis: PPI Glucose control: SSI Mobility: BR Code Status: Full Disposition: progressive   Labs   CBC: Recent Labs  Lab 08/23/20 1120 08/23/20 1340 08/23/20 1603 08/24/20 0426 08/24/20 0505 08/25/20 0643 08/27/20 0719  WBC 7.8  --   --  10.8*  --  9.4 8.6  NEUTROABS 5.3  --   --    --   --   --   --   HGB 11.6*   < > 9.9* 10.8* 10.5* 11.1* 11.1*  HCT 34.6*   < > 29.0* 30.9* 31.0* 34.2* 32.6*  MCV 87.6  --   --  85.1  --  87.2 85.3  PLT 189  --   --  250  --  243 231   < > = values in this interval not displayed.    Basic Metabolic Panel: Recent Labs  Lab 08/23/20 1120 08/23/20 1340 08/24/20 0426 08/24/20 0505 08/25/20 0643 08/27/20 0429 08/28/20 0237  NA 137   < > 143 143 140 137 139  K 3.2*   < > 3.6 3.3* 3.5 5.8* 3.2*  CL 103  --  107  --  105 106 103  CO2 25  --  26  --  24 21* 24  GLUCOSE 169*  --  126*  --  129* 132* 150*  BUN 10  --  7*  --  6* 10 8  CREATININE 0.94  --  0.77  --  0.64 0.67 0.64  CALCIUM 8.9  --  8.6*  --  8.9 8.3* 8.9  MG  --   --  1.7  --  2.2 2.3  --   PHOS  --   --  2.8  --  3.1 1.7* 2.8   < > = values in this interval not displayed.   GFR: Estimated Creatinine Clearance: 59.6 mL/min (by C-G formula based on SCr of 0.64 mg/dL). Recent Labs  Lab 08/23/20 1120 08/24/20 0426 08/25/20 0643 08/27/20 0719  WBC 7.8 10.8* 9.4 8.6    Liver Function Tests: Recent Labs  Lab 08/23/20 1120  AST 16  ALT 11  ALKPHOS 65  BILITOT 0.4  PROT 6.1*  ALBUMIN 3.2*   No results for input(s): LIPASE, AMYLASE in the last 168 hours. Recent Labs  Lab 08/23/20 1120  AMMONIA 31    ABG    Component Value Date/Time   PHART 7.415 08/24/2020 0505   PCO2ART 44.6 08/24/2020 0505   PO2ART 131 (H) 08/24/2020 0505   HCO3 28.8 (H) 08/24/2020 0505   TCO2 30 08/24/2020 0505   O2SAT 99.0 08/24/2020 0505     Coagulation Profile: Recent Labs  Lab 08/23/20 1120  INR 0.9    Cardiac Enzymes: No results for input(s): CKTOTAL, CKMB, CKMBINDEX, TROPONINI in the last 168 hours.  HbA1C: Hgb A1c MFr Bld  Date/Time Value Ref Range Status  10/25/2017 08:29 AM 6.0 (H) 4.8 - 5.6 % Final    Comment:    (NOTE) Pre diabetes:          5.7%-6.4% Diabetes:              >6.4% Glycemic control for   <7.0% adults with diabetes      CBG: Recent Labs  Lab 08/28/20 1506 08/28/20 1943 08/28/20 2332 08/29/20 0347 08/29/20 0812  GLUCAP 122* 157* 144* 143* 175*    CCT: na  Eliseo Gum MSN, AGACNP-BC [place] [phone] If no answer, [phone] 08/29/2020, 8:47 AM

## 2020-08-29 NOTE — Progress Notes (Signed)
Physical Therapy Treatment Patient Details Name: Amy Bruce MRN: [id number] DOB: [birth year] Today's Date: 08/29/2020    History of Present Illness 69 yo female admitted to ED On 12/14 after fall at doctor's office. CT demonstrated large right frontoparietal SDH with ~44mm R>L MLS and effacement of the right lateral ventricle, and early transtentorial uncal herniation; s/p R frontoparietal craniotomy for evacuation of SDH. PMH includes cervical fusion 04/2020, tobacco abuse, HTN, PAD, anxiety, fibromyalgia, ovarian cancer, pre-DM.    PT Comments    The pt was able to make good progress with mobility and PT goals at this session. Despite initial resistance to mobility due to disorientation to situation, the pt was eventually agreeable to sitting EOB and OOB transfers. She continues to require maxA of 1-2 to complete all bed mobility and transfers, but was able to stand and take steps with assist and use of RW at this time. The pt will continue to benefit from skilled PT to progress functional strength, stability, and endurance to allow for further OOB transfers and gait training.     Follow Up Recommendations  Supervision/Assistance - 24 hour;CIR (pending progress and family support)     Equipment Recommendations   (defer to post acuts)    Recommendations for Other Services       Precautions / Restrictions Precautions Precautions: Fall Precaution Comments: restraints, NG tube, agitation/confusion, R crani Restrictions Weight Bearing Restrictions: No    Mobility  Bed Mobility Overal bed mobility: Needs Assistance Bed Mobility: Supine to Sit;Sit to Supine     Supine to sit: Mod assist;HOB elevated Sit to supine: Max assist;+2 for physical assistance;HOB elevated   General bed mobility comments: Pt stating she wanted to walk, but initially resistant to sitting up at the EOB, required maxA to complete transition due to pt reluctance to assist.  Transfers Overall transfer  level: Needs assistance Equipment used: Rolling walker (2 wheeled) Transfers: Sit to/from Stand Sit to Stand: Mod assist;+2 physical assistance         General transfer comment: modA to rise from bed, +2 to steady for safety due to strong posterior lean  Ambulation/Gait Ambulation/Gait assistance: Max assist;+2 physical assistance Gait Distance (Feet): 4 Feet Assistive device: Rolling walker (2 wheeled) Gait Pattern/deviations: Step-to pattern;Decreased stride length;Decreased step length - left;Narrow base of support Gait velocity: decreased Gait velocity interpretation: <1.31 ft/sec, indicative of household ambulator General Gait Details: small lateral steps with significant assist to maintain balance, manage RW, and cues for each step    Modified Rankin (Stroke Patients Only) Modified Rankin (Stroke Patients Only) Pre-Morbid Rankin Score: No significant disability Modified Rankin: Severe disability     Balance Overall balance assessment: Needs assistance Sitting-balance support: Single extremity supported;Feet supported Sitting balance-Leahy Scale: Poor Sitting balance - Comments: maxA to maintain at times with UE support Postural control: Right lateral lean Standing balance support: Bilateral upper extremity supported Standing balance-Leahy Scale: Poor Standing balance comment: BUE support and mod/maxA                            Cognition Arousal/Alertness: Lethargic Behavior During Therapy: Restless Overall Cognitive Status: Impaired/Different from baseline                                 General Comments: Pt able to follow commands and answer questions about past employment and what food she likes from [employer], but remains disorented  to situation and was perseverating on getting to her apartment to nap before her appointment at [facility] tomorrow morning. Pt unable to be re-oriented through session.      Exercises      General  Comments General comments (skin integrity, edema, etc.): VSS through session, pt easily agitated, disoriented to situation without ability to be oriented despite max cues      Pertinent Vitals/Pain Pain Assessment: Faces Faces Pain Scale: Hurts a little bit Pain Location: generalized with limb movement Pain Descriptors / Indicators: Grimacing;Guarding Pain Intervention(s): Limited activity within patient's tolerance;Monitored during session;Repositioned           PT Goals (current goals can now be found in the care plan section) Acute Rehab PT Goals Patient Stated Goal: to go to her apartment to nap PT Goal Formulation: Patient unable to participate in goal setting Time For Goal Achievement: 09/07/20 Potential to Achieve Goals: Fair Progress towards PT goals: Progressing toward goals    Frequency    Min 3X/week      PT Plan Current plan remains appropriate       AM-PAC PT "6 Clicks" Mobility   Outcome Measure  Help needed turning from your back to your side while in a flat bed without using bedrails?: A Lot Help needed moving from lying on your back to sitting on the side of a flat bed without using bedrails?: A Lot Help needed moving to and from a bed to a chair (including a wheelchair)?: A Lot Help needed standing up from a chair using your arms (e.g., wheelchair or bedside chair)?: A Lot Help needed to walk in hospital room?: Total Help needed climbing 3-5 steps with a railing? : Total 6 Click Score: 10    End of Session Equipment Utilized During Treatment: Gait belt Activity Tolerance: Patient tolerated treatment well;Patient limited by fatigue Patient left: in bed;with call bell/phone within reach;with bed alarm set Nurse Communication: Mobility status PT Visit Diagnosis: Other abnormalities of gait and mobility (R26.89);Other symptoms and signs involving the nervous system (R29.898);History of falling (Z91.81)     Time: 1898-4210 PT Time Calculation (min)  (ACUTE ONLY): 34 min  Charges:  $Therapeutic Activity: 23-37 mins                     Karma Ganja, PT, DPT   Acute Rehabilitation Department Pager #: [phone]   Otho Bellows 08/29/2020, 5:50 PM

## 2020-08-29 NOTE — Progress Notes (Signed)
  Speech Language Pathology Treatment: Dysphagia  Patient Details Name: Amy Bruce MRN: [id number] DOB: August 12, 1951 Today's Date: 08/29/2020 Time: 1000-1045 SLP Time Calculation (min) (ACUTE ONLY): 45 min  Assessment / Plan / Recommendation Clinical Impression  Pt alert and talkative with daughter at bedside, but does not open eyes unless max encouragement given. Oral care just performed but lingual mucosa very dry. P able to tolerate ice chips, puree, sips of nectar juice and water without signs of aspiration. She immediate recognized food presented with hand over hand assist or total assist feeding with tactile cues. Upper partial placed for trials of solids, but pt became tired at end of session and could not sustain attention fully to complete mastication. Mucosa also still very dry with no saliva for the bolus. Recommend pt continue with Cortrak, but also be offered sips of water and bites of puree to aid in oral hygiene as long as alert and upright. Will f/u to further progress pt.    HPI HPI: Amy Bruce is a 69 yo F w/ PAD on DAPT admit after fall with trauma to head on metal pipe. Found to be altered with acute large subdural hemorrhage. Now s/ps crani with JP drain in place.  CT 12/15: "Interval evacuation of right subdural hematoma with surgical drain in place. Mild residual blood product within the subdural space seen both within the operative bed and along the falx as well as trace subarachnoid hemorrhage, unchanged. Markedly improved mass effect  with 4-5 mm residual right to left midline shift. No interval hemorrhage."  CXR 12/14: "Patchy bibasilar opacities, atelectasis versus infiltrates"      SLP Plan  Continue with current plan of care       Recommendations  Diet recommendations: Dysphagia 1 (puree);Thin liquid Liquids provided via: Cup;Straw Medication Administration: Via alternative means Supervision: Staff to assist with self feeding;Full supervision/cueing for  compensatory strategies Compensations: Slow rate;Small sips/bites Postural Changes and/or Swallow Maneuvers: Seated upright 90 degrees                Plan: Continue with current plan of care       GO               [name], MA CCC-SLP  Acute Rehabilitation Services Pager [phone] Office [phone]  [name] 08/29/2020, 11:08 AM

## 2020-08-30 ENCOUNTER — Other Ambulatory Visit: Payer: Self-pay | Admitting: Cardiovascular Disease

## 2020-08-30 DIAGNOSIS — W19XXXA Unspecified fall, initial encounter: Secondary | ICD-10-CM

## 2020-08-30 LAB — BASIC METABOLIC PANEL
Anion gap: 11 (ref 5–15)
BUN: 8 mg/dL (ref 8–23)
CO2: 26 mmol/L (ref 22–32)
Calcium: 9 mg/dL (ref 8.9–10.3)
Chloride: 101 mmol/L (ref 98–111)
Creatinine, Ser: 0.68 mg/dL (ref 0.44–1.00)
GFR, Estimated: >60 mL/min (ref >60)
Glucose, Bld: 122 mg/dL — ABNORMAL HIGH (ref 70–99)
Potassium: 3.5 mmol/L (ref 3.5–5.1)
Sodium: 138 mmol/L (ref 135–145)

## 2020-08-30 LAB — GLUCOSE, CAPILLARY
Glucose-Capillary: 115 mg/dL — ABNORMAL HIGH (ref 70–99)
Glucose-Capillary: 162 mg/dL — ABNORMAL HIGH (ref 70–99)
Glucose-Capillary: 141 mg/dL — ABNORMAL HIGH (ref 70–99)
Glucose-Capillary: 118 mg/dL — ABNORMAL HIGH (ref 70–99)
Glucose-Capillary: 106 mg/dL — ABNORMAL HIGH (ref 70–99)

## 2020-08-30 LAB — PHOSPHORUS: Phosphorus: 3.6 mg/dL (ref 2.5–4.6)

## 2020-08-30 NOTE — Progress Notes (Signed)
Rehab Admissions Coordinator Note:  Patient was screened by Cleatrice Burke for appropriateness for an Inpatient Acute Rehab Consult per therapy recommendations. .  At this time, we are recommending Inpatient Rehab consult. Please place order for rehab consult if you would like patient considered for CIR admit. Please advise.  Cleatrice Burke RN MSN 08/30/2020, 11:12 AM  I can be reached at [phone].

## 2020-08-30 NOTE — Progress Notes (Signed)
Occupational Therapy Treatment Patient Details Name: Amy Bruce MRN: [id number] DOB: 22-Jan-1951 Today's Date: 08/30/2020    History of present illness 69 yo female admitted to ED On 12/14 after fall at doctor's office. 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 PMH includes cervical fusion 04/2020, tobacco abuse, HTN, PAD, anxiety, fibromyalgia, ovarian cancer, pre-DM.   OT comments  Pt making excellent progress towards OT goals with additional goals added to maximize ADL independence. Pt alert and oriented to situation with improving recall of information during session. Pt with mild confusion and benefits from consistent multimodal cues for safe sequencing during task. Pt overall Mod A x 2 for bed mobility, sit to stand and pivot transfers with RW today. Pt overall Max A for toileting task for safety due to dynamic standing balance deficits but progressing well. Pt's daughter present and supportive throughout session. Continue to believe pt will benefit from intensive therapies with CIR. Plan to further address LB ADLs, cognition during functional tasks and standing balance.    Follow Up Recommendations  CIR;Supervision/Assistance - 24 hour    Equipment Recommendations  3 in 1 bedside commode;Other (comment) (RW)    Recommendations for Other Services Rehab consult    Precautions / Restrictions Precautions Precautions: Fall Precaution Comments: NG tube, confusion, R crani Restrictions Weight Bearing Restrictions: No       Mobility Bed Mobility Overal bed mobility: Needs Assistance Bed Mobility: Supine to Sit     Supine to sit: Mod assist;HOB elevated;+2 for physical assistance     General bed mobility comments: Mod A x 2, able to advance B LE to EOB with cues (light assist for L LE). Increased assist for  trunk  Transfers Overall transfer level: Needs assistance Equipment used: Rolling walker (2 wheeled) Transfers: Sit to/from [employer] Sit to Stand: Mod assist;+2 physical assistance Stand pivot transfers: Mod assist;+2 physical assistance;+2 safety/equipment       General transfer comment: Mod A x 2 for power up, cues for hand placement needed with each transition. Mod A x 2 for pivot > chair <> BSC with RW. Multimodal cues for sequencing and advancing RW    Balance Overall balance assessment: Needs assistance Sitting-balance support: Single extremity supported;Feet supported;Bilateral upper extremity supported Sitting balance-Leahy Scale: Fair Sitting balance - Comments: able to statically sit, min guard for safety   Standing balance support: Bilateral upper extremity supported;During functional activity Standing balance-Leahy Scale: Poor Standing balance comment: reliant on B UE support                           ADL either performed or assessed with clinical judgement   ADL Overall ADL's : Needs assistance/impaired                     Lower Body Dressing: Maximal assistance;Sit to/from stand Lower Body Dressing Details (indicate cue type and reason): Max A for doffing soiled brief and donning new one. Toilet Transfer: Moderate assistance;+2 for physical assistance;+2 for safety/equipment;Stand-pivot;BSC;RW Toilet Transfer Details (indicate cue type and reason): Assistance to maintain balance with L LE weakness, cues for sequencing and manuevering RW Toileting- Clothing Manipulation and Hygiene: Maximal assistance;Sit to/from stand Toileting - Clothing Manipulation Details (indicate cue type and reason): Max A for posterior hygiene in standing and brief mgmt. Pt able to assist in standing  by squatting and moving hips side to side       General ADL Comments: Improved alertness and participation in ADLs, motivated to increase independence      Vision   Vision Assessment?: Vision impaired- to be further tested in functional context Additional Comments: Difficulty reading clock and sign on wall in room accurately. Pt reports able to see it so unsure if vision vs cognition. Pt endorsed some blurriness   Perception     Praxis      Cognition Arousal/Alertness: Awake/alert Behavior During Therapy: WFL for tasks assessed/performed;Impulsive Overall Cognitive Status: Impaired/Different from baseline                                 General Comments: A&Ox4 today. Able to recall therapists' names and purpose of NG tube throughout session. Still some confusion at times (wondering where medications are, etc) but able to be redirected. Follows one step commands consistently with cues for safety, increasing awareness of deficits        Exercises     Shoulder Instructions       General Comments VSS on RA. Daughter present and supportive throughout. Pt pleasant and engaged during session. Motivated to be able to have NG tube removed.    Pertinent Vitals/ Pain       Pain Assessment: Faces Faces Pain Scale: No hurt Pain Intervention(s): Monitored during session  Home Living                                          Prior Functioning/Environment              Frequency  Min 2X/week        Progress Toward Goals  OT Goals(current goals can now be found in the care plan section)  Progress towards OT goals: Progressing toward goals  Acute Rehab OT Goals Patient Stated Goal: get NG tube out OT Goal Formulation: With patient Time For Goal Achievement: 09/07/20 Potential to Achieve Goals: Good ADL Goals Pt Will Perform Grooming: with min assist;sitting Pt Will Perform Lower Body Bathing: with supervision;sitting/lateral leans;sit to/from stand Pt Will Perform Lower Body Dressing: with supervision;sitting/lateral leans;sit to/from stand Pt Will Transfer to Toilet: with supervision;stand  pivot transfer;bedside commode Pt Will Perform Toileting - Clothing Manipulation and hygiene: with supervision;sitting/lateral leans;sit to/from stand Pt/caregiver will Perform Home Exercise Program: Increased strength;Increased ROM;Left upper extremity;With written HEP provided;With minimal assist Additional ADL Goal #2: pt will sustain focus on ADL task > 22min with no more than min cues.  Plan Discharge plan remains appropriate    Co-evaluation                 AM-PAC OT "6 Clicks" Daily Activity     Outcome Measure   Help from another person eating meals?: A Little Help from another person taking care of personal grooming?: A Little Help from another person toileting, which includes using toliet, bedpan, or urinal?: A Lot Help from another person bathing (including washing, rinsing, drying)?: A Lot Help from another person to put on and taking off regular upper body clothing?: A Lot Help from another person to put on and taking off regular lower body clothing?: A Lot 6 Click Score: 14    End of Session Equipment Utilized During Treatment: Gait belt;Rolling walker  OT Visit Diagnosis: Other abnormalities  of gait and mobility (R26.89);Muscle weakness (generalized) (M62.81);Other symptoms and signs involving cognitive function;Other symptoms and signs involving the nervous system (R29.898)   Activity Tolerance Patient tolerated treatment well   Patient Left in chair;with call bell/phone within reach;with chair alarm set;with family/visitor present   Nurse Communication Mobility status        Time: 4174-0814 OT Time Calculation (min): 46 min  Charges: OT General Charges $OT Visit: 1 Visit OT Treatments $Self Care/Home Management : 38-52 mins  Layla Maw, OTR/L   Layla Maw 08/30/2020, 9:05 AM

## 2020-08-30 NOTE — Consult Note (Signed)
Physical Medicine and Rehabilitation Consult Reason for Consult: Altered mental status Referring Physician: Dr. Kathyrn Sheriff   HPI: Amy Bruce is a 69 y.o. right-handed female with history of anxiety, cervical fusion 04/2020, obesity with BMI 32.68, prediabetes, fibromyalgia, hyperlipidemia, hypertension, PAD maintained on aspirin /Plavix, tobacco use.  Per chart review patient lives alone in [place].  Reportedly independent prior to admission.  She plans to stay with her daughter in [place] on discharge.  Presented 08/23/2020 after reported fall outside of her doctor's office when she lost her balance.  No reported loss of consciousness.  Cranial CT scan showed acute right cerebral convexity greater than been falcine subdural hematoma 12 mm leftward midline shift.  No definite ventricle trapping at that time.  CT cervical spine negative for acute fracture or subluxation.  CT abdomen pelvis no evidence of acute traumatic injury.  Admission chemistries urinalysis negative nitrite, urine drug screen negative, chemistries unremarkable except calcium 8.6, WBC 10,800.  Follow-up neurosurgery for SDH and underwent right frontal parietal craniotomy for evacuation of subdural hematoma 08/23/2020 per Dr. Kathyrn Sheriff.  EEG negative for seizure.  Maintained on Keppra for seizure prophylaxis.  Currently on mechanical soft diet thin liquids as well as initially receiving nasogastric tube feeds for nutritional support.  Due to patient's decreased functional mobility altered mental status physical medicine rehab consult was requested.   Pt reports using foley catheter; LBM today- had a very large BM today-  Per her daughter, will have 24/7 at home with combo of 3 daughters.   Says has HA- asking for pain meds Not eating well, so has Cortrak for nutrition  Review of Systems  Constitutional: Negative for chills and fever.  HENT: Negative for hearing loss.   Eyes: Negative for blurred vision and double  vision.  Respiratory: Negative for cough and shortness of breath.   Cardiovascular: Positive for leg swelling. Negative for chest pain and palpitations.  Gastrointestinal: Positive for constipation. Negative for heartburn, nausea and vomiting.       GERD  Genitourinary: Negative for dysuria, flank pain and hematuria.  Musculoskeletal: Positive for joint pain and myalgias.  Skin: Negative for rash.  Psychiatric/Behavioral:       Anxiety  All other systems reviewed and are negative.  Past Medical History:  Diagnosis Date  . Anemia    in the past  . Anxiety    yrs. ago panic attacks  . Arthritis   . Cancer ([place])    small in ovary - had total hysterestomy  . Complication of anesthesia    pt. reported her father became deaf after having anesthesia  . Fibromyalgia   . GERD (gastroesophageal reflux disease)   . Heart murmur    due to rheumatic fever, PCP no longer hears it  . Hyperlipidemia   . Hypertension   . Myalgia   . Neuropathy   . PAD (peripheral artery disease) ([place])   . PAD (peripheral artery disease) ([place])   . Pre-diabetes   . Pyelonephritis 09/10/2019    "septic"  . Tobacco abuse   . 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 ABDOMINAL AORTOGRAM W/LOWER EXTREMITY N/A 04/02/2018   Procedure: ABDOMINAL AORTOGRAM W/LOWER EXTREMITY;  Surgeon: Wellington Hampshire, MD;  Location: [facility];  Service: Cardiovascular;  Laterality: N/A;  . ABDOMINAL AORTOGRAM W/LOWER EXTREMITY N/A 09/23/2019   Procedure: ABDOMINAL AORTOGRAM W/LOWER EXTREMITY;  Surgeon: Wellington Hampshire, MD;  Location: [facility];  Service: Cardiovascular;  Laterality: N/A;  . ABDOMINAL HYSTERECTOMY  1999  . CERVICAL SPINE SURGERY  [id number]   C5/6 6/7  . COLONOSCOPY    . CRANIOTOMY Right 08/23/2020   Procedure: RIGHT CRANIOTOMY HEMATOMA EVACUATION SUBDURAL;  Surgeon: Consuella Lose, MD;  Location: [facility];  Service: Neurosurgery;   Laterality: Right;  . ECTOPIC PREGNANCY SURGERY    . Left axillary cyst removal 1989    . MULTIPLE EXTRACTIONS WITH ALVEOLOPLASTY N/A 10/25/2017   Procedure: EXTRACTION NUMBERS FIVE, SEVEN, EIGHT, NINE, TEN, FIFTEEN, EIGHTEEN WITH ALVEOLOPLASTY, IRRIGATION AND DEBRIDEMENT LEFT SUBMANDIBULAR INFECTION;  Surgeon: Diona Browner, DDS;  Location: [facility];  Service: Oral Surgery;  Laterality: N/A;  . PERIPHERAL VASCULAR ATHERECTOMY Right 09/23/2019   Procedure: PERIPHERAL VASCULAR ATHERECTOMY;  Surgeon: Wellington Hampshire, MD;  Location: [facility];  Service: Cardiovascular;  Laterality: Right;  SFA  . PERIPHERAL VASCULAR BALLOON ANGIOPLASTY Right 09/23/2019   Procedure: PERIPHERAL VASCULAR BALLOON ANGIOPLASTY;  Surgeon: Wellington Hampshire, MD;  Location: [facility];  Service: Cardiovascular;  Laterality: Right;  SFA  . RADIOLOGY WITH ANESTHESIA N/A 11/18/2018   Procedure: MRI OF THE CERVICAL SPINE WITHOUT CONTRAST;  Surgeon: Radiologist, Medication, MD;  Location: [facility];  Service: Radiology;  Laterality: N/A;  . RADIOLOGY WITH ANESTHESIA N/A 12/03/2019   Procedure: MRI WITH ANESTHESIA CERVICAL WITH AND WITHOUT CONTRAST;  Surgeon: Radiologist, Medication, MD;  Location: [facility];  Service: Radiology;  Laterality: N/A;  . TONSILECTOMY, ADENOIDECTOMY, BILATERAL MYRINGOTOMY AND TUBES  1981   put not aware of tubes being put in ears  . TUBAL LIGATION  1981   Family History  Problem Relation Age of Onset  . Ovarian cancer Mother    Social History:  reports that she has been smoking cigarettes. She has a 33.00 pack-year smoking history. She has never used smokeless tobacco. She reports current alcohol use of about 2.0 standard drinks of alcohol per week. She reports that she does not use drugs. Allergies:  Allergies  Allergen Reactions  . Carisoprodol Swelling  . Duloxetine Hcl Swelling and Hypertension   Medications Prior to Admission  Medication Sig Dispense Refill  . albuterol (VENTOLIN  HFA) 108 (90 Base) MCG/ACT inhaler INHALE 1 TO 2 PUFFS INTO THE LUNGS EVERY 4 HOURS AS NEEDED FOR WHEEZING (Patient taking differently: Inhale 1-2 puffs into the lungs every 4 (four) hours as needed for wheezing.) 8.5 g 5  . amitriptyline (ELAVIL) 50 MG tablet TAKE 1 TABLET(50 MG) BY MOUTH AT BEDTIME (Patient taking differently: Take 50 mg by mouth at bedtime.) 30 tablet 1  . aspirin EC 81 MG tablet Take 81 mg by mouth daily.    Marland Kitchen atorvastatin (LIPITOR) 80 MG tablet Take 80 mg by mouth daily.    . calcium carbonate (TUMS - DOSED IN MG ELEMENTAL CALCIUM) 500 MG chewable tablet Chew 2 tablets by mouth 2 (two) times daily as needed for indigestion or heartburn.     . Camphor-Eucalyptus-Menthol (VICKS VAPORUB EX) Apply 1 application topically daily as needed (congestion).    . clopidogrel (PLAVIX) 75 MG tablet Take 1 tablet (75 mg total) by mouth daily. 30 tablet 11  . cyclobenzaprine (FLEXERIL) 10 MG tablet Take 1 tablet (10 mg total) by mouth 3 (three) times daily as needed for muscle spasms. 90 tablet 1  . diazepam (VALIUM) 5 MG tablet Take 5 mg by mouth daily as needed for anxiety, muscle spasms or sedation.    . fexofenadine (ALLEGRA) 180  MG tablet Take 180 mg by mouth daily.    Marland Kitchen lidocaine (LIDODERM) 5 % UNWRAP AND APPLY 1 PATCH TO SKIN DAILY. REMOVE AND DISCARD PATCH WITHIN 12 HOURS OR AS DIRECTED BY MD. (Patient taking differently: Place 1 patch onto the skin daily as needed (pain.). UNWRAP AND APPLY 1 PATCH TO SKIN DAILY. REMOVE AND DISCARD PATCH WITHIN 12 HOURS OR AS DIRECTED BY MD.) 30 patch 1  . nicotine (NICODERM CQ - DOSED IN MG/24 HOURS) 21 mg/24hr patch Place 21 mg onto the skin daily as needed (smoking cessation).    . Omega-3 Fatty Acids (FISH OIL) 1000 MG CAPS Take 1 capsule by mouth daily.    Marland Kitchen oxyCODONE-acetaminophen (PERCOCET) 7.5-325 MG tablet Take 1 tablet by mouth every 4 (four) hours as needed for severe pain.    . pantoprazole (PROTONIX) 40 MG tablet Take 40 mg by mouth daily.     . pregabalin (LYRICA) 200 MG capsule TAKE 1 CAPSULE BY MOUTH EVERY MORNING, 1 CAPSULE AT NOON, AND 1 CAPSULE AT BEDTIME (Patient taking differently: Take 200 mg by mouth in the morning, at noon, and at bedtime.) 90 capsule 0  . tiZANidine (ZANAFLEX) 4 MG tablet TAKE 1/2 TABLET(2 MG) BY MOUTH THREE TIMES DAILY (Patient taking differently: Take 2 mg by mouth 3 (three) times daily.) 45 tablet 1  . vitamin B-12 (CYANOCOBALAMIN) 1000 MCG tablet Take 1,000 mcg by mouth daily.    . cilostazol (PLETAL) 100 MG tablet Take 100 mg by mouth 2 (two) times daily.    . diclofenac (VOLTAREN) 50 MG EC tablet Take 50 mg by mouth 3 (three) times daily.    . nicotine polacrilex (COMMIT) 2 MG lozenge Take 2 mg by mouth as needed for smoking cessation (Max 20 lozenges per day).       Home: Home Living Family/patient expects to be discharged to:: Private residence Living Arrangements: Alone Type of Home: House Additional Comments: unsure of PLOF or PMH, pt not conversant on eval.  Functional History: Prior Function Comments: unsure of PLOF Functional Status:  Mobility: Bed Mobility Overal bed mobility: Needs Assistance Bed Mobility: Supine to Sit Supine to sit: Mod assist,HOB elevated,+2 for physical assistance Sit to supine: Max assist,+2 for physical assistance,HOB elevated General bed mobility comments: Mod A x 2, able to advance B LE to EOB with cues (light assist for L LE). Increased assist for trunk Transfers Overall transfer level: Needs assistance Equipment used: Rolling walker (2 wheeled) Transfers: Sit to/from [employer] Sit to Stand: Mod assist,+2 physical assistance Stand pivot transfers: Mod assist,+2 physical assistance,+2 safety/equipment General transfer comment: Mod A x 2 for power up, cues for hand placement needed with each transition. Mod A x 2 for pivot > chair <> BSC with RW. Multimodal cues for sequencing and advancing RW Ambulation/Gait Ambulation/Gait assistance:  Max assist,+2 physical assistance Gait Distance (Feet): 4 Feet Assistive device: Rolling walker (2 wheeled) Gait Pattern/deviations: Step-to pattern,Decreased stride length,Decreased step length - left,Narrow base of support General Gait Details: small lateral steps with significant assist to maintain balance, manage RW, and cues for each step Gait velocity: decreased Gait velocity interpretation: <1.31 ft/sec, indicative of household ambulator    ADL: ADL Overall ADL's : Needs assistance/impaired Lower Body Dressing: Maximal assistance,Sit to/from stand Lower Body Dressing Details (indicate cue type and reason): Max A for doffing soiled brief and donning new one. Toilet Transfer: Moderate assistance,+2 for physical assistance,+2 for safety/equipment,Stand-pivot,BSC,RW Toilet Transfer Details (indicate cue type and reason): Assistance to maintain balance with  L LE weakness, cues for sequencing and manuevering RW Toileting- Clothing Manipulation and Hygiene: Maximal assistance,Sit to/from stand Toileting - Clothing Manipulation Details (indicate cue type and reason): Max A for posterior hygiene in standing and brief mgmt. Pt able to assist in standing by squatting and moving hips side to side General ADL Comments: Improved alertness and participation in ADLs, motivated to increase independence  Cognition: Cognition Overall Cognitive Status: Impaired/Different from baseline Arousal/Alertness: Awake/alert Orientation Level: Oriented to person,Oriented to time,Oriented to situation,Disoriented to place Attention: Focused,Sustained Focused Attention: Appears intact Sustained Attention: Impaired Sustained Attention Impairment: Verbal basic,Functional basic Memory: Impaired Memory Impairment: Decreased recall of new information,Decreased short term memory Decreased Short Term Memory: Verbal basic,Functional basic Awareness: Impaired Awareness Impairment: Intellectual impairment,Emergent  impairment Problem Solving: Impaired Problem Solving Impairment: Verbal basic,Functional basic [employer] Scales of Cognitive Functioning: Confused/inappropriate/non-agitated Cognition Arousal/Alertness: Awake/alert Behavior During Therapy: WFL for tasks assessed/performed,Impulsive Overall Cognitive Status: Impaired/Different from baseline General Comments: A&Ox4 today. Able to recall therapists' names and purpose of NG tube throughout session. Still some confusion at times (wondering where medications are, etc) but able to be redirected. Follows one step commands consistently with cues for safety, increasing awareness of deficits Difficult to assess due to: Level of arousal  Blood pressure (!) 124/59, pulse 74, temperature 98.2 F (36.8 C), temperature source Oral, resp. rate 18, height 5' (1.524 m), weight 75.9 kg, SpO2 97 %. Physical Exam Vitals and nursing note reviewed. Exam conducted with a chaperone present.  Constitutional:      Comments: Pt awake, alert, giddy/laughing a lot, but RN and daughter at bedside, appropriate/Ox3 otherwise, NAD  HENT:     Head:     Comments: Craniotomy site on R side of head- staples intact- looks ok- no erythema; cortrak in place Mild L facial droop- dentures in place    Right Ear: External ear normal.     Left Ear: External ear normal.     Nose: Nose normal. No congestion.     Mouth/Throat:     Mouth: Mucous membranes are dry.     Pharynx: Oropharynx is clear. No oropharyngeal exudate.     Comments: Tongue thickly coated with what appears to be thrush? Eyes:     General:        Right eye: No discharge.        Left eye: No discharge.     Extraocular Movements: Extraocular movements intact.     Comments: Muddy sclera B/L- no nystagmus  Cardiovascular:     Rate and Rhythm: Normal rate and regular rhythm.     Heart sounds: Normal heart sounds. No murmur heard.   Pulmonary:     Comments: CTA B/L- no W/R/R- good air  movement  Abdominal:     Comments: Soft, NT, ND, (+)BS ;  hypoactive  Genitourinary:    Comments: Has foley- very minimal color to urine Musculoskeletal:     Cervical back: Normal range of motion. No rigidity.     Comments: LEs-  HF on L 4+/5 and KE 4+/5; otherwise 5-/5 in B/L LEs in HF/KE/DF/PF B/L  Skin:    General: Skin is warm and dry.     Comments: Craniotomy site clean and dry Has an IV in R AC fossa- looks OK L wrist wrapped- said had watch that caused skin issues  Neurological:     Comments: Patient sitting up in chair with daughter at bedside.  Makes eye contact with examiner.  Follows simple commands.  Provides her name and  age.  She cannot recall full events of the fall. Cannot recall full hospitalization, however knows it's 08/30/2020 Giggling a lot/giddy/need to redirect focus Intact to light touch in all 4 extremities Knows Christmas in next holiday   Psychiatric:     Comments: Gleeful/bright     Results for orders placed or performed during the hospital encounter of 08/23/20 (from the past 24 hour(s))  Glucose, capillary     Status: Abnormal   Collection Time: 08/29/20  4:28 PM  Result Value Ref Range   Glucose-Capillary 140 (H) 70 - 99 mg/dL  Glucose, capillary     Status: Abnormal   Collection Time: 08/29/20  9:36 PM  Result Value Ref Range   Glucose-Capillary 150 (H) 70 - 99 mg/dL  Glucose, capillary     Status: Abnormal   Collection Time: 08/29/20 11:56 PM  Result Value Ref Range   Glucose-Capillary 163 (H) 70 - 99 mg/dL  Glucose, capillary     Status: Abnormal   Collection Time: 08/30/20  3:45 AM  Result Value Ref Range   Glucose-Capillary 115 (H) 70 - 99 mg/dL  Basic metabolic panel     Status: Abnormal   Collection Time: 08/30/20  4:23 AM  Result Value Ref Range   Sodium 138 135 - 145 mmol/L   Potassium 3.5 3.5 - 5.1 mmol/L   Chloride 101 98 - 111 mmol/L   CO2 26 22 - 32 mmol/L   Glucose, Bld 122 (H) 70 - 99 mg/dL   BUN 8 8 - 23 mg/dL    Creatinine, Ser 0.68 0.44 - 1.00 mg/dL   Calcium 9.0 8.9 - 10.3 mg/dL   GFR, Estimated >60 >60 mL/min   Anion gap 11 5 - 15  Phosphorus     Status: None   Collection Time: 08/30/20  4:23 AM  Result Value Ref Range   Phosphorus 3.6 2.5 - 4.6 mg/dL  Glucose, capillary     Status: Abnormal   Collection Time: 08/30/20  8:02 AM  Result Value Ref Range   Glucose-Capillary 162 (H) 70 - 99 mg/dL  Glucose, capillary     Status: Abnormal   Collection Time: 08/30/20 12:42 PM  Result Value Ref Range   Glucose-Capillary 141 (H) 70 - 99 mg/dL   No results found.   Assessment/Plan: Diagnosis: Fall causing R SDH s/p craniotomy 36mm R>L midline shift- with mild L sided weakness, urinary retention, and poor appetite 1. Does the need for close, 24 hr/day medical supervision in concert with the patient's rehab needs make it unreasonable for this patient to be served in a less intensive setting? Yes 2. Co-Morbidities requiring supervision/potential complications: HTN, prediabetes, anxiety, FMS, PAD, s/p R crani; poor intake/Cortrak 3. Due to bladder management, bowel management, safety, skin/wound care, disease management, medication administration, pain management and patient education, does the patient require 24 hr/day rehab nursing? Yes 4. Does the patient require coordinated care of a physician, rehab nurse, therapy disciplines of PT, OT and SLP to address physical and functional deficits in the context of the above medical diagnosis(es)? Yes Addressing deficits in the following areas: balance, endurance, locomotion, strength, transferring, bowel/bladder control, bathing, dressing, feeding, grooming, toileting, cognition and swallowing 5. Can the patient actively participate in an intensive therapy program of at least 3 hrs of therapy per day at least 5 days per week? Yes 6. The potential for patient to make measurable gains while on inpatient rehab is good 7. Anticipated functional outcomes upon  discharge from inpatient rehab are supervision  with PT, supervision with OT, supervision and min assist with SLP. 8. Estimated rehab length of stay to reach the above functional goals is: 10-14 days 9. Anticipated discharge destination: Home 10. Overall Rehab/Functional Prognosis: good  RECOMMENDATIONS: This patient's condition is appropriate for continued rehabilitative care in the following setting: [facility] Patient has agreed to participate in recommended program. Potentially Note that insurance prior authorization may be required for reimbursement for recommended care.  Comment: 1. Appears pt might have beginnings of thrush in mouth- suggest Nystatin Swish and swallow- maybe more.  2. Suggest checking KUB- first BM today "for awhile"- might be constipation as cause of poor appetite. If so, can address 3. She gave impression that headaches have been daily since Craniotomy- suggest Topamax 50 mg QHS for HA prevention? Less sedation than Elavil at this point.  4. Will submit for insurance/admissions coordinators to determine if can get into CIR.  5. Thank you for this consult.     Lavon Paganini Angiulli, PA-C 08/30/2020    I have personally performed a face to face diagnostic evaluation of this patient and formulated the key components of the plan.  Additionally, I have personally reviewed laboratory data, imaging studies, as well as relevant notes and concur with the physician assistant's documentation above.

## 2020-08-30 NOTE — Telephone Encounter (Signed)
Patient is currently admitted

## 2020-08-30 NOTE — Consult Note (Addendum)
Medical Consultation   Amy Bruce  HQP:[id number]  DOB: Jan 27, 1951  DOA: 08/23/2020  PCP: London Pepper, MD    Requesting physician:  Reason for consultation: Multiple medical problems   History of Present Illness:  Amy Bruce is a 69 yo BF w/ chronic back pain, HLD, tobacco use, PAD on aspirin, plavix presenting after fall. She is intubated currently. History obtained via chart review.  Amy Bruce was in her usual state of health until earlier today when she was coming out of her cardiologist's office when she slipped and fell on a metal pipe. She hit her head without loss of consciousness and was urgently brought to [facility] for evaluation. She began to be more confused, somnolent in the ED and was found to have subdural hemorrhage with midline shift on CT head. She was intubated and neurosurgery emergently brought her to OR for evacuation with Dr.Nundkumar.   12/21 afebrile overnight  Covid vaccination; vaccinated Review of Systems:  Review of Systems  Constitutional: Negative.   HENT: Negative.   Eyes: Negative.   Respiratory: Negative.   Cardiovascular: Negative.   Gastrointestinal: Negative.   Genitourinary: Negative.   Musculoskeletal: Negative.   Skin: Negative.   Neurological: Negative.   Endo/Heme/Allergies: Negative.   Psychiatric/Behavioral: Negative.      Past Medical History: Past Medical History:  Diagnosis Date  . Anemia    in the past  . Anxiety    yrs. ago panic attacks  . Arthritis   . 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 Complication of anesthesia    pt. reported her father became deaf after having anesthesia  . Fibromyalgia   . GERD (gastroesophageal reflux disease)   . Heart murmur    due to rheumatic fever, PCP no longer hears it  . Hyperlipidemia   . Hypertension   . Myalgia   . Neuropathy   . PAD (peripheral artery disease) ([place])   . PAD (peripheral artery disease) ([place])   . Pre-diabetes   .  Pyelonephritis 09/10/2019    "septic"  . Tobacco abuse   . 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 ABDOMINAL AORTOGRAM W/LOWER EXTREMITY N/A 04/02/2018   Procedure: ABDOMINAL AORTOGRAM W/LOWER EXTREMITY;  Surgeon: Wellington Hampshire, MD;  Location: [place] [facility];  Service: Cardiovascular;  Laterality: N/A;  . ABDOMINAL AORTOGRAM W/LOWER EXTREMITY N/A 09/23/2019   Procedure: ABDOMINAL AORTOGRAM W/LOWER EXTREMITY;  Surgeon: Wellington Hampshire, MD;  Location: [place] [facility];  Service: Cardiovascular;  Laterality: N/A;  . ABDOMINAL HYSTERECTOMY  1999  . CERVICAL SPINE SURGERY  [id number]   C5/6 6/7  . COLONOSCOPY    . CRANIOTOMY Right 08/23/2020   Procedure: RIGHT CRANIOTOMY HEMATOMA EVACUATION SUBDURAL;  Surgeon: Consuella Lose, MD;  Location: [place];  Service: Neurosurgery;  Laterality: Right;  . ECTOPIC PREGNANCY SURGERY    . Left axillary cyst removal 1989    . MULTIPLE EXTRACTIONS WITH ALVEOLOPLASTY N/A 10/25/2017   Procedure: EXTRACTION NUMBERS FIVE, SEVEN, EIGHT, NINE, TEN, FIFTEEN, EIGHTEEN WITH ALVEOLOPLASTY, IRRIGATION AND DEBRIDEMENT LEFT SUBMANDIBULAR INFECTION;  Surgeon: Diona Browner, DDS;  Location: [place];  Service: Oral Surgery;  Laterality: N/A;  . PERIPHERAL VASCULAR ATHERECTOMY Right 09/23/2019   Procedure: PERIPHERAL VASCULAR ATHERECTOMY;  Surgeon: Wellington Hampshire, MD;  Location: [place] [facility];  Service: Cardiovascular;  Laterality: Right;  SFA  . PERIPHERAL VASCULAR BALLOON ANGIOPLASTY Right 09/23/2019   Procedure: PERIPHERAL VASCULAR BALLOON ANGIOPLASTY;  Surgeon: Wellington Hampshire, MD;  Location: [place] [facility];  Service: Cardiovascular;  Laterality: Right;  SFA  . RADIOLOGY WITH ANESTHESIA N/A 11/18/2018   Procedure: MRI OF THE CERVICAL SPINE WITHOUT CONTRAST;  Surgeon: Radiologist, Medication, MD;  Location: [place];  Service: Radiology;  Laterality: N/A;  . RADIOLOGY  WITH ANESTHESIA N/A 12/03/2019   Procedure: MRI WITH ANESTHESIA CERVICAL WITH AND WITHOUT CONTRAST;  Surgeon: Radiologist, Medication, MD;  Location: [place];  Service: Radiology;  Laterality: N/A;  . TONSILECTOMY, ADENOIDECTOMY, BILATERAL MYRINGOTOMY AND TUBES  1981   put not aware of tubes being put in ears  . TUBAL LIGATION  1981     Allergies:   Allergies  Allergen Reactions  . Carisoprodol Swelling  . Duloxetine Hcl Swelling and Hypertension     Social History:  reports that she has been smoking cigarettes. She has a 33.00 pack-year smoking history. She has never used smokeless tobacco. She reports current alcohol use of about 2.0 standard drinks of alcohol per week. She reports that she does not use drugs.   Family History: Family History  Problem Relation Age of Onset  . Ovarian cancer Mother       Procedures/Significant Events:  08/23/20 R craniotomy and drain placement 12/15 extubated   I have personally reviewed and interpreted all radiology studies and my findings are as above.  VENTILATOR SETTINGS:   Cultures   Antimicrobials: Anti-infectives (From admission, onward)   Start     Dose/Rate Route Frequency Ordered Stop   08/23/20 2100  ceFAZolin (ANCEF) IVPB 2g/100 mL premix        2 g 200 mL/hr over 30 Minutes Intravenous Every 8 hours 08/23/20 1444 08/24/20 0709   08/23/20 1444  ceFAZolin (ANCEF) 3 g in dextrose 5 % 50 mL IVPB  Status:  Discontinued        3 g 100 mL/hr over 30 Minutes Intravenous 30 min pre-op 08/23/20 1444 08/23/20 1447       Devices    LINES / TUBES:      Continuous Infusions: . sodium chloride 75 mL/hr at 08/30/20 0058  . sodium chloride Stopped (08/24/20 1319)  . feeding supplement (OSMOLITE 1.2 CAL) 1,000 mL (08/29/20 1300)     Physical Exam: Vitals:   08/30/20 0318 08/30/20 0804 08/30/20 1153 08/30/20 1243  BP: 129/65 124/68 (!) 124/59   Pulse: 98 98 74   Resp: 20 18 18    Temp:    98.2 F (36.8 C)  TempSrc:   Oral  Oral  SpO2: 98% 97% 97%   Weight:      Height:        General: A/O x4, No acute respiratory distress Eyes: negative scleral hemorrhage, negative anisocoria, negative icterus ENT: Negative Runny nose, negative gingival bleeding, Neck:  Negative scars, masses, torticollis, lymphadenopathy, JVD Lungs: Clear to auscultation bilaterally without wheezes or crackles Cardiovascular: Regular rate and rhythm without murmur gallop or rub normal S1 and S2 Abdomen: negative abdominal pain, nondistended, positive soft, bowel sounds, no rebound, no ascites, no appreciable mass Extremities: No significant cyanosis, clubbing, or edema bilateral lower extremities Skin: Stable line along right frontal, parietal, occipital area dry and clean no sign of infection. Psychiatric:  Negative depression, negative anxiety, negative fatigue, negative mania  Central nervous system:  Cranial nerves II through XII intact, tongue/uvula midline, all extremities muscle strength 5/5, sensation  intact throughout, negative dysarthria, negative expressive aphasia, negative receptive aphasia.   Data reviewed:  I have personally reviewed following labs and imaging studies Labs:  CBC: Recent Labs  Lab 08/23/20 1603 08/24/20 0426 08/24/20 0505 08/25/20 0643 08/27/20 0719  WBC  --  10.8*  --  9.4 8.6  HGB 9.9* 10.8* 10.5* 11.1* 11.1*  HCT 29.0* 30.9* 31.0* 34.2* 32.6*  MCV  --  85.1  --  87.2 85.3  PLT  --  250  --  243 818    Basic Metabolic Panel: Recent Labs  Lab 08/24/20 0426 08/24/20 0505 08/25/20 0643 08/27/20 0429 08/28/20 0237 08/30/20 0423  NA 143 143 140 137 139 138  K 3.6 3.3* 3.5 5.8* 3.2* 3.5  CL 107  --  105 106 103 101  CO2 26  --  24 21* 24 26  GLUCOSE 126*  --  129* 132* 150* 122*  BUN 7*  --  6* 10 8 8   CREATININE 0.77  --  0.64 0.67 0.64 0.68  CALCIUM 8.6*  --  8.9 8.3* 8.9 9.0  MG 1.7  --  2.2 2.3  --   --   PHOS 2.8  --  3.1 1.7* 2.8 3.6   GFR Estimated Creatinine Clearance:  59.6 mL/min (by C-G formula based on SCr of 0.68 mg/dL). Liver Function Tests: No results for input(s): AST, ALT, ALKPHOS, BILITOT, PROT, ALBUMIN in the last 168 hours. No results for input(s): LIPASE, AMYLASE in the last 168 hours. No results for input(s): AMMONIA in the last 168 hours. Coagulation profile No results for input(s): INR, PROTIME in the last 168 hours.  Cardiac Enzymes: No results for input(s): CKTOTAL, CKMB, CKMBINDEX, TROPONINI in the last 168 hours. BNP: Invalid input(s): POCBNP CBG: Recent Labs  Lab 08/29/20 2136 08/29/20 2356 08/30/20 0345 08/30/20 0802 08/30/20 1242  GLUCAP 150* 163* 115* 162* 141*   D-Dimer No results for input(s): DDIMER in the last 72 hours. Hgb A1c No results for input(s): HGBA1C in the last 72 hours. Lipid Profile No results for input(s): CHOL, HDL, LDLCALC, TRIG, CHOLHDL, LDLDIRECT in the last 72 hours. Thyroid function studies No results for input(s): TSH, T4TOTAL, T3FREE, THYROIDAB in the last 72 hours.  Invalid input(s): FREET3 Anemia work up No results for input(s): VITAMINB12, FOLATE, FERRITIN, TIBC, IRON, RETICCTPCT in the last 72 hours. Urinalysis    Component Value Date/Time   COLORURINE YELLOW 08/23/2020 [place] 08/23/2020 1437   LABSPEC 1.036 (H) 08/23/2020 1437   PHURINE 6.0 08/23/2020 [place] 08/23/2020 1437   HGBUR NEGATIVE 08/23/2020 [place] 08/23/2020 [place] 08/23/2020 [place] 08/23/2020 1437   NITRITE NEGATIVE 08/23/2020 [place] 08/23/2020 1437     Microbiology Recent Results (from the past 240 hour(s))  Resp Panel by RT-PCR (Flu A&B, Covid) Nasopharyngeal Swab     Status: None   Collection Time: 08/23/20 11:22 AM   Specimen: Nasopharyngeal Swab; Nasopharyngeal(NP) swabs in vial transport medium  Result Value Ref Range Status   SARS Coronavirus 2 by RT PCR NEGATIVE NEGATIVE Final     Comment: (NOTE) SARS-CoV-2 target nucleic acids are NOT DETECTED.  The SARS-CoV-2 RNA is generally detectable in upper respiratory specimens during the acute phase of infection. The lowest concentration of SARS-CoV-2 viral copies this assay can detect is 138 copies/mL. A negative result does not preclude SARS-Cov-2 infection and should not be used as the sole basis  for treatment or other patient management decisions. A negative result may occur with  improper specimen collection/handling, submission of specimen other than nasopharyngeal swab, presence of viral mutation(s) within the areas targeted by this assay, and inadequate number of viral copies(<138 copies/mL). A negative result must be combined with clinical observations, patient history, and epidemiological information. The expected result is Negative.  Fact Sheet for Patients:  EntrepreneurPulse.com.au  Fact Sheet for Healthcare Providers:  IncredibleEmployment.be  This test is no t yet approved or cleared by the [place] FDA and  has been authorized for detection and/or diagnosis of SARS-CoV-2 by FDA under an Emergency Use Authorization (EUA). This EUA will remain  in effect (meaning this test can be used) for the duration of the COVID-19 declaration under Section 564(b)(1) of the Act, 21 U.S.C.section 360bbb-3(b)(1), unless the authorization is terminated  or revoked sooner.       Influenza A by PCR NEGATIVE NEGATIVE Final   Influenza B by PCR NEGATIVE NEGATIVE Final    Comment: (NOTE) The Xpert Xpress SARS-CoV-2/FLU/RSV plus assay is intended as an aid in the diagnosis of influenza from Nasopharyngeal swab specimens and should not be used as a sole basis for treatment. Nasal washings and aspirates are unacceptable for Xpert Xpress SARS-CoV-2/FLU/RSV testing.  Fact Sheet for Patients: EntrepreneurPulse.com.au  Fact Sheet for Healthcare  Providers: IncredibleEmployment.be  This test is not yet approved or cleared by the [place] FDA and has been authorized for detection and/or diagnosis of SARS-CoV-2 by FDA under an Emergency Use Authorization (EUA). This EUA will remain in effect (meaning this test can be used) for the duration of the COVID-19 declaration under Section 564(b)(1) of the Act, 21 U.S.C. section 360bbb-3(b)(1), unless the authorization is terminated or revoked.  Performed at [facility], [place] [address] [place], [place] [address]   MRSA PCR Screening     Status: None   Collection Time: 08/23/20  2:34 PM   Specimen: Nasal Mucosa; Nasopharyngeal  Result Value Ref Range Status   MRSA by PCR NEGATIVE NEGATIVE Final    Comment:        The GeneXpert MRSA Assay (FDA approved for NASAL specimens only), is one component of a comprehensive MRSA colonization surveillance program. It is not intended to diagnose MRSA infection nor to guide or monitor treatment for MRSA infections. Performed at [facility], [place] [address] [place], [place] [address]        Inpatient Medications:   Scheduled Meds: . amitriptyline  50 mg Per Tube QHS  . amLODipine  10 mg Per Tube Daily  . atorvastatin  80 mg Per Tube Daily  . bethanechol  10 mg Per Tube TID  . chlorhexidine  15 mL Mouth Rinse BID  . Chlorhexidine Gluconate Cloth  6 each Topical Daily  . feeding supplement (PROSource TF)  45 mL Per Tube Daily  . levETIRAcetam  1,000 mg Per Tube BID  . mouth rinse  15 mL Mouth Rinse q12n4p  . metoprolol tartrate  25 mg Per Tube BID  . pantoprazole sodium  40 mg Per Tube QHS  . pregabalin  100 mg Per Tube BID  . senna  1 tablet Per Tube BID   Continuous Infusions: . sodium chloride 75 mL/hr at 08/30/20 0058  . sodium chloride Stopped (08/24/20 1319)  . feeding supplement (OSMOLITE 1.2 CAL) 1,000 mL (08/29/20 1300)     Radiological Exams on Admission: No results  found.  Impression/Recommendations Active Problems:   Subdural hematoma (HCC)  RIGHT Subdural Hematoma -Per neurosurgery   Acute Encephalopathy - Secondary to large acute subdural hematoma -Resolved.  -Continue delirium precautions -PT/OT; recommend CIR  Acute on chronic pain-- -Currently well controlled we will make no changes to current medication.    Inadequate p.o. intake -Core track in place. -12/21 begin calorie count -Continue dysphagia 3 diet  PAD -Antiplatelets on hold  Essential HTN -Amlodipine 10 mg daily -Hydralazine PRN -Metoprolol 25 mg BID  Thank you for this consultation.  Our [facility] hospitalist team will follow the patient with you.   Time Spent: 29min  Dantae Meunier J M.D. [facility] 08/30/2020, 3:59 PM  [phone]

## 2020-08-30 NOTE — Progress Notes (Signed)
  NEUROSURGERY PROGRESS NOTE   No issues overnight. Pt sitting in bedside chair, daughter at bedside. No complaints.  EXAM:  BP 124/68 (BP Location: Left Arm)   Pulse 98   Temp 99.2 F (37.3 C) (Oral)   Resp 18   Ht 5' (1.524 m)   Wt 75.9 kg   SpO2 97%   BMI 32.68 kg/m   Awake, alert, oriented Answers questions appropriately Speech fluent CN grossly intact  MAE well Wound c/d/i   IMPRESSION:  69 y.o. female POD# 7 s/p right crani for SDH, much more awake, conversant today.  PLAN: - Cont supportive care - PMR eval, pt likely to move in with daughter in [place] after acute stay, would look at [facility] in [place].  Consuella Lose, MD [facility]

## 2020-08-30 NOTE — Care Management Important Message (Signed)
Important Message  Patient Details  Name: Amy Bruce MRN: [id number] Date of Birth: 1950/12/21   Medicare Important Message Given:  Yes     Orbie Pyo 08/30/2020, 3:14 PM

## 2020-08-30 NOTE — Progress Notes (Signed)
  Speech Language Pathology Treatment: Dysphagia;Cognitive-Linquistic  Patient Details Name: Amy Bruce MRN: [id number] DOB: [birth year] Today's Date: 08/30/2020 Time: 2353-6144 SLP Time Calculation (min) (ACUTE ONLY): 21 min  Assessment / Plan / Recommendation Clinical Impression  Pt demonstrates improved sustained attention, able to keep eyes open for social language and functional tasks. Reasoning and awareness gradually improving with more physical evidence. Pt continues to state that her injury was last year and at times believes she is at home. Gently touched pts hand to her staples to demonstrate that her injury is recent which helped her understand her situation. Pt much less tangential today and utilized basic problem solving for self feeding.  Though there is still severe dry mouth, pt was able to masticate solids without prolonged oral phase. Mild residue cleared with liquid wash. Will advance diet to Mech soft (dys3)/thin. Recommend CIR at d/c.   HPI HPI: Amy Bruce is a 69 yo F w/ PAD on DAPT admit after fall with trauma to head on metal pipe. Found to be altered with acute large subdural hemorrhage. Now s/ps crani with JP drain in place.  CT 12/15: "Interval evacuation of right subdural hematoma with surgical drain in place. Mild residual blood product within the subdural space seen both within the operative bed and along the falx as well as trace subarachnoid hemorrhage, unchanged. Markedly improved mass effect  with 4-5 mm residual right to left midline shift. No interval hemorrhage."  CXR 12/14: "Patchy bibasilar opacities, atelectasis versus infiltrates"      SLP Plan  Continue with current plan of care       Recommendations  Diet recommendations: Dysphagia 3 (mechanical soft);Thin liquid Liquids provided via: Cup;Straw Medication Administration: Whole meds with puree Supervision: Staff to assist with self feeding;Full supervision/cueing for compensatory  strategies Compensations: Slow rate;Small sips/bites Postural Changes and/or Swallow Maneuvers: Seated upright 90 degrees                Oral Care Recommendations: Oral care BID SLP Visit Diagnosis: Cognitive communication deficit (R15.400) Plan: Continue with current plan of care       GO               [name], MA [place] Pager [phone] Office [phone]  [name] 08/30/2020, 9:49 AM

## 2020-08-31 DIAGNOSIS — G9341 Metabolic encephalopathy: Secondary | ICD-10-CM

## 2020-08-31 DIAGNOSIS — I739 Peripheral vascular disease, unspecified: Secondary | ICD-10-CM

## 2020-08-31 LAB — COMPREHENSIVE METABOLIC PANEL
ALT: 23 U/L (ref 0–44)
AST: 17 U/L (ref 15–41)
Albumin: 2.4 g/dL — ABNORMAL LOW (ref 3.5–5.0)
Alkaline Phosphatase: 61 U/L (ref 38–126)
Anion gap: 8 (ref 5–15)
BUN: 10 mg/dL (ref 8–23)
CO2: 27 mmol/L (ref 22–32)
Calcium: 8.8 mg/dL — ABNORMAL LOW (ref 8.9–10.3)
Chloride: 102 mmol/L (ref 98–111)
Creatinine, Ser: 0.75 mg/dL (ref 0.44–1.00)
GFR, Estimated: >60 mL/min (ref >60)
Glucose, Bld: 134 mg/dL — ABNORMAL HIGH (ref 70–99)
Potassium: 3.6 mmol/L (ref 3.5–5.1)
Sodium: 137 mmol/L (ref 135–145)
Total Bilirubin: 0.1 mg/dL — ABNORMAL LOW (ref 0.3–1.2)
Total Protein: 5.6 g/dL — ABNORMAL LOW (ref 6.5–8.1)

## 2020-08-31 LAB — GLUCOSE, CAPILLARY
Glucose-Capillary: 115 mg/dL — ABNORMAL HIGH (ref 70–99)
Glucose-Capillary: 120 mg/dL — ABNORMAL HIGH (ref 70–99)
Glucose-Capillary: 124 mg/dL — ABNORMAL HIGH (ref 70–99)
Glucose-Capillary: 128 mg/dL — ABNORMAL HIGH (ref 70–99)
Glucose-Capillary: 137 mg/dL — ABNORMAL HIGH (ref 70–99)
Glucose-Capillary: 144 mg/dL — ABNORMAL HIGH (ref 70–99)
Glucose-Capillary: 152 mg/dL — ABNORMAL HIGH (ref 70–99)

## 2020-08-31 LAB — CBC WITH DIFFERENTIAL/PLATELET
Abs Immature Granulocytes: 0.09 10*3/uL — ABNORMAL HIGH (ref 0.00–0.07)
Basophils Absolute: 0 10*3/uL (ref 0.0–0.1)
Basophils Relative: 1 %
Eosinophils Absolute: 0.1 10*3/uL (ref 0.0–0.5)
Eosinophils Relative: 1 %
HCT: 28.1 % — ABNORMAL LOW (ref 36.0–46.0)
Hemoglobin: 9.5 g/dL — ABNORMAL LOW (ref 12.0–15.0)
Immature Granulocytes: 1 %
Lymphocytes Relative: 33 %
Lymphs Abs: 2.6 10*3/uL (ref 0.7–4.0)
MCH: 28.8 pg (ref 26.0–34.0)
MCHC: 33.8 g/dL (ref 30.0–36.0)
MCV: 85.2 fL (ref 80.0–100.0)
Monocytes Absolute: 1.1 10*3/uL — ABNORMAL HIGH (ref 0.1–1.0)
Monocytes Relative: 14 %
Neutro Abs: 4 10*3/uL (ref 1.7–7.7)
Neutrophils Relative %: 50 %
Platelets: 279 10*3/uL (ref 150–400)
RBC: 3.3 MIL/uL — ABNORMAL LOW (ref 3.87–5.11)
RDW: 16.4 % — ABNORMAL HIGH (ref 11.5–15.5)
WBC: 8 10*3/uL (ref 4.0–10.5)
nRBC: 0 % (ref 0.0–0.2)

## 2020-08-31 LAB — PHOSPHORUS: Phosphorus: 3.8 mg/dL (ref 2.5–4.6)

## 2020-08-31 LAB — MAGNESIUM: Magnesium: 2.2 mg/dL (ref 1.7–2.4)

## 2020-08-31 MED ORDER — OSMOLITE 1.2 CAL PO LIQD
720.0000 mL | ORAL | Status: DC
  Administered 2020-08-31: 20:00:00 720 mL
  Filled 2020-08-31 (×2): qty 948

## 2020-08-31 MED ORDER — OSMOLITE 1.2 CAL PO LIQD: 1000.0000 mL | ORAL | Status: DC

## 2020-08-31 NOTE — Consult Note (Signed)
Medical Consultation   Amy Bruce  ZDG:[id number]  DOB: Feb 01, 1951  DOA: 08/23/2020  PCP: London Pepper, MD    Requesting physician:  Reason for consultation: Multiple medical problems   History of Present Illness:  Amy Bruce is a 69 yo BF w/ chronic back pain, HLD, tobacco use, PAD on aspirin, plavix presenting after fall. She is intubated currently. History obtained via chart review.  Amy Bruce was in her usual state of health until earlier today when she was coming out of her cardiologist's office when she slipped and fell on a metal pipe. She hit her head without loss of consciousness and was urgently brought to [facility] for evaluation. She began to be more confused, somnolent in the ED and was found to have subdural hemorrhage with midline shift on CT head. She was intubated and neurosurgery emergently brought her to OR for evacuation with Dr.Nundkumar.   12/22 afebrile overnight patient states ate most of her breakfast, as well as lunch.  Covid vaccination; vaccinated Review of Systems:  Review of Systems  HENT: Negative.   Eyes: Negative.   Respiratory: Negative.   Cardiovascular: Negative.   Gastrointestinal: Negative.   Genitourinary: Negative.   Musculoskeletal: Negative.   Skin: Negative.   Neurological: Negative.   Endo/Heme/Allergies: Negative.   Psychiatric/Behavioral: Negative.      Past Medical History: Past Medical History:  Diagnosis Date  . Anemia    in the past  . Anxiety    yrs. ago panic attacks  . Arthritis   . Cancer ([place])    small in ovary - had total hysterestomy  . Complication of anesthesia    pt. reported her father became deaf after having anesthesia  . Fibromyalgia   . GERD (gastroesophageal reflux disease)   . Heart murmur    due to rheumatic fever, PCP no longer hears it  . Hyperlipidemia   . Hypertension   . Myalgia   . Neuropathy   . PAD (peripheral artery disease) ([place])   . PAD (peripheral artery disease)  ([place])   . Pre-diabetes   . Pyelonephritis 09/10/2019    "septic"  . Tobacco abuse   . 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 ABDOMINAL AORTOGRAM W/LOWER EXTREMITY N/A 04/02/2018   Procedure: ABDOMINAL AORTOGRAM W/LOWER EXTREMITY;  Surgeon: Wellington Hampshire, MD;  Location: [place] [facility];  Service: Cardiovascular;  Laterality: N/A;  . ABDOMINAL AORTOGRAM W/LOWER EXTREMITY N/A 09/23/2019   Procedure: ABDOMINAL AORTOGRAM W/LOWER EXTREMITY;  Surgeon: Wellington Hampshire, MD;  Location: [place] [facility];  Service: Cardiovascular;  Laterality: N/A;  . ABDOMINAL HYSTERECTOMY  1999  . CERVICAL SPINE SURGERY  [id number],[id number]   C5/6 6/7  . COLONOSCOPY    . CRANIOTOMY Right 08/23/2020   Procedure: RIGHT CRANIOTOMY HEMATOMA EVACUATION SUBDURAL;  Surgeon: Consuella Lose, MD;  Location: [place];  Service: Neurosurgery;  Laterality: Right;  . ECTOPIC PREGNANCY SURGERY    . Left axillary cyst removal 1989    . MULTIPLE EXTRACTIONS WITH ALVEOLOPLASTY N/A 10/25/2017   Procedure: EXTRACTION NUMBERS FIVE, SEVEN, EIGHT, NINE, TEN, FIFTEEN, EIGHTEEN WITH ALVEOLOPLASTY, IRRIGATION AND DEBRIDEMENT LEFT SUBMANDIBULAR INFECTION;  Surgeon: Diona Browner, DDS;  Location: [place];  Service: Oral Surgery;  Laterality: N/A;  . PERIPHERAL VASCULAR ATHERECTOMY Right 09/23/2019   Procedure: PERIPHERAL VASCULAR ATHERECTOMY;  Surgeon: Wellington Hampshire, MD;  [facility];  Service: Cardiovascular;  Laterality: Right;  SFA  . PERIPHERAL VASCULAR BALLOON ANGIOPLASTY Right 09/23/2019   Procedure: PERIPHERAL VASCULAR BALLOON ANGIOPLASTY;  Surgeon: Iran Ouch, MD;  [facility]: [facility];  Service: Cardiovascular;  Laterality: Right;  SFA  . RADIOLOGY WITH ANESTHESIA N/A 11/18/2018   Procedure: MRI OF THE CERVICAL SPINE WITHOUT CONTRAST;  Surgeon: Radiologist, Medication, MD;  Location: [facility];  Service: Radiology;   Laterality: N/A;  . RADIOLOGY WITH ANESTHESIA N/A 12/03/2019   Procedure: MRI WITH ANESTHESIA CERVICAL WITH AND WITHOUT CONTRAST;  Surgeon: Radiologist, Medication, MD;  Location: [facility];  Service: Radiology;  Laterality: N/A;  . TONSILECTOMY, ADENOIDECTOMY, BILATERAL MYRINGOTOMY AND TUBES  1981   put not aware of tubes being put in ears  . TUBAL LIGATION  1981     Allergies:   Allergies  Allergen Reactions  . Carisoprodol Swelling  . Duloxetine Hcl Swelling and Hypertension     Social History:  reports that she has been smoking cigarettes. She has a 33.00 pack-year smoking history. She has never used smokeless tobacco. She reports current alcohol use of about 2.0 standard drinks of alcohol per week. She reports that she does not use drugs.   Family History: Family History  Problem Relation Age of Onset  . Ovarian cancer Mother       Procedures/Significant Events:  08/23/20 R craniotomy and drain placement 12/15 extubated   I have personally reviewed and interpreted all radiology studies and my findings are as above.  VENTILATOR SETTINGS:   Cultures   Antimicrobials: Anti-infectives (From admission, onward)   Start     Ordered Stop   08/23/20 2100  ceFAZolin (ANCEF) IVPB 2g/100 mL premix        08/23/20 1444 08/24/20 0709   08/23/20 1444  ceFAZolin (ANCEF) 3 g in dextrose 5 % 50 mL IVPB  Status:  Discontinued        08/23/20 1444 08/23/20 1447       Devices    LINES / TUBES:      Continuous Infusions: . sodium chloride 75 mL/hr at 08/30/20 0058  . sodium chloride Stopped (08/24/20 1319)  . feeding supplement (OSMOLITE 1.2 CAL) 1,000 mL (08/30/20 2311)     Physical Exam: Vitals:   08/30/20 1614 08/30/20 2010 08/31/20 0018 08/31/20 0311  BP: (!) 118/52 117/74 (!) 101/48 (!) 115/48  Pulse: 72 93 72 78  Resp: 11 13 16 16   Temp: 98.1 F (36.7 C) 99.1 F (37.3 C) 98.1 F (36.7 C)   TempSrc:   Oral   SpO2: 99% 99% 95% 95%  Weight:      Height:         General: A/O x4, No acute respiratory distress Eyes: negative scleral hemorrhage, negative anisocoria, negative icterus ENT: Negative Runny nose, negative gingival bleeding, Neck:  Negative scars, masses, torticollis, lymphadenopathy, JVD Lungs: Clear to auscultation bilaterally without wheezes or crackles Cardiovascular: Regular rate and rhythm without murmur gallop or rub normal S1 and S2 Abdomen: negative abdominal pain, nondistended, positive soft, bowel sounds, no rebound, no ascites, no appreciable mass Extremities: No significant cyanosis, clubbing, or edema bilateral lower extremities Skin: Stable line along right frontal, parietal, occipital area dry and clean no sign of infection. Psychiatric:  Negative depression, negative anxiety, negative fatigue, negative mania  Central nervous system:  Cranial nerves II through XII intact, tongue/uvula midline, all extremities muscle strength 5/5, sensation intact throughout, negative dysarthria, negative expressive aphasia, negative receptive aphasia.  Data reviewed:  I have personally reviewed following labs and imaging studies Labs:  CBC: Recent Labs  Lab 08/25/20 0643 08/27/20 0719 08/31/20 0135  WBC 9.4 8.6 8.0  NEUTROABS  --   --  4.0  HGB 11.1* 11.1* 9.5*  HCT 34.2* 32.6* 28.1*  MCV 87.2 85.3 85.2  PLT 243 231 279    Basic Metabolic Panel: Recent Labs  Lab 08/25/20 0643 08/27/20 0429 08/28/20 0237 08/30/20 0423 08/31/20 0135  NA 140 137 139 138 137  K 3.5 5.8* 3.2* 3.5 3.6  CL 105 106 103 101 102  CO2 24 21* 24 26 27   GLUCOSE 129* 132* 150* 122* 134*  BUN 6* 10 8 8 10   CREATININE 0.64 0.67 0.64 0.68 0.75  CALCIUM 8.9 8.3* 8.9 9.0 8.8*  MG 2.2 2.3  --   --  2.2  PHOS 3.1 1.7* 2.8 3.6 3.8   GFR Estimated Creatinine Clearance: 59.6 mL/min (by C-G formula based on SCr of 0.75 mg/dL). Liver Function Tests: Recent Labs  Lab 08/31/20 0135  AST 17  ALT 23  ALKPHOS 61  BILITOT 0.1*  PROT 5.6*  ALBUMIN  2.4*   No results for input(s): LIPASE, AMYLASE in the last 168 hours. No results for input(s): AMMONIA in the last 168 hours. Coagulation profile No results for input(s): INR, PROTIME in the last 168 hours.  Cardiac Enzymes: No results for input(s): CKTOTAL, CKMB, CKMBINDEX, TROPONINI in the last 168 hours. BNP: Invalid input(s): POCBNP CBG: Recent Labs  Lab 08/30/20 1611 08/30/20 2006 08/31/20 0017 08/31/20 0320 08/31/20 0815  GLUCAP 118* 106* 152* 124* 144*   D-Dimer No results for input(s): DDIMER in the last 72 hours. Hgb A1c No results for input(s): HGBA1C in the last 72 hours. Lipid Profile No results for input(s): CHOL, HDL, LDLCALC, TRIG, CHOLHDL, LDLDIRECT in the last 72 hours. Thyroid function studies No results for input(s): TSH, T4TOTAL, T3FREE, THYROIDAB in the last 72 hours.  Invalid input(s): FREET3 Anemia work up No results for input(s): VITAMINB12, FOLATE, FERRITIN, TIBC, IRON, RETICCTPCT in the last 72 hours. Urinalysis    Component Value Date/Time   COLORURINE YELLOW 08/23/2020 1437   APPEARANCEUR CLEAR 08/23/2020 1437   LABSPEC 1.036 (H) 08/23/2020 1437   PHURINE 6.0 08/23/2020 1437   GLUCOSEU NEGATIVE 08/23/2020 1437   HGBUR NEGATIVE 08/23/2020 1437   BILIRUBINUR NEGATIVE 08/23/2020 1437   KETONESUR NEGATIVE 08/23/2020 1437   PROTEINUR NEGATIVE 08/23/2020 1437   NITRITE NEGATIVE 08/23/2020 1437   LEUKOCYTESUR NEGATIVE 08/23/2020 1437     Microbiology Recent Results (from the past 240 hour(s))  Resp Panel by RT-PCR (Flu A&B, Covid) Nasopharyngeal Swab     Status: None   Collection Time: 08/23/20 11:22 AM   Specimen: Nasopharyngeal Swab; Nasopharyngeal(NP) swabs in vial transport medium  Result Value Ref Range Status   SARS Coronavirus 2 by RT PCR NEGATIVE NEGATIVE Final    Comment: (NOTE) SARS-CoV-2 target nucleic acids are NOT DETECTED.  The SARS-CoV-2 RNA is generally detectable in upper respiratory specimens during the acute phase  of infection. The lowest concentration of SARS-CoV-2 viral copies this assay can detect is 138 copies/mL. A negative result does not preclude SARS-Cov-2 infection and should not be used as the sole basis for treatment or other patient management decisions. A negative result may occur with  improper specimen collection/handling, submission of specimen other than nasopharyngeal swab, presence of viral mutation(s) within the areas targeted by this assay, and inadequate number of viral copies(<138 copies/mL). A negative result must  be combined with clinical observations, patient history, and epidemiological information. The expected result is Negative.  Fact Sheet for Patients:  EntrepreneurPulse.com.au  Fact Sheet for Healthcare Providers:  IncredibleEmployment.be  This test is no t yet approved or cleared by the [place] FDA and  has been authorized for detection and/or diagnosis of SARS-CoV-2 by FDA under an Emergency Use Authorization (EUA). This EUA will remain  in effect (meaning this test can be used) for the duration of the COVID-19 declaration under Section 564(b)(1) of the Act, 21 U.S.C.section 360bbb-3(b)(1), unless the authorization is terminated  or revoked sooner.       Influenza A by PCR NEGATIVE NEGATIVE Final   Influenza B by PCR NEGATIVE NEGATIVE Final    Comment: (NOTE) The Xpert Xpress SARS-CoV-2/FLU/RSV plus assay is intended as an aid in the diagnosis of influenza from Nasopharyngeal swab specimens and should not be used as a sole basis for treatment. Nasal washings and aspirates are unacceptable for Xpert Xpress SARS-CoV-2/FLU/RSV testing.  Fact Sheet for Patients: EntrepreneurPulse.com.au  Fact Sheet for Healthcare Providers: IncredibleEmployment.be  This test is not yet approved or cleared by the [place] FDA and has been authorized for detection and/or diagnosis of  SARS-CoV-2 by FDA under an Emergency Use Authorization (EUA). This EUA will remain in effect (meaning this test can be used) for the duration of the COVID-19 declaration under Section 564(b)(1) of the Act, 21 U.S.C. section 360bbb-3(b)(1), unless the authorization is terminated or revoked.  Performed at [facility], [place] [address] [place], [place] [address]   MRSA PCR Screening     Status: None   Collection Time: 08/23/20  2:34 PM   Specimen: Nasal Mucosa; Nasopharyngeal  Result Value Ref Range Status   MRSA by PCR NEGATIVE NEGATIVE Final    Comment:        The GeneXpert MRSA Assay (FDA approved for NASAL specimens only), is one component of a comprehensive MRSA colonization surveillance program. It is not intended to diagnose MRSA infection nor to guide or monitor treatment for MRSA infections. Performed at [facility], [place] [address] [place],  [address]        Inpatient Medications:   Scheduled Meds: . amitriptyline  50 mg Per Tube QHS  . amLODipine  10 mg Per Tube Daily  . atorvastatin  80 mg Per Tube Daily  . bethanechol  10 mg Per Tube TID  . chlorhexidine  15 mL Mouth Rinse BID  . Chlorhexidine Gluconate Cloth  6 each Topical Daily  . feeding supplement (PROSource TF)  45 mL Per Tube Daily  . levETIRAcetam  1,000 mg Per Tube BID  . mouth rinse  15 mL Mouth Rinse q12n4p  . metoprolol tartrate  25 mg Per Tube BID  . pantoprazole sodium  40 mg Per Tube QHS  . pregabalin  100 mg Per Tube BID  . senna  1 tablet Per Tube BID   Continuous Infusions: . sodium chloride 75 mL/hr at 08/30/20 0058  . sodium chloride Stopped (08/24/20 1319)  . feeding supplement (OSMOLITE 1.2 CAL) 1,000 mL (08/30/20 2311)     Radiological Exams on Admission: No results found.  Impression/Recommendations Active Problems:   Subdural hematoma (HCC)  RIGHT Subdural Hematoma -Per neurosurgery   Acute Encephalopathy - Secondary to large acute subdural  hematoma -Resolved.  -Continue delirium precautions -PT/OT; recommend CIR  Acute on chronic pain-- -Currently well controlled we will make no changes to current medication.    Inadequate p.o.  intake -Core track in place. -12/21 begin calorie count -Continue dysphagia 3 diet -12/22 patient continues to consume her meals as reported most likely will pass her calorie count and be able to remove her core track tube  PAD -Antiplatelets on hold  Essential HTN -Amlodipine 10 mg daily -Hydralazine PRN -Metoprolol 25 mg BID  Thank you for this consultation.  Our [facility] hospitalist team will follow the patient with you.   Time Spent:  Consandra Laske J M.D. [facility] 08/31/2020, 8:52 AM  [phone]

## 2020-08-31 NOTE — Progress Notes (Signed)
Inpatient Rehabilitation Admissions Coordinator  I me with patient with her youngest daughter from [place] at bedside and conference called with daughter Rogers [employer]eds in [place]. We discussed goals and expectations of a possible Cir admit . I discussed two avenues for rehab 1) CIR here at [facility] and then to one of the daughters home in either [place] or [place] after rehab complete, 2) Rehab in [place] directly from [place]. They prefer [facility] then they will discuss patient either to go to [facility] or [place] based on her functional recovery and need for 24/7 care at that time. I have begun insurance 12/21 and will await [facility] Medicare determination.  Danne Baxter, RN, MSN Rehab Admissions Coordinator [phone] 08/31/2020 1:56 PM

## 2020-08-31 NOTE — Progress Notes (Signed)
Nutrition Follow-up  DOCUMENTATION CODES:   Obesity unspecified  INTERVENTION:   -Initiate 48 hour calorie count -Transition to nocturnal feedings:  Osmolite 1.2 at 60 ml/hr x 12 hours (ex 2000-0800)  Provides 864 kcal, 40 gm protein, 584 ml free water daily  NUTRITION DIAGNOSIS:   Inadequate oral intake related to inability to eat as evidenced by NPO status.  Ongoing  GOAL:   Patient will meet greater than or equal to 90% of their needs  Met with TF  MONITOR:   TF tolerance  REASON FOR ASSESSMENT:   Consult Enteral/tube feeding initiation and management  ASSESSMENT:   Pt with PMH of PVD, PAD, HLD, HTN, chronic pain, and GERD admitted after fall with acute large SDH s/p R craniotomy for evacuation.  12/20- advanced to dysphagia 1 diet with thin liquids 12/21- advanced to dysphagia 3 diet with thin liquids  Reviewed I/O's: -1.9 L x 24 hours and -1.3 L since admission  UOP: 1.9 L x 24 hours  Attempted to speak with pt x 2, however, unavailable at times of visits.   Pt continue to receive TF via cortrak (Osmolite 1.2 @ 60 ml/hr and 45 ml Prosource TF daily). Regimen provides 1768 kcals, 90 grams protein, and 1167 free water daily. Now that pt is on a PO diet, will transition to nocturnal feedings to help stimulate appetite.   Calorie count has been ordered: 12/22 Breakfast: 436 kcals, 10 grams protein Lunch: 428 kcals, 18 grams protein  Medications reviewed and include folvite and senokot.   Labs reviewed: CBGS: 115-137.   Diet Order:   Diet Order            DIET DYS 3 Room service appropriate? Yes; Fluid consistency: Thin  Diet effective now                 EDUCATION NEEDS:   No education needs have been identified at this time  Skin:  Skin Assessment: Skin Integrity Issues: Skin Integrity Issues:: Incisions Incisions: closed rt head  Last BM:  08/31/20  Height:   Ht Readings from Last 1 Encounters:  08/23/20 5' (1.524 m)    Weight:    Wt Readings from Last 1 Encounters:  08/28/20 75.9 kg    Ideal Body Weight:  45.4 kg  BMI:  Body mass index is 32.68 kg/m.  Estimated Nutritional Needs:   Kcal:  1700-1900  Protein:  85-100 grams  Fluid:  > 1.7 L/day    Loistine Chance, RD, LDN, [place] Registered Dietitian II Certified Diabetes Care and Education Specialist Please refer to [facility] for RD and/or RD on-call/weekend/after hours pager

## 2020-08-31 NOTE — Progress Notes (Signed)
Physical Therapy Treatment Patient Details Name: Amy Bruce MRN: [id number] DOB: 1951-07-16 Today's Date: 08/31/2020    History of Present Illness 69 yo female admitted to ED On 12/14 after fall at doctor's office. CT demonstrated large right frontoparietal SDH with ~17mm R>L MLS and effacement of the right lateral ventricle, and early transtentorial uncal herniation; s/p R frontoparietal craniotomy for evacuation of SDH. PMH includes cervical fusion 04/2020, tobacco abuse, HTN, PAD, anxiety, fibromyalgia, ovarian cancer, pre-DM.    PT Comments    Pt eager to mobilize today, very cheery and pleasant for duration of session. Pt requiring min-mod assist for bed-level and OOB mobility today, pt with impaired standing balance and LE weakness noted during gait. Pt also limited by impulsivity, requires max safety cues throughout session. PT continuing to recommend IPR, will continue to follow acutely.     Follow Up Recommendations  Supervision/Assistance - 24 hour;CIR (pending progress and family support)     Equipment Recommendations   (defer to post acuts)    Recommendations for Other Services       Precautions / Restrictions Precautions Precautions: Fall Precaution Comments: cortrak, R crani Restrictions Weight Bearing Restrictions: No    Mobility  Bed Mobility Overal bed mobility: Needs Assistance Bed Mobility: Supine to Sit     Supine to sit: Min assist     General bed mobility comments: min assist for trunk elevation, scooting to EOB. pt moves LEs to EOB without PT assist  Transfers Overall transfer level: Needs assistance Equipment used: Rolling walker (2 wheeled) Transfers: Sit to/from Stand Sit to Stand: Min assist;+2 safety/equipment         General transfer comment: min assist x2 from EOB And from chair in hallway, assist for power up and steadying, as well as hand placement.  Ambulation/Gait Ambulation/Gait assistance: Min assist;+2  safety/equipment Gait Distance (Feet): 100 Feet (x2) Assistive device: Rolling walker (2 wheeled) Gait Pattern/deviations: Step-through pattern;Decreased stride length;Trunk flexed;Drifts right/left;Staggering right Gait velocity: decr   General Gait Details: min-mod assist to steady, physically manage RW from drifting R, slow pt forward progression, and steady during near LOB x2. Seated rest break x1 during gait, unsafe and fast descent into seated.   Stairs             Wheelchair Mobility    Modified Rankin (Stroke Patients Only) Modified Rankin (Stroke Patients Only) Pre-Morbid Rankin Score: No significant disability Modified Rankin: Moderately severe disability     Balance Overall balance assessment: Needs assistance Sitting-balance support: Single extremity supported;Feet supported;Bilateral upper extremity supported Sitting balance-Leahy Scale: Fair Sitting balance - Comments: able to statically sit, min guard for safety   Standing balance support: Bilateral upper extremity supported;During functional activity Standing balance-Leahy Scale: Poor Standing balance comment: reliant on B UE support                            Cognition Arousal/Alertness: Awake/alert Behavior During Therapy: WFL for tasks assessed/performed;Impulsive Overall Cognitive Status: Impaired/Different from baseline                                 General Comments: A&Ox4, very cheery throughout session. Pt is impulsive and requires cues to slow down especially with RW use. At one point, pt let go of her RW and continued to walk while trying to fix her mask, + LOB requiring PT correction.      Exercises  General Comments        Pertinent Vitals/Pain Pain Assessment: Faces Faces Pain Scale: Hurts a little bit Pain Location: head Pain Descriptors / Indicators: Headache Pain Intervention(s): Limited activity within patient's tolerance;Monitored during  session;Repositioned    Home Living                      Prior Function            PT Goals (current goals can now be found in the care plan section) Acute Rehab PT Goals Time For Goal Achievement: 09/07/20 Potential to Achieve Goals: Fair Progress towards PT goals: Progressing toward goals    Frequency    Min 4X/week      PT Plan Current plan remains appropriate    Co-evaluation              AM-PAC PT "6 Clicks" Mobility   Outcome Measure  Help needed turning from your back to your side while in a flat bed without using bedrails?: A Little Help needed moving from lying on your back to sitting on the side of a flat bed without using bedrails?: A Lot Help needed moving to and from a bed to a chair (including a wheelchair)?: A Lot Help needed standing up from a chair using your arms (e.g., wheelchair or bedside chair)?: A Little Help needed to walk in hospital room?: A Lot Help needed climbing 3-5 steps with a railing? : A Lot 6 Click Score: 14    End of Session Equipment Utilized During Treatment: Gait belt Activity Tolerance: Patient tolerated treatment well;Patient limited by fatigue Patient left: with call bell/phone within reach;in chair;with chair alarm set;with family/visitor present Nurse Communication: Mobility status PT Visit Diagnosis: Other abnormalities of gait and mobility (R26.89);Other symptoms and signs involving the nervous system (R29.898);History of falling (Z91.81)     Time: 1001-1031 PT Time Calculation (min) (ACUTE ONLY): 30 min  Charges:  $Gait Training: 8-22 mins $Therapeutic Activity: 8-22 mins                     Stacie Glaze, PT Acute Rehabilitation Services Pager [phone]  Office [phone]    [place] 08/31/2020, 1:56 PM

## 2020-08-31 NOTE — Progress Notes (Signed)
  NEUROSURGERY PROGRESS NOTE   No issues overnight. Pt sitting in bedside chair, no complaints. Minimal HA.  EXAM:  BP 123/74 (BP Location: Left Arm)   Pulse 75   Temp 98 F (36.7 C) (Oral)   Resp 19   Ht 5' (1.524 m)   Wt 75.9 kg   SpO2 99%   BMI 32.68 kg/m   Awake, alert, oriented  Speech fluent, appropriate  CN grossly intact  5/5 BUE/BLE   IMPRESSION:  69 y.o. female POD# 8 s/p right crani for SDH after fall. Recovering well.  PLAN: - CIR pending insurance approval and bed availability - Cont PT/OT - Hold ASA/Plavix (outpatient meds for PVD)   Consuella Lose, MD [facility]

## 2020-09-01 ENCOUNTER — Other Ambulatory Visit: Payer: Self-pay

## 2020-09-01 ENCOUNTER — Inpatient Hospital Stay (HOSPITAL_COMMUNITY)
Admission: RE | Admit: 2020-09-01 | Discharge: 2020-09-17 | DRG: 093 | Disposition: A | Discharge status: Home / Self Care | Payer: Medicare Other | Source: Intra-hospital | Attending: Physical Medicine & Rehabilitation | Admitting: Physical Medicine & Rehabilitation

## 2020-09-01 ENCOUNTER — Encounter (HOSPITAL_COMMUNITY): Payer: Self-pay | Admitting: Physical Medicine & Rehabilitation

## 2020-09-01 DIAGNOSIS — R42 Dizziness and giddiness: Secondary | ICD-10-CM | POA: Diagnosis present

## 2020-09-01 DIAGNOSIS — Z8543 Personal history of malignant neoplasm of ovary: Secondary | ICD-10-CM

## 2020-09-01 DIAGNOSIS — Z716 Tobacco abuse counseling: Secondary | ICD-10-CM

## 2020-09-01 DIAGNOSIS — Z8041 Family history of malignant neoplasm of ovary: Secondary | ICD-10-CM | POA: Diagnosis not present

## 2020-09-01 DIAGNOSIS — I739 Peripheral vascular disease, unspecified: Secondary | ICD-10-CM | POA: Diagnosis present

## 2020-09-01 DIAGNOSIS — E785 Hyperlipidemia, unspecified: Secondary | ICD-10-CM | POA: Diagnosis present

## 2020-09-01 DIAGNOSIS — W19XXXS Unspecified fall, sequela: Secondary | ICD-10-CM | POA: Diagnosis present

## 2020-09-01 DIAGNOSIS — M1811 Unilateral primary osteoarthritis of first carpometacarpal joint, right hand: Secondary | ICD-10-CM | POA: Diagnosis present

## 2020-09-01 DIAGNOSIS — S065X3D Traumatic subdural hemorrhage with loss of consciousness of 1 hour to 5 hours 59 minutes, subsequent encounter: Secondary | ICD-10-CM | POA: Diagnosis not present

## 2020-09-01 DIAGNOSIS — S065X1S Traumatic subdural hemorrhage with loss of consciousness of 30 minutes or less, sequela: Secondary | ICD-10-CM | POA: Diagnosis not present

## 2020-09-01 DIAGNOSIS — Z90710 Acquired absence of both cervix and uterus: Secondary | ICD-10-CM

## 2020-09-01 DIAGNOSIS — R52 Pain, unspecified: Secondary | ICD-10-CM

## 2020-09-01 DIAGNOSIS — Z6832 Body mass index (BMI) 32.0-32.9, adult: Secondary | ICD-10-CM | POA: Diagnosis not present

## 2020-09-01 DIAGNOSIS — R1310 Dysphagia, unspecified: Secondary | ICD-10-CM | POA: Diagnosis present

## 2020-09-01 DIAGNOSIS — Z7982 Long term (current) use of aspirin: Secondary | ICD-10-CM

## 2020-09-01 DIAGNOSIS — S065X0A Traumatic subdural hemorrhage without loss of consciousness, initial encounter: Secondary | ICD-10-CM | POA: Diagnosis not present

## 2020-09-01 DIAGNOSIS — Z981 Arthrodesis status: Secondary | ICD-10-CM

## 2020-09-01 DIAGNOSIS — S065XAA Traumatic subdural hemorrhage with loss of consciousness status unknown, initial encounter: Secondary | ICD-10-CM | POA: Diagnosis present

## 2020-09-01 DIAGNOSIS — F419 Anxiety disorder, unspecified: Secondary | ICD-10-CM | POA: Diagnosis present

## 2020-09-01 DIAGNOSIS — I10 Essential (primary) hypertension: Secondary | ICD-10-CM | POA: Diagnosis present

## 2020-09-01 DIAGNOSIS — K5900 Constipation, unspecified: Secondary | ICD-10-CM | POA: Diagnosis not present

## 2020-09-01 DIAGNOSIS — G479 Sleep disorder, unspecified: Secondary | ICD-10-CM | POA: Diagnosis not present

## 2020-09-01 DIAGNOSIS — R682 Dry mouth, unspecified: Secondary | ICD-10-CM | POA: Diagnosis not present

## 2020-09-01 DIAGNOSIS — M797 Fibromyalgia: Secondary | ICD-10-CM | POA: Diagnosis present

## 2020-09-01 DIAGNOSIS — G441 Vascular headache, not elsewhere classified: Secondary | ICD-10-CM | POA: Diagnosis present

## 2020-09-01 DIAGNOSIS — Z7902 Long term (current) use of antithrombotics/antiplatelets: Secondary | ICD-10-CM

## 2020-09-01 DIAGNOSIS — G4700 Insomnia, unspecified: Secondary | ICD-10-CM | POA: Diagnosis present

## 2020-09-01 DIAGNOSIS — F17210 Nicotine dependence, cigarettes, uncomplicated: Secondary | ICD-10-CM | POA: Diagnosis present

## 2020-09-01 DIAGNOSIS — R339 Retention of urine, unspecified: Secondary | ICD-10-CM | POA: Diagnosis present

## 2020-09-01 DIAGNOSIS — K219 Gastro-esophageal reflux disease without esophagitis: Secondary | ICD-10-CM | POA: Diagnosis present

## 2020-09-01 DIAGNOSIS — R4781 Slurred speech: Secondary | ICD-10-CM | POA: Diagnosis present

## 2020-09-01 DIAGNOSIS — S065X0D Traumatic subdural hemorrhage without loss of consciousness, subsequent encounter: Secondary | ICD-10-CM | POA: Diagnosis not present

## 2020-09-01 DIAGNOSIS — R29810 Facial weakness: Secondary | ICD-10-CM | POA: Diagnosis present

## 2020-09-01 DIAGNOSIS — S065X9A Traumatic subdural hemorrhage with loss of consciousness of unspecified duration, initial encounter: Secondary | ICD-10-CM | POA: Diagnosis present

## 2020-09-01 DIAGNOSIS — R471 Dysarthria and anarthria: Secondary | ICD-10-CM | POA: Diagnosis present

## 2020-09-01 DIAGNOSIS — Z79899 Other long term (current) drug therapy: Secondary | ICD-10-CM

## 2020-09-01 DIAGNOSIS — S065X0S Traumatic subdural hemorrhage without loss of consciousness, sequela: Principal | ICD-10-CM

## 2020-09-01 DIAGNOSIS — R7303 Prediabetes: Secondary | ICD-10-CM | POA: Diagnosis present

## 2020-09-01 DIAGNOSIS — R32 Unspecified urinary incontinence: Secondary | ICD-10-CM | POA: Diagnosis present

## 2020-09-01 DIAGNOSIS — G629 Polyneuropathy, unspecified: Secondary | ICD-10-CM | POA: Diagnosis present

## 2020-09-01 DIAGNOSIS — R112 Nausea with vomiting, unspecified: Secondary | ICD-10-CM | POA: Diagnosis not present

## 2020-09-01 DIAGNOSIS — S065X2S Traumatic subdural hemorrhage with loss of consciousness of 31 minutes to 59 minutes, sequela: Secondary | ICD-10-CM | POA: Diagnosis not present

## 2020-09-01 DIAGNOSIS — R531 Weakness: Secondary | ICD-10-CM | POA: Diagnosis present

## 2020-09-01 DIAGNOSIS — Z888 Allergy status to other drugs, medicaments and biological substances status: Secondary | ICD-10-CM

## 2020-09-01 LAB — COMPREHENSIVE METABOLIC PANEL
ALT: 23 U/L (ref 0–44)
AST: 15 U/L (ref 15–41)
Albumin: 2.5 g/dL — ABNORMAL LOW (ref 3.5–5.0)
Alkaline Phosphatase: 60 U/L (ref 38–126)
Anion gap: 9 (ref 5–15)
BUN: 10 mg/dL (ref 8–23)
CO2: 27 mmol/L (ref 22–32)
Calcium: 8.9 mg/dL (ref 8.9–10.3)
Chloride: 103 mmol/L (ref 98–111)
Creatinine, Ser: 0.73 mg/dL (ref 0.44–1.00)
GFR, Estimated: >60 mL/min (ref >60)
Glucose, Bld: 134 mg/dL — ABNORMAL HIGH (ref 70–99)
Potassium: 3.7 mmol/L (ref 3.5–5.1)
Sodium: 139 mmol/L (ref 135–145)
Total Bilirubin: 0.4 mg/dL (ref 0.3–1.2)
Total Protein: 5.8 g/dL — ABNORMAL LOW (ref 6.5–8.1)

## 2020-09-01 LAB — CBC WITH DIFFERENTIAL/PLATELET
Abs Immature Granulocytes: 0.09 10*3/uL — ABNORMAL HIGH (ref 0.00–0.07)
Basophils Absolute: 0 10*3/uL (ref 0.0–0.1)
Basophils Relative: 0 %
Eosinophils Absolute: 0.1 10*3/uL (ref 0.0–0.5)
Eosinophils Relative: 1 %
HCT: 29.5 % — ABNORMAL LOW (ref 36.0–46.0)
Hemoglobin: 9.5 g/dL — ABNORMAL LOW (ref 12.0–15.0)
Immature Granulocytes: 1 %
Lymphocytes Relative: 29 %
Lymphs Abs: 2.5 10*3/uL (ref 0.7–4.0)
MCH: 27.8 pg (ref 26.0–34.0)
MCHC: 32.2 g/dL (ref 30.0–36.0)
MCV: 86.3 fL (ref 80.0–100.0)
Monocytes Absolute: 1.1 10*3/uL — ABNORMAL HIGH (ref 0.1–1.0)
Monocytes Relative: 12 %
Neutro Abs: 5 10*3/uL (ref 1.7–7.7)
Neutrophils Relative %: 57 %
Platelets: 276 10*3/uL (ref 150–400)
RBC: 3.42 MIL/uL — ABNORMAL LOW (ref 3.87–5.11)
RDW: 16.1 % — ABNORMAL HIGH (ref 11.5–15.5)
WBC: 8.8 10*3/uL (ref 4.0–10.5)
nRBC: 0 % (ref 0.0–0.2)

## 2020-09-01 LAB — GLUCOSE, CAPILLARY
Glucose-Capillary: 148 mg/dL — ABNORMAL HIGH (ref 70–99)
Glucose-Capillary: 117 mg/dL — ABNORMAL HIGH (ref 70–99)

## 2020-09-01 LAB — MAGNESIUM: Magnesium: 2.3 mg/dL (ref 1.7–2.4)

## 2020-09-01 LAB — PHOSPHORUS: Phosphorus: 3.7 mg/dL (ref 2.5–4.6)

## 2020-09-01 MED ORDER — ACETAMINOPHEN 325 MG PO TABS
650.0000 mg | ORAL_TABLET | ORAL | Status: DC | PRN
  Administered 2020-09-06 – 2020-09-16 (×5): 650 mg via ORAL
  Filled 2020-09-01 (×7): qty 2

## 2020-09-01 MED ORDER — HYDROCODONE-ACETAMINOPHEN 5-325 MG PO TABS
1.0000 | ORAL_TABLET | ORAL | Status: DC | PRN
  Administered 2020-09-01 – 2020-09-02 (×3): 1
  Filled 2020-09-01 (×3): qty 1

## 2020-09-01 MED ORDER — BETHANECHOL CHLORIDE 10 MG PO TABS
10.0000 mg | ORAL_TABLET | Freq: Three times a day (TID) | ORAL | Status: DC
  Administered 2020-09-01 – 2020-09-02 (×4): 10 mg
  Filled 2020-09-01 (×4): qty 1

## 2020-09-01 MED ORDER — PANTOPRAZOLE SODIUM 40 MG PO PACK
40.0000 mg | PACK | Freq: Every day | ORAL | Status: DC
  Administered 2020-09-01 – 2020-09-02 (×2): 40 mg
  Filled 2020-09-01 (×2): qty 20

## 2020-09-01 MED ORDER — SENNA 8.6 MG PO TABS
1.0000 | ORAL_TABLET | Freq: Two times a day (BID) | ORAL | Status: DC
  Administered 2020-09-01 – 2020-09-02 (×3): 8.6 mg
  Filled 2020-09-01 (×3): qty 1

## 2020-09-01 MED ORDER — POLYETHYLENE GLYCOL 3350 17 G PO PACK
17.0000 g | PACK | Freq: Every day | ORAL | Status: DC | PRN
Start: 2020-09-01 — End: 2020-09-05
  Administered 2020-09-05: 10:00:00 17 g via ORAL
  Filled 2020-09-01: qty 1

## 2020-09-01 MED ORDER — METOPROLOL TARTRATE 25 MG/10 ML ORAL SUSPENSION
25.0000 mg | Freq: Two times a day (BID) | ORAL | Status: DC
  Administered 2020-09-01 – 2020-09-02 (×3): 25 mg
  Filled 2020-09-01 (×4): qty 10

## 2020-09-01 MED ORDER — BISACODYL 5 MG PO TBEC: 5.0000 mg | DELAYED_RELEASE_TABLET | Freq: Every day | ORAL | Status: DC | PRN

## 2020-09-01 MED ORDER — LEVETIRACETAM 100 MG/ML PO SOLN
1000.0000 mg | Freq: Two times a day (BID) | ORAL | Status: DC
  Administered 2020-09-01 – 2020-09-02 (×3): 1000 mg
  Filled 2020-09-01 (×4): qty 10

## 2020-09-01 MED ORDER — AMLODIPINE BESYLATE 10 MG PO TABS
10.0000 mg | ORAL_TABLET | Freq: Every day | ORAL | Status: DC
  Administered 2020-09-02: 09:00:00 10 mg
  Filled 2020-09-01: qty 1

## 2020-09-01 MED ORDER — OSMOLITE 1.2 CAL PO LIQD
720.0000 mL | ORAL | Status: DC
  Filled 2020-09-01: qty 948
  Filled 2020-09-01 (×2): qty 1000

## 2020-09-01 MED ORDER — AMITRIPTYLINE HCL 50 MG PO TABS
50.0000 mg | ORAL_TABLET | Freq: Every day | ORAL | Status: DC
  Administered 2020-09-01 – 2020-09-02 (×2): 50 mg
  Filled 2020-09-01 (×2): qty 1

## 2020-09-01 MED ORDER — ONDANSETRON HCL 4 MG PO TABS
4.0000 mg | ORAL_TABLET | ORAL | Status: DC | PRN
  Administered 2020-09-17: 4 mg via ORAL
  Filled 2020-09-01: qty 1

## 2020-09-01 MED ORDER — ONDANSETRON HCL 4 MG/2ML IJ SOLN: 4.0000 mg | INTRAMUSCULAR | Status: DC | PRN

## 2020-09-01 MED ORDER — ADULT MULTIVITAMIN W/MINERALS CH: 1.0000 | ORAL_TABLET | Freq: Every day | ORAL | Status: DC

## 2020-09-01 MED ORDER — ACETAMINOPHEN 650 MG RE SUPP: 650.0000 mg | RECTAL | Status: DC | PRN

## 2020-09-01 MED ORDER — ATORVASTATIN CALCIUM 80 MG PO TABS
80.0000 mg | ORAL_TABLET | Freq: Every day | ORAL | Status: DC
  Administered 2020-09-02: 09:00:00 80 mg
  Filled 2020-09-01: qty 1

## 2020-09-01 MED ORDER — ENSURE ENLIVE PO LIQD
237.0000 mL | Freq: Two times a day (BID) | ORAL | Status: DC
  Administered 2020-09-01: 14:00:00 237 mL via ORAL

## 2020-09-01 MED ORDER — PREGABALIN 50 MG PO CAPS
100.0000 mg | ORAL_CAPSULE | Freq: Two times a day (BID) | ORAL | Status: DC
  Administered 2020-09-01 – 2020-09-02 (×3): 100 mg
  Filled 2020-09-01 (×3): qty 2

## 2020-09-01 MED ORDER — TOPIRAMATE 25 MG PO TABS
50.0000 mg | ORAL_TABLET | Freq: Every day | ORAL | Status: DC
  Administered 2020-09-02 – 2020-09-04 (×3): 50 mg via ORAL
  Filled 2020-09-01 (×3): qty 2

## 2020-09-01 NOTE — H&P (Signed)
Physical Medicine and Rehabilitation Admission H&P    No chief complaint on file. : HPI: Amy Bruce is a 69 year old right-handed female with history of anxiety, cervical fusion 04/2020, obesity with BMI 32.68, prediabetes, fibromyalgia, hyperlipidemia, hypertension, PAD maintained on aspirin and Plavix as well as history of tobacco use.  Per chart review patient lives alone in [place].  Reportedly independent prior to admission.  She plans to stay with her daughter in [place] on discharge.  Presented 08/23/2020 after reported fall outside of her doctor's office when she lost her balance.  No reported loss of consciousness.  Cranial CT scan showed acute right cerebral convexity greater than falcine subdural hematoma.  12 mm leftward midline shift.  No definite ventricle trapping.    CT cervical spine negative for acute fracture or subluxation.  CT abdomen pelvis no evidence of acute traumatic injury.  Admission chemistries urinalysis negative nitrite urine screen negative, chemistries unremarkable aside calcium 8.6 WBC 10,800.  Follow-up neurosurgery for SDH underwent right frontal parietal craniotomy for evacuation of subdural hematoma 08/23/2020 per Dr. Conchita Paris.  EEG negative for seizure.  Maintained on Keppra for seizure prophylaxis.  Currently on a mechanical soft thin liquid diet as well as a nasogastric tube had been in place for nutritional support.  Bouts of urinary retention placed on low-dose Urecholine.  Due to patient's decrease in functional ability altered mental status she was admitted for an inpatient rehab comprehensive therapy course.  Pt has been having headaches daily since surgery- when coughs, can get up to 8/10- at rest ~ 4/10 WITH meds- asking if can try a preventative.  LBM this AM Trying to eat more so can get rid of [place]- wants it out when possible.  Not usually hungry- eats 1 meal/day at home.    Review of Systems  Constitutional: Negative for chills and  fever.  HENT: Negative for hearing loss.   Eyes: Negative for blurred vision and double vision.  Respiratory: Negative for cough and shortness of breath.   Cardiovascular: Positive for leg swelling. Negative for chest pain and palpitations.  Gastrointestinal: Positive for constipation. Negative for heartburn, nausea and vomiting.       GERD  Genitourinary: Negative for dysuria, flank pain and hematuria.  Musculoskeletal: Positive for joint pain and myalgias.  Skin: Negative for rash.  Psychiatric/Behavioral:       Anxiety  All other systems reviewed and are negative.  Past Medical History:  Diagnosis Date  . Anemia    in the past  . Anxiety    yrs. ago panic attacks  . Arthritis   . Cancer (HCC)    small in ovary - had total hysterestomy  . Complication of anesthesia    pt. reported her father became deaf after having anesthesia  . Fibromyalgia   . GERD (gastroesophageal reflux disease)   . Heart murmur    due to rheumatic fever, PCP no longer hears it  . Hyperlipidemia   . Hypertension   . Myalgia   . Neuropathy   . PAD (peripheral artery disease) (HCC)   . PAD (peripheral artery disease) (HCC)   . Pre-diabetes   . Pyelonephritis 09/10/2019    "septic"  . Tobacco abuse   . 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 ABDOMINAL AORTOGRAM W/LOWER EXTREMITY N/A 04/02/2018   Procedure: ABDOMINAL AORTOGRAM W/LOWER EXTREMITY;  Surgeon: Iran Ouch, MD;  [facility]: [facility];  Service: Cardiovascular;  Laterality: N/A;  . ABDOMINAL AORTOGRAM W/LOWER EXTREMITY N/A 09/23/2019   Procedure: ABDOMINAL AORTOGRAM W/LOWER EXTREMITY;  Surgeon: Wellington Hampshire, MD;  Location: [facility];  Service: Cardiovascular;  Laterality: N/A;  . ABDOMINAL HYSTERECTOMY  1999  . CERVICAL SPINE SURGERY  [id number]   C5/6 6/7  . COLONOSCOPY    . CRANIOTOMY Right 08/23/2020   Procedure: RIGHT CRANIOTOMY HEMATOMA  EVACUATION SUBDURAL;  Surgeon: Consuella Lose, MD;  Location: [facility];  Service: Neurosurgery;  Laterality: Right;  . ECTOPIC PREGNANCY SURGERY    . Left axillary cyst removal 1989    . MULTIPLE EXTRACTIONS WITH ALVEOLOPLASTY N/A 10/25/2017   Procedure: EXTRACTION NUMBERS FIVE, SEVEN, EIGHT, NINE, TEN, FIFTEEN, EIGHTEEN WITH ALVEOLOPLASTY, IRRIGATION AND DEBRIDEMENT LEFT SUBMANDIBULAR INFECTION;  Surgeon: Diona Browner, DDS;  Location: [facility];  Service: Oral Surgery;  Laterality: N/A;  . PERIPHERAL VASCULAR ATHERECTOMY Right 09/23/2019   Procedure: PERIPHERAL VASCULAR ATHERECTOMY;  Surgeon: Wellington Hampshire, MD;  Location: [facility];  Service: Cardiovascular;  Laterality: Right;  SFA  . PERIPHERAL VASCULAR BALLOON ANGIOPLASTY Right 09/23/2019   Procedure: PERIPHERAL VASCULAR BALLOON ANGIOPLASTY;  Surgeon: Wellington Hampshire, MD;  Location: [facility];  Service: Cardiovascular;  Laterality: Right;  SFA  . RADIOLOGY WITH ANESTHESIA N/A 11/18/2018   Procedure: MRI OF THE CERVICAL SPINE WITHOUT CONTRAST;  Surgeon: Radiologist, Medication, MD;  Location: [facility];  Service: Radiology;  Laterality: N/A;  . RADIOLOGY WITH ANESTHESIA N/A 12/03/2019   Procedure: MRI WITH ANESTHESIA CERVICAL WITH AND WITHOUT CONTRAST;  Surgeon: Radiologist, Medication, MD;  Location: [facility];  Service: Radiology;  Laterality: N/A;  . TONSILECTOMY, ADENOIDECTOMY, BILATERAL MYRINGOTOMY AND TUBES  1981   put not aware of tubes being put in ears  . TUBAL LIGATION  1981   Family History  Problem Relation Age of Onset  . Ovarian cancer Mother    Social History:  reports that she has been smoking cigarettes. She has a 33.00 pack-year smoking history. She has never used smokeless tobacco. She reports current alcohol use of about 2.0 standard drinks of alcohol per week. She reports that she does not use drugs. Allergies:  Allergies  Allergen Reactions  . Carisoprodol Swelling  . Duloxetine Hcl Swelling and  Hypertension   Medications Prior to Admission  Medication Sig Dispense Refill  . albuterol (VENTOLIN HFA) 108 (90 Base) MCG/ACT inhaler INHALE 1 TO 2 PUFFS INTO THE LUNGS EVERY 4 HOURS AS NEEDED FOR WHEEZING (Patient taking differently: Inhale 1-2 puffs into the lungs every 4 (four) hours as needed for wheezing.) 8.5 g 5  . amitriptyline (ELAVIL) 50 MG tablet TAKE 1 TABLET(50 MG) BY MOUTH AT BEDTIME (Patient taking differently: Take 50 mg by mouth at bedtime.) 30 tablet 1  . aspirin EC 81 MG tablet Take 81 mg by mouth daily.    Marland Kitchen atorvastatin (LIPITOR) 80 MG tablet Take 80 mg by mouth daily.    . calcium carbonate (TUMS - DOSED IN MG ELEMENTAL CALCIUM) 500 MG chewable tablet Chew 2 tablets by mouth 2 (two) times daily as needed for indigestion or heartburn.     . Camphor-Eucalyptus-Menthol (VICKS VAPORUB EX) Apply 1 application topically daily as needed (congestion).    . cilostazol (PLETAL) 100 MG tablet Take 100 mg by mouth 2 (two) times daily.    . clopidogrel (PLAVIX) 75 MG tablet Take 1 tablet (75 mg total) by mouth daily. 30 tablet 11  . cyclobenzaprine (FLEXERIL) 10 MG tablet Take 1 tablet (  10 mg total) by mouth 3 (three) times daily as needed for muscle spasms. 90 tablet 1  . diazepam (VALIUM) 5 MG tablet Take 5 mg by mouth daily as needed for anxiety, muscle spasms or sedation.    . diclofenac (VOLTAREN) 50 MG EC tablet Take 50 mg by mouth 3 (three) times daily.    . fexofenadine (ALLEGRA) 180 MG tablet Take 180 mg by mouth daily.    Marland Kitchen lidocaine (LIDODERM) 5 % UNWRAP AND APPLY 1 PATCH TO SKIN DAILY. REMOVE AND DISCARD PATCH WITHIN 12 HOURS OR AS DIRECTED BY MD. (Patient taking differently: Place 1 patch onto the skin daily as needed (pain.). UNWRAP AND APPLY 1 PATCH TO SKIN DAILY. REMOVE AND DISCARD PATCH WITHIN 12 HOURS OR AS DIRECTED BY MD.) 30 patch 1  . nicotine (NICODERM CQ - DOSED IN MG/24 HOURS) 21 mg/24hr patch Place 21 mg onto the skin daily as needed (smoking cessation).    .  nicotine polacrilex (COMMIT) 2 MG lozenge Take 2 mg by mouth as needed for smoking cessation (Max 20 lozenges per day).     . Omega-3 Fatty Acids (FISH OIL) 1000 MG CAPS Take 1 capsule by mouth daily.    Marland Kitchen oxyCODONE-acetaminophen (PERCOCET) 7.5-325 MG tablet Take 1 tablet by mouth every 4 (four) hours as needed for severe pain.    . pantoprazole (PROTONIX) 40 MG tablet Take 40 mg by mouth daily.    . pregabalin (LYRICA) 200 MG capsule TAKE 1 CAPSULE BY MOUTH EVERY MORNING, 1 CAPSULE AT NOON, AND 1 CAPSULE AT BEDTIME (Patient taking differently: Take 200 mg by mouth in the morning, at noon, and at bedtime.) 90 capsule 0  . tiZANidine (ZANAFLEX) 4 MG tablet TAKE 1/2 TABLET(2 MG) BY MOUTH THREE TIMES DAILY (Patient taking differently: Take 2 mg by mouth 3 (three) times daily.) 45 tablet 1  . vitamin B-12 (CYANOCOBALAMIN) 1000 MCG tablet Take 1,000 mcg by mouth daily.      Drug Regimen Review Drug regimen was reviewed and remains appropriate with no significant issues identified  Home:     Functional History:    Functional Status:  Mobility:          ADL:    Cognition: Cognition Orientation Level: Oriented X4    Physical Exam: Blood pressure (!) 142/63, pulse 87, temperature 98.3 F (36.8 C), temperature source Oral, resp. rate 17, height 5' (1.524 m), weight 75.3 kg, SpO2 99 %. Physical Exam Vitals and nursing note reviewed. Exam conducted with a chaperone present.  Constitutional:      Comments: Pt awake, alert, RN and daughter in room, transferring/walking back with 2 person A and RW to bed from toilet, Cotrak in place on R, NAD  HENT:     Head:     Comments: R craniotomy site with staples intact; speech slurred slightly- lips really dry- equal smile; some hair matted in back    Right Ear: External ear normal.     Left Ear: External ear normal.     Nose: Nose normal. No congestion.     Mouth/Throat:     Mouth: Mucous membranes are dry.     Pharynx: No oropharyngeal  exudate.  Eyes:     General:        Right eye: No discharge.        Left eye: No discharge.  Cardiovascular:     Rate and Rhythm: Normal rate and regular rhythm.     Heart sounds: Normal heart sounds. No murmur heard.  Pulmonary:     Comments: CTA B/L- no W/R/R- good air movement- occ dry cough Abdominal:     Comments: Soft, NT, ND, (+)BS - hypoactive  Musculoskeletal:     Cervical back: Normal range of motion. No rigidity.     Comments: RUE 5-/5 in biceps, triceps, WE< grip and finger abd LUE- 4+/5 in same muscles LEs- 5-5/ B/L in HF, KE, DF and PF  Skin:    Comments: Craniotomy site clean and dry with staples intact Lips really dry/cracked; no skin breakdown on buttocks or heels; 6 blisters only 1 still has fluid on L wrist/hand- other 5 are flat/popped/healing  Neurological:     Comments: Patient is alert sitting up in chair.  Mild left facial droop.  Makes eye contact with examiner.  She provides her name and age.  She knew the next holiday was Christmas.  Follows simple commands. Intact to light touch in all 4 extremities Slightly slurred speech/dysarthric, but mentation doing well- slightly impulsive.   Psychiatric:     Comments: Laughing a lot     Results for orders placed or performed during the hospital encounter of 08/23/20 (from the past 48 hour(s))  Glucose, capillary     Status: Abnormal   Collection Time: 08/30/20  8:06 PM  Result Value Ref Range   Glucose-Capillary 106 (H) 70 - 99 mg/dL    Comment: Glucose reference range applies only to samples taken after fasting for at least 8 hours.   Comment 1 Notify RN   Glucose, capillary     Status: Abnormal   Collection Time: 08/31/20 12:17 AM  Result Value Ref Range   Glucose-Capillary 152 (H) 70 - 99 mg/dL    Comment: Glucose reference range applies only to samples taken after fasting for at least 8 hours.  Comprehensive metabolic panel     Status: Abnormal   Collection Time: 08/31/20  1:35 AM  Result Value Ref  Range   Sodium 137 135 - 145 mmol/L   Potassium 3.6 3.5 - 5.1 mmol/L   Chloride 102 98 - 111 mmol/L   CO2 27 22 - 32 mmol/L   Glucose, Bld 134 (H) 70 - 99 mg/dL    Comment: Glucose reference range applies only to samples taken after fasting for at least 8 hours.   BUN 10 8 - 23 mg/dL   Creatinine, Ser 0.75 0.44 - 1.00 mg/dL   Calcium 8.8 (L) 8.9 - 10.3 mg/dL   Total Protein 5.6 (L) 6.5 - 8.1 g/dL   Albumin 2.4 (L) 3.5 - 5.0 g/dL   AST 17 15 - 41 U/L   ALT 23 0 - 44 U/L   Alkaline Phosphatase 61 38 - 126 U/L   Total Bilirubin 0.1 (L) 0.3 - 1.2 mg/dL   GFR, Estimated >60 >60 mL/min    Comment: (NOTE) Calculated using the CKD-EPI Creatinine Equation (2021)    Anion gap 8 5 - 15    Comment: Performed at [facility], [place] [address], [place], [place] [address]  Magnesium     Status: None   Collection Time: 08/31/20  1:35 AM  Result Value Ref Range   Magnesium 2.2 1.7 - 2.4 mg/dL    Comment: Performed at [facility] [address], [place], [place] [address]  Phosphorus     Status: None   Collection Time: 08/31/20  1:35 AM  Result Value Ref Range   Phosphorus 3.8 2.5 - 4.6 mg/dL    Comment: Performed at [facility], [place] [address] [place], [place] [address]  CBC with Differential/Platelet     Status: Abnormal   Collection Time: 08/31/20  1:35 AM  Result Value Ref Range   WBC 8.0 4.0 - 10.5 K/uL   RBC 3.30 (L) 3.87 - 5.11 MIL/uL   Hemoglobin 9.5 (L) 12.0 - 15.0 g/dL   HCT 28.1 (L) 36.0 - 46.0 %   MCV 85.2 80.0 - 100.0 fL   MCH 28.8 26.0 - 34.0 pg   MCHC 33.8 30.0 - 36.0 g/dL   RDW 16.4 (H) 11.5 - 15.5 %   Platelets 279 150 - 400 K/uL   nRBC 0.0 0.0 - 0.2 %   Neutrophils Relative % 50 %   Neutro Abs 4.0 1.7 - 7.7 K/uL   Lymphocytes Relative 33 %   Lymphs Abs 2.6 0.7 - 4.0 K/uL   Monocytes Relative 14 %   Monocytes Absolute 1.1 (H) 0.1 - 1.0 K/uL   Eosinophils Relative 1 %   Eosinophils Absolute 0.1 0.0 - 0.5 K/uL   Basophils Relative 1 %   Basophils  Absolute 0.0 0.0 - 0.1 K/uL   Immature Granulocytes 1 %   Abs Immature Granulocytes 0.09 (H) 0.00 - 0.07 K/uL    Comment: Performed at [facility], [address], [place], [place] [address]  Glucose, capillary     Status: Abnormal   Collection Time: 08/31/20  3:20 AM  Result Value Ref Range   Glucose-Capillary 124 (H) 70 - 99 mg/dL    Comment: Glucose reference range applies only to samples taken after fasting for at least 8 hours.  Glucose, capillary     Status: Abnormal   Collection Time: 08/31/20  8:15 AM  Result Value Ref Range   Glucose-Capillary 144 (H) 70 - 99 mg/dL    Comment: Glucose reference range applies only to samples taken after fasting for at least 8 hours.  Glucose, capillary     Status: Abnormal   Collection Time: 08/31/20 12:06 PM  Result Value Ref Range   Glucose-Capillary 137 (H) 70 - 99 mg/dL    Comment: Glucose reference range applies only to samples taken after fasting for at least 8 hours.  Glucose, capillary     Status: Abnormal   Collection Time: 08/31/20  4:10 PM  Result Value Ref Range   Glucose-Capillary 115 (H) 70 - 99 mg/dL    Comment: Glucose reference range applies only to samples taken after fasting for at least 8 hours.  Glucose, capillary     Status: Abnormal   Collection Time: 08/31/20  8:18 PM  Result Value Ref Range   Glucose-Capillary 120 (H) 70 - 99 mg/dL    Comment: Glucose reference range applies only to samples taken after fasting for at least 8 hours.  Glucose, capillary     Status: Abnormal   Collection Time: 08/31/20 11:35 PM  Result Value Ref Range   Glucose-Capillary 128 (H) 70 - 99 mg/dL    Comment: Glucose reference range applies only to samples taken after fasting for at least 8 hours.  Comprehensive metabolic panel     Status: Abnormal   Collection Time: 09/01/20  2:06 AM  Result Value Ref Range   Sodium 139 135 - 145 mmol/L   Potassium 3.7 3.5 - 5.1 mmol/L   Chloride 103 98 - 111 mmol/L   CO2 27 22 - 32 mmol/L    Glucose, Bld 134 (H) 70 - 99 mg/dL    Comment: Glucose reference range  applies only to samples taken after fasting for at least 8 hours.   BUN 10 8 - 23 mg/dL   Creatinine, Ser 0.73 0.44 - 1.00 mg/dL   Calcium 8.9 8.9 - 10.3 mg/dL   Total Protein 5.8 (L) 6.5 - 8.1 g/dL   Albumin 2.5 (L) 3.5 - 5.0 g/dL   AST 15 15 - 41 U/L   ALT 23 0 - 44 U/L   Alkaline Phosphatase 60 38 - 126 U/L   Total Bilirubin 0.4 0.3 - 1.2 mg/dL   GFR, Estimated >60 >60 mL/min    Comment: (NOTE) Calculated using the CKD-EPI Creatinine Equation (2021)    Anion gap 9 5 - 15    Comment: Performed at [facility], [place] [address] [place], [place] [address]  Magnesium     Status: None   Collection Time: 09/01/20  2:06 AM  Result Value Ref Range   Magnesium 2.3 1.7 - 2.4 mg/dL    Comment: Performed at [facility], [place] [address] [place], [place] [address]  Phosphorus     Status: None   Collection Time: 09/01/20  2:06 AM  Result Value Ref Range   Phosphorus 3.7 2.5 - 4.6 mg/dL    Comment: Performed at [facility] [address], [place], [place] [address]  CBC with Differential/Platelet     Status: Abnormal   Collection Time: 09/01/20  2:06 AM  Result Value Ref Range   WBC 8.8 4.0 - 10.5 K/uL   RBC 3.42 (L) 3.87 - 5.11 MIL/uL   Hemoglobin 9.5 (L) 12.0 - 15.0 g/dL   HCT 29.5 (L) 36.0 - 46.0 %   MCV 86.3 80.0 - 100.0 fL   MCH 27.8 26.0 - 34.0 pg   MCHC 32.2 30.0 - 36.0 g/dL   RDW 16.1 (H) 11.5 - 15.5 %   Platelets 276 150 - 400 K/uL   nRBC 0.0 0.0 - 0.2 %   Neutrophils Relative % 57 %   Neutro Abs 5.0 1.7 - 7.7 K/uL   Lymphocytes Relative 29 %   Lymphs Abs 2.5 0.7 - 4.0 K/uL   Monocytes Relative 12 %   Monocytes Absolute 1.1 (H) 0.1 - 1.0 K/uL   Eosinophils Relative 1 %   Eosinophils Absolute 0.1 0.0 - 0.5 K/uL   Basophils Relative 0 %   Basophils Absolute 0.0 0.0 - 0.1 K/uL   Immature Granulocytes 1 %   Abs Immature Granulocytes 0.09 (H) 0.00 - 0.07 K/uL    Comment: Performed at  [facility] [address] [place], [place] [address]  Glucose, capillary     Status: Abnormal   Collection Time: 09/01/20  4:02 AM  Result Value Ref Range   Glucose-Capillary 148 (H) 70 - 99 mg/dL    Comment: Glucose reference range applies only to samples taken after fasting for at least 8 hours.  Glucose, capillary     Status: Abnormal   Collection Time: 09/01/20  8:11 AM  Result Value Ref Range   Glucose-Capillary 117 (H) 70 - 99 mg/dL    Comment: Glucose reference range applies only to samples taken after fasting for at least 8 hours.   No results found.     Medical Problem List and Plan: 1.  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  Status post right frontal parietal craniotomy evacuation of subdural hematoma 08/23/2020  -patient may  Shower if can cover craniotomy site with cap  -ELOS/Goals:  10-14 days- mod I to supervision 2.  Antithrombotics: -DVT/anticoagulation: SCDs  -antiplatelet therapy: N/A 3. Pain Management/fibromyalgia: Lyrica 100 mg twice daily, Elavil 50 mg nightly, hydrocodone as needed 4. Mood: Provide emotional support  -antipsychotic agents: N/A 5. Neuropsych: This patient is capable of making decisions on her own behalf. 6. Skin/Wound Care: Routine skin checks- daily dressing changes on L hand/wrist 7. Fluids/Electrolytes/Nutrition: Routine in and outs with follow-up chemistries 8.  Seizure prophylaxis.  EEG negative.  Keppra 1000 mg twice daily 9.  Hypertension.  Norvasc 10 mg daily, Lopressor 25 mg twice daily.  Monitor with increased mobility 10.  Hyperlipidemia.  Lipitor 11.  History of PAD.  Patient on aspirin Plavix prior to admission.  Currently on hold due to SDH 12.  Tobacco abuse.  Counseling 13.  Morbid obesity.  BMI 32.68.  Dietary follow-up 14.  Urinary retention.  Urecholine 10 mg 3 times daily.  Check PVR 15.  Dysphagia.  Mechanical soft thin liquids.  Nasogastric tube had been in place for  nutritional support with calorie counts and hope to remove soon- getting calorie count currently.  16. Daily headaches- since crani- adding Topamax 50 mg QHS for prevention-  17. Insomnia- took ELvail at home for this- will continue- daughter wants "mother to not HAVE to take any medicine"- will con't home dose.   Lavon Paganini Angulli, PA-C 09/01/2020   I have personally performed a face to face diagnostic evaluation of this patient and formulated the key components of the plan.  Additionally, I have personally reviewed laboratory data, imaging studies, as well as relevant notes and concur with the physician assistant's documentation above.     Courtney Heys, MD 09/01/2020

## 2020-09-01 NOTE — Discharge Summary (Signed)
Physician Discharge Summary  Patient ID: Amy Bruce MRN: [id number] DOB/AGE: November 22, 1950 69 y.o.  Admit date: 08/23/2020 Discharge date: 09/01/2020  Admission Diagnoses: Right subdural hematoma, Plavix anticoagulation  Discharge Diagnoses: Right subdural hematoma, Plavix anticoagulation Active Problems:   Subdural hematoma [facility])   Discharged Condition: fair  Hospital Course: Patient was admitted emergently for surgical evacuation of a right subdural hematoma.  She underwent transfusion of platelets.  Consults: rehabilitation medicine  Significant Diagnostic Studies: None  Treatments: surgery: Right craniotomy for evacuation of subdural hematoma Amy Bruce on 08/23/2020  Discharge Exam: Blood pressure (!) 149/53, pulse 72, temperature 98.7 F (37.1 C), temperature source Oral, resp. rate 19, height 5' (1.524 m), weight 75.9 kg, SpO2 97 %. Incision is clean and dry.  Patient has some modest weakness and lethargy she is been evaluated by rehab and felt to be appropriate candidate for acute inpatient rehabilitation.  Disposition: Discharge disposition: [place] Not Defined       Discharge Instructions    Diet - low sodium heart healthy   Complete by: As directed    Increase activity slowly   Complete by: As directed    No wound care   Complete by: As directed      Allergies as of 09/01/2020      Reactions   Carisoprodol Swelling   Duloxetine Hcl Swelling, Hypertension      Medication List    TAKE these medications   albuterol 108 (90 Base) MCG/ACT inhaler Commonly known as: VENTOLIN HFA INHALE 1 TO 2 PUFFS INTO THE LUNGS EVERY 4 HOURS AS NEEDED FOR WHEEZING What changed: See the new instructions.   amitriptyline 50 MG tablet Commonly known as: ELAVIL TAKE 1 TABLET(50 MG) BY MOUTH AT BEDTIME What changed: See the new instructions.   aspirin EC 81 MG tablet Take 81 mg by mouth daily.   atorvastatin 80 MG tablet Commonly known as:  LIPITOR Take 80 mg by mouth daily.   calcium carbonate 500 MG chewable tablet Commonly known as: TUMS - dosed in mg elemental calcium Chew 2 tablets by mouth 2 (two) times daily as needed for indigestion or heartburn.   cilostazol 100 MG tablet Commonly known as: PLETAL Take 100 mg by mouth 2 (two) times daily.   clopidogrel 75 MG tablet Commonly known as: Plavix Take 1 tablet (75 mg total) by mouth daily.   cyclobenzaprine 10 MG tablet Commonly known as: FLEXERIL Take 1 tablet (10 mg total) by mouth 3 (three) times daily as needed for muscle spasms.   diazepam 5 MG tablet Commonly known as: VALIUM Take 5 mg by mouth daily as needed for anxiety, muscle spasms or sedation.   diclofenac 50 MG EC tablet Commonly known as: VOLTAREN Take 50 mg by mouth 3 (three) times daily.   fexofenadine 180 MG tablet Commonly known as: ALLEGRA Take 180 mg by mouth daily.   Fish Oil 1000 MG Caps Take 1 capsule by mouth daily.   lidocaine 5 % Commonly known as: LIDODERM UNWRAP AND APPLY 1 PATCH TO SKIN DAILY. REMOVE AND DISCARD PATCH WITHIN 12 HOURS OR AS DIRECTED BY MD. What changed:   how much to take  how to take this  when to take this  reasons to take this   nicotine 21 mg/24hr patch Commonly known as: NICODERM CQ - dosed in mg/24 hours Place 21 mg onto the skin daily as needed (smoking cessation).   nicotine polacrilex 2 MG lozenge Commonly known as: COMMIT Take 2 mg by  mouth as needed for smoking cessation (Max 20 lozenges per day).   oxyCODONE-acetaminophen 7.5-325 MG tablet Commonly known as: PERCOCET Take 1 tablet by mouth every 4 (four) hours as needed for severe pain.   pantoprazole 40 MG tablet Commonly known as: PROTONIX Take 40 mg by mouth daily.   pregabalin 200 MG capsule Commonly known as: LYRICA TAKE 1 CAPSULE BY MOUTH EVERY MORNING, 1 CAPSULE AT NOON, AND 1 CAPSULE AT BEDTIME What changed: See the new instructions.   tiZANidine 4 MG tablet Commonly  known as: ZANAFLEX TAKE 1/2 TABLET(2 MG) BY MOUTH THREE TIMES DAILY What changed: See the new instructions.   VICKS VAPORUB EX Apply 1 application topically daily as needed (congestion).   vitamin B-12 1000 MCG tablet Commonly known as: CYANOCOBALAMIN Take 1,000 mcg by mouth daily.        Signed: Blanchie Dessert Sophi Bruce 09/01/2020, 1:29 PM

## 2020-09-01 NOTE — Progress Notes (Signed)
Courtney Heys, MD  Physician  Physical Medicine and Rehabilitation  Consult Note     Signed  Date of Service:  08/30/2020 12:54 PM      Related encounter: ED to Hosp-Admission (Current) from 08/23/2020 in [facility]       Signed      Expand All Collapse All     Show:Clear all [x] Manual[x] Template[] Copied  Added by: [x] Angiulli, Lavon Paganini, PA-C[x] Courtney Heys, MD   [] Hover for details           Physical Medicine and Rehabilitation Consult Reason for Consult: Altered mental status Referring Physician: Dr. Kathyrn Sheriff     HPI: Amy Bruce is a 69 y.o. right-handed female with history of anxiety, cervical fusion 04/2020, obesity with BMI 32.68, prediabetes, fibromyalgia, hyperlipidemia, hypertension, PAD maintained on aspirin /Plavix, tobacco use.  Per chart review patient lives alone in [place].  Reportedly independent prior to admission.  She plans to stay with her daughter in [place] on discharge.  Presented 08/23/2020 after reported fall outside of her doctor's office when she lost her balance.  No reported loss of consciousness.  Cranial CT scan showed acute right cerebral convexity greater than been falcine subdural hematoma 12 mm leftward midline shift.  No definite ventricle trapping at that time.  CT cervical spine negative for acute fracture or subluxation.  CT abdomen pelvis no evidence of acute traumatic injury.  Admission chemistries urinalysis negative nitrite, urine drug screen negative, chemistries unremarkable except calcium 8.6, WBC 10,800.  Follow-up neurosurgery for SDH and underwent right frontal parietal craniotomy for evacuation of subdural hematoma 08/23/2020 per Dr. Kathyrn Sheriff.  EEG negative for seizure.  Maintained on Keppra for seizure prophylaxis.  Currently on mechanical soft diet thin liquids as well as initially receiving nasogastric tube feeds for nutritional support.  Due to patient's decreased functional mobility altered  mental status physical medicine rehab consult was requested.     Pt reports using foley catheter; LBM today- had a very large BM today-  Per her daughter, will have 24/7 at home with combo of 3 daughters.    Says has HA- asking for pain meds Not eating well, so has Cortrak for nutrition   Review of Systems  Constitutional: Negative for chills and fever.  HENT: Negative for hearing loss.   Eyes: Negative for blurred vision and double vision.  Respiratory: Negative for cough and shortness of breath.   Cardiovascular: Positive for leg swelling. Negative for chest pain and palpitations.  Gastrointestinal: Positive for constipation. Negative for heartburn, nausea and vomiting.       GERD  Genitourinary: Negative for dysuria, flank pain and hematuria.  Musculoskeletal: Positive for joint pain and myalgias.  Skin: Negative for rash.  Psychiatric/Behavioral:       Anxiety  All other systems reviewed and are negative.       Past Medical History:  Diagnosis Date  . Anemia      in the past  . Anxiety      yrs. ago panic attacks  . Arthritis    . 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 Complication of anesthesia      pt. reported her father became deaf after having anesthesia  . Fibromyalgia    . GERD (gastroesophageal reflux disease)    . Heart murmur      due to rheumatic fever, PCP no longer hears it  . Hyperlipidemia    . Hypertension    .  Myalgia    . Neuropathy    . PAD (peripheral artery disease) ([place])    . PAD (peripheral artery disease) ([place])    . Pre-diabetes    . Pyelonephritis 09/10/2019     "septic"  . Tobacco abuse    . 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 ABDOMINAL AORTOGRAM W/LOWER EXTREMITY N/A 04/02/2018    Procedure: ABDOMINAL AORTOGRAM W/LOWER EXTREMITY;  Surgeon: Wellington Hampshire, MD;  Location: [place] [facility];  Service: Cardiovascular;  Laterality: N/A;   . ABDOMINAL AORTOGRAM W/LOWER EXTREMITY N/A 09/23/2019    Procedure: ABDOMINAL AORTOGRAM W/LOWER EXTREMITY;  Surgeon: Wellington Hampshire, MD;  Location: [place] [facility];  Service: Cardiovascular;  Laterality: N/A;  . ABDOMINAL HYSTERECTOMY   1999  . CERVICAL SPINE SURGERY   [id number]    C5/6 6/7  . COLONOSCOPY      . CRANIOTOMY Right 08/23/2020    Procedure: RIGHT CRANIOTOMY HEMATOMA EVACUATION SUBDURAL;  Surgeon: Consuella Lose, MD;  Location: [facility];  Service: Neurosurgery;  Laterality: Right;  . ECTOPIC PREGNANCY SURGERY      . Left axillary cyst removal 1989      . MULTIPLE EXTRACTIONS WITH ALVEOLOPLASTY N/A 10/25/2017    Procedure: EXTRACTION NUMBERS FIVE, SEVEN, EIGHT, NINE, TEN, FIFTEEN, EIGHTEEN WITH ALVEOLOPLASTY, IRRIGATION AND DEBRIDEMENT LEFT SUBMANDIBULAR INFECTION;  Surgeon: Diona Browner, DDS;  Location: [facility];  Service: Oral Surgery;  Laterality: N/A;  . PERIPHERAL VASCULAR ATHERECTOMY Right 09/23/2019    Procedure: PERIPHERAL VASCULAR ATHERECTOMY;  Surgeon: Wellington Hampshire, MD;  Location: [place] [facility];  Service: Cardiovascular;  Laterality: Right;  SFA  . PERIPHERAL VASCULAR BALLOON ANGIOPLASTY Right 09/23/2019    Procedure: PERIPHERAL VASCULAR BALLOON ANGIOPLASTY;  Surgeon: Wellington Hampshire, MD;  Location: [place];  Service: Cardiovascular;  Laterality: Right;  SFA  . RADIOLOGY WITH ANESTHESIA N/A 11/18/2018    Procedure: MRI OF THE CERVICAL SPINE WITHOUT CONTRAST;  Surgeon: Radiologist, Medication, MD;  Location: [facility];  Service: Radiology;  Laterality: N/A;  . RADIOLOGY WITH ANESTHESIA N/A 12/03/2019    Procedure: MRI WITH ANESTHESIA CERVICAL WITH AND WITHOUT CONTRAST;  Surgeon: Radiologist, Medication, MD;  Location: [facility];  Service: Radiology;  Laterality: N/A;  . TONSILECTOMY, ADENOIDECTOMY, BILATERAL MYRINGOTOMY AND TUBES   1981    put not aware of tubes being put in ears  . TUBAL LIGATION   1981         Family History  Problem Relation Age of  Onset  . Ovarian cancer Mother      Social History:  reports that she has been smoking cigarettes. She has a 33.00 pack-year smoking history. She has never used smokeless tobacco. She reports current alcohol use of about 2.0 standard drinks of alcohol per week. She reports that she does not use drugs. Allergies:      Allergies  Allergen Reactions  . Carisoprodol Swelling  . Duloxetine Hcl Swelling and Hypertension          Medications Prior to Admission  Medication Sig Dispense Refill  . albuterol (VENTOLIN HFA) 108 (90 Base) MCG/ACT inhaler INHALE 1 TO 2 PUFFS INTO THE LUNGS EVERY 4 HOURS AS NEEDED FOR WHEEZING (Patient taking differently: Inhale 1-2 puffs into the lungs every 4 (four) hours as needed for wheezing.) 8.5 g 5  . amitriptyline (ELAVIL) 50 MG tablet TAKE 1 TABLET(50 MG) BY  MOUTH AT BEDTIME (Patient taking differently: Take 50 mg by mouth at bedtime.) 30 tablet 1  . aspirin EC 81 MG tablet Take 81 mg by mouth daily.      Marland Kitchen atorvastatin (LIPITOR) 80 MG tablet Take 80 mg by mouth daily.      . calcium carbonate (TUMS - DOSED IN MG ELEMENTAL CALCIUM) 500 MG chewable tablet Chew 2 tablets by mouth 2 (two) times daily as needed for indigestion or heartburn.       . Camphor-Eucalyptus-Menthol (VICKS VAPORUB EX) Apply 1 application topically daily as needed (congestion).      . clopidogrel (PLAVIX) 75 MG tablet Take 1 tablet (75 mg total) by mouth daily. 30 tablet 11  . cyclobenzaprine (FLEXERIL) 10 MG tablet Take 1 tablet (10 mg total) by mouth 3 (three) times daily as needed for muscle spasms. 90 tablet 1  . diazepam (VALIUM) 5 MG tablet Take 5 mg by mouth daily as needed for anxiety, muscle spasms or sedation.      . fexofenadine (ALLEGRA) 180 MG tablet Take 180 mg by mouth daily.      Marland Kitchen lidocaine (LIDODERM) 5 % UNWRAP AND APPLY 1 PATCH TO SKIN DAILY. REMOVE AND DISCARD PATCH WITHIN 12 HOURS OR AS DIRECTED BY MD. (Patient taking differently: Place 1 patch onto the skin daily as  needed (pain.). UNWRAP AND APPLY 1 PATCH TO SKIN DAILY. REMOVE AND DISCARD PATCH WITHIN 12 HOURS OR AS DIRECTED BY MD.) 30 patch 1  . nicotine (NICODERM CQ - DOSED IN MG/24 HOURS) 21 mg/24hr patch Place 21 mg onto the skin daily as needed (smoking cessation).      . Omega-3 Fatty Acids (FISH OIL) 1000 MG CAPS Take 1 capsule by mouth daily.      Marland Kitchen oxyCODONE-acetaminophen (PERCOCET) 7.5-325 MG tablet Take 1 tablet by mouth every 4 (four) hours as needed for severe pain.      . pantoprazole (PROTONIX) 40 MG tablet Take 40 mg by mouth daily.      . pregabalin (LYRICA) 200 MG capsule TAKE 1 CAPSULE BY MOUTH EVERY MORNING, 1 CAPSULE AT NOON, AND 1 CAPSULE AT BEDTIME (Patient taking differently: Take 200 mg by mouth in the morning, at noon, and at bedtime.) 90 capsule 0  . tiZANidine (ZANAFLEX) 4 MG tablet TAKE 1/2 TABLET(2 MG) BY MOUTH THREE TIMES DAILY (Patient taking differently: Take 2 mg by mouth 3 (three) times daily.) 45 tablet 1  . vitamin B-12 (CYANOCOBALAMIN) 1000 MCG tablet Take 1,000 mcg by mouth daily.      . cilostazol (PLETAL) 100 MG tablet Take 100 mg by mouth 2 (two) times daily.      . diclofenac (VOLTAREN) 50 MG EC tablet Take 50 mg by mouth 3 (three) times daily.      . nicotine polacrilex (COMMIT) 2 MG lozenge Take 2 mg by mouth as needed for smoking cessation (Max 20 lozenges per day).           Home: Home Living Family/patient expects to be discharged to:: Private residence Living Arrangements: Alone Type of Home: House Additional Comments: unsure of PLOF or PMH, pt not conversant on eval.  Functional History: Prior Function Comments: unsure of PLOF Functional Status:  Mobility: Bed Mobility Overal bed mobility: Needs Assistance Bed Mobility: Supine to Sit Supine to sit: Mod assist,HOB elevated,+2 for physical assistance Sit to supine: Max assist,+2 for physical assistance,HOB elevated General bed mobility comments: Mod A x 2, able to advance B LE to EOB with cues (light  assist for L LE). Increased assist for trunk Transfers Overall transfer level: Needs assistance Equipment used: Rolling walker (2 wheeled) Transfers: Sit to/from [employer] Sit to Stand: Mod assist,+2 physical assistance Stand pivot transfers: Mod assist,+2 physical assistance,+2 safety/equipment General transfer comment: Mod A x 2 for power up, cues for hand placement needed with each transition. Mod A x 2 for pivot > chair <> BSC with RW. Multimodal cues for sequencing and advancing RW Ambulation/Gait Ambulation/Gait assistance: Max assist,+2 physical assistance Gait Distance (Feet): 4 Feet Assistive device: Rolling walker (2 wheeled) Gait Pattern/deviations: Step-to pattern,Decreased stride length,Decreased step length - left,Narrow base of support General Gait Details: small lateral steps with significant assist to maintain balance, manage RW, and cues for each step Gait velocity: decreased Gait velocity interpretation: <1.31 ft/sec, indicative of household ambulator   ADL: ADL Overall ADL's : Needs assistance/impaired Lower Body Dressing: Maximal assistance,Sit to/from stand Lower Body Dressing Details (indicate cue type and reason): Max A for doffing soiled brief and donning new one. Toilet Transfer: Moderate assistance,+2 for physical assistance,+2 for safety/equipment,Stand-pivot,BSC,RW Toilet Transfer Details (indicate cue type and reason): Assistance to maintain balance with L LE weakness, cues for sequencing and manuevering RW Toileting- Clothing Manipulation and Hygiene: Maximal assistance,Sit to/from stand Toileting - Clothing Manipulation Details (indicate cue type and reason): Max A for posterior hygiene in standing and brief mgmt. Pt able to assist in standing by squatting and moving hips side to side General ADL Comments: Improved alertness and participation in ADLs, motivated to increase independence   Cognition: Cognition Overall Cognitive Status:  Impaired/Different from baseline Arousal/Alertness: Awake/alert Orientation Level: Oriented to person,Oriented to time,Oriented to situation,Disoriented to place Attention: Focused,Sustained Focused Attention: Appears intact Sustained Attention: Impaired Sustained Attention Impairment: Verbal basic,Functional basic Memory: Impaired Memory Impairment: Decreased recall of new information,Decreased short term memory Decreased Short Term Memory: Verbal basic,Functional basic Awareness: Impaired Awareness Impairment: Intellectual impairment,Emergent impairment Problem Solving: Impaired Problem Solving Impairment: Verbal basic,Functional basic [employer] Scales of Cognitive Functioning: Confused/inappropriate/non-agitated Cognition Arousal/Alertness: Awake/alert Behavior During Therapy: WFL for tasks assessed/performed,Impulsive Overall Cognitive Status: Impaired/Different from baseline General Comments: A&Ox4 today. Able to recall therapists' names and purpose of NG tube throughout session. Still some confusion at times (wondering where medications are, etc) but able to be redirected. Follows one step commands consistently with cues for safety, increasing awareness of deficits Difficult to assess due to: Level of arousal   Blood pressure (!) 124/59, pulse 74, temperature 98.2 F (36.8 C), temperature source Oral, resp. rate 18, height 5' (1.524 m), weight 75.9 kg, SpO2 97 %. Physical Exam Vitals and nursing note reviewed. Exam conducted with a chaperone present.  Constitutional:      Comments: Pt awake, alert, giddy/laughing a lot, but RN and daughter at bedside, appropriate/Ox3 otherwise, NAD  HENT:     Head:     Comments: Craniotomy site on R side of head- staples intact- looks ok- no erythema; cortrak in place Mild L facial droop- dentures in place    Right Ear: External ear normal.     Left Ear: External ear normal.     Nose: Nose normal. No congestion.     Mouth/Throat:      Mouth: Mucous membranes are dry.     Pharynx: Oropharynx is clear. No oropharyngeal exudate.     Comments: Tongue thickly coated with what appears to be thrush? Eyes:     General:        Right eye: No discharge.  Left eye: No discharge.     Extraocular Movements: Extraocular movements intact.     Comments: Muddy sclera B/L- no nystagmus  Cardiovascular:     Rate and Rhythm: Normal rate and regular rhythm.     Heart sounds: Normal heart sounds. No murmur heard.    Pulmonary:     Comments: CTA B/L- no W/R/R- good air movement  Abdominal:     Comments: Soft, NT, ND, (+)BS ;  hypoactive  Genitourinary:    Comments: Has foley- very minimal color to urine Musculoskeletal:     Cervical back: Normal range of motion. No rigidity.     Comments: LEs-  HF on L 4+/5 and KE 4+/5; otherwise 5-/5 in B/L LEs in HF/KE/DF/PF B/L  Skin:    General: Skin is warm and dry.     Comments: Craniotomy site clean and dry Has an IV in R AC fossa- looks OK L wrist wrapped- said had watch that caused skin issues  Neurological:     Comments: Patient sitting up in chair with daughter at bedside.  Makes eye contact with examiner.  Follows simple commands.  Provides her name and age.  She cannot recall full events of the fall. Cannot recall full hospitalization, however knows it's 08/30/2020 Giggling a lot/giddy/need to redirect focus Intact to light touch in all 4 extremities Knows Christmas in next holiday   Psychiatric:     Comments: Gleeful/bright        Lab Results Last 24 Hours       Results for orders placed or performed during the hospital encounter of 08/23/20 (from the past 24 hour(s))  Glucose, capillary     Status: Abnormal    Collection Time: 08/29/20  4:28 PM  Result Value Ref Range    Glucose-Capillary 140 (H) 70 - 99 mg/dL  Glucose, capillary     Status: Abnormal    Collection Time: 08/29/20  9:36 PM  Result Value Ref Range    Glucose-Capillary 150 (H) 70 - 99 mg/dL  Glucose,  capillary     Status: Abnormal    Collection Time: 08/29/20 11:56 PM  Result Value Ref Range    Glucose-Capillary 163 (H) 70 - 99 mg/dL  Glucose, capillary     Status: Abnormal    Collection Time: 08/30/20  3:45 AM  Result Value Ref Range    Glucose-Capillary 115 (H) 70 - 99 mg/dL  Basic metabolic panel     Status: Abnormal    Collection Time: 08/30/20  4:23 AM  Result Value Ref Range    Sodium 138 135 - 145 mmol/L    Potassium 3.5 3.5 - 5.1 mmol/L    Chloride 101 98 - 111 mmol/L    CO2 26 22 - 32 mmol/L    Glucose, Bld 122 (H) 70 - 99 mg/dL    BUN 8 8 - 23 mg/dL    Creatinine, Ser 0.68 0.44 - 1.00 mg/dL    Calcium 9.0 8.9 - 10.3 mg/dL    GFR, Estimated >60 >60 mL/min    Anion gap 11 5 - 15  Phosphorus     Status: None    Collection Time: 08/30/20  4:23 AM  Result Value Ref Range    Phosphorus 3.6 2.5 - 4.6 mg/dL  Glucose, capillary     Status: Abnormal    Collection Time: 08/30/20  8:02 AM  Result Value Ref Range    Glucose-Capillary 162 (H) 70 - 99 mg/dL  Glucose, capillary     Status: Abnormal  Collection Time: 08/30/20 12:42 PM  Result Value Ref Range    Glucose-Capillary 141 (H) 70 - 99 mg/dL      Imaging Results (Last 48 hours)  No results found.       Assessment/Plan: Diagnosis: Fall causing R SDH s/p craniotomy 65mm R>L midline shift- with mild L sided weakness, urinary retention, and poor appetite 1. Does the need for close, 24 hr/day medical supervision in concert with the patient's rehab needs make it unreasonable for this patient to be served in a less intensive setting? Yes 2. Co-Morbidities requiring supervision/potential complications: HTN, prediabetes, anxiety, FMS, PAD, s/p R crani; poor intake/Cortrak 3. Due to bladder management, bowel management, safety, skin/wound care, disease management, medication administration, pain management and patient education, does the patient require 24 hr/day rehab nursing? Yes 4. Does the patient require coordinated  care of a physician, rehab nurse, therapy disciplines of PT, OT and SLP to address physical and functional deficits in the context of the above medical diagnosis(es)? Yes Addressing deficits in the following areas: balance, endurance, locomotion, strength, transferring, bowel/bladder control, bathing, dressing, feeding, grooming, toileting, cognition and swallowing 5. Can the patient actively participate in an intensive therapy program of at least 3 hrs of therapy per day at least 5 days per week? Yes 6. The potential for patient to make measurable gains while on inpatient rehab is good 7. Anticipated functional outcomes upon discharge from inpatient rehab are supervision  with PT, supervision with OT, supervision and min assist with SLP. 8. Estimated rehab length of stay to reach the above functional goals is: 10-14 days 9. Anticipated discharge destination: Home 10. Overall Rehab/Functional Prognosis: good   RECOMMENDATIONS: This patient's condition is appropriate for continued rehabilitative care in the following setting: [facility] Patient has agreed to participate in recommended program. Potentially Note that insurance prior authorization may be required for reimbursement for recommended care.   Comment: 1. Appears pt might have beginnings of thrush in mouth- suggest Nystatin Swish and swallow- maybe more.  2. Suggest checking KUB- first BM today "for awhile"- might be constipation as cause of poor appetite. If so, can address 3. She gave impression that headaches have been daily since Craniotomy- suggest Topamax 50 mg QHS for HA prevention? Less sedation than Elavil at this point.  4. Will submit for insurance/admissions coordinators to determine if can get into CIR.  5. Thank you for this consult.        Lavon Paganini Angiulli, PA-C 08/30/2020      I have personally performed a face to face diagnostic evaluation of this patient and formulated the key components of the plan.  Additionally, I  have personally reviewed laboratory data, imaging studies, as well as relevant notes and concur with the physician assistant's documentation above.                Revision History                        Routing History              Note Details  Jan Fireman, MD File Time 08/30/2020  7:22 PM  Author Type Physician Status Signed  Last Editor Courtney Heys, MD Service Physical Medicine and Rehabilitation

## 2020-09-01 NOTE — Progress Notes (Signed)
Inpatient Rehabilitation Medication Review by a Pharmacist  A complete drug regimen review was completed for this patient to identify any potential clinically significant medication issues.  Clinically significant medication issues were identified:  Yes  Type of Medication Issue Identified Description of Issue Urgent (address now) Non-Urgent (address on AM team rounds) Plan Plan Accepted by Provider? (Yes / No / Pending AM Rounds)  Drug Interaction(s) (clinically significant)       Duplicate Therapy       Allergy       No Medication Administration End Date       Incorrect Dose       Additional Drug Therapy Needed       Other  The following medications were listed on pt's discharge med list (were listed on pt's med list PTA to [facility], but pt did not receive these while at [facility]): Albuterol inhaler, ASA (on hold), calcium carbonate, cilostazol, clopidogrel (on hold), cyclobenzaprine, diazepam, diclofenac, fexofenadrine, fish oil, lidocaine patch, nicotine patch, nicotine gum, Percocet, tizanidine, vitamin B-12  Scientist, physiological suggested adding hydralazine PRN for elevated BP Non urgent For review by CIR team on AM rounds Pending AM rounds    Name of provider notified for urgent issues identified:  N/A  For non-urgent medication issues to be resolved on team rounds tomorrow morning a [facility] Secure Chat Handoff was sent to:  N/A (CIR med rec completed after 5 PM)  Time spent performing this drug regimen review (minutes):  20  Gillermina Hu, PharmD, BCPS, [facility] Clinical Pharmacist 09/01/2020 4:53 PM

## 2020-09-01 NOTE — PMR Pre-admission (Signed)
PMR Admission Coordinator Pre-Admission Assessment  Patient: Amy Bruce is an 69 y.o., female MRN: [id number] DOB: 10-30-1950 Height: 5' (152.4 cm) Weight: 75.9 kg              Insurance Information HMO: yes    PPO:      PCP:      IPA:      80/20:      OTHER:  PRIMARY: [place]      Policy#: [id number]      Subscriber: pt CM Name: Edwena Blow      Phone#: [phone] option 8 for [place]     Fax#: [phone] Pre-Cert#: [id number] approved for 7 days      Employer:  Benefits:  Phone #: online     Name: [id number] Eff. Date: 05/11/2020     Deduct: none      Out of Pocket Max: $7550      Life Max: none  CIR: $1400 co pay per admit      SNF: no copay days 1 until 20; $185.50 per day days 21 until 100 Outpatient: 80%     Co-Pay: visits per medical neccesity Home Health: 100%      Co-Pay: visits per medical neccesity DME: 80%     Co-Pay: 20% Providers: in network  SECONDARY: Medicaid [place] Access      Policy#: [id number] [facility]  Financial Counselor:       Phone#:   The "Data Collection Information Summary" for patients in Inpatient Rehabilitation Facilities with attached "Privacy Act [place] Records" was provided and verbally reviewed with: Patient and Family  Emergency Contact Information Contact Information    Name Relation Home Work [place], [place] Daughter   [phone]   O'Flaherty,Veronica Friend [phone]     Sybile, Frazier Daughter   [phone]     Current Medical History  Patient Admitting Diagnosis: SDH  History of Present Illness:  69 year old right-handed female with history of anxiety, cervical fusion 04/2020, obesity with BMI 32.68, prediabetes, fibromyalgia, hyperlipidemia, hypertension, PAD maintained on aspirin and Plavix as well as history of tobacco use.  Presented 08/23/2020 after reported fall outside of her doctor's office when she lost her balance.  No reported loss of consciousness.  Cranial CT scan showed acute right  cerebral convexity greater than falcine subdural hematoma.  12 mm leftward midline shift.  No definite ventricle trapping.    CT cervical spine negative for acute fracture or subluxation.  CT abdomen pelvis no evidence of acute traumatic injury.  Admission chemistries urinalysis negative nitrite urine screen negative, chemistries unremarkable aside calcium 8.6 WBC 10,800.  Follow-up neurosurgery for SDH underwent right frontal parietal craniotomy for evacuation of subdural hematoma 08/23/2020 per Dr. Kathyrn Sheriff.  EEG negative for seizure.  Maintained on Keppra for seizure prophylaxis.  Currently on a mechanical soft thin liquid diet as well as a nasogastric tube had been in place for nutritional support.  Bouts of urinary retention placed on low-dose Urecholine   Past Medical History  Past Medical History:  Diagnosis Date  . Anemia    in the past  . Anxiety    yrs. ago panic attacks  . Arthritis   . Cancer ([place])    small in ovary - had total hysterestomy  . Complication of anesthesia    pt. reported her father became deaf after having anesthesia  . Fibromyalgia   . GERD (gastroesophageal reflux disease)   . Heart murmur    due to rheumatic fever, PCP no longer  hears it  . Hyperlipidemia   . Hypertension   . Myalgia   . Neuropathy   . PAD (peripheral artery disease) ([place])   . PAD (peripheral artery disease) ([place])   . Pre-diabetes   . Pyelonephritis 09/10/2019    "septic"  . Tobacco abuse   . 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  Prior Rehab/Hospitalizations:  Has the patient had prior rehab or hospitalizations prior to admission? Yes  Has the patient had major surgery during 100 days prior to admission? Yes  Current Medications   Current Facility-Administered Medications:  .  0.9 %  sodium chloride infusion, , Intravenous, Continuous, Costella, Vista Mink, PA-C, Last Rate: 75 mL/hr at 08/31/20  2011, New Bag at 08/31/20 2011 .  0.9 %  sodium chloride infusion, , Intravenous, PRN, Consuella Lose, MD, Stopped at 08/24/20 1319 .  acetaminophen (TYLENOL) tablet 650 mg, 650 mg, Oral, Q4H PRN, 650 mg at 08/31/20 1441 **OR** acetaminophen (TYLENOL) suppository 650 mg, 650 mg, Rectal, Q4H PRN, Costella, Vincent J, PA-C .  amitriptyline (ELAVIL) tablet 50 mg, 50 mg, Per Tube, QHS, Consuella Lose, MD, 50 mg at 08/31/20 2202 .  amLODipine (NORVASC) tablet 10 mg, 10 mg, Per Tube, Daily, Olalere, Adewale A, MD, 10 mg at 09/01/20 1008 .  atorvastatin (LIPITOR) tablet 80 mg, 80 mg, Per Tube, Daily, Consuella Lose, MD, 80 mg at 09/01/20 1012 .  bethanechol (URECHOLINE) tablet 10 mg, 10 mg, Per Tube, TID, Olalere, Adewale A, MD, 10 mg at 09/01/20 1011 .  bisacodyl (DULCOLAX) EC tablet 5 mg, 5 mg, Oral, Daily PRN, Costella, Vincent J, PA-C .  chlorhexidine (PERIDEX) 0.12 % solution 15 mL, 15 mL, Mouth Rinse, BID, Bergman, Meghan D, NP, 15 mL at 09/01/20 1013 .  Chlorhexidine Gluconate Cloth 2 % PADS 6 each, 6 each, Topical, Daily, Consuella Lose, MD, 6 each at 08/31/20 [phone] .  feeding supplement (OSMOLITE 1.2 CAL) liquid 720 mL, 720 mL, Per Tube, Continuous, Consuella Lose, MD, Last Rate: 60 mL/hr at 08/31/20 2011, 720 mL at 08/31/20 2011 .  hydrALAZINE (APRESOLINE) injection 20 mg, 20 mg, Intravenous, Q4H PRN, Olalere, Adewale A, MD, 20 mg at 08/28/20 1805 .  HYDROcodone-acetaminophen (NORCO/VICODIN) 5-325 MG per tablet 1 tablet, 1 tablet, Per Tube, Q4H PRN, Consuella Lose, MD, 1 tablet at 08/31/20 2203 .  HYDROmorphone (DILAUDID) injection 0.5-1 mg, 0.5-1 mg, Intravenous, Q2H PRN, Costella, Vincent J, PA-C, 1 mg at 08/28/20 0445 .  levETIRAcetam (KEPPRA) 100 MG/ML solution 1,000 mg, 1,000 mg, Per Tube, BID, Consuella Lose, MD, 1,000 mg at 08/31/20 2202 .  MEDLINE mouth rinse, 15 mL, Mouth Rinse, q12n4p, Bergman, Meghan D, NP, 15 mL at 08/31/20 1441 .  metoprolol tartrate  (LOPRESSOR) 25 mg/10 mL oral suspension 25 mg, 25 mg, Per Tube, BID, Consuella Lose, MD, 25 mg at 09/01/20 1013 .  naloxone (NARCAN) injection 0.08 mg, 0.08 mg, Intravenous, PRN, Costella, Vincent J, PA-C .  ondansetron (ZOFRAN) tablet 4 mg, 4 mg, Oral, Q4H PRN **OR** ondansetron (ZOFRAN) injection 4 mg, 4 mg, Intravenous, Q4H PRN, Costella, Vincent J, PA-C .  pantoprazole sodium (PROTONIX) 40 mg/20 mL oral suspension 40 mg, 40 mg, Per Tube, QHS, Consuella Lose, MD, 40 mg at 08/31/20 2202 .  polyethylene glycol (MIRALAX / GLYCOLAX) packet 17 g, 17 g, Oral, Daily PRN, Costella, Vincent J, PA-C, 17 g at 08/29/20 0429 .  pregabalin (LYRICA) capsule 100 mg, 100  mg, Per Tube, BID, Bowser, Grace E, NP, 100 mg at 09/01/20 1013 .  promethazine (PHENERGAN) tablet 12.5-25 mg, 12.5-25 mg, Oral, Q4H PRN, Costella, Vista Mink, PA-C .  senna (SENOKOT) tablet 8.6 mg, 1 tablet, Per Tube, BID, Consuella Lose, MD, 8.6 mg at 08/31/20 2202 .  sodium phosphate (FLEET) 7-19 GM/118ML enema 1 enema, 1 enema, Rectal, Once PRN, Costella, Vista Mink, PA-C  Patients Current Diet:  Diet Order            DIET DYS 3 Room service appropriate? Yes; Fluid consistency: Thin  Diet effective now                 Precautions / Restrictions Precautions Precautions: Fall Precaution Comments: cortrak, R crani Restrictions Weight Bearing Restrictions: No   Has the patient had 2 or more falls or a fall with injury in the past year?Yes  Prior Activity Level Community (5-7x/wk): very independent and driving  Prior Functional Level Prior Function Level of Independence: Independent Comments: Independent without AD  Self Care: Did the patient need help bathing, dressing, using the toilet or eating?  Independent  Indoor Mobility: Did the patient need assistance with walking from room to room (with or without device)? Independent  Stairs: Did the patient need assistance with internal or external stairs (with or  without device)? Independent  Functional Cognition: Did the patient need help planning regular tasks such as shopping or remembering to take medications? Independent  Home Assistive Devices / Equipment Home Assistive Devices/Equipment: None  Prior Device Use: Indicate devices/aids used by the patient prior to current illness, exacerbation or injury? None of the above  Current Functional Level Cognition  Arousal/Alertness: Awake/alert Overall Cognitive Status: Impaired/Different from baseline Difficult to assess due to: Level of arousal Orientation Level: Oriented X4 General Comments: A&Ox4, very cheery throughout session. Pt is impulsive and requires cues to slow down especially with RW use. At one point, pt let go of her RW and continued to walk while trying to fix her mask, + LOB requiring PT correction. Attention: Focused,Sustained Focused Attention: Appears intact Sustained Attention: Impaired Sustained Attention Impairment: Verbal basic,Functional basic Memory: Impaired Memory Impairment: Decreased recall of new information,Decreased short term memory Decreased Short Term Memory: Verbal basic,Functional basic Awareness: Impaired Awareness Impairment: Intellectual impairment,Emergent impairment Problem Solving: Impaired Problem Solving Impairment: Verbal basic,Functional basic [employer] Scales of Cognitive Functioning: Confused/inappropriate/non-agitated    Extremity Assessment (includes Sensation/Coordination)  Upper Extremity Assessment: LUE deficits/detail RUE Deficits / Details: pt appears to have limited shoulder ROM, difficult to fully assess given lethargy RUE Coordination: decreased gross motor LUE Deficits / Details: Increasing movement of L UE, 3/5 strength and some decreased coordination/proprioception when UE not in visual field LUE Sensation: decreased proprioception,decreased light touch LUE Coordination: decreased fine motor,decreased gross motor  Lower  Extremity Assessment: Defer to PT evaluation RLE Deficits / Details: full AROM appreciated LLE Deficits / Details: demonstrating hypertonicity in hip and knee flexion, hip abd    ADLs  Overall ADL's : Needs assistance/impaired Lower Body Dressing: Maximal assistance,Sit to/from stand Lower Body Dressing Details (indicate cue type and reason): Max A for doffing soiled brief and donning new one. Toilet Transfer: Moderate assistance,+2 for physical assistance,+2 for safety/equipment,Stand-pivot,BSC,RW Toilet Transfer Details (indicate cue type and reason): Assistance to maintain balance with L LE weakness, cues for sequencing and manuevering RW Toileting- Clothing Manipulation and Hygiene: Maximal assistance,Sit to/from stand Toileting - Clothing Manipulation Details (indicate cue type and reason): Max A for posterior hygiene in standing and  brief mgmt. Pt able to assist in standing by squatting and moving hips side to side General ADL Comments: Improved alertness and participation in ADLs, motivated to increase independence    Mobility  Overal bed mobility: Needs Assistance Bed Mobility: Supine to Sit Supine to sit: Min assist Sit to supine: Max assist,+2 for physical assistance,HOB elevated General bed mobility comments: min assist for trunk elevation, scooting to EOB. pt moves LEs to EOB without PT assist    Transfers  Overall transfer level: Needs assistance Equipment used: Rolling walker (2 wheeled) Transfers: Sit to/from Stand Sit to Stand: Min assist,+2 safety/equipment Stand pivot transfers: Mod assist,+2 physical assistance,+2 safety/equipment General transfer comment: min assist x2 from EOB And from chair in hallway, assist for power up and steadying, as well as hand placement.    Ambulation / Gait / Stairs / Wheelchair Mobility  Ambulation/Gait Ambulation/Gait assistance: Min assist,+2 safety/equipment Gait Distance (Feet): 100 Feet (x2) Assistive device: Rolling walker (2  wheeled) Gait Pattern/deviations: Step-through pattern,Decreased stride length,Trunk flexed,Drifts right/left,Staggering right General Gait Details: min-mod assist to steady, physically manage RW from drifting R, slow pt forward progression, and steady during near LOB x2. Seated rest break x1 during gait, unsafe and fast descent into seated. Gait velocity: decr Gait velocity interpretation: <1.31 ft/sec, indicative of household ambulator    Posture / Balance Dynamic Sitting Balance Sitting balance - Comments: able to statically sit, min guard for safety Balance Overall balance assessment: Needs assistance Sitting-balance support: Single extremity supported,Feet supported,Bilateral upper extremity supported Sitting balance-Leahy Scale: Fair Sitting balance - Comments: able to statically sit, min guard for safety Postural control: Right lateral lean Standing balance support: Bilateral upper extremity supported,During functional activity Standing balance-Leahy Scale: Poor Standing balance comment: reliant on B UE support    Special needs/care consideration CORTRAK     Previous Home Environment  Living Arrangements: Alone  Lives With: Alone Available Help at Discharge: Family,Available 24 hours/day (plans to go to daughters home in [place] or [place] depending on functional level at discharge) Type of Home: Apartment Home Layout: One level Home Access: Level entry,Elevator Bathroom Shower/Tub: Tub/shower unit,Curtain Biochemist, clinical: Standard Bathroom Accessibility: Yes How Accessible: Accessible via walker [place]: No Additional Comments: verified with family  Discharge Living Setting Plans for Discharge Living Setting: House (plans for either [place] or [place] with one of her daughters) Does the patient have any problems obtaining your medications?: No   If 24/7 supervision needed at d/c, she will stay with Elray Mcgregor, daughter in [place], [place]. If intermittent  supervision , she will stay with daughter, Rogers Seeds, in [place] Patient Roles: Parent Sport and exercise psychologist Information: Rogers Seeds daughter in [place] and [place] , daughter in [place] Anticipated Caregiver: daughters Anticipated Ambulance person Information: see above Ability/Limitations of Caregiver: Rogers Seeds in [place] works outside the home, [place] in [place] works from home Caregiver Availability: 24/7 Discharge Plan Discussed with Primary Caregiver: Yes Is Caregiver In Agreement with Plan?: Yes Does Caregiver/Family have Issues with Lodging/Transportation while Pt is in Rehab?: No  Daughters are coming to visit patient while on rehab as well as her best friend, [place]. They would like ultimately for her to move to [facility].  I have dicussed with both daughters the need for PCP follow up set up prior to her d/c as well as to check into medicare advantage plans and Medicaid in the area that she will be discharged to . They request assistance in getting connected with PCP and having her medical information provided to them prior to  her d/c from CIR.  Goals Patient/Family Goal for Rehab: Mod I ot supervision wiht PT, OT, and SLP Expected length of stay: ELOS 10 to 14 days Additional Information: Patient will need PCP set up in either [place] or [place] Pt/Family Agrees to Admission and willing to participate: Yes Program Orientation Provided & Reviewed with Pt/Caregiver Including Roles  & Responsibilities: Yes  Decrease burden of Care through IP rehab admission: n/a  Possible need for SNF placement upon discharge:not anticipated  Patient Condition: This patient's medical and functional status have not changed since the consult dated: 08/30/2020 in which the Rehabilitation Physician determined and documented that the patient's condition is appropriate for intensive rehabilitative care in an inpatient rehabilitation facility.  Functional changes are: overall min assist.  After evaluating the patient today and speaking with the Rehabilitation physician and acute team, the patient remains appropriate for inpatient rehab. Will admit to inpatient rehab today.  Preadmission Screen Completed By:  Cleatrice Burke, RN, 09/01/2020 11:18 AM ______________________________________________________________________   Discussed status with Dr. Dagoberto Ligas on 09/01/2020 at  1129 and received approval for admission today.  Admission Coordinator:  Cleatrice Burke, time [id number] Date 09/01/2020

## 2020-09-01 NOTE — Progress Notes (Signed)
Inpatient Rehabilitation Admissions Coordinator  I have insurance approval and CIR bed to admit this patient to today. I met with patient and her Daughter, Elray Mcgregor, and they are in agreement. I notified Dr. Ellene Route, Acute team and TOC. I will make the arrangements to admit today.  Danne Baxter, RN, MSN Rehab Admissions Coordinator [phone] 09/01/2020 11:04 AM

## 2020-09-01 NOTE — Progress Notes (Signed)
Calorie Count Note  48 hour calorie count ordered.  Diet: dysphagia 3 diet with thin liquids  Pt's intake has improved. Per chart review, plan to d/c to CIR today.   Sent secure message to MD to discuss possible d/c of cortrak and TF. RD to add Ensure Enlive supplements to assist in meeting nutritional needs.   12/22 Breakfast: 436 kcals, 10 grams protein Lunch: 428 kcals, 18 grams protein Dinner: 209 kcals, 22 grams protein  Total intake: 1073 kcal (63% of minimum estimated needs)  50 grams protein (59% of minimum estimated needs)  12/23 Breakfast: 330 kcals, 24 grams protein  Nutrition Dx: Inadequate oral intake related to inability to eat as evidenced by NPO status; progressing; advanced to dysphagia 3 diet on 08/30/20  Goal:  Patient will meet greater than or equal to 90% of their needs; progressing   Intervention:   -D/c calorie count -Ensure Enlive po BID, each supplement provides 350 kcal and 20 grams of protein -MVI with minerals daily -Recommend d/c cortrak and nocturnal TF; sent secure chat message to MD to discuss  Loistine Chance, RD, LDN, [place] Registered Dietitian II Certified Diabetes Care and Education Specialist Please refer to [facility] for RD and/or RD on-call/weekend/after hours pager

## 2020-09-01 NOTE — Progress Notes (Signed)
Patient admitted to 4M05 via bed at 1640.  Alert, oriented, responding to questions.  Oriented to rehab unit.

## 2020-09-01 NOTE — Progress Notes (Signed)
MD at bedside. Verbal Order to remove Cortrak. Cortrak removed with no complications. Patients daughter informed of patient moving to new room 4M5. Patient transported to CIR with all belongings.

## 2020-09-01 NOTE — Progress Notes (Signed)
Amy Gong, RN  Rehab Admission Coordinator  Physical Medicine and Rehabilitation  PMR Pre-admission     Signed  Date of Service:  09/01/2020 11:18 AM      Related encounter: ED to Hosp-Admission (Current) from 08/23/2020 in [place]          Show:Clear all [x] Manual[x] Template[x] Copied  Added by: [x] Amy Gong, RN   [] Hover for details  PMR Admission Coordinator Pre-Admission Assessment   Patient: Amy Bruce is an 69 y.o., female MRN: [id number] DOB: 10-29-1950 Height: 5' (152.4 cm) Weight: 75.9 kg                                                                                                                                                  Insurance Information HMO: yes    PPO:      PCP:      IPA:      80/20:      OTHER:  PRIMARY: [place]      Policy#: [id number]      Subscriber: pt CM Name: Edwena Blow      Phone#: [phone] option 8 for [place]     Fax#: [phone] Pre-Cert#: [id number] approved for 7 days      Employer:  Benefits:  Phone #: online     Name: [id number] [place]. Date: 05/11/2020     Deduct: none      Out of Pocket Max: $7550      Life Max: none  CIR: $1400 co pay per admit      SNF: no copay days 1 until 20; $185.50 per day days 21 until 100 Outpatient: 80%     Co-Pay: visits per medical neccesity Home Health: 100%      Co-Pay: visits per medical neccesity DME: 80%     Co-Pay: 20% Providers: in network  SECONDARY: Medicaid [place] Access      Policy#: [id number] [facility]   Financial Counselor:       Phone#:    The "Data Collection Information Summary" for patients in Inpatient Rehabilitation Facilities with attached "Privacy Act [place] Records" was provided and verbally reviewed with: Patient and Family   Emergency Contact Information         Contact Information     Name Relation Home Work [place], [place] Daughter     [phone]    O'Flaherty,Veronica  Friend [phone]        Ellys, [place] Daughter     [phone]       Current Medical History  Patient Admitting Diagnosis: SDH   History of Present Illness:  69 year old right-handed female with history of anxiety, cervical fusion 04/2020, obesity with BMI 32.68, prediabetes, fibromyalgia, hyperlipidemia, hypertension, PAD maintained on aspirin and Plavix as well as history of tobacco use.  Presented 08/23/2020 after reported fall outside of her doctor's office when she lost her balance.  No reported loss of consciousness.  Cranial CT scan showed acute right cerebral convexity greater than falcine subdural hematoma.  12 mm leftward midline shift.  No definite ventricle trapping.    CT cervical spine negative for acute fracture or subluxation.  CT abdomen pelvis no evidence of acute traumatic injury.  Admission chemistries urinalysis negative nitrite urine screen negative, chemistries unremarkable aside calcium 8.6 WBC 10,800.  Follow-up neurosurgery for SDH underwent right frontal parietal craniotomy for evacuation of subdural hematoma 08/23/2020 per Dr. Kathyrn Sheriff.  EEG negative for seizure.  Maintained on Keppra for seizure prophylaxis.  Currently on a mechanical soft thin liquid diet as well as a nasogastric tube had been in place for nutritional support.  Bouts of urinary retention placed on low-dose Urecholine     Past Medical History      Past Medical History:  Diagnosis Date  . Anemia      in the past  . Anxiety      yrs. ago panic attacks  . Arthritis    . Cancer ([place])      small in ovary - had total hysterestomy  . Complication of anesthesia      pt. reported her father became deaf after having anesthesia  . Fibromyalgia    . GERD (gastroesophageal reflux disease)    . Heart murmur      due to rheumatic fever, PCP no longer hears it  . Hyperlipidemia    . Hypertension    . Myalgia    . Neuropathy    . PAD (peripheral artery disease) ([place])    . PAD (peripheral artery  disease) ([place])    . Pre-diabetes    . Pyelonephritis 09/10/2019     "septic"  . Tobacco abuse    . Tuberculosis      exposed, has to have chest xray      Family History  family history includes Ovarian cancer in her mother.   Prior Rehab/Hospitalizations:  Has the patient had prior rehab or hospitalizations prior to admission? Yes   Has the patient had major surgery during 100 days prior to admission? Yes   Current Medications    Current Facility-Administered Medications:  .  0.9 %  sodium chloride infusion, , Intravenous, Continuous, Costella, Vista Mink, PA-C, Last Rate: 75 mL/hr at 08/31/20 2011, New Bag at 08/31/20 2011 .  0.9 %  sodium chloride infusion, , Intravenous, PRN, Consuella Lose, MD, Stopped at 08/24/20 1319 .  acetaminophen (TYLENOL) tablet 650 mg, 650 mg, Oral, Q4H PRN, 650 mg at 08/31/20 1441 **OR** acetaminophen (TYLENOL) suppository 650 mg, 650 mg, Rectal, Q4H PRN, Costella, Vincent J, PA-C .  amitriptyline (ELAVIL) tablet 50 mg, 50 mg, Per Tube, QHS, Consuella Lose, MD, 50 mg at 08/31/20 2202 .  amLODipine (NORVASC) tablet 10 mg, 10 mg, Per Tube, Daily, Olalere, Adewale A, MD, 10 mg at 09/01/20 1008 .  atorvastatin (LIPITOR) tablet 80 mg, 80 mg, Per Tube, Daily, Consuella Lose, MD, 80 mg at 09/01/20 1012 .  bethanechol (URECHOLINE) tablet 10 mg, 10 mg, Per Tube, TID, Olalere, Adewale A, MD, 10 mg at 09/01/20 1011 .  bisacodyl (DULCOLAX) EC tablet 5 mg, 5 mg, Oral, Daily PRN, Costella, Vincent J, PA-C .  chlorhexidine (PERIDEX) 0.12 % solution 15 mL, 15 mL, Mouth Rinse, BID, Bergman, Meghan D, NP, 15 mL at 09/01/20 1013 .  Chlorhexidine Gluconate Cloth 2 % PADS 6 each, 6  each, Topical, Daily, Consuella Lose, MD, 6 each at 08/31/20 [phone] .  feeding supplement (OSMOLITE 1.2 CAL) liquid 720 mL, 720 mL, Per Tube, Continuous, Consuella Lose, MD, Last Rate: 60 mL/hr at 08/31/20 2011, 720 mL at 08/31/20 2011 .  hydrALAZINE (APRESOLINE) injection 20 mg, 20  mg, Intravenous, Q4H PRN, Olalere, Adewale A, MD, 20 mg at 08/28/20 1805 .  HYDROcodone-acetaminophen (NORCO/VICODIN) 5-325 MG per tablet 1 tablet, 1 tablet, Per Tube, Q4H PRN, Consuella Lose, MD, 1 tablet at 08/31/20 2203 .  HYDROmorphone (DILAUDID) injection 0.5-1 mg, 0.5-1 mg, Intravenous, Q2H PRN, Costella, Vincent J, PA-C, 1 mg at 08/28/20 0445 .  levETIRAcetam (KEPPRA) 100 MG/ML solution 1,000 mg, 1,000 mg, Per Tube, BID, Consuella Lose, MD, 1,000 mg at 08/31/20 2202 .  MEDLINE mouth rinse, 15 mL, Mouth Rinse, q12n4p, Bergman, Meghan D, NP, 15 mL at 08/31/20 1441 .  metoprolol tartrate (LOPRESSOR) 25 mg/10 mL oral suspension 25 mg, 25 mg, Per Tube, BID, Consuella Lose, MD, 25 mg at 09/01/20 1013 .  naloxone (NARCAN) injection 0.08 mg, 0.08 mg, Intravenous, PRN, Costella, Vincent J, PA-C .  ondansetron (ZOFRAN) tablet 4 mg, 4 mg, Oral, Q4H PRN **OR** ondansetron (ZOFRAN) injection 4 mg, 4 mg, Intravenous, Q4H PRN, Costella, Vincent J, PA-C .  pantoprazole sodium (PROTONIX) 40 mg/20 mL oral suspension 40 mg, 40 mg, Per Tube, QHS, Consuella Lose, MD, 40 mg at 08/31/20 2202 .  polyethylene glycol (MIRALAX / GLYCOLAX) packet 17 g, 17 g, Oral, Daily PRN, Costella, Vincent J, PA-C, 17 g at 08/29/20 0429 .  pregabalin (LYRICA) capsule 100 mg, 100 mg, Per Tube, BID, Bowser, Grace E, NP, 100 mg at 09/01/20 1013 .  promethazine (PHENERGAN) tablet 12.5-25 mg, 12.5-25 mg, Oral, Q4H PRN, Costella, Vista Mink, PA-C .  senna (SENOKOT) tablet 8.6 mg, 1 tablet, Per Tube, BID, Consuella Lose, MD, 8.6 mg at 08/31/20 2202 .  sodium phosphate (FLEET) 7-19 GM/118ML enema 1 enema, 1 enema, Rectal, Once PRN, Costella, Vista Mink, PA-C   Patients Current Diet:     Diet Order                      DIET DYS 3 Room service appropriate? Yes; Fluid consistency: Thin  Diet effective now                      Precautions / Restrictions Precautions Precautions: Fall Precaution Comments: cortrak, R  crani Restrictions Weight Bearing Restrictions: No    Has the patient had 2 or more falls or a fall with injury in the past year?Yes   Prior Activity Level Community (5-7x/wk): very independent and driving   Prior Functional Level Prior Function Level of Independence: Independent Comments: Independent without AD   Self Care: Did the patient need help bathing, dressing, using the toilet or eating?  Independent   Indoor Mobility: Did the patient need assistance with walking from room to room (with or without device)? Independent   Stairs: Did the patient need assistance with internal or external stairs (with or without device)? Independent   Functional Cognition: Did the patient need help planning regular tasks such as shopping or remembering to take medications? Independent   Home Assistive Devices / Equipment Home Assistive Devices/Equipment: None   Prior Device Use: Indicate devices/aids used by the patient prior to current illness, exacerbation or injury? None of the above   Current Functional Level Cognition   Arousal/Alertness: Awake/alert Overall Cognitive Status: Impaired/Different from baseline Difficult to assess  due to: Level of arousal Orientation Level: Oriented X4 General Comments: A&Ox4, very cheery throughout session. Pt is impulsive and requires cues to slow down especially with RW use. At one point, pt let go of her RW and continued to walk while trying to fix her mask, + LOB requiring PT correction. Attention: Focused,Sustained Focused Attention: Appears intact Sustained Attention: Impaired Sustained Attention Impairment: Verbal basic,Functional basic Memory: Impaired Memory Impairment: Decreased recall of new information,Decreased short term memory Decreased Short Term Memory: Verbal basic,Functional basic Awareness: Impaired Awareness Impairment: Intellectual impairment,Emergent impairment Problem Solving: Impaired Problem Solving Impairment: Verbal  basic,Functional basic [employer] Scales of Cognitive Functioning: Confused/inappropriate/non-agitated    Extremity Assessment (includes Sensation/Coordination)   Upper Extremity Assessment: LUE deficits/detail RUE Deficits / Details: pt appears to have limited shoulder ROM, difficult to fully assess given lethargy RUE Coordination: decreased gross motor LUE Deficits / Details: Increasing movement of L UE, 3/5 strength and some decreased coordination/proprioception when UE not in visual field LUE Sensation: decreased proprioception,decreased light touch LUE Coordination: decreased fine motor,decreased gross motor  Lower Extremity Assessment: Defer to PT evaluation RLE Deficits / Details: full AROM appreciated LLE Deficits / Details: demonstrating hypertonicity in hip and knee flexion, hip abd     ADLs   Overall ADL's : Needs assistance/impaired Lower Body Dressing: Maximal assistance,Sit to/from stand Lower Body Dressing Details (indicate cue type and reason): Max A for doffing soiled brief and donning new one. Toilet Transfer: Moderate assistance,+2 for physical assistance,+2 for safety/equipment,Stand-pivot,BSC,RW Toilet Transfer Details (indicate cue type and reason): Assistance to maintain balance with L LE weakness, cues for sequencing and manuevering RW Toileting- Clothing Manipulation and Hygiene: Maximal assistance,Sit to/from stand Toileting - Clothing Manipulation Details (indicate cue type and reason): Max A for posterior hygiene in standing and brief mgmt. Pt able to assist in standing by squatting and moving hips side to side General ADL Comments: Improved alertness and participation in ADLs, motivated to increase independence     Mobility   Overal bed mobility: Needs Assistance Bed Mobility: Supine to Sit Supine to sit: Min assist Sit to supine: Max assist,+2 for physical assistance,HOB elevated General bed mobility comments: min assist for trunk elevation, scooting  to EOB. pt moves LEs to EOB without PT assist     Transfers   Overall transfer level: Needs assistance Equipment used: Rolling walker (2 wheeled) Transfers: Sit to/from Stand Sit to Stand: Min assist,+2 safety/equipment Stand pivot transfers: Mod assist,+2 physical assistance,+2 safety/equipment General transfer comment: min assist x2 from EOB And from chair in hallway, assist for power up and steadying, as well as hand placement.     Ambulation / Gait / Stairs / Wheelchair Mobility   Ambulation/Gait Ambulation/Gait assistance: Min assist,+2 safety/equipment Gait Distance (Feet): 100 Feet (x2) Assistive device: Rolling walker (2 wheeled) Gait Pattern/deviations: Step-through pattern,Decreased stride length,Trunk flexed,Drifts right/left,Staggering right General Gait Details: min-mod assist to steady, physically manage RW from drifting R, slow pt forward progression, and steady during near LOB x2. Seated rest break x1 during gait, unsafe and fast descent into seated. Gait velocity: decr Gait velocity interpretation: <1.31 ft/sec, indicative of household ambulator     Posture / Balance Dynamic Sitting Balance Sitting balance - Comments: able to statically sit, min guard for safety Balance Overall balance assessment: Needs assistance Sitting-balance support: Single extremity supported,Feet supported,Bilateral upper extremity supported Sitting balance-Leahy Scale: Fair Sitting balance - Comments: able to statically sit, min guard for safety Postural control: Right lateral lean Standing balance support: Bilateral upper extremity supported,During  functional activity Standing balance-Leahy Scale: Poor Standing balance comment: reliant on B UE support     Special needs/care consideration CORTRAK        Previous Home Environment  Living Arrangements: Alone  Lives With: Alone Available Help at Discharge: Family,Available 24 hours/day (plans to go to daughters home in [place] or [place]  depending on functional level at discharge) Type of Home: Apartment Home Layout: One level Home Access: Level entry,Elevator Bathroom Shower/Tub: Tub/shower unit,Curtain Biochemist, clinical: Standard Bathroom Accessibility: Yes How Accessible: Accessible via walker [place]: No Additional Comments: verified with family   Discharge Living Setting Plans for Discharge Living Setting: House (plans for either [place] or [place] with one of her daughters) Does the patient have any problems obtaining your medications?: No    If 24/7 supervision needed at d/c, she will stay with Elray Mcgregor, daughter in [place], [place]. If intermittent supervision , she will stay with daughter, Rogers Seeds, in [place] Patient Roles: Parent Sport and exercise psychologist Information: Rogers Seeds daughter in [place] and [place] , daughter in [place] Anticipated Caregiver: daughters Anticipated Ambulance person Information: see above Ability/Limitations of Caregiver: Rogers Seeds in [place] works outside the home, [place] in [place] works from home Caregiver Availability: 24/7 Discharge Plan Discussed with Primary Caregiver: Yes Is Caregiver In Agreement with Plan?: Yes Does Caregiver/Family have Issues with Lodging/Transportation while Pt is in Rehab?: No   Daughters are coming to visit patient while on rehab as well as her best friend, [place]. They would like ultimately for her to move to [facility].   I have dicussed with both daughters the need for PCP follow up set up prior to her d/c as well as to check into medicare advantage plans and Medicaid in the area that she will be discharged to . They request assistance in getting connected with PCP and having her medical information provided to them prior to her d/c from CIR.   Goals Patient/Family Goal for Rehab: Mod I ot supervision wiht PT, OT, and SLP Expected length of stay: ELOS 10 to 14 days Additional Information: Patient will need PCP set up in  either [place] or [place] Pt/Family Agrees to Admission and willing to participate: Yes Program Orientation Provided & Reviewed with Pt/Caregiver Including Roles  & Responsibilities: Yes   Decrease burden of Care through IP rehab admission: n/a   Possible need for SNF placement upon discharge:not anticipated   Patient Condition: This patient's medical and functional status have not changed since the consult dated: 08/30/2020 in which the Rehabilitation Physician determined and documented that the patient's condition is appropriate for intensive rehabilitative care in an inpatient rehabilitation facility.  Functional changes are: overall min assist. After evaluating the patient today and speaking with the Rehabilitation physician and acute team, the patient remains appropriate for inpatient rehab. Will admit to inpatient rehab today.   Preadmission Screen Completed By:  Cleatrice Burke, RN, 09/01/2020 11:18 AM ______________________________________________________________________   Discussed status with Dr. Dagoberto Ligas on 09/01/2020 at  1129 and received approval for admission today.   Admission Coordinator:  Cleatrice Burke, time [id number] Date 09/01/2020             Cosigned by: Courtney Heys, MD at 09/01/2020 12:01 PM    Revision History                    Note Details  Author Amy Gong, RN File Time 09/01/2020 11:30 AM  Author Type Rehab Admission Coordinator Status Signed  Last Editor Amy Gong,  RN Service Physical Medicine and Rehabilitation

## 2020-09-01 NOTE — Consult Note (Signed)
Medical Consultation   Amy Bruce  [id number]  DOB: 11/27/1950  DOA: 08/23/2020  PCP: London Pepper, MD    Requesting physician:  Reason for consultation: Multiple medical problems   History of Present Illness:  Amy Bruce is a 69 yo BF w/ chronic back pain, HLD, tobacco use, PAD on aspirin, plavix presenting after fall. She is intubated currently. History obtained via chart review.  Amy Bruce was in her usual state of health until earlier today when she was coming out of her cardiologist's office when she slipped and fell on a metal pipe. She hit her head without loss of consciousness and was urgently brought to [facility] for evaluation. She began to be more confused, somnolent in the ED and was found to have subdural hemorrhage with midline shift on CT head. She was intubated and neurosurgery emergently brought her to OR for evacuation with Dr.Nundkumar.   12/22 afebrile overnight patient states ate most of her breakfast, as well as lunch. 12/23 afebrile overnight patient states ate all of her breakfast most of her lunch, drink the boost and multiple juices in between meals.  Covid vaccination; vaccinated Review of Systems:  Review of Systems  Constitutional: Negative.   HENT: Negative.   Eyes: Negative.   Respiratory: Negative.   Cardiovascular: Negative.   Gastrointestinal: Negative.   Genitourinary: Negative.   Musculoskeletal: Negative.   Skin: Negative.   Neurological: Negative.   Endo/Heme/Allergies: Negative.   Psychiatric/Behavioral: Negative.      Past Medical History: Past Medical History:  Diagnosis Date  . Anemia    in the past  . Anxiety    yrs. ago panic attacks  . Arthritis   . 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 Complication of anesthesia    pt. reported her father became deaf after having anesthesia  . Fibromyalgia   . GERD (gastroesophageal reflux disease)   . Heart murmur    due to rheumatic fever,  PCP no longer hears it  . Hyperlipidemia   . Hypertension   . Myalgia   . Neuropathy   . PAD (peripheral artery disease) ([place])   . PAD (peripheral artery disease) ([place])   . Pre-diabetes   . Pyelonephritis 09/10/2019    "septic"  . Tobacco abuse   . 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 ABDOMINAL AORTOGRAM W/LOWER EXTREMITY N/A 04/02/2018   Procedure: ABDOMINAL AORTOGRAM W/LOWER EXTREMITY;  Surgeon: Wellington Hampshire, MD;  Location: [place] [facility];  Service: Cardiovascular;  Laterality: N/A;  . ABDOMINAL AORTOGRAM W/LOWER EXTREMITY N/A 09/23/2019   Procedure: ABDOMINAL AORTOGRAM W/LOWER EXTREMITY;  Surgeon: Wellington Hampshire, MD;  Location: [place] [facility];  Service: Cardiovascular;  Laterality: N/A;  . ABDOMINAL HYSTERECTOMY  1999  . CERVICAL SPINE SURGERY  [id number]   C5/6 6/7  . COLONOSCOPY    . CRANIOTOMY Right 08/23/2020   Procedure: RIGHT CRANIOTOMY HEMATOMA EVACUATION SUBDURAL;  Surgeon: Consuella Lose, MD;  Location: [place];  Service: Neurosurgery;  Laterality: Right;  . ECTOPIC PREGNANCY SURGERY    . Left axillary cyst removal 1989    . MULTIPLE EXTRACTIONS WITH ALVEOLOPLASTY N/A 10/25/2017   Procedure: EXTRACTION NUMBERS FIVE, SEVEN, EIGHT, NINE, TEN, FIFTEEN, EIGHTEEN WITH ALVEOLOPLASTY, IRRIGATION AND DEBRIDEMENT LEFT SUBMANDIBULAR INFECTION;  Surgeon: Diona Browner, DDS;  Location: [facility]  OR;  Service: Oral Surgery;  Laterality: N/A;  . PERIPHERAL VASCULAR ATHERECTOMY Right 09/23/2019   Procedure: PERIPHERAL VASCULAR ATHERECTOMY;  Surgeon: Wellington Hampshire, MD;  Location: [place] [facility];  Service: Cardiovascular;  Laterality: Right;  SFA  . PERIPHERAL VASCULAR BALLOON ANGIOPLASTY Right 09/23/2019   Procedure: PERIPHERAL VASCULAR BALLOON ANGIOPLASTY;  Surgeon: Wellington Hampshire, MD;  Location: [place] [facility];  Service: Cardiovascular;  Laterality: Right;  SFA  . RADIOLOGY  WITH ANESTHESIA N/A 11/18/2018   Procedure: MRI OF THE CERVICAL SPINE WITHOUT CONTRAST;  Surgeon: Radiologist, Medication, MD;  Location: [place];  Service: Radiology;  Laterality: N/A;  . RADIOLOGY WITH ANESTHESIA N/A 12/03/2019   Procedure: MRI WITH ANESTHESIA CERVICAL WITH AND WITHOUT CONTRAST;  Surgeon: Radiologist, Medication, MD;  Location: [place];  Service: Radiology;  Laterality: N/A;  . TONSILECTOMY, ADENOIDECTOMY, BILATERAL MYRINGOTOMY AND TUBES  1981   put not aware of tubes being put in ears  . TUBAL LIGATION  1981     Allergies:   Allergies  Allergen Reactions  . Carisoprodol Swelling  . Duloxetine Hcl Swelling and Hypertension     Social History:  reports that she has been smoking cigarettes. She has a 33.00 pack-year smoking history. She has never used smokeless tobacco. She reports current alcohol use of about 2.0 standard drinks of alcohol per week. She reports that she does not use drugs.   Family History: Family History  Problem Relation Age of Onset  . Ovarian cancer Mother       Procedures/Significant Events:  08/23/20 R craniotomy and drain placement 12/15 extubated   I have personally reviewed and interpreted all radiology studies and my findings are as above.  VENTILATOR SETTINGS:   Cultures   Antimicrobials: Anti-infectives (From admission, onward)   Start     Ordered Stop   08/23/20 2100  ceFAZolin (ANCEF) IVPB 2g/100 mL premix        08/23/20 1444 08/24/20 0709   08/23/20 1444  ceFAZolin (ANCEF) 3 g in dextrose 5 % 50 mL IVPB  Status:  Discontinued        08/23/20 1444 08/23/20 1447       Devices    LINES / TUBES:      Continuous Infusions: . sodium chloride 75 mL/hr at 08/31/20 2011  . sodium chloride Stopped (08/24/20 1319)  . feeding supplement (OSMOLITE 1.2 CAL) 720 mL (08/31/20 2011)     Physical Exam: Vitals:   09/01/20 0500 09/01/20 0708 09/01/20 1008 09/01/20 1210  BP:  135/71 (!) 143/69 (!) 149/53  Pulse:  80  72   Resp:  18  19  Temp:  98.4 F (36.9 C)  98.7 F (37.1 C)  TempSrc:  Oral  Oral  SpO2:  96%  97%  Weight: 75.9 kg     Height:        General: A/O x4, No acute respiratory distress Eyes: negative scleral hemorrhage, negative anisocoria, negative icterus ENT: Negative Runny nose, negative gingival bleeding, Neck:  Negative scars, masses, torticollis, lymphadenopathy, JVD Lungs: Clear to auscultation bilaterally without wheezes or crackles Cardiovascular: Regular rate and rhythm without murmur gallop or rub normal S1 and S2 Abdomen: negative abdominal pain, nondistended, positive soft, bowel sounds, no rebound, no ascites, no appreciable mass Extremities: No significant cyanosis, clubbing, or edema bilateral lower extremities Skin: Stable line along right frontal, parietal, occipital area dry and clean no sign of infection. Psychiatric:  Negative depression, negative anxiety, negative fatigue, negative mania  Central nervous system:  Cranial nerves II through XII intact, tongue/uvula midline, all extremities muscle strength 5/5, sensation intact throughout, negative dysarthria, negative expressive aphasia, negative receptive aphasia.   Data reviewed:  I have personally reviewed following labs and imaging studies Labs:  CBC: Recent Labs  Lab 08/27/20 0719 08/31/20 0135 09/01/20 0206  WBC 8.6 8.0 8.8  NEUTROABS  --  4.0 5.0  HGB 11.1* 9.5* 9.5*  HCT 32.6* 28.1* 29.5*  MCV 85.3 85.2 86.3  PLT 231 279 [id number]    Basic Metabolic Panel: Recent Labs  Lab 08/27/20 0429 08/28/20 0237 08/30/20 0423 08/31/20 0135 09/01/20 0206  NA 137 139 138 137 139  K 5.8* 3.2* 3.5 3.6 3.7  CL 106 103 101 102 103  CO2 21* 24 26 27 27   GLUCOSE 132* 150* 122* 134* 134*  BUN 10 8 8 10 10   CREATININE 0.67 0.64 0.68 0.75 0.73  CALCIUM 8.3* 8.9 9.0 8.8* 8.9  MG 2.3  --   --  2.2 2.3  PHOS 1.7* 2.8 3.6 3.8 3.7   GFR Estimated Creatinine Clearance: 59.6 mL/min (by C-G formula based on SCr of 0.73  mg/dL). Liver Function Tests: Recent Labs  Lab 08/31/20 0135 09/01/20 0206  AST 17 15  ALT 23 23  ALKPHOS 61 60  BILITOT 0.1* 0.4  PROT 5.6* 5.8*  ALBUMIN 2.4* 2.5*   No results for input(s): LIPASE, AMYLASE in the last 168 hours. No results for input(s): AMMONIA in the last 168 hours. Coagulation profile No results for input(s): INR, PROTIME in the last 168 hours.  Cardiac Enzymes: No results for input(s): CKTOTAL, CKMB, CKMBINDEX, TROPONINI in the last 168 hours. BNP: Invalid input(s): POCBNP CBG: Recent Labs  Lab 08/31/20 1610 08/31/20 2018 08/31/20 2335 09/01/20 0402 09/01/20 0811  GLUCAP 115* 120* 128* 148* 117*   D-Dimer No results for input(s): DDIMER in the last 72 hours. Hgb A1c No results for input(s): HGBA1C in the last 72 hours. Lipid Profile No results for input(s): CHOL, HDL, LDLCALC, TRIG, CHOLHDL, LDLDIRECT in the last 72 hours. Thyroid function studies No results for input(s): TSH, T4TOTAL, T3FREE, THYROIDAB in the last 72 hours.  Invalid input(s): FREET3 Anemia work up No results for input(s): VITAMINB12, FOLATE, FERRITIN, TIBC, IRON, RETICCTPCT in the last 72 hours. Urinalysis    Component Value Date/Time   COLORURINE YELLOW 08/23/2020 [place] 08/23/2020 1437   LABSPEC 1.036 (H) 08/23/2020 1437   PHURINE 6.0 08/23/2020 [place] 08/23/2020 1437   HGBUR NEGATIVE 08/23/2020 [place] 08/23/2020 [place] 08/23/2020 [place] 08/23/2020 1437   NITRITE NEGATIVE 08/23/2020 [place] 08/23/2020 1437     Microbiology Recent Results (from the past 240 hour(s))  Resp Panel by RT-PCR (Flu A&B, Covid) Nasopharyngeal Swab     Status: None   Collection Time: 08/23/20 11:22 AM   Specimen: Nasopharyngeal Swab; Nasopharyngeal(NP) swabs in vial transport medium  Result Value Ref Range Status   SARS Coronavirus 2 by RT PCR NEGATIVE NEGATIVE Final     Comment: (NOTE) SARS-CoV-2 target nucleic acids are NOT DETECTED.  The SARS-CoV-2 RNA is generally detectable in upper respiratory specimens during the acute phase of infection. The lowest concentration of SARS-CoV-2 viral copies this assay can detect is 138 copies/mL. A negative result does not preclude SARS-Cov-2 infection and should not be used as the sole basis for treatment or other patient management decisions. A negative result may occur with  improper specimen collection/handling, submission of specimen other than nasopharyngeal swab, presence of viral mutation(s) within the areas targeted by this assay, and inadequate number of viral copies(<138 copies/mL). A negative result must be combined with clinical observations, patient history, and epidemiological information. The expected result is Negative.  Fact Sheet for Patients:  EntrepreneurPulse.com.au  Fact Sheet for Healthcare Providers:  IncredibleEmployment.be  This test is no t yet approved or cleared by the [place] FDA and  has been authorized for detection and/or diagnosis of SARS-CoV-2 by FDA under an Emergency Use Authorization (EUA). This EUA will remain  in effect (meaning this test can be used) for the duration of the COVID-19 declaration under Section 564(b)(1) of the Act, 21 U.S.C.section 360bbb-3(b)(1), unless the authorization is terminated  or revoked sooner.       Influenza A by PCR NEGATIVE NEGATIVE Final   Influenza B by PCR NEGATIVE NEGATIVE Final    Comment: (NOTE) The Xpert Xpress SARS-CoV-2/FLU/RSV plus assay is intended as an aid in the diagnosis of influenza from Nasopharyngeal swab specimens and should not be used as a sole basis for treatment. Nasal washings and aspirates are unacceptable for Xpert Xpress SARS-CoV-2/FLU/RSV testing.  Fact Sheet for Patients: EntrepreneurPulse.com.au  Fact Sheet for Healthcare  Providers: IncredibleEmployment.be  This test is not yet approved or cleared by the [place] FDA and has been authorized for detection and/or diagnosis of SARS-CoV-2 by FDA under an Emergency Use Authorization (EUA). This EUA will remain in effect (meaning this test can be used) for the duration of the COVID-19 declaration under Section 564(b)(1) of the Act, 21 U.S.C. section 360bbb-3(b)(1), unless the authorization is terminated or revoked.  Performed at [facility], [place] [address] [place], [place] [address]   MRSA PCR Screening     Status: None   Collection Time: 08/23/20  2:34 PM   Specimen: Nasal Mucosa; Nasopharyngeal  Result Value Ref Range Status   MRSA by PCR NEGATIVE NEGATIVE Final    Comment:        The GeneXpert MRSA Assay (FDA approved for NASAL specimens only), is one component of a comprehensive MRSA colonization surveillance program. It is not intended to diagnose MRSA infection nor to guide or monitor treatment for MRSA infections. Performed at [facility], [place] [address] [place],  [address]        Inpatient Medications:   Scheduled Meds: . amitriptyline  50 mg Per Tube QHS  . amLODipine  10 mg Per Tube Daily  . atorvastatin  80 mg Per Tube Daily  . bethanechol  10 mg Per Tube TID  . chlorhexidine  15 mL Mouth Rinse BID  . Chlorhexidine Gluconate Cloth  6 each Topical Daily  . feeding supplement  237 mL Oral BID BM  . levETIRAcetam  1,000 mg Per Tube BID  . mouth rinse  15 mL Mouth Rinse q12n4p  . metoprolol tartrate  25 mg Per Tube BID  . multivitamin with minerals  1 tablet Oral Daily  . pantoprazole sodium  40 mg Per Tube QHS  . pregabalin  100 mg Per Tube BID  . senna  1 tablet Per Tube BID   Continuous Infusions: . sodium chloride 75 mL/hr at 08/31/20 2011  . sodium chloride Stopped (08/24/20 1319)  . feeding supplement (OSMOLITE 1.2 CAL) 720 mL (08/31/20 2011)     Radiological Exams on  Admission: No results found.  Impression/Recommendations Active Problems:   Subdural hematoma (HCC)  RIGHT Subdural Hematoma -Per neurosurgery  Acute Encephalopathy - Secondary to large acute subdural hematoma -Resolved.  -Continue delirium precautions -PT/OT; recommend CIR  Acute on chronic pain-- -Currently well controlled we will make no changes to current medication.    Inadequate p.o. intake -Core track in place. -12/21 begin calorie count -Continue dysphagia 3 diet -12/22 patient continues to consume her meals as reported most likely will pass her calorie count and be able to remove her core track tube -12/23 remove core track tube prior to patient discharging to CIR  PAD -Antiplatelets on hold  Essential HTN -Amlodipine 10 mg daily -Hydralazine PRN -Metoprolol 25 mg BID -12/23 BP still mildly elevated however patient now going over to CIR will allow them to adjust medication as patient becomes more active  Thank you for this consultation.  Our [facility] hospitalist team will sign off.   Time Spent: 85min  Oda Placke J M.D. [facility] 09/01/2020, 4:27 PM  [id number]

## 2020-09-02 ENCOUNTER — Inpatient Hospital Stay (HOSPITAL_COMMUNITY): Payer: Medicare Other

## 2020-09-02 ENCOUNTER — Inpatient Hospital Stay (HOSPITAL_COMMUNITY): Payer: Medicare Other | Admitting: Physical Therapy

## 2020-09-02 LAB — COMPREHENSIVE METABOLIC PANEL
ALT: 26 U/L (ref 0–44)
AST: 16 U/L (ref 15–41)
Albumin: 2.7 g/dL — ABNORMAL LOW (ref 3.5–5.0)
Alkaline Phosphatase: 61 U/L (ref 38–126)
Anion gap: 10 (ref 5–15)
BUN: 12 mg/dL (ref 8–23)
CO2: 29 mmol/L (ref 22–32)
Calcium: 9.8 mg/dL (ref 8.9–10.3)
Chloride: 99 mmol/L (ref 98–111)
Creatinine, Ser: 0.73 mg/dL (ref 0.44–1.00)
GFR, Estimated: >60 mL/min (ref >60)
Glucose, Bld: 130 mg/dL — ABNORMAL HIGH (ref 70–99)
Potassium: 4 mmol/L (ref 3.5–5.1)
Sodium: 138 mmol/L (ref 135–145)
Total Bilirubin: 0.3 mg/dL (ref 0.3–1.2)
Total Protein: 6.4 g/dL — ABNORMAL LOW (ref 6.5–8.1)

## 2020-09-02 LAB — CBC WITH DIFFERENTIAL/PLATELET
Abs Immature Granulocytes: 0.15 10*3/uL — ABNORMAL HIGH (ref 0.00–0.07)
Basophils Absolute: 0.1 10*3/uL (ref 0.0–0.1)
Basophils Relative: 1 %
Eosinophils Absolute: 0.1 10*3/uL (ref 0.0–0.5)
Eosinophils Relative: 1 %
HCT: 32.5 % — ABNORMAL LOW (ref 36.0–46.0)
Hemoglobin: 10.6 g/dL — ABNORMAL LOW (ref 12.0–15.0)
Immature Granulocytes: 2 %
Lymphocytes Relative: 28 %
Lymphs Abs: 2.9 10*3/uL (ref 0.7–4.0)
MCH: 27.9 pg (ref 26.0–34.0)
MCHC: 32.6 g/dL (ref 30.0–36.0)
MCV: 85.5 fL (ref 80.0–100.0)
Monocytes Absolute: 1.2 10*3/uL — ABNORMAL HIGH (ref 0.1–1.0)
Monocytes Relative: 11 %
Neutro Abs: 5.9 10*3/uL (ref 1.7–7.7)
Neutrophils Relative %: 57 %
Platelets: 341 10*3/uL (ref 150–400)
RBC: 3.8 MIL/uL — ABNORMAL LOW (ref 3.87–5.11)
RDW: 15.9 % — ABNORMAL HIGH (ref 11.5–15.5)
WBC: 10.3 10*3/uL (ref 4.0–10.5)
nRBC: 0 % (ref 0.0–0.2)

## 2020-09-02 MED ORDER — LIDOCAINE 5 % EX PTCH
1.0000 | MEDICATED_PATCH | CUTANEOUS | Status: DC
  Administered 2020-09-05 – 2020-09-16 (×10): 1 via TRANSDERMAL
  Filled 2020-09-02 (×10): qty 1

## 2020-09-02 MED ORDER — PANTOPRAZOLE SODIUM 40 MG PO TBEC
40.0000 mg | DELAYED_RELEASE_TABLET | Freq: Every day | ORAL | Status: DC
  Administered 2020-09-03 – 2020-09-16 (×13): 40 mg via ORAL
  Filled 2020-09-02 (×13): qty 1

## 2020-09-02 MED ORDER — ADULT MULTIVITAMIN W/MINERALS CH
1.0000 | ORAL_TABLET | Freq: Every day | ORAL | Status: DC
  Administered 2020-09-02 – 2020-09-17 (×16): 1 via ORAL
  Filled 2020-09-02 (×15): qty 1

## 2020-09-02 MED ORDER — BETHANECHOL CHLORIDE 10 MG PO TABS
10.0000 mg | ORAL_TABLET | Freq: Three times a day (TID) | ORAL | Status: DC
  Administered 2020-09-03 – 2020-09-17 (×43): 10 mg via ORAL
  Filled 2020-09-02 (×43): qty 1

## 2020-09-02 MED ORDER — ENSURE ENLIVE PO LIQD
237.0000 mL | Freq: Three times a day (TID) | ORAL | Status: DC
  Administered 2020-09-02 – 2020-09-17 (×33): 237 mL via ORAL

## 2020-09-02 MED ORDER — HYDROCODONE-ACETAMINOPHEN 5-325 MG PO TABS
1.0000 | ORAL_TABLET | ORAL | Status: DC | PRN
Start: 2020-09-02 — End: 2020-09-17
  Administered 2020-09-03 – 2020-09-14 (×12): 1 via ORAL
  Filled 2020-09-02 (×13): qty 1

## 2020-09-02 MED ORDER — METHOCARBAMOL 750 MG PO TABS
750.0000 mg | ORAL_TABLET | Freq: Four times a day (QID) | ORAL | Status: DC | PRN
  Filled 2020-09-02: qty 1

## 2020-09-02 MED ORDER — PREGABALIN 50 MG PO CAPS
100.0000 mg | ORAL_CAPSULE | Freq: Two times a day (BID) | ORAL | Status: DC
  Administered 2020-09-03 – 2020-09-17 (×28): 100 mg via ORAL
  Filled 2020-09-02 (×29): qty 2

## 2020-09-02 MED ORDER — SENNA 8.6 MG PO TABS
1.0000 | ORAL_TABLET | Freq: Two times a day (BID) | ORAL | Status: DC
  Administered 2020-09-03 – 2020-09-17 (×28): 8.6 mg via ORAL
  Filled 2020-09-02 (×28): qty 1

## 2020-09-02 MED ORDER — METOPROLOL TARTRATE 25 MG PO TABS
25.0000 mg | ORAL_TABLET | Freq: Two times a day (BID) | ORAL | Status: DC
Start: 2020-09-03 — End: 2020-09-17
  Administered 2020-09-03 – 2020-09-17 (×28): 25 mg via ORAL
  Filled 2020-09-02 (×28): qty 1

## 2020-09-02 MED ORDER — LEVETIRACETAM 500 MG PO TABS
1000.0000 mg | ORAL_TABLET | Freq: Two times a day (BID) | ORAL | Status: DC
  Administered 2020-09-03 – 2020-09-17 (×28): 1000 mg via ORAL
  Filled 2020-09-02 (×28): qty 2

## 2020-09-02 MED ORDER — METOPROLOL TARTRATE 25 MG PO TABS: 25.0000 mg | ORAL_TABLET | Freq: Two times a day (BID) | ORAL | Status: DC

## 2020-09-02 MED ORDER — AMLODIPINE BESYLATE 10 MG PO TABS
10.0000 mg | ORAL_TABLET | Freq: Every day | ORAL | Status: DC
  Administered 2020-09-03 – 2020-09-17 (×15): 10 mg via ORAL
  Filled 2020-09-02 (×15): qty 1

## 2020-09-02 MED ORDER — AMITRIPTYLINE HCL 50 MG PO TABS
50.0000 mg | ORAL_TABLET | Freq: Every day | ORAL | Status: DC
  Administered 2020-09-03 – 2020-09-05 (×2): 50 mg via ORAL
  Filled 2020-09-02 (×2): qty 1

## 2020-09-02 MED ORDER — ATORVASTATIN CALCIUM 80 MG PO TABS
80.0000 mg | ORAL_TABLET | Freq: Every day | ORAL | Status: DC
  Administered 2020-09-03 – 2020-09-17 (×15): 80 mg via ORAL
  Filled 2020-09-02 (×11): qty 1
  Filled 2020-09-02: qty 2
  Filled 2020-09-02 (×3): qty 1

## 2020-09-02 NOTE — Progress Notes (Signed)
Initial Nutrition Assessment  DOCUMENTATION CODES:   Obesity unspecified  INTERVENTION:   -Ensure Enlive po TID, each supplement provides 350 kcal and 20 grams of protein -MVI with minerals daily -D/c Osmolite 1.2, as pt no longer with feeding access  NUTRITION DIAGNOSIS:   Inadequate oral intake related to decreased appetite as evidenced by per patient/family report.  GOAL:   Patient will meet greater than or equal to 90% of their needs  MONITOR:   PO intake,Supplement acceptance,Diet advancement,Labs,Weight trends,Skin,I & O's  REASON FOR ASSESSMENT:   Consult Enteral/tube feeding initiation and management  ASSESSMENT:   Amy Bruce is a 69 year old right-handed female with history of anxiety, cervical fusion 04/2020, obesity with BMI 32.68, prediabetes, fibromyalgia, hyperlipidemia, hypertension, PAD maintained on aspirin and Plavix as well as history of tobacco use.  Per chart review patient lives alone in [place].  Reportedly independent prior to admission.  She plans to stay with her daughter in [place] on discharge.  Presented 08/23/2020 after reported fall outside of her doctor's office when she lost her balance.  No reported loss of consciousness.  Cranial CT scan showed acute right cerebral convexity greater than falcine subdural hematoma.  12 mm leftward midline shift.  No definite ventricle trapping.    CT cervical spine negative for acute fracture or subluxation.  CT abdomen pelvis no evidence of acute traumatic injury.  Admission chemistries urinalysis negative nitrite urine screen negative, chemistries unremarkable aside calcium 8.6 WBC 10,800.  Follow-up neurosurgery for SDH underwent right frontal parietal craniotomy for evacuation of subdural hematoma 08/23/2020 per Dr. Kathyrn Sheriff.  EEG negative for seizure.  Maintained on Keppra for seizure prophylaxis.  Currently on a mechanical soft thin liquid diet as well as a nasogastric tube had been in place for  nutritional support.  Bouts of urinary retention placed on low-dose Urecholine.  Due to patient's decrease in functional ability altered mental status she was admitted for an inpatient rehab comprehensive therapy course.  Pt admitted with mild lt sided weakness with altered mental status secondary to traumatic SDH.   12/14- s/p rt frontal parietal craniotomy evacuation of SDH 12/23- cortrak tube removed  Reviewed I/O's: +120 ml x 24 hours  Pt sitting up in bed, pleasant and in good spirits at time of visit. Pt states to this RD "I'm really happy to have that tube out of my nose".   Per pt, swallowing function and oral intake continue to improve daily. Per pt, she was able to consume some milk and greek yogurt today at breakfast. She notes that swallowing and eating are more comfortable after removal of cortrak yesterday. She really enjoys strawberry Ensure supplements, which RN provided to her at visit.   RD reviewed calorie count results with pt from inpatient admission (consuming >60% of meals on continuous TF with no supplements ordered).   Pt denies weight loss. Reviewed wt hx; pt has experienced a 6.6% wt loss over the past 6 months, which is not significant for time frame.   Discussed with pt importance of good meal and supplement intake to promote healing. Pt willing to continue Ensure supplements. She had no further questions or concerns, however, expressed appreciation for visit.   Medications reviewed and include senokot.   Labs reviewed: CBGS: 117-148.   NUTRITION - FOCUSED PHYSICAL EXAM:  Flowsheet Row Most Recent Value  Orbital Region No depletion  Upper Arm Region No depletion  Thoracic and Lumbar Region No depletion  Buccal Region No depletion  Temple Region No depletion  Clavicle  Bone Region No depletion  Clavicle and Acromion Bone Region No depletion  Scapular Bone Region No depletion  Dorsal Hand No depletion  Patellar Region No depletion  Anterior Thigh Region  No depletion  Posterior Calf Region No depletion  Edema (RD Assessment) None  Hair Reviewed  Eyes Reviewed  Mouth Reviewed  Skin Reviewed  Nails Reviewed       Diet Order:   Diet Order            DIET DYS 3 Room service appropriate? Yes; Fluid consistency: Thin  Diet effective now                 EDUCATION NEEDS:   Education needs have been addressed  Skin:  Skin Assessment: Skin Integrity Issues: Skin Integrity Issues:: Incisions Incisions: closed lt neck and rt head  Last BM:     Height:   Ht Readings from Last 1 Encounters:  09/01/20 5' (1.524 m)    Weight:   Wt Readings from Last 1 Encounters:  09/01/20 75.3 kg    Ideal Body Weight:  45.5 kg  BMI:  Body mass index is 32.4 kg/m.  Estimated Nutritional Needs:   Kcal:  1700-1900  Protein:  85-100 grams  Fluid:  > 1.7 L    Loistine Chance, RD, LDN, [place] Registered Dietitian II Certified Diabetes Care and Education Specialist Please refer to [facility] for RD and/or RD on-call/weekend/after hours pager

## 2020-09-02 NOTE — Evaluation (Signed)
Occupational Therapy Assessment and Plan  Patient Details  Name: Amy Bruce MRN: [id number] Date of Birth: 02-02-1951  OT Diagnosis: abnormal posture, acute pain, cognitive deficits, hemiplegia affecting dominant side, muscle weakness (generalized), pain in joint and swelling of limb Rehab Potential: Rehab Potential (ACUTE ONLY): Good ELOS: 1.5-2 weeks   Today's Date: 09/02/2020 OT Individual Time: 1400-1500 OT Individual Time Calculation (min): 60 min     Hospital Problem: Principal Problem:   Traumatic subdural hematoma ([facility])   Past Medical History:  Past Medical History:  Diagnosis Date   Anemia    in the past   Anxiety    yrs. 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, has to have chest xray   Past Surgical History:  Past Surgical History:  Procedure Laterality Date   ABDOMINAL AORTOGRAM W/LOWER EXTREMITY N/A 04/02/2018   Procedure: ABDOMINAL AORTOGRAM W/LOWER EXTREMITY;  Surgeon: Wellington Hampshire, MD;  Location: [facility];  Service: Cardiovascular;  Laterality: N/A;   ABDOMINAL AORTOGRAM W/LOWER EXTREMITY N/A 09/23/2019   Procedure: ABDOMINAL AORTOGRAM W/LOWER EXTREMITY;  Surgeon: Wellington Hampshire, MD;  Location: [facility];  Service: Cardiovascular;  Laterality: N/A;   ABDOMINAL HYSTERECTOMY  1999   CERVICAL SPINE SURGERY  [id number]   C5/6 6/7   COLONOSCOPY     CRANIOTOMY Right 08/23/2020   Procedure: RIGHT  CRANIOTOMY HEMATOMA EVACUATION SUBDURAL;  Surgeon: Consuella Lose, MD;  Location: [facility];  Service: Neurosurgery;  Laterality: Right;   ECTOPIC PREGNANCY SURGERY     Left axillary cyst removal 1989     MULTIPLE EXTRACTIONS WITH ALVEOLOPLASTY N/A 10/25/2017   Procedure: EXTRACTION NUMBERS FIVE, SEVEN, EIGHT, NINE, TEN, FIFTEEN, EIGHTEEN WITH ALVEOLOPLASTY, IRRIGATION AND DEBRIDEMENT LEFT SUBMANDIBULAR INFECTION;  Surgeon: Diona Browner, DDS;  Location: [facility];  Service: Oral Surgery;  Laterality: N/A;   PERIPHERAL VASCULAR ATHERECTOMY Right 09/23/2019   Procedure: PERIPHERAL VASCULAR ATHERECTOMY;  Surgeon: Wellington Hampshire, MD;  Location: [facility];  Service: Cardiovascular;  Laterality: Right;  SFA   PERIPHERAL VASCULAR BALLOON ANGIOPLASTY Right 09/23/2019   Procedure: PERIPHERAL VASCULAR BALLOON ANGIOPLASTY;  Surgeon: Wellington Hampshire, MD;  Location: [facility];  Service: Cardiovascular;  Laterality: Right;  SFA   RADIOLOGY WITH ANESTHESIA N/A 11/18/2018   Procedure: MRI OF THE CERVICAL SPINE WITHOUT CONTRAST;  Surgeon: Radiologist, Medication, MD;  Location: [facility];  Service: Radiology;  Laterality: N/A;   RADIOLOGY WITH ANESTHESIA N/A 12/03/2019   Procedure: MRI WITH ANESTHESIA CERVICAL WITH AND WITHOUT CONTRAST;  Surgeon: Radiologist, Medication, MD;  Location: [facility];  Service: Radiology;  Laterality: N/A;   TONSILECTOMY, ADENOIDECTOMY, BILATERAL MYRINGOTOMY AND TUBES  1981   put not aware of tubes being put in ears   TUBAL LIGATION  1981    Assessment & Plan Clinical Impression: Amy Bruce is a 69 year old  right-handed female with history of anxiety, cervical fusion 04/2020, obesity with BMI 32.68, prediabetes, fibromyalgia, hyperlipidemia, hypertension, PAD maintained on aspirin and Plavix as well as history of tobacco use.  Per chart review patient lives alone in [place].  Reportedly independent prior to admission.  She plans to stay with her daughter in  [place] on discharge.  Presented 08/23/2020 after reported fall outside of her doctor's office when she lost her balance.  No reported loss of consciousness.  Cranial CT scan showed acute right cerebral convexity greater than falcine subdural hematoma.  12 mm leftward midline shift.  No definite ventricle trapping.    CT cervical spine negative for acute fracture or subluxation.  CT abdomen pelvis no evidence of acute traumatic injury.  Admission chemistries urinalysis negative nitrite urine screen negative, chemistries unremarkable aside calcium 8.6 WBC 10,800.  Follow-up neurosurgery for SDH underwent right frontal parietal craniotomy for evacuation of subdural hematoma 08/23/2020 per Dr. Kathyrn Sheriff.  EEG negative for seizure.  Maintained on Keppra for seizure prophylaxis.  Currently on a mechanical soft thin liquid diet as well as a nasogastric tube had been in place for nutritional support.  Bouts of urinary retention placed on low-dose Urecholine.  Due to patient's decrease in functional ability altered mental status she was admitted for an inpatient rehab comprehensive therapy course.   Patient currently requires mod with basic self-care skills secondary to muscle weakness, decreased cardiorespiratoy endurance, impaired timing and sequencing, unbalanced muscle activation, decreased coordination and decreased motor planning, decreased attention, decreased awareness, decreased problem solving, decreased safety awareness and decreased memory and decreased standing balance, decreased postural control, hemiplegia and decreased balance strategies.  Prior to hospitalization, patient could complete BADL/IADL with independent .  Patient will benefit from skilled intervention to decrease level of assist with basic self-care skills and increase independence with basic self-care skills prior to discharge home with care partner.  Anticipate patient will require 24 hour supervision and follow up home health.  OT -  End of Session Activity Tolerance: Tolerates 10 - 20 min activity with multiple rests Endurance Deficit: Yes OT Assessment Rehab Potential (ACUTE ONLY): Good OT Barriers to Discharge: Decreased caregiver support OT Barriers to Discharge Comments: Will need to go to daughters in [facility] if need 24/7 supervision OT Patient demonstrates impairments in the following area(s): Balance;Cognition;Edema;Endurance;Motor;Nutrition;Pain;Perception;Safety;Sensory;Skin Integrity OT Basic ADL's Functional Problem(s): Grooming;Bathing;Dressing;Toileting OT Transfers Functional Problem(s): Toilet;Tub/Shower OT Additional Impairment(s): Fuctional Use of Upper Extremity OT Plan OT Intensity: Minimum of 1-2 x/day, 45 to 90 minutes OT Frequency: 5 out of 7 days OT Duration/Estimated Length of Stay: 1.5-2 weeks OT Treatment/Interventions: Balance/vestibular training;Discharge planning;Pain management;Self Care/advanced ADL retraining;UE/LE Coordination activities;Therapeutic Activities;Visual/perceptual remediation/compensation;Therapeutic Exercise;Skin care/wound managment;Patient/family education;Functional mobility training;Disease mangement/prevention;Cognitive remediation/compensation;Community reintegration;DME/adaptive equipment instruction;Neuromuscular re-education;Psychosocial support;Splinting/orthotics;UE/LE Strength taining/ROM;Wheelchair propulsion/positioning OT Self Feeding Anticipated Outcome(s): S OT Basic Self-Care Anticipated Outcome(s): S OT Toileting Anticipated Outcome(s): S OT Bathroom Transfers Anticipated Outcome(s): S OT Recommendation Recommendations for Other Services: Neuropsych consult;Therapeutic Recreation consult Therapeutic Recreation Interventions: Pet therapy;Outing/community reintergration;Stress management Patient destination: Home Follow Up Recommendations: Home health OT Equipment Recommended: 3 in 1 bedside comode;To be determined;Tub/shower seat   OT  Evaluation Precautions/Restrictions  Precautions Precautions: Fall Restrictions Weight Bearing Restrictions: No General   Vital Signs Therapy Vitals Temp: 97.8 F (36.6 C) Pulse Rate: 72 Resp: 16 BP: 118/66 Patient Position (if appropriate): Lying Oxygen Therapy SpO2: 96 % O2 Device: Room Air Pain Pain Assessment Pain Scale: 0-10 Pain Score: 7  Pain Type: Acute pain Pain Location: Head Pain Orientation: Posterior Pain Descriptors /  Indicators: Headache Pain Onset: On-going Pain Intervention(s): Medication (See eMAR);Rest (pt reports she has just had pain meds and declined further nursing needs) Home Living/Prior Functioning Home Living Family/patient expects to be discharged to:: Private residence Living Arrangements: Alone Available Help at Discharge: Family,Available 24 hours/day (pt has two daughters invloved in care however reports one is in [facility] and one in [place]. pt plans to DC to one of them, if she does not need 24/7 supervision, pt reports she will stay in [place] with DTR) Type of Home: House Home Access: Level entry,Elevator Home Layout: One level Additional Comments: verified with family however pt reports one step to enter both houses, no railing  Lives With: Alone Prior Function Level of Independence: Independent with basic ADLs,Independent with homemaking with ambulation,Independent with gait,Independent with transfers  Able to Take Stairs?: No Driving: Yes Vocation: Retired Comments: Independent without AD Vision Baseline Vision/History: Wears glasses Wears Glasses: At all times Patient Visual Report: No change from baseline (per pt her vision is blurry because she does not have her glasses, daughter bringing them tomorrow) Vision Assessment?: Vision impaired- to be further tested in functional context Perception  Perception: Within Functional Limits Praxis Praxis: Intact Cognition Overall Cognitive Status: Impaired/Different from  baseline Arousal/Alertness: Awake/alert Orientation Level: Person;Place;Situation Person: Oriented Place: Oriented Situation: Oriented Year: 2021 Month: December Day of Week: Correct Memory: Impaired Memory Impairment: Decreased recall of new information;Decreased short term memory Immediate Memory Recall: Sock;Blue;Bed Memory Recall Sock: Without Cue Memory Recall Blue: Without Cue Memory Recall Bed: Without Cue Attention: Sustained Focused Attention: Appears intact Sustained Attention: Impaired Sustained Attention Impairment: Verbal complex;Functional complex Awareness: Impaired Awareness Impairment: Emergent impairment Problem Solving: Impaired Problem Solving Impairment: Verbal complex;Functional complex Safety/Judgment: Appears intact [employer] Scales of Cognitive Functioning: Automatic/appropriate Sensation Sensation Light Touch: Impaired by gross assessment (pt reports having baseline numbness and tingling in B UE and hands) Coordination Gross Motor Movements are Fluid and Coordinated: Yes Fine Motor Movements are Fluid and Coordinated: Yes Motor  Motor Motor: Within Functional Limits  Trunk/Postural Assessment  Cervical Assessment Cervical Assessment: Exceptions to [facility] (forward head carriage) Thoracic Assessment Thoracic Assessment: Exceptions to [facility] (mild kyphosis) Lumbar Assessment Lumbar Assessment: Exceptions to [facility] (posterior peliv tilt) Postural Control Postural Control: Deficits on evaluation  Balance Balance Balance Assessed: Yes Static Sitting Balance Static Sitting - Balance Support: Feet supported Static Sitting - Level of Assistance: 5: Stand by assistance Dynamic Sitting Balance Dynamic Sitting - Balance Support: Feet supported;During functional activity Dynamic Sitting - Level of Assistance: 4: Min assist Dynamic Sitting - Balance Activities: Reaching for objects;Forward lean/weight shifting;Lateral lean/weight shifting Static  Standing Balance Static Standing - Balance Support: Bilateral upper extremity supported Static Standing - Level of Assistance: 4: Min assist Dynamic Standing Balance Dynamic Standing - Balance Support: Bilateral upper extremity supported Dynamic Standing - Level of Assistance: 4: Min assist Extremity/Trunk Assessment RUE Assessment RUE Assessment: Exceptions to [facility] General Strength Comments: increased edema in thumb from previous IV, Pt with slower deliberate movements with RUE and mild ataxia RUE Body System: Neuro Brunstrum levels for arm and hand: Arm;Hand Brunstrum level for arm: Stage V Relative Independence from Synergy Brunstrum level for hand: Stage VI Isolated joint movements    Care Tool Care Tool Self Care Eating   Eating Assist Level: Set up assist    Oral Care    Oral Care Assist Level: Set up assist    Bathing   Body parts bathed by patient: Right arm;Left arm;Chest;Abdomen;Front perineal area;Right upper leg;Left upper  leg;Face Body parts bathed by helper: Buttocks;Right lower leg;Left lower leg   Assist Level: Moderate Assistance - Patient 50 - 74%    Upper Body Dressing(including orthotics)   What is the patient wearing?: Pull over shirt   Assist Level: Supervision/Verbal cueing    Lower Body Dressing (excluding footwear)   What is the patient wearing?: Pants;Underwear/pull up Assist for lower body dressing: Maximal Assistance - Patient 25 - 49%    Putting on/Taking off footwear   What is the patient wearing?: Non-skid slipper socks Assist for footwear: Moderate Assistance - Patient 50 - 74%       Care Tool Toileting Toileting activity   Assist for toileting: Maximal Assistance - Patient 25 - 49%     Care Tool Bed Mobility Roll left and right activity   Roll left and right assist level: Contact Guard/Touching assist Roll left and right assistive device comment: Side rails  Sit to lying activity   Sit to lying assist level: Minimal Assistance -  Patient > 75%    Lying to sitting edge of bed activity   Lying to sitting edge of bed assist level: Minimal Assistance - Patient > 75%     Care Tool Transfers Sit to stand transfer   Sit to stand assist level: Minimal Assistance - Patient > 75%    Chair/bed transfer   Chair/bed transfer assist level: Moderate Assistance - Patient 50 - 74%     Toilet transfer   Assist Level: Moderate Assistance - Patient 50 - 74%     Care Tool Cognition Expression of Ideas and Wants     Understanding Verbal and Non-Verbal Content     Memory/Recall Ability *first 3 days only      Refer to Care Plan for Long Term Goals  SHORT TERM GOAL WEEK 1 OT Short Term Goal 1 (Week 1): Pt will transfer with CGA consistently to get to toilet with LRAD OT Short Term Goal 2 (Week 1): Pt will don LB clothing wiht A only for standing balance OT Short Term Goal 3 (Week 1): Pt will compelte 2/3 components of toileting wiht MIN A for balance OT Short Term Goal 4 (Week 1): Pt will stand at sink to groom with MIN A to demo improved standing tolerance  Recommendations for other services: Neuropsych and Therapeutic Recreation  Pet therapy, Stress management and Outing/community reintegration   Skilled Therapeutic Intervention Pt received in bed with "Best friend Verdene Lennert" present. Edu re OT role/purpose, CIR, ELOS and POC. Pt hesitant to shower and agreeable to trial on next session. All mobility with RW and MIN A with poor management of RW and hand placement requiring education and VC. Pt completes ADL as listed below in increased time to rest breaks but no dizziness. Pt seems highly motivated to return to PLOF and will benefit from aggressive skilled OT to reach supervision level DC.   ADL ADL Grooming: Supervision/safety Where Assessed-Grooming: Edge of bed Upper Body Bathing: Supervision/safety Where Assessed-Upper Body Bathing: Edge of bed Lower Body Bathing: Moderate assistance (BSC) Where Assessed-Lower Body  Bathing: Other (Comment) Upper Body Dressing: Supervision/safety Where Assessed-Upper Body Dressing: Edge of bed Lower Body Dressing: Maximal assistance Where Assessed-Lower Body Dressing: Other (Comment) (BSC) Toilet Transfer: Minimal assistance Toilet Transfer Method: Stand pivot Toilet Transfer Equipment: Bedside commode (RW) Mobility  Bed Mobility Bed Mobility: Rolling Left;Rolling Right;Sit to Supine;Supine to Sit;Sitting - Scoot to Edge of Bed Rolling Right: Contact Guard/Touching assist Rolling Left: Contact Guard/Touching assist Supine to Sit: Minimal Assistance -  Patient > 75% Sitting - Scoot to Edge of Bed: Minimal Assistance - Patient > 75% Sit to Supine: Minimal Assistance - Patient > 75% Transfers Sit to Stand: Minimal Assistance - Patient > 75%   Discharge Criteria: Patient will be discharged from OT if patient refuses treatment 3 consecutive times without medical reason, if treatment goals not met, if there is a change in medical status, if patient makes no progress towards goals or if patient is discharged from hospital.  The above assessment, treatment plan, treatment alternatives and goals were discussed and mutually agreed upon: by patient  Tonny Branch 09/02/2020, 1:42 PM

## 2020-09-02 NOTE — Progress Notes (Signed)
[facility] PHYSICAL MEDICINE & REHABILITATION PROGRESS NOTE   Subjective/Complaints:  Pt reports slept on and off- LBM was yesterday- feels like could go again today.   Having HA on and off- right now, it's been bad since coughed- 5-6/10 right now- waiting for Norco.  Also has associated tightness in R side of neck.  Pain down RUE.    ROS:  Pt denies SOB, abd pain, CP, N/V/C/D, and vision changes  Objective:   No results found. Recent Labs    09/01/20 0206 09/02/20 0425  WBC 8.8 10.3  HGB 9.5* 10.6*  HCT 29.5* 32.5*  PLT 276 341   Recent Labs    09/01/20 0206 09/02/20 0425  NA 139 138  K 3.7 4.0  CL 103 99  CO2 27 29  GLUCOSE 134* 130*  BUN 10 12  CREATININE 0.73 0.73  CALCIUM 8.9 9.8    Intake/Output Summary (Last 24 hours) at 09/02/2020 1035 Last data filed at 09/02/2020 0858 Gross per 24 hour  Intake 360 ml  Output --  Net 360 ml        Physical Exam: Vital Signs Blood pressure 140/61, pulse 70, temperature 97.9 F (36.6 C), resp. rate 16, height 5' (1.524 m), weight 75.3 kg, SpO2 97 %.  Constitutional:  pt awake, talking appropriate, Cortrak in place, SLP in room, NAD HENT: R craniotomy site with staples intact- looks good.  Cardiovascular: RRR Pulmonary: CTA B/L- no W/R/R- good air movement- didn't hear any coughing Abdominal: Soft, NT, ND, (+)BS   Musculoskeletal:  Very tight in R levators and scalenes- could be contributing to HA    Cervical back: Normal range of motion. No rigidity.     Comments: RUE 5-/5 in biceps, triceps, WE< grip and finger abd LUE- 4+/5 in same muscles LEs- 5-5/ B/L in HF, KE, DF and PF  Skin:    Comments: Craniotomy site clean and dry with staples intact Lips really dry/cracked; no skin breakdown on buttocks or heels; 6 blisters only 1 still has fluid on L wrist/hand- other 5 are flat/popped/healing  Neurological:     Comments: Patient is alert sitting up in chair.  Mild left facial droop.  Makes eye contact with  examiner.  She provides her name and age.  She knew the next holiday was Christmas.  Follows simple commands. Intact to light touch in all 4 extremities Slightly slurred speech/dysarthric, but mentation doing well- slightly impulsive.   Psychiatric: appropriate    Assessment/Plan: 1. Functional deficits which require 3+ hours per day of interdisciplinary therapy in a comprehensive inpatient rehab setting.  Physiatrist is providing close team supervision and 24 hour management of active medical problems listed below.  Physiatrist and rehab team continue to assess barriers to discharge/monitor patient progress toward functional and medical goals  Care Tool:  Bathing              Bathing assist       Upper Body Dressing/Undressing Upper body dressing        Upper body assist      Lower Body Dressing/Undressing Lower body dressing            Lower body assist       Toileting Toileting    Toileting assist Assist for toileting: Maximal Assistance - Patient 25 - 49%     Transfers Chair/bed transfer  Transfers assist     Chair/bed transfer assist level: Moderate Assistance - Patient 50 - 74%     Locomotion Ambulation  Ambulation assist      Assist level: Moderate Assistance - Patient 50 - 74% Assistive device: Walker-rolling     Walk 10 feet activity   Assist     Assist level: Moderate Assistance - Patient - 50 - 74% Assistive device: Walker-rolling   Walk 50 feet activity   Assist           Walk 150 feet activity   Assist           Walk 10 feet on uneven surface  activity   Assist Walk 10 feet on uneven surfaces activity did not occur: Safety/medical concerns         Wheelchair     Assist Will patient use wheelchair at discharge?: Yes Type of Wheelchair: Manual           Wheelchair 50 feet with 2 turns activity    Assist            Wheelchair 150 feet activity     Assist           Blood pressure 140/61, pulse 70, temperature 97.9 F (36.6 C), resp. rate 16, height 5' (1.524 m), weight 75.3 kg, SpO2 97 %.     Medical Problem List and Plan: 1.  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  Status post right frontal parietal craniotomy evacuation of subdural hematoma 08/23/2020             -patient may  Shower if can cover craniotomy site with cap             -ELOS/Goals: 10-14 days- mod I to supervision 2.  Antithrombotics: -DVT/anticoagulation: SCDs             -antiplatelet therapy: N/A 3. Pain Management/fibromyalgia: Lyrica 100 mg twice daily, Elavil 50 mg nightly, hydrocodone as needed  12/24- added topamax for headaches- could benefit from Trigger point injections in R levators/scalenes.  Will try Lidocaine patch at night on R side of neck.  4. Mood: Provide emotional support             -antipsychotic agents: N/A 5. Neuropsych: This patient is capable of making decisions on her own behalf. 6. Skin/Wound Care: Routine skin checks- daily dressing changes on L hand/wrist 7. Fluids/Electrolytes/Nutrition: Routine in and outs with follow-up chemistries 8.  Seizure prophylaxis.  EEG negative.  Keppra 1000 mg twice daily 9.  Hypertension.  Norvasc 10 mg daily, Lopressor 25 mg twice daily.  Monitor with increased mobility  12/24- BP 140/65- con't regimen 10.  Hyperlipidemia.  Lipitor 11.  History of PAD.  Patient on aspirin Plavix prior to admission.  Currently on hold due to SDH 12.  Tobacco abuse.  Counseling 13.  Morbid obesity.  BMI 32.68.  Dietary follow-up 14.  Urinary retention.  Urecholine 10 mg 3 times daily.  Check PVR 15.  Dysphagia.  Mechanical soft thin liquids.  Nasogastric tube had been in place for nutritional support with calorie counts and hope to remove soon- getting calorie count currently.  16. Daily headaches- since crani- adding Topamax 50 mg QHS for prevention-   12/24- will try lidocaine patch for R side  of neck at night. Also added Robaxin 750 mg q6 hours prn for muscle tightness 17. Insomnia- took ELvail at home for this- will continue- daughter wants "mother to not HAVE to take any medicine"- will con't home dose.      LOS: 1 days A FACE TO FACE EVALUATION WAS PERFORMED  Danyeal Akens 09/02/2020, 10:35 AM

## 2020-09-02 NOTE — Progress Notes (Signed)
Inpatient Rehabilitation Care Coordinator Assessment and Plan Patient Details  Name: Amy Bruce MRN: [id number] Date of Birth: 1950-12-24  Today's Date: 09/02/2020  Hospital Problems: Principal Problem:   Traumatic subdural hematoma [facility])  Past Medical History:  Past Medical History:  Diagnosis Date  . Anemia    in the past  . Anxiety    yrs. ago panic attacks  . Arthritis   . Cancer (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 Complication of anesthesia    pt. reported her father became deaf after having anesthesia  . Fibromyalgia   . GERD (gastroesophageal reflux disease)   . Heart murmur    due to rheumatic fever, PCP no longer hears it  . Hyperlipidemia   . Hypertension   . Myalgia   . Neuropathy   . PAD (peripheral artery disease) ([place])   . PAD (peripheral artery disease) ([place])   . Pre-diabetes   . Pyelonephritis 09/10/2019    "septic"  . Tobacco abuse   . 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 ABDOMINAL AORTOGRAM W/LOWER EXTREMITY N/A 04/02/2018   Procedure: ABDOMINAL AORTOGRAM W/LOWER EXTREMITY;  Surgeon: Wellington Hampshire, MD;  Location: [place] [facility];  Service: Cardiovascular;  Laterality: N/A;  . ABDOMINAL AORTOGRAM W/LOWER EXTREMITY N/A 09/23/2019   Procedure: ABDOMINAL AORTOGRAM W/LOWER EXTREMITY;  Surgeon: Wellington Hampshire, MD;  Location: [place] [facility];  Service: Cardiovascular;  Laterality: N/A;  . ABDOMINAL HYSTERECTOMY  1999  . CERVICAL SPINE SURGERY  [id number]   C5/6 6/7  . COLONOSCOPY    . CRANIOTOMY Right 08/23/2020   Procedure: RIGHT CRANIOTOMY HEMATOMA EVACUATION SUBDURAL;  Surgeon: Consuella Lose, MD;  Location: [place];  Service: Neurosurgery;  Laterality: Right;  . ECTOPIC PREGNANCY SURGERY    . Left axillary cyst removal 1989    . MULTIPLE EXTRACTIONS WITH ALVEOLOPLASTY N/A 10/25/2017   Procedure: EXTRACTION NUMBERS FIVE, SEVEN,  EIGHT, NINE, TEN, FIFTEEN, EIGHTEEN WITH ALVEOLOPLASTY, IRRIGATION AND DEBRIDEMENT LEFT SUBMANDIBULAR INFECTION;  Surgeon: Diona Browner, DDS;  Location: [place];  Service: Oral Surgery;  Laterality: N/A;  . PERIPHERAL VASCULAR ATHERECTOMY Right 09/23/2019   Procedure: PERIPHERAL VASCULAR ATHERECTOMY;  Surgeon: Wellington Hampshire, MD;  Location: [place] [facility];  Service: Cardiovascular;  Laterality: Right;  SFA  . PERIPHERAL VASCULAR BALLOON ANGIOPLASTY Right 09/23/2019   Procedure: PERIPHERAL VASCULAR BALLOON ANGIOPLASTY;  Surgeon: Wellington Hampshire, MD;  Location: [place] [facility];  Service: Cardiovascular;  Laterality: Right;  SFA  . RADIOLOGY WITH ANESTHESIA N/A 11/18/2018   Procedure: MRI OF THE CERVICAL SPINE WITHOUT CONTRAST;  Surgeon: Radiologist, Medication, MD;  Location: [place];  Service: Radiology;  Laterality: N/A;  . RADIOLOGY WITH ANESTHESIA N/A 12/03/2019   Procedure: MRI WITH ANESTHESIA CERVICAL WITH AND WITHOUT CONTRAST;  Surgeon: Radiologist, Medication, MD;  Location: [place];  Service: Radiology;  Laterality: N/A;  . TONSILECTOMY, ADENOIDECTOMY, BILATERAL MYRINGOTOMY AND TUBES  1981   put not aware of tubes being put in ears  . TUBAL LIGATION  1981   Social History:  reports that she has been smoking cigarettes. She has a 33.00 pack-year smoking history. She has never used smokeless tobacco. She reports current alcohol use of about 2.0 standard drinks of alcohol per week. She reports that she does not use drugs.  Family / Support Systems Marital Status: Divorced How Long?: since 2003. Patient Roles: Parent  Spouse/Significant Other: Divorced Children: 5 adult children. 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 Legal History/Current Legal Issues: Denies Guardian/Conservator: N/A   Abuse/Neglect Abuse/Neglect Assessment Can Be Completed: Yes Physical Abuse: Denies Verbal Abuse: Denies Sexual Abuse: Denies Exploitation of patient/patient's resources: Denies Self-Neglect: Denies  Emotional Status Pt's affect, behavior and adjustment status: Pt in good spirits at time of visit Recent Psychosocial Issues: hx of anxiety/panic attacks Psychiatric History: Pt admits to anxiety/panic attacks. Takes medication Substance Abuse History: Denies  Patient / Family Perceptions, Expectations & Goals Pt/Family understanding of illness & functional limitations: Pt and family have a good understanding of care needs Premorbid pt/family roles/activities: Independent Anticipated changes in roles/activities/participation: Assistance with ADLs/IADLs Pt/family expectations/goals: Pt goal is go get back as independent as possible; become more confident about walking  [employer]: None Premorbid Home Care/DME Agencies: None Transportation available at discharge: family Resource referrals recommended: Neuropsychology  Discharge Planning Living Arrangements: Children Support Systems: Children Type of Residence: Private residence Insurance Resources: Multimedia programmer (specify),Medicaid (specify county) (UHC Medicare) Museum/gallery curator Resources: Radio broadcast assistant Screen Referred: No Living Expenses: Rent Money Management: Patient Does the patient have any problems obtaining your medications?: No Care Coordinator Barriers to Discharge: Decreased caregiver support,Lack of/limited family support Care  Coordinator Barriers to Discharge Comments: Limited support locaaly Care Coordinator Anticipated Follow Up Needs: HH/OP  Clinical Impression SW met with pt in room to introduce self, explain role,and discuss discharge process. Pt admits that depending on her care needs will determine which daughter's home she goes to: [facility] in [place] if intermittent support needed or [facility] in [place], [place]. Pt does understand that she will need to check with insurance to change plan to traditional Medicare as current plan [facility] Medicare Dual complete is likely to be accepted in either state; and she will need to reapply for Medicaid pending if the state participates. Pt is not No HCPOA. Pt would like to complete while here. 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 Pt has no DME. Pt lives in senior housing community. Pt states she uses transportation services through her insurance for medical appointments and medication pick up. States she has groceries delivered to her home. Pt aware SW to make contact with her dtr [facility] [phone]) as she is the primary contact to discuss discharge.   1:13pm- SW spoke with pt dtr [name] to introduce self, explain role, and discuss discharge process. SW and dtr discussed d/c plan is pending mother's care needs and insurance. SW encouraged he to follow-up with Medicaid services in area.  Will speak with pt mother to discuss if move is permanent. She is aware SW will follow-up after team conference with updates.   SW informed pt assigned RN on [facility] request with chaplain services.   Rana Snare 09/02/2020, 8:34 PM

## 2020-09-02 NOTE — Evaluation (Signed)
Physical Therapy Assessment and Plan  Patient Details  Name: Amy Bruce MRN: [id number] Date of Birth: 10-28-1950  PT Diagnosis: Abnormal posture, Abnormality of gait, Difficulty walking, Dizziness and giddiness, Impaired cognition, Impaired sensation, Muscle weakness and Pain in headache Rehab Potential: Good ELOS: 2 weeks   Today's Date: 09/02/2020 PT Individual Time:  -       Hospital Problem: Principal Problem:   Traumatic subdural hematoma [facility])   Past Medical History:  Past Medical History:  Diagnosis Date  . Anemia    in the past  . Anxiety    yrs. ago panic attacks  . Arthritis   . 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 Complication of anesthesia    pt. reported her father became deaf after having anesthesia  . Fibromyalgia   . GERD (gastroesophageal reflux disease)   . Heart murmur    due to rheumatic fever, PCP no longer hears it  . Hyperlipidemia   . Hypertension   . Myalgia   . Neuropathy   . PAD (peripheral artery disease) ([place])   . PAD (peripheral artery disease) ([place])   . Pre-diabetes   . Pyelonephritis 09/10/2019    "septic"  . Tobacco abuse   . 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 ABDOMINAL AORTOGRAM W/LOWER EXTREMITY N/A 04/02/2018   Procedure: ABDOMINAL AORTOGRAM W/LOWER EXTREMITY;  Surgeon: Wellington Hampshire, MD;  Location: [place] [facility];  Service: Cardiovascular;  Laterality: N/A;  . ABDOMINAL AORTOGRAM W/LOWER EXTREMITY N/A 09/23/2019   Procedure: ABDOMINAL AORTOGRAM W/LOWER EXTREMITY;  Surgeon: Wellington Hampshire, MD;  Location: [place] [facility];  Service: Cardiovascular;  Laterality: N/A;  . ABDOMINAL HYSTERECTOMY  1999  . CERVICAL SPINE SURGERY  2248,2500   C5/6 6/7  . COLONOSCOPY    . CRANIOTOMY Right 08/23/2020   Procedure: RIGHT CRANIOTOMY HEMATOMA EVACUATION SUBDURAL;  Surgeon: Consuella Lose, MD;  Location:  [place];  Service: Neurosurgery;  Laterality: Right;  . ECTOPIC PREGNANCY SURGERY    . Left axillary cyst removal 1989    . MULTIPLE EXTRACTIONS WITH ALVEOLOPLASTY N/A 10/25/2017   Procedure: EXTRACTION NUMBERS FIVE, SEVEN, EIGHT, NINE, TEN, FIFTEEN, EIGHTEEN WITH ALVEOLOPLASTY, IRRIGATION AND DEBRIDEMENT LEFT SUBMANDIBULAR INFECTION;  Surgeon: Diona Browner, DDS;  Location: [place];  Service: Oral Surgery;  Laterality: N/A;  . PERIPHERAL VASCULAR ATHERECTOMY Right 09/23/2019   Procedure: PERIPHERAL VASCULAR ATHERECTOMY;  Surgeon: Wellington Hampshire, MD;  Location: [place] [facility];  Service: Cardiovascular;  Laterality: Right;  SFA  . PERIPHERAL VASCULAR BALLOON ANGIOPLASTY Right 09/23/2019   Procedure: PERIPHERAL VASCULAR BALLOON ANGIOPLASTY;  Surgeon: Wellington Hampshire, MD;  Location: [place] [facility];  Service: Cardiovascular;  Laterality: Right;  SFA  . RADIOLOGY WITH ANESTHESIA N/A 11/18/2018   Procedure: MRI OF THE CERVICAL SPINE WITHOUT CONTRAST;  Surgeon: Radiologist, Medication, MD;  Location: [place];  Service: Radiology;  Laterality: N/A;  . RADIOLOGY WITH ANESTHESIA N/A 12/03/2019   Procedure: MRI WITH ANESTHESIA CERVICAL WITH AND WITHOUT CONTRAST;  Surgeon: Radiologist, Medication, MD;  Location: [place];  Service: Radiology;  Laterality: N/A;  . TONSILECTOMY, ADENOIDECTOMY, BILATERAL MYRINGOTOMY AND TUBES  1981   put not aware of tubes being put in ears  . TUBAL LIGATION  1981    Assessment & Plan Clinical Impression: Patient is a 69 y.o. year old female with history of anxiety, cervical fusion  04/2020, obesity with BMI 32.68, prediabetes, fibromyalgia, hyperlipidemia, hypertension, PAD maintained on aspirin and Plavix as well as history of tobacco use.  Per chart review patient lives alone in [place].  Reportedly independent prior to admission.  She plans to stay with her daughter in [place] on discharge.  Presented 08/23/2020 after reported fall outside of her doctor's office when she  lost her balance.  No reported loss of consciousness.  Cranial CT scan showed acute right cerebral convexity greater than falcine subdural hematoma.  12 mm leftward midline shift.  No definite ventricle trapping.    CT cervical spine negative for acute fracture or subluxation.  CT abdomen pelvis no evidence of acute traumatic injury.  Admission chemistries urinalysis negative nitrite urine screen negative, chemistries unremarkable aside calcium 8.6 WBC 10,800.  Follow-up neurosurgery for SDH underwent right frontal parietal craniotomy for evacuation of subdural hematoma 08/23/2020 per Dr. Kathyrn Sheriff.  EEG negative for seizure.  Maintained on Keppra for seizure prophylaxis.  Currently on a mechanical soft thin liquid diet as well as a nasogastric tube had been in place for nutritional support.  Bouts of urinary retention placed on low-dose Urecholine.  Due to patient's decrease in functional ability altered mental status she was admitted for an inpatient rehab comprehensive therapy course.  Patient transferred to CIR on 09/01/2020 .   Patient currently requires mod with mobility secondary to muscle weakness, decreased cardiorespiratoy endurance, impaired timing and sequencing and decreased motor planning and decreased sitting balance, decreased standing balance, decreased postural control and decreased balance strategies.  Prior to hospitalization, patient was independent  with mobility and lived with Alone in a House home.  Home access is  Level entry,Elevator.  Patient will benefit from skilled PT intervention to maximize safe functional mobility, minimize fall risk and decrease caregiver burden for planned discharge home with intermittent assist.  Anticipate patient will benefit from follow up [facility] at discharge.  PT - End of Session Activity Tolerance: Tolerates 30+ min activity without fatigue Endurance Deficit: Yes PT Assessment Rehab Potential (ACUTE/IP ONLY): Good PT Barriers to Discharge: Decreased  caregiver support;Home environment access/layout;Behavior;Lack of/limited family support;Incontinence PT Patient demonstrates impairments in the following area(s): Balance;Pain;Behavior;Perception;Safety;Endurance;Sensory;Skin Integrity PT Transfers Functional Problem(s): Bed Mobility;Bed to Chair;Car PT Locomotion Functional Problem(s): Ambulation;Wheelchair Mobility PT Plan PT Intensity: Minimum of 1-2 x/day ,45 to 90 minutes PT Frequency: 5 out of 7 days PT Duration Estimated Length of Stay: 2 weeks PT Treatment/Interventions: Ambulation/gait training;Discharge planning;Functional mobility training;Cognitive remediation/compensation;DME/adaptive equipment instruction;UE/LE Strength taining/ROM;Therapeutic Activities;Splinting/orthotics;Pain management;Visual/perceptual remediation/compensation;Psychosocial support;Balance/vestibular training;Community reintegration;Disease management/prevention;Neuromuscular re-education;Patient/family education;Stair training;Skin care/wound management;Therapeutic Exercise;UE/LE Coordination activities;Wheelchair propulsion/positioning PT Transfers Anticipated Outcome(s): supervision PT Locomotion Anticipated Outcome(s): supervision wiht LRAD PT Recommendation Recommendations for Other Services: Neuropsych consult Follow Up Recommendations: Home health PT Patient destination: Home (with family) Equipment Recommended: To be determined   PT Evaluation Precautions/Restrictions Precautions Precautions: Fall Restrictions Weight Bearing Restrictions: No General   Vital Signs  Pain Pain Assessment Pain Scale: 0-10 Pain Score: 7  Pain Type: Acute pain Pain Location: Head Pain Orientation: Posterior Pain Descriptors / Indicators: Headache Pain Onset: On-going Patients Stated Pain Goal: 2 Pain Intervention(s): Medication (See eMAR);Rest (pt reports she has just had pain meds and declined further nursing needs) Multiple Pain Sites: No Home Living/Prior  [place] expects to be discharged to:: Private residence Living Arrangements: Alone Available Help at Discharge: Family;Available 24 hours/day (pt has two daughters invloved in care however reports one is in [place] and one in [place]. pt plans to DC to one of  them, if she does not need 24/7 supervision, pt reports she will stay in [place] with DTR) Type of Home: House Home Access: Level entry;Elevator Home Layout: One level Additional Comments: verified with family however pt reports one step to enter both houses, no railing  Lives With: Alone Prior Function Level of Independence: Independent with basic ADLs;Independent with homemaking with ambulation;Independent with gait;Independent with transfers  Able to Take Stairs?: No Driving: Yes Vocation: Retired Comments: Independent without AD Vision/Perception  Perception Perception: Within Functional Limits Praxis Praxis: Intact  Cognition Overall Cognitive Status: Impaired/Different from baseline Arousal/Alertness: Awake/alert Orientation Level: Oriented X4 Attention: Focused;Sustained Focused Attention: Appears intact Sustained Attention: Impaired Sustained Attention Impairment: Verbal basic;Functional basic Awareness: Impaired Awareness Impairment: Intellectual impairment;Emergent impairment Problem Solving: Impaired Problem Solving Impairment: Verbal basic;Functional basic Safety/Judgment: Impaired Sensation Sensation Light Touch: Impaired by gross assessment (pt reports having baseline numbness and tingling in B UE and hands) Coordination Gross Motor Movements are Fluid and Coordinated: Yes Fine Motor Movements are Fluid and Coordinated: Yes Motor  Motor Motor: Within Functional Limits   Trunk/Postural Assessment  Cervical Assessment Cervical Assessment: Exceptions to [facility] (forward head carriage) Thoracic Assessment Thoracic Assessment: Exceptions to [facility] (mild kyphosis) Lumbar Assessment Lumbar  Assessment: Exceptions to [facility] (posterior peliv tilt) Postural Control Postural Control: Deficits on evaluation  Balance Balance Balance Assessed: Yes Static Sitting Balance Static Sitting - Balance Support: Feet supported Static Sitting - Level of Assistance: 5: Stand by assistance Dynamic Sitting Balance Dynamic Sitting - Balance Support: Feet supported;During functional activity Dynamic Sitting - Level of Assistance: 4: Min assist Dynamic Sitting - Balance Activities: Reaching for objects;Forward lean/weight shifting;Lateral lean/weight shifting Static Standing Balance Static Standing - Balance Support: Bilateral upper extremity supported Static Standing - Level of Assistance: 4: Min assist Dynamic Standing Balance Dynamic Standing - Balance Support: Bilateral upper extremity supported Dynamic Standing - Level of Assistance: 4: Min assist Extremity Assessment      RLE Assessment RLE Assessment: Exceptions to [facility] RLE Strength Right Hip Flexion: 3+/5 Right Hip ABduction: 3+/5 Right Hip ADduction: 3+/5 Right Knee Flexion: 3+/5 Right Knee Extension: 4-/5 Right Ankle Dorsiflexion: 4-/5 Right Ankle Plantar Flexion: 4-/5 LLE Assessment LLE Assessment: Exceptions to [facility] LLE Strength Left Hip Flexion: 3+/5 Left Hip ABduction: 3+/5 Left Hip ADduction: 3+/5 Left Knee Flexion: 4-/5 Left Knee Extension: 4-/5 Left Ankle Dorsiflexion: 4-/5 Left Ankle Plantar Flexion: 4-/5  Care Tool Care Tool Bed Mobility Roll left and right activity   Roll left and right assist level: Contact Guard/Touching assist Roll left and right assistive device comment: Side rails  Sit to lying activity   Sit to lying assist level: Minimal Assistance - Patient > 75%    Lying to sitting edge of bed activity   Lying to sitting edge of bed assist level: Minimal Assistance - Patient > 75%     Care Tool Transfers Sit to stand transfer   Sit to stand assist level: Minimal Assistance - Patient > 75%     Chair/bed transfer   Chair/bed transfer assist level: Moderate Assistance - Patient 50 - 74%     Toilet transfer   Assist Level: Moderate Assistance - Patient 50 - 74%    Car transfer   Car transfer assist level: Minimal Assistance - Patient > 75%      Care Tool Locomotion Ambulation   Assist level: Minimal Assistance - Patient > 75% Assistive device: Walker-rolling Max distance: 18'  Walk 10 feet activity   Assist level: Minimal Assistance - Patient > 75%  Assistive device: Walker-rolling   Walk 50 feet with 2 turns activity Walk 50 feet with 2 turns activity did not occur: Safety/medical concerns      Walk 150 feet activity Walk 150 feet activity did not occur: Safety/medical concerns      Walk 10 feet on uneven surfaces activity Walk 10 feet on uneven surfaces activity did not occur: Safety/medical concerns      Stairs Stair activity did not occur: Safety/medical concerns        Walk up/down 1 step activity Walk up/down 1 step or curb (drop down) activity did not occur: Safety/medical concerns     Walk up/down 4 steps activity did not occuR: Safety/medical concerns  Walk up/down 4 steps activity      Walk up/down 12 steps activity Walk up/down 12 steps activity did not occur: Safety/medical concerns      Pick up small objects from floor Pick up small object from the floor (from standing position) activity did not occur: Safety/medical concerns      Wheelchair Will patient use wheelchair at discharge?: Yes Type of Wheelchair: Manual   Wheelchair assist level: Maximal Assistance - Patient 25 - 49% Max wheelchair distance: 150  Wheel 50 feet with 2 turns activity   Assist Level: Moderate Assistance - Patient 50 - 74%  Wheel 150 feet activity   Assist Level: Maximal Assistance - Patient 25 - 49%    Refer to Care Plan for Long Term Goals  SHORT TERM GOAL WEEK 1 PT Short Term Goal 1 (Week 1): pt to demonstrate supine<>sit CGA with limited use of bedrails PT  Short Term Goal 2 (Week 1): pt to demonstrate functional transfers with LRAD at min A consistently PT Short Term Goal 3 (Week 1): pt to demonstrate gait training with LRAD at 100' min A  Recommendations for other services: Neuropsych  Skilled Therapeutic Intervention  Evaluation completed (see details above and below) with education on PT POC and goals and individual treatment initiated with focus on  Bed mobility, transfer training, gait training, safety awareness, call light use, sitting and standing balance, car transfer. pt received in bed and agreeable to therapy. Pt reported 7/10 pain at headache at start of session but reported she had been medicated and denied additional nursing care. Pt directed in supine>sit min A with use of bedrail, sat EOB CGA for static sitting and min A for dynamic sitting for safety. Pt reported she needed to use the urinate and attempted to quickly stand to Rolling walker and walk to restroom however required mod A for safety with noted LOB and trunk sway, PT directed pt to return to sitting EOB as she was unsteady and unsafe to quickly enter restroom at that time, pt agreed and sat EOB, PT placed BSC near pt and directed her in Stand pivot transfer with Rolling walker min A, total A to doff brief. Pt (+) void of bladder and BM at [facility], total A for hygiene. Pt then directed in Stand pivot transfer to [facility] and directed in WC mobility 50' min A for straight path and mod A for turns with increased need of assist for hand placement for turning and max A for additional 100'. Pt distractible in open environment and benefited from [facility] for attention to task. Pt then directed in car transfer with Rolling walker min A and single step cues. Pt then directed in gait training with Rolling walker for 18' min A with VC for trunk extension, increased stride length, and walker proximity  and increased step height for clearance. Pt requested to return back to bed at end of session, directed in  Stand pivot transfer to bedside with Rolling walker min A and CGA for sit>supine with bedrail use. pt left in bed, alarm set, All needs in reach and in good condition. Call light in hand.  Nursing updated on pt's transfers and mobility level.    Mobility Bed Mobility Bed Mobility: Rolling Left;Rolling Right;Sit to Supine;Supine to Sit;Sitting - Scoot to [employer] of Bed Rolling Right: Contact Guard/Touching assist Rolling Left: Contact Guard/Touching assist Supine to Sit: Minimal Assistance - Patient > 75% Sitting - Scoot to Edge of Bed: Minimal Assistance - Patient > 75% Sit to Supine: Minimal Assistance - Patient > 75% Transfers Transfers: Sit to [employer] Transfers Sit to Stand: Minimal Assistance - Patient > 75% Stand Pivot Transfers: Moderate Assistance - Patient 50 - 74% Stand Pivot Transfer Details: Verbal cues for sequencing;Verbal cues for technique;Verbal cues for precautions/safety;Verbal cues for safe use of DME/AE;Visual cues/gestures for sequencing Transfer (Assistive device): Rolling walker Locomotion  Gait Ambulation: Yes Gait Assistance: Minimal Assistance - Patient > 75% Gait Distance (Feet): 18 Feet Assistive device: Rolling walker Gait Assistance Details: Verbal cues for precautions/safety;Verbal cues for safe use of DME/AE;Verbal cues for technique;Verbal cues for gait pattern Gait Gait: Yes Gait Pattern: Impaired Gait Pattern: Step-through pattern;Decreased step length - right;Decreased step length - left;Narrow base of support;Trunk flexed Gait velocity: decreased Stairs / Additional Locomotion Stairs: No Wheelchair Mobility Wheelchair Mobility: Yes Wheelchair Assistance: Maximal Assistance - Patient 25 - 49% Wheelchair Propulsion: Both upper extremities Distance: 150   Discharge Criteria: Patient will be discharged from PT if patient refuses treatment 3 consecutive times without medical reason, if treatment goals not met, if there is a change in medical  status, if patient makes no progress towards goals or if patient is discharged from hospital.  The above assessment, treatment plan, treatment alternatives and goals were discussed and mutually agreed upon: by patient  Junie Panning 09/02/2020, 12:29 PM

## 2020-09-02 NOTE — IPOC Note (Signed)
Overall Plan of Care [facility]) Patient Details Name: Amy Bruce MRN: [id number] DOB: 05/18/1951  Admitting Diagnosis: Traumatic subdural hematoma [facility])  Hospital Problems: Principal Problem:   Traumatic subdural hematoma [facility])     Functional Problem List: Nursing Bladder,Bowel,Endurance,Medication Management,Safety,Pain,Skin Integrity  PT Balance,Pain,Behavior,Perception,Safety,Endurance,Sensory,Skin Integrity  OT Balance,Cognition,Edema,Endurance,Motor,Nutrition,Pain,Perception,Safety,Sensory,Skin Integrity  SLP Cognition  TR         Basic ADL's: OT Grooming,Bathing,Dressing,Toileting     Advanced  ADL's: OT       Transfers: PT Bed Mobility,Bed to Chair,Car  OT Toilet,Tub/Shower     Locomotion: PT Ambulation,Wheelchair Mobility     Additional Impairments: OT Fuctional Use of Upper Extremity  SLP Swallowing,Social Cognition   Awareness,Attention,Memory,Problem Solving  TR      Anticipated Outcomes Item Anticipated Outcome  Self Feeding S  Swallowing  Mod I   Basic self-care  S  Toileting  S   Bathroom Transfers S  Bowel/Bladder  Patient will manage with Mod I assistance  Transfers  supervision  Locomotion  supervision wiht LRAD  Communication     Cognition  Supervision A  Pain  Pain score will be )  Safety/Judgment  Patient will have no unassisted falls while on the rehab unit.   Therapy Plan: PT Intensity: Minimum of 1-2 x/day ,45 to 90 minutes PT Frequency: 5 out of 7 days PT Duration Estimated Length of Stay: 2 weeks OT Intensity: Minimum of 1-2 x/day, 45 to 90 minutes OT Frequency: 5 out of 7 days OT Duration/Estimated Length of Stay: 1.5-2 weeks SLP Intensity: Minumum of 1-2 x/day, 30 to 90 minutes SLP Frequency: 3 to 5 out of 7 days SLP Duration/Estimated Length of Stay: 10-14 days   Due to the current state of emergency, patients may not be receiving their 3-hours of Medicare-mandated therapy.   Team Interventions: Nursing  Interventions Patient/Family Education,Bladder Management,Bowel Management,Disease Management/Prevention,Pain Management,Medication Management,Psychosocial Support,Dysphagia/Aspiration Precaution Training,Skin Care/Wound Management  PT interventions Ambulation/gait training,Discharge planning,Functional mobility training,Cognitive remediation/compensation,DME/adaptive equipment instruction,UE/LE Strength taining/ROM,Therapeutic Activities,Splinting/orthotics,Pain management,Visual/perceptual remediation/compensation,Psychosocial support,Balance/vestibular training,Community reintegration,Disease management/prevention,Neuromuscular re-education,Patient/family education,Stair training,Skin care/wound management,Therapeutic Exercise,UE/LE Museum/gallery conservator propulsion/positioning  OT Interventions Balance/vestibular training,Discharge planning,Pain management,Self Care/advanced ADL retraining,UE/LE Coordination activities,Therapeutic Activities,Visual/perceptual remediation/compensation,Therapeutic Exercise,Skin care/wound managment,Patient/family education,Functional mobility training,Disease mangement/prevention,Cognitive remediation/compensation,Community Corporate treasurer re-education,Psychosocial support,Splinting/orthotics,UE/LE Strength taining/ROM,Wheelchair propulsion/positioning  SLP Interventions Cognitive remediation/compensation,Cueing hierarchy,Dysphagia/aspiration precaution training,Functional tasks,Medication managment,Patient/family education,Internal/external aids  TR Interventions    SW/CM Interventions Discharge Planning,Psychosocial Support,Patient/Family Education   Barriers to Discharge MD  Medical stability, Home enviroment access/loayout, Wound care, Lack of/limited family support, Weight and Weight bearing restrictions  Nursing      PT Decreased caregiver support,Home environment access/layout,Behavior,Lack of/limited  family support,Incontinence    OT Decreased caregiver support Will need to go to daughters in [place] if need 24/7 supervision  SLP      SW       Team Discharge Planning: Destination: PT-Home (with family) ,OT- Home , SLP-Home Projected Follow-up: PT-Home health PT, OT-  Home health OT, SLP-Home Health SLP,24 hour supervision/assistance,Outpatient SLP Projected Equipment Needs: PT-To be determined, OT- 3 in 1 bedside comode,To be determined,Tub/shower seat, SLP-None recommended by SLP Equipment Details: PT- , OT-  Patient/family involved in discharge planning: PT- Patient,  OT-Patient unable/family or caregiver not available, SLP-Patient  MD ELOS: 10-14 days Medical Rehab Prognosis:  Good Assessment: Pt is a 69 yr old female s/p fall and large SDH s/p R craniotomy with staples intact and impulsiveness, and mild weakness. On Elavil for FMS, Lyrica, and has tight trigger points in R neck- also having daily Headaches- started  topamax for prevention- also having issues with dysphagia, urinary retention since crani-   Goals supervision by d/c.      See Team Conference Notes for weekly updates to the plan of care

## 2020-09-02 NOTE — Progress Notes (Signed)
Inpatient Rehabilitation  Patient information reviewed and entered into eRehab system by Dierdra Salameh M. Veida Spira, M.A., CCC/SLP, PPS Coordinator.  Information including medical coding, functional ability and quality indicators will be reviewed and updated through discharge.    

## 2020-09-02 NOTE — Plan of Care (Signed)
  Problem: Consults Goal: RH BRAIN INJURY PATIENT EDUCATION Description: Description: See Patient Education module for eduction specifics Outcome: Progressing Goal: Skin Care Protocol Initiated - if Braden Score 18 or less Description: If consults are not indicated, leave blank or document N/A Outcome: Progressing   Problem: RH BOWEL ELIMINATION Goal: RH STG MANAGE BOWEL WITH ASSISTANCE Description: STG Manage Bowel with supervision Assistance. Outcome: Progressing Goal: RH STG MANAGE BOWEL W/MEDICATION W/ASSISTANCE Description: STG Manage Bowel with Medication with Assistance. Outcome: Progressing   Problem: RH BLADDER ELIMINATION Goal: RH STG MANAGE BLADDER WITH ASSISTANCE Description: STG Manage Bladder With supervision Assistance Outcome: Progressing   Problem: RH SKIN INTEGRITY Goal: RH STG SKIN FREE OF INFECTION/BREAKDOWN Description: Patient's skin will be free of infections and breakdown with minimal assistance Outcome: Progressing Goal: RH STG MAINTAIN SKIN INTEGRITY WITH ASSISTANCE Description: STG Maintain Skin Integrity With minimal Assistance. Outcome: Progressing   Problem: RH SAFETY Goal: RH STG ADHERE TO SAFETY PRECAUTIONS W/ASSISTANCE/DEVICE Description: STG Adhere to Safety Precautions With minimal Assistance/Device. Outcome: Progressing   Problem: RH COGNITION-NURSING Goal: RH STG USES MEMORY AIDS/STRATEGIES W/ASSIST TO PROBLEM SOLVE Description: STG Uses Memory Aids/Strategies With minimal Assistance to Problem Solve. Outcome: Progressing   Problem: RH PAIN MANAGEMENT Goal: RH STG PAIN MANAGED AT OR BELOW PT'S PAIN GOAL Description: Pain will be managed at or below 4 out of 10 on pain scale with minimal assist Outcome: Progressing   Problem: RH KNOWLEDGE DEFICIT BRAIN INJURY Goal: RH STG INCREASE KNOWLEDGE OF SELF CARE AFTER BRAIN INJURY Description: Patient/family will increase knowledge of self care after brain injury with handouts and additional  education from nursing and therapies with minimal assistance Outcome: Progressing   

## 2020-09-02 NOTE — Evaluation (Signed)
Speech Language Pathology Assessment and Plan  Patient Details  Name: Amy Bruce MRN: [id number] Date of Birth: 10/26/1950  SLP Diagnosis: Cognitive Impairments;Dysphagia  Rehab Potential: Good ELOS: 10-14 days    Today's Date: 09/02/2020 SLP Individual Time: 0800-0900 SLP Individual Time Calculation (min): 60 min   Hospital Problem: Principal Problem:   Traumatic subdural hematoma ([place])  Past Medical History:  Past Medical History:  Diagnosis Date  . Anemia    in the past  . Anxiety    yrs. ago panic attacks  . Arthritis   . 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 Complication of anesthesia    pt. reported her father became deaf after having anesthesia  . Fibromyalgia   . GERD (gastroesophageal reflux disease)   . Heart murmur    due to rheumatic fever, PCP no longer hears it  . Hyperlipidemia   . Hypertension   . Myalgia   . Neuropathy   . PAD (peripheral artery disease) ([place])   . PAD (peripheral artery disease) ([place])   . Pre-diabetes   . Pyelonephritis 09/10/2019    "septic"  . Tobacco abuse   . 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 ABDOMINAL AORTOGRAM W/LOWER EXTREMITY N/A 04/02/2018   Procedure: ABDOMINAL AORTOGRAM W/LOWER EXTREMITY;  Surgeon: Wellington Hampshire, MD;  Location: [facility];  Service: Cardiovascular;  Laterality: N/A;  . ABDOMINAL AORTOGRAM W/LOWER EXTREMITY N/A 09/23/2019   Procedure: ABDOMINAL AORTOGRAM W/LOWER EXTREMITY;  Surgeon: Wellington Hampshire, MD;  Location: [facility];  Service: Cardiovascular;  Laterality: N/A;  . ABDOMINAL HYSTERECTOMY  1999  . CERVICAL SPINE SURGERY  [id number]   C5/6 6/7  . COLONOSCOPY    . CRANIOTOMY Right 08/23/2020   Procedure: RIGHT CRANIOTOMY HEMATOMA EVACUATION SUBDURAL;  Surgeon: Consuella Lose, MD;  Location: [facility];  Service: Neurosurgery;  Laterality: Right;  . ECTOPIC  PREGNANCY SURGERY    . Left axillary cyst removal 1989    . MULTIPLE EXTRACTIONS WITH ALVEOLOPLASTY N/A 10/25/2017   Procedure: EXTRACTION NUMBERS FIVE, SEVEN, EIGHT, NINE, TEN, FIFTEEN, EIGHTEEN WITH ALVEOLOPLASTY, IRRIGATION AND DEBRIDEMENT LEFT SUBMANDIBULAR INFECTION;  Surgeon: Diona Browner, DDS;  Location: [facility];  Service: Oral Surgery;  Laterality: N/A;  . PERIPHERAL VASCULAR ATHERECTOMY Right 09/23/2019   Procedure: PERIPHERAL VASCULAR ATHERECTOMY;  Surgeon: Wellington Hampshire, MD;  Location: [facility];  Service: Cardiovascular;  Laterality: Right;  SFA  . PERIPHERAL VASCULAR BALLOON ANGIOPLASTY Right 09/23/2019   Procedure: PERIPHERAL VASCULAR BALLOON ANGIOPLASTY;  Surgeon: Wellington Hampshire, MD;  Location: [facility];  Service: Cardiovascular;  Laterality: Right;  SFA  . RADIOLOGY WITH ANESTHESIA N/A 11/18/2018   Procedure: MRI OF THE CERVICAL SPINE WITHOUT CONTRAST;  Surgeon: Radiologist, Medication, MD;  Location: [facility];  Service: Radiology;  Laterality: N/A;  . RADIOLOGY WITH ANESTHESIA N/A 12/03/2019   Procedure: MRI WITH ANESTHESIA CERVICAL WITH AND WITHOUT CONTRAST;  Surgeon: Radiologist, Medication, MD;  Location: [facility];  Service: Radiology;  Laterality: N/A;  . TONSILECTOMY, ADENOIDECTOMY, BILATERAL MYRINGOTOMY AND TUBES  1981   put not aware of tubes being put in ears  . TUBAL LIGATION  1981    Assessment / Plan / Recommendation Clinical Impression Patient is a 69 y.o. year old female with history of anxiety, cervical fusion 04/2020, obesity with BMI 32.68, prediabetes, fibromyalgia, hyperlipidemia, hypertension, PAD maintained on  aspirin and Plavix as well as history of tobacco use. Per chart review patient lives alone in [place]. Reportedly independent prior to admission. She plans to stay with her daughter in [place] on discharge. Presented 08/23/2020 after reported fall outside of her doctor's office when she lost her balance. No reported loss of  consciousness. Cranial CT scan showed acute right cerebral convexity greater than falcine subdural hematoma. 12 mm leftward midline shift. No definite ventricle trapping. CT cervical spine negative for acute fracture or subluxation. CT abdomen pelvis no evidence of acute traumatic injury. Admission chemistries urinalysis negative nitrite urine screen negative, chemistries unremarkable aside calcium 8.6 WBC 10,800. Follow-up neurosurgery for SDH underwent right frontal parietal craniotomy for evacuation of subdural hematoma 08/23/2020 per Dr. Kathyrn Sheriff. EEG negative for seizure. Maintained on Keppra for seizure prophylaxis. Currently on a mechanical soft thin liquid diet as well as a nasogastric tube had been in place for nutritional support. Bouts of urinary retention placed on low-dose Urecholine. Due to patient's decrease in functional ability altered mental status she was admitted for an inpatient rehab comprehensive therapy course.  Patient transferred to CIR on 09/01/2020 .   Pt presents with mild-moderate cognitive linguistic impairments correlating with [place]. Pt demonstrated orientation x4, general awareness of deficits and occasional awareness of errors. SLP administered subsections of Cognistat in which pt scored severe impairments in construction task, moderate deficits in attention, difficulty following 3 step commands and recalled 3 out 4 words with a five minute delay. Pt did expressed headache that could also have impacted today's performance. Pt demonstrated WFL oral motor skills. Pt demonstrated appropriate mastication, oral clearance and swallow appeared timely during PO consumption trials of dys 3 textures, regular textures and thin liquids via straw/cup. Pt demonstrated no overt s/s aspiration. SLP recommended diet of regular textures with intermittent supervision to assess upgrade tolerance, set up tray and cutting up items if needed. Pt's speech was [facility]. Pt would benefit  from skilled ST services in order to maximize functional independence and reduce burden of care requiring, supervision and continued ST services at discharge.    Skilled Therapeutic Interventions          Skilled ST services focused on cognitive skills. SLP administered cognitive linguistic assessment subsections for Cognistat. SLP educated pt on results and goals for treatment. All questions were answered to satisfaction. Pt was left with call bell within reach and bed alarm set. Recommend to continue skilled ST services.   SLP Assessment  Patient will need skilled Speech Lanaguage Pathology Services during CIR admission    Recommendations  SLP Diet Recommendations: Thin;Age appropriate regular solids Liquid Administration via: Straw;Cup Medication Administration: Whole meds with liquid Supervision: Patient able to self feed;Intermittent supervision to cue for compensatory strategies Compensations: Minimize environmental distractions;Slow rate;Small sips/bites Postural Changes and/or Swallow Maneuvers: Seated upright 90 degrees Oral Care Recommendations: Oral care BID Patient destination: Home Follow up Recommendations: Home Health SLP;24 hour supervision/assistance;Outpatient SLP Equipment Recommended: None recommended by SLP    SLP Frequency 3 to 5 out of 7 days   SLP Duration  SLP Intensity  SLP Treatment/Interventions 10-14 days  Minumum of 1-2 x/day, 30 to 90 minutes  Cognitive remediation/compensation;Cueing hierarchy;Dysphagia/aspiration precaution training;Functional tasks;Medication managment;Patient/family education;Internal/external aids    Pain Pain Assessment Pain Scale: 0-10 Pain Score: 6  Pain Type: Acute pain Pain Location: Head Pain Orientation: Posterior Pain Descriptors / Indicators: Headache Pain Onset: On-going Pain Intervention(s): MD notified (Comment);RN made aware  Prior Functioning Cognitive/Linguistic Baseline: Within functional limits Type of  Home: House  Lives With: Alone Available Help at Discharge: Family;Available 24 hours/day Vocation: Retired  Programmer, systems Overall Cognitive Status: Impaired/Different from baseline Arousal/Alertness: Awake/alert Orientation Level: Oriented X4 Attention: Sustained Focused Attention: Appears intact Sustained Attention: Impaired Sustained Attention Impairment: Verbal complex;Functional complex Memory: Impaired Memory Impairment: Decreased recall of new information;Decreased short term memory Awareness: Impaired Awareness Impairment: Emergent impairment Problem Solving: Impaired Problem Solving Impairment: Verbal complex;Functional complex (mildly complex) Safety/Judgment: Appears intact [place] [employer] Scales of Cognitive Functioning: Automatic/appropriate  Comprehension Auditory Comprehension Overall Auditory Comprehension: Impaired Yes/No Questions: Within Functional Limits Commands: Impaired One Step Basic Commands: 75-100% accurate Two Step Basic Commands: 75-100% accurate Multistep Basic Commands: 50-74% accurate (further impacted by recall deficits) Conversation: Simple Interfering Components: Attention;Working Curator: Within [employer] Reading Comprehension Reading Status: Not tested Expression Expression Primary Mode of Expression: Verbal Verbal Expression Overall Verbal Expression: Appears within functional limits for tasks assessed Oral Motor Oral Motor/Sensory Function Overall Oral Motor/Sensory Function: Within functional limits Motor Speech Overall Motor Speech: Appears within functional limits for tasks assessed Respiration: Within functional limits Phonation: Normal Resonance: Within functional limits Articulation: Within functional limitis Intelligibility: Intelligible Motor Planning: Witnin functional limits Motor Speech Errors: Not applicable  Care Tool Care Tool  Cognition Expression of Ideas and Wants Expression of Ideas and Wants: Without difficulty (complex and basic) - expresses complex messages without difficulty and with speech that is clear and easy to understand   Understanding Verbal and Non-Verbal Content Understanding Verbal and Non-Verbal Content: Usually understands - understands most conversations, but misses some part/intent of message. Requires cues at times to understand   Memory/Recall Ability *first 3 days only       PMSV Assessment  PMSV Trial Intelligibility: Intelligible  Bedside Swallowing Assessment General Date of Onset: 08/23/20 Previous Swallow Assessment: None Diet Prior to this Study: Dysphagia 3 (soft);Thin liquids Respiratory Status: Room air History of Recent Intubation: Yes Length of Intubations (days): 1 days Date extubated: 08/24/20 Behavior/Cognition: Alert;Cooperative;Pleasant mood Oral Cavity - Dentition: Dentures, bottom;Dentures, top Self-Feeding Abilities: Able to feed self Patient Positioning: Upright in bed Baseline Vocal Quality: Normal Volitional Cough: Strong Volitional Swallow: Able to elicit  Oral Care Assessment Does patient have any of the following "high(er) risk" factors?: None of the above Does patient have any of the following "at risk" factors?: Lips - dry, cracked;Other - dysphagia Patient is HIGH RISK: Non-ventilated: Order set for Adult Oral Care Protocol initiated - "High Risk Patients - Non-Ventilated" option selected  (see row information) Ice Chips Ice chips: Not tested Thin Liquid Thin Liquid: Within functional limits Presentation: Cup;Self Fed;Straw Nectar Thick Nectar Thick Liquid: Not tested Honey Thick Honey Thick Liquid: Not tested Puree Puree: Within functional limits Presentation: Self Fed;Spoon Solid Solid: Within functional limits Presentation: Self Fed;Spoon BSE Assessment Risk for Aspiration Impact on safety and function: Mild aspiration risk Other  Related Risk Factors: Cognitive impairment  Short Term Goals: Week 1: SLP Short Term Goal 1 (Week 1): Pt will demonstrate sustained attention in 30 minute intervals with supervision A verbal cues for redirection. SLP Short Term Goal 2 (Week 1): Pt will complete mildly-complex functional problem solving tasks (ex: money/medication/time managament) with min A verbal cues. SLP Short Term Goal 3 (Week 1): Pt will self-monitor and self-correct functional errors with min A verbal cues. SLP Short Term Goal 4 (Week 1): Pt will recall daily information with min A verbal cues for compensatory strategies. SLP Short Term Goal 5 (Week 1): Pt will consume regular textures  and thin liquid diet with Mod I use of swallow strategies.  Refer to Care Plan for Long Term Goals  Recommendations for other services: None   Discharge Criteria: Patient will be discharged from SLP if patient refuses treatment 3 consecutive times without medical reason, if treatment goals not met, if there is a change in medical status, if patient makes no progress towards goals or if patient is discharged from hospital.  The above assessment, treatment plan, treatment alternatives and goals were discussed and mutually agreed upon: by patient  Jaquann Guarisco  [facility] 09/02/2020, 1:58 PM

## 2020-09-03 DIAGNOSIS — S065X0S Traumatic subdural hemorrhage without loss of consciousness, sequela: Principal | ICD-10-CM

## 2020-09-03 NOTE — Plan of Care (Signed)
°  Problem: Consults Goal: RH BRAIN INJURY PATIENT EDUCATION Description: Description: See Patient Education module for eduction specifics Outcome: Progressing Goal: Skin Care Protocol Initiated - if Braden Score 18 or less Description: If consults are not indicated, leave blank or document N/A Outcome: Progressing   Problem: RH BOWEL ELIMINATION Goal: RH STG MANAGE BOWEL WITH ASSISTANCE Description: STG Manage Bowel with supervision Assistance. Outcome: Progressing Goal: RH STG MANAGE BOWEL W/MEDICATION W/ASSISTANCE Description: STG Manage Bowel with Medication with Assistance. Outcome: Progressing   Problem: RH BLADDER ELIMINATION Goal: RH STG MANAGE BLADDER WITH ASSISTANCE Description: STG Manage Bladder With supervision Assistance Outcome: Progressing   Problem: RH SKIN INTEGRITY Goal: RH STG SKIN FREE OF INFECTION/BREAKDOWN Description: Patient's skin will be free of infections and breakdown with minimal assistance Outcome: Progressing Goal: RH STG MAINTAIN SKIN INTEGRITY WITH ASSISTANCE Description: STG Maintain Skin Integrity With minimal Assistance. Outcome: Progressing   Problem: RH SAFETY Goal: RH STG ADHERE TO SAFETY PRECAUTIONS W/ASSISTANCE/DEVICE Description: STG Adhere to Safety Precautions With minimal Assistance/Device. Outcome: Progressing   Problem: RH COGNITION-NURSING Goal: RH STG USES MEMORY AIDS/STRATEGIES W/ASSIST TO PROBLEM SOLVE Description: STG Uses Memory Aids/Strategies With minimal Assistance to Problem Solve. Outcome: Progressing   Problem: RH PAIN MANAGEMENT Goal: RH STG PAIN MANAGED AT OR BELOW PT'S PAIN GOAL Description: Pain will be managed at or below 4 out of 10 on pain scale with minimal assist Outcome: Progressing   Problem: RH KNOWLEDGE DEFICIT BRAIN INJURY Goal: RH STG INCREASE KNOWLEDGE OF SELF CARE AFTER BRAIN INJURY Description: Patient/family will increase knowledge of self care after brain injury with handouts and additional  education from nursing and therapies with minimal assistance Outcome: Progressing   

## 2020-09-03 NOTE — Progress Notes (Signed)
[facility] PHYSICAL MEDICINE & REHABILITATION PROGRESS NOTE   Subjective/Complaints:  No issues overnight  ROS:  Pt denies SOB, abd pain, CP, N/V/D  Objective:   No results found. Recent Labs    09/01/20 0206 09/02/20 0425  WBC 8.8 10.3  HGB 9.5* 10.6*  HCT 29.5* 32.5*  PLT 276 341   Recent Labs    09/01/20 0206 09/02/20 0425  NA 139 138  K 3.7 4.0  CL 103 99  CO2 27 29  GLUCOSE 134* 130*  BUN 10 12  CREATININE 0.73 0.73  CALCIUM 8.9 9.8    Intake/Output Summary (Last 24 hours) at 09/03/2020 0741 Last data filed at 09/02/2020 2045 Gross per 24 hour  Intake 960 ml  Output --  Net 960 ml        Physical Exam: Vital Signs Blood pressure 140/66, pulse 78, temperature 98.4 F (36.9 C), temperature source Oral, resp. rate 17, height 5' (1.524 m), weight 75.3 kg, SpO2 100 %.   General: No acute distress Mood and affect are appropriate Heart: Regular rate and rhythm no rubs murmurs or extra sounds Lungs: Clear to auscultation, breathing unlabored, no rales or wheezes Abdomen: Positive bowel sounds, soft nontender to palpation, nondistended Extremities: No clubbing, cyanosis, or edema Skin: No evidence of breakdown, no evidence of rash      Comments: RUE 5-/5 in biceps, triceps, WE< grip and finger abd LUE- 4+/5 in same muscles LEs- 5-5/ B/L in HF, KE, DF and PF  Skin:    Comments: Craniotomy site clean and dry with staples intact Lips really dry/cracked; no skin breakdown on buttocks or heels; 6 blisters only 1 still has fluid on L wrist/hand- other 5 are flat/popped/healing  Neurological:     Comments: Patient is alert sitting up in chair.  Mild left facial droop.  Makes eye contact with examiner.  She provides her name and age.  She knew the next holiday was Christmas.  Follows simple commands. Intact to light touch in all 4 extremities Slightly slurred speech/dysarthric, but mentation doing well- slightly impulsive.   Psychiatric:  appropriate    Assessment/Plan: 1. Functional deficits which require 3+ hours per day of interdisciplinary therapy in a comprehensive inpatient rehab setting.  Physiatrist is providing close team supervision and 24 hour management of active medical problems listed below.  Physiatrist and rehab team continue to assess barriers to discharge/monitor patient progress toward functional and medical goals  Care Tool:  Bathing    Body parts bathed by patient: Right arm,Left arm,Chest,Abdomen,Front perineal area,Right upper leg,Left upper leg,Face   Body parts bathed by helper: Buttocks,Right lower leg,Left lower leg     Bathing assist Assist Level: Moderate Assistance - Patient 50 - 74%     Upper Body Dressing/Undressing Upper body dressing   What is the patient wearing?: Pull over shirt    Upper body assist Assist Level: Supervision/Verbal cueing    Lower Body Dressing/Undressing Lower body dressing      What is the patient wearing?: Pants,Underwear/pull up     Lower body assist Assist for lower body dressing: Maximal Assistance - Patient 25 - 49%     Toileting Toileting    Toileting assist Assist for toileting: Moderate Assistance - Patient 50 - 74%     Transfers Chair/bed transfer  Transfers assist     Chair/bed transfer assist level: Minimal Assistance - Patient > 75%     Locomotion Ambulation   Ambulation assist      Assist level: Minimal Assistance -  Patient > 75% Assistive device: [place]-rolling Max distance: 18'   Walk 10 feet activity   Assist     Assist level: Minimal Assistance - Patient > 75% Assistive device: [place]-rolling   Walk 50 feet activity   Assist Walk 50 feet with 2 turns activity did not occur: Safety/medical concerns         Walk 150 feet activity   Assist Walk 150 feet activity did not occur: Safety/medical concerns         Walk 10 feet on uneven surface  activity   Assist Walk 10 feet on uneven  surfaces activity did not occur: Safety/medical concerns         Wheelchair     Assist Will patient use wheelchair at discharge?: Yes Type of Wheelchair: Manual    Wheelchair assist level: Maximal Assistance - Patient 25 - 49% Max wheelchair distance: 150    Wheelchair 50 feet with 2 turns activity    Assist        Assist Level: Moderate Assistance - Patient 50 - 74%   Wheelchair 150 feet activity     Assist      Assist Level: Maximal Assistance - Patient 25 - 49%   Blood pressure 140/66, pulse 78, temperature 98.4 F (36.9 C), temperature source Oral, resp. rate 17, height 5' (1.524 m), weight 75.3 kg, SpO2 100 %.     Medical Problem List and Plan: 1.  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  Status post right frontal parietal craniotomy evacuation of subdural hematoma 08/23/2020             -patient may  Shower if can cover craniotomy site with cap             -ELOS/Goals: 10-14 days- mod I to supervision 2.  Antithrombotics: -DVT/anticoagulation: SCDs             -antiplatelet therapy: N/A 3. Pain Management/fibromyalgia: Lyrica 100 mg twice daily, Elavil 50 mg nightly, hydrocodone as needed  12/24- added topamax for headaches- could benefit from Trigger point injections in R levators/scalenes.  Will try Lidocaine patch at night on R side of neck.  4. Mood: Provide emotional support             -antipsychotic agents: N/A 5. Neuropsych: This patient is capable of making decisions on her own behalf. 6. Skin/Wound Care: Routine skin checks- daily dressing changes on L hand/wrist 7. Fluids/Electrolytes/Nutrition: Routine in and outs with follow-up chemistries 8.  Seizure prophylaxis.  EEG negative.  Keppra 1000 mg twice daily 9.  Hypertension.  Norvasc 10 mg daily, Lopressor 25 mg twice daily.  Monitor with increased mobility Vitals:   09/02/20 1337 09/02/20 1945  BP: 118/66 140/66  Pulse: 72 78  Resp: 16 17   Temp: 97.8 F (36.6 C) 98.4 F (36.9 C)  SpO2: 96% 100%  controlled   10.  Hyperlipidemia.  Lipitor 11.  History of PAD.  Patient on aspirin Plavix prior to admission.  Currently on hold due to SDH 12.  Tobacco abuse.  Counseling 13.  Morbid obesity.  BMI 32.68.  Dietary follow-up 14.  Urinary retention.  Urecholine 10 mg 3 times daily.  Check PVR 15.  Dysphagia.  Mechanical soft thin liquids.  Nasogastric tube had been in place for nutritional support with calorie counts and hope to remove soon- getting calorie count currently.  16. Daily headaches- since crani- adding Topamax 50 mg QHS for prevention-   12/24- will  try lidocaine patch for R side of neck at night. Also added Robaxin 750 mg q6 hours prn for muscle tightness 17. Insomnia- took Elavil at home for this- will continue- daughter wants "mother to not HAVE to take any medicine"- will con't home dose.      LOS: 2 days A FACE TO FACE EVALUATION WAS PERFORMED  Amy Bruce 09/03/2020, 7:41 AM

## 2020-09-04 ENCOUNTER — Inpatient Hospital Stay (HOSPITAL_COMMUNITY): Payer: Medicare Other

## 2020-09-04 ENCOUNTER — Inpatient Hospital Stay (HOSPITAL_COMMUNITY): Payer: Medicare Other | Admitting: Speech Pathology

## 2020-09-04 LAB — GLUCOSE, CAPILLARY: Glucose-Capillary: 119 mg/dL — ABNORMAL HIGH (ref 70–99)

## 2020-09-04 NOTE — Progress Notes (Signed)
Occupational Therapy Session Note  Patient Details  Name: Amy Bruce MRN: [id number] Date of Birth: 08/11/1951  Today's Date: 09/04/2020 OT Individual Time: 0830-0930 OT Individual Time Calculation (min): 60 min    Short Term Goals: Week 1:  OT Short Term Goal 1 (Week 1): Pt will transfer with CGA consistently to get to toilet with LRAD OT Short Term Goal 2 (Week 1): Pt will don LB clothing wiht A only for standing balance OT Short Term Goal 3 (Week 1): Pt will compelte 2/3 components of toileting wiht MIN A for balance OT Short Term Goal 4 (Week 1): Pt will stand at sink to groom with MIN A to demo improved standing tolerance  Skilled Therapeutic Interventions/Progress Updates:    Pt received supine with no c/o pain, agreeable to OT session. Pt completed bed mobility with CGA to EOB. Sit> stand from EOB with min A using RW, min cueing for UE placement. Pt completed ambulatory transfer from bed to the sink with RW, with min cueing for Rw management. Pt completed standing level oral care with min A for stabilization of toothpaste onto toothbrush. Pt reported need to urinate in standing. She reported urgency and required cueing to slow down for safety. Pt began experiencing incontinence and was scissoring as a result as an attempt to control urine flow. Again, cueing for safety provided. Pt required mod A for toileting tasks to speed up transfer. Pt further voided urine and complete hygiene seated/standing with min A overall. Mod A to don/doff new socks and wipe down distally. Pt returned to the sink and reported fatigue, sitting to finish remaining oral care. She was taken via w/c to the therapy gym. Also interesting- pt reporting L hemi from SDH- not found, weakness consistent with R hemi as chart states. She completed BITS system with focus on visual scanning, BUE reaching, and sustained attention. Visual pursuit with bilateral reaching into all quadrants: 1st trial: 3.41 seconds reaction time  and 89.74% accuracy. Increased R shoulder compensation/L lateral lean with R upper quadrant reaching. 2nd trial: 3.77 seconds reaction time and 88.57% accuracy Discussed return to driving and reaction time association with not passing driving exam, but ultimately up to MD and encouraged pt to discuss this with MD, as return to driving is goal of hers. Pt was returned to her room where she was agreeable to try sitting up in the w/c. She then reported dizziness and BP was assessed- 132/74. Requested NT to check blood sugar and set up pt to eat her yogurt from breakfast. Blood sugar was 119. Pt reporting she thinks she is just hungry. Pt was left sitting up in the w/c with her yogurt and milk opened, all needs met, chair alarm belt on.   Therapy Documentation Precautions:  Precautions Precautions: Fall Restrictions Weight Bearing Restrictions: No   Therapy/Group: Individual Therapy  Sandra H Davis 09/04/2020, 7:30 AM  

## 2020-09-04 NOTE — Progress Notes (Signed)
Physical Therapy Session Note  Patient Details  Name: Amy Bruce MRN: [id number] Date of Birth: 1951/08/19  Today's Date: 09/04/2020 PT Individual Time: 1010-1110 PT Individual Time Calculation (min): 60 min   Short Term Goals: Week 1:  PT Short Term Goal 1 (Week 1): pt to demonstrate supine<>sit CGA with limited use of bedrails PT Short Term Goal 2 (Week 1): pt to demonstrate functional transfers with LRAD at min A consistently PT Short Term Goal 3 (Week 1): pt to demonstrate gait training with LRAD at 100' min A  Skilled Therapeutic Interventions/Progress Updates:     Session 1: Patient in w/c asleep upon PT arrival. Patient easily aroused and agreeable to PT session. Patient denied pain during session. Reported that she did not sleep well last night and is very tired today.  Patient had pilled milk on her gown, provided patient with a new gown and she doffed/donned gowns with min A for untying/tying. Discussed home set up, ELOS, PT goals, and POC at beginning of session. Patient oriented x4, able to recall all but directly prior to her fall, unable to determine what prompted the fall due to decreased recall. Educated on TBI signs and symptoms. Patient reports dizziness at all times that gets worse with mobility. Patient with up beating nystagmus when lying down in the bed during session, lasted ~45 sec then resolved. Educated on central and peripheral dizziness and discussed vestibular evaluation this week, patient in agreement.   Therapeutic Activity: Bed Mobility: Patient performed sit to supine with supervision. Provided verbal cues for decreased use of bed rails to simulate home set up. Transfers: Patient performed sit to/from stand x4 with min-CGA without AD. Provided verbal cues for scooting forward, forward weight shift, and pushing up/reaching back to sit for safety.  Gait Training:  Patient ambulated 40 feet and 60 feet using B upper extremity support on therapist's  forearms x1 trial and HHA progressing to no AD on second trial with min A. Ambulated with decreased gait speed, decreased B step length and height alternating from step-to to step-through gait pattern due to fear of falling, patient with downward head gaze throughout, narrow BOS, and mild LOB R x3. Provided verbal cues for increased step height and length with facilitation for B lateral weight shift, increased BOS, visual scanning and looking ahead for safety, and promoted increased arm swing without upper extremity support.  Patient required increased time and rest breaks with activity due to decreased activity tolerance and fear of falling. Provided education on balance deficits and progression with therapy. Patient stated she hopes to walk without the walker, as she did PTA.   Patient in bed due to fatigue at end of session with breaks locked, bed alarm set, and all needs within reach.   Session 2: Patient in bed asleep upon PT arrival. Patient easily aroused and agreeable to PT session. Patient reported 5/10 headache during session, RN provided pain medicine prior to session. PT provided repositioning, rest breaks, and distraction as pain interventions throughout session.   Therapeutic Activity: Bed Mobility: Patient performed boosting up in the bed with max A +1, utilized B feet to push herself up with cues.   Discussed sleep habits at home and in the hospital. Patient reports decreased sleep in the hospital and especially last night due to the holiday and pathology interrupting her sleep this morning. Educated on importance of sleep for functional mobility, cognitive recovery, and healing. Patient stated understanding and in agreement to turn lights and phone/TV off at  9pm tonight to improve sleep quality. RN made aware.   Patient falling asleep mid sentence during discussion. Patient missed 15 min of skilled PT due to fatigue, RN made aware. Will attempt to make-up missed time as able.     Patient in bed asleep at end of session with breaks locked, bed alarm set, and all needs within reach.     Therapy Documentation Precautions:  Precautions Precautions: Fall Restrictions Weight Bearing Restrictions: No   Therapy/Group: Individual Therapy  Anaija Wissink L Marlet Korte PT, DPT  09/04/2020, 4:15 PM

## 2020-09-04 NOTE — Progress Notes (Signed)
Speech Language Pathology Daily Session Note  Patient Details  Name: Amy Bruce MRN: [id number] Date of Birth: 1951-01-27  Today's Date: 09/04/2020 SLP Individual Time: 1115-1200 SLP Individual Time Calculation (min): 45 min  Short Term Goals: Week 1: SLP Short Term Goal 1 (Week 1): Pt will demonstrate sustained attention in 30 minute intervals with supervision A verbal cues for redirection. SLP Short Term Goal 2 (Week 1): Pt will complete mildly-complex functional problem solving tasks (ex: money/medication/time managament) with min A verbal cues. SLP Short Term Goal 3 (Week 1): Pt will self-monitor and self-correct functional errors with min A verbal cues. SLP Short Term Goal 4 (Week 1): Pt will recall daily information with min A verbal cues for compensatory strategies. SLP Short Term Goal 5 (Week 1): Pt will consume regular textures and thin liquid diet with Mod I use of Bruce strategies.  Skilled Therapeutic Interventions: Skilled treatment session focused on dysphagia and cognitive goals. SLP facilitated session by providing skilled observation with a snack of regular textures. Patient demonstrated efficient mastication and complete oral clearance without overt s/s of aspiration, therefore, recommend patient continue current diet. During snack, patient participated in a functional conversation about interests and required Min verbal cues to alternate between self-feeding and conversation. Patient also participated in a basic money management task and was overall Mod I for problem solving. Patient left upright in bed with alarm on and all needs within reach. Continue with current plan of care.      Pain Pain Assessment Pain Scale: 0-10 Pain Score: 6  Pain Type: Acute pain Pain Location: Head Pain Orientation: Posterior Pain Descriptors / Indicators: Aching Pain Frequency: Intermittent Pain Onset: On-going Patients Stated Pain Goal: 1 Pain Intervention(s): Medication (See  eMAR)  Therapy/Group: Individual Therapy  Amy Bruce 09/04/2020, 12:41 PM

## 2020-09-04 NOTE — Plan of Care (Signed)
  Problem: Consults Goal: RH BRAIN INJURY PATIENT EDUCATION Description: Description: See Patient Education module for eduction specifics Outcome: Progressing Goal: Skin Care Protocol Initiated - if Braden Score 18 or less Description: If consults are not indicated, leave blank or document N/A Outcome: Progressing   Problem: RH BOWEL ELIMINATION Goal: RH STG MANAGE BOWEL WITH ASSISTANCE Description: STG Manage Bowel with supervision Assistance. Outcome: Progressing Goal: RH STG MANAGE BOWEL W/MEDICATION W/ASSISTANCE Description: STG Manage Bowel with Medication with Assistance. Outcome: Progressing   Problem: RH BLADDER ELIMINATION Goal: RH STG MANAGE BLADDER WITH ASSISTANCE Description: STG Manage Bladder With supervision Assistance Outcome: Progressing   Problem: RH SKIN INTEGRITY Goal: RH STG SKIN FREE OF INFECTION/BREAKDOWN Description: Patient's skin will be free of infections and breakdown with minimal assistance Outcome: Progressing Goal: RH STG MAINTAIN SKIN INTEGRITY WITH ASSISTANCE Description: STG Maintain Skin Integrity With minimal Assistance. Outcome: Progressing   Problem: RH SAFETY Goal: RH STG ADHERE TO SAFETY PRECAUTIONS W/ASSISTANCE/DEVICE Description: STG Adhere to Safety Precautions With minimal Assistance/Device. Outcome: Progressing   Problem: RH COGNITION-NURSING Goal: RH STG USES MEMORY AIDS/STRATEGIES W/ASSIST TO PROBLEM SOLVE Description: STG Uses Memory Aids/Strategies With minimal Assistance to Problem Solve. Outcome: Progressing   Problem: RH PAIN MANAGEMENT Goal: RH STG PAIN MANAGED AT OR BELOW PT'S PAIN GOAL Description: Pain will be managed at or below 4 out of 10 on pain scale with minimal assist Outcome: Progressing   Problem: RH KNOWLEDGE DEFICIT BRAIN INJURY Goal: RH STG INCREASE KNOWLEDGE OF SELF CARE AFTER BRAIN INJURY Description: Patient/family will increase knowledge of self care after brain injury with handouts and additional  education from nursing and therapies with minimal assistance Outcome: Progressing   

## 2020-09-05 ENCOUNTER — Inpatient Hospital Stay (HOSPITAL_COMMUNITY): Payer: Medicare Other

## 2020-09-05 ENCOUNTER — Inpatient Hospital Stay (HOSPITAL_COMMUNITY): Payer: Medicare Other | Admitting: Occupational Therapy

## 2020-09-05 ENCOUNTER — Inpatient Hospital Stay (HOSPITAL_COMMUNITY): Payer: Medicare Other | Admitting: Speech Pathology

## 2020-09-05 MED ORDER — TRAZODONE HCL 50 MG PO TABS: 50.0000 mg | ORAL_TABLET | Freq: Every evening | ORAL | Status: DC | PRN

## 2020-09-05 MED ORDER — POLYETHYLENE GLYCOL 3350 17 G PO PACK
17.0000 g | PACK | Freq: Every day | ORAL | Status: DC
  Administered 2020-09-06 – 2020-09-17 (×12): 17 g via ORAL
  Filled 2020-09-05 (×12): qty 1

## 2020-09-05 MED ORDER — TOPIRAMATE 25 MG PO TABS
75.0000 mg | ORAL_TABLET | Freq: Every day | ORAL | Status: DC
  Administered 2020-09-05 – 2020-09-17 (×13): 75 mg via ORAL
  Filled 2020-09-05 (×14): qty 3

## 2020-09-05 NOTE — Care Management (Signed)
[facility]patient [facility] Individual Statement of Services  Patient Name:  Amy Bruce  Date:  09/05/2020  Welcome to the Inpatient Rehabilitation Center.  Our goal is to provide you with an individualized program based on your diagnosis and situation, designed to meet your specific needs.  With this comprehensive rehabilitation program, you will be expected to participate in at least 3 hours of rehabilitation therapies Monday-Friday, with modified therapy programming on the weekends.  Your rehabilitation program will include the following services:  Physical Therapy (PT), Occupational Therapy (OT), Speech Therapy (ST), 24 hour per day rehabilitation nursing, Therapeutic Recreaction (TR), Psychology, Neuropsychology, Care Coordinator, Rehabilitation Medicine, Nutrition Services, Pharmacy Services and Other  Weekly team conferences will be held on Tuesdays to discuss your progress.  Your Inpatient Rehabilitation Care Coordinator will talk with you frequently to get your input and to update you on team discussions.  Team conferences with you and your family in attendance may also be held.  Expected length of stay: 10-14 days  Overall anticipated outcome: Supervision  Depending on your progress and recovery, your program may change. Your Inpatient Rehabilitation Care Coordinator will coordinate services and will keep you informed of any changes. Your Inpatient Rehabilitation Care Coordinator's name and contact numbers are listed  below.  The following services may also be recommended but are not provided by the Inpatient Rehabilitation Center:   Driving Evaluations  Home Health Rehabiltiation Services  Outpatient Rehabilitation Services  Vocational Rehabilitation   Arrangements will be made to provide these services after discharge if needed.  Arrangements include referral to agencies that provide these services.  Your insurance has been verified to be:  [facility]  Your  primary doctor is:  Farris Has  Pertinent information will be shared with your doctor and your insurance company.  Inpatient Rehabilitation Care Coordinator:  Susie Cassette [phone] or ([phone]  Information discussed with and copy given to patient by: Gretchen Short, 09/05/2020, 7:09 PM

## 2020-09-05 NOTE — Progress Notes (Signed)
Occupational Therapy Session Note  Patient Details  Name: Amy Bruce MRN: [id number] Date of Birth: 1951/08/15  Today's Date: 09/05/2020 OT Individual Time: 0947-0962 OT Individual Time Calculation (min): 56 min   Short Term Goals: Week 1:  OT Short Term Goal 1 (Week 1): Pt will transfer with CGA consistently to get to toilet with LRAD OT Short Term Goal 2 (Week 1): Pt will don LB clothing wiht A only for standing balance OT Short Term Goal 3 (Week 1): Pt will compelte 2/3 components of toileting wiht MIN A for balance OT Short Term Goal 4 (Week 1): Pt will stand at sink to groom with MIN A to demo improved standing tolerance  Skilled Therapeutic Interventions/Progress Updates:    Pt greeted in bed and premedicated for HA pain per report. She had just returned from the bathroom with NT, reported feeling somewhat dizzy when ambulating. Declining shower, opting to complete sponge bathing EOB at sit<stand level. OT also retrieved her some aromatherapy for pillowcase to address HA. Pt and dtr appreciative. Bed mobility completed with supervision assist and pt wore a glove on the Lt hand to protect bandages. She completed UB bathing/dressing tasks with supervision, cues for attention to task due to tangential tendencies. Increased time required due to expressive aphasia/slowness of speech and decreased speed of gross motor movements. She then reported feeling dizzy/increased HA pain and needed to lie back down. BP: 135/56. Pt reported dizziness absolved once lying down but that severity was about "5/10." +2 for boosting her up in bed and Total A to change her socks. She remained in bed at close of session, left with all needs within reach and bed alarm set.   Therapy Documentation Precautions:  Precautions Precautions: Fall Restrictions Weight Bearing Restrictions: No Vital Signs: Therapy Vitals Temp: (!) 97.2 F (36.2 C) Pulse Rate: 74 Resp: 16 BP: 126/65 Patient Position (if  appropriate): Lying Oxygen Therapy SpO2: 100 % O2 Device: Room Air ADL: ADL Grooming: Supervision/safety Where Assessed-Grooming: Edge of bed Upper Body Bathing: Supervision/safety Where Assessed-Upper Body Bathing: Edge of bed Lower Body Bathing: Moderate assistance (BSC) Where Assessed-Lower Body Bathing: Other (Comment) Upper Body Dressing: Supervision/safety Where Assessed-Upper Body Dressing: Edge of bed Lower Body Dressing: Maximal assistance Where Assessed-Lower Body Dressing: Other (Comment) (BSC) Toilet Transfer: Minimal assistance Toilet Transfer Method: Stand pivot Toilet Transfer Equipment: Bedside commode (RW)      Therapy/Group: Individual Therapy  Caprice Mccaffrey A Cherisa Brucker 09/05/2020, 4:06 PM

## 2020-09-05 NOTE — Progress Notes (Signed)
Patient ID: Amy Bruce, female   DOB: Jun 16, 1951, 69 y.o.   MRN: [id number]  Sw met with pt in room to confirm if her plan was to d/c permanently out of state with either of her daughters,and pt in agreement.   Loralee Pacas, MSW, [place] Office: [phone] Cell: [phone] Fax: [phone]

## 2020-09-05 NOTE — Progress Notes (Signed)
Physical Therapy Session Note  Patient Details  Name: Amy Bruce MRN: [id number] Date of Birth: 1951/02/02  Today's Date: 09/05/2020 PT Individual Time: 1002-1058 PT Individual Time Calculation (min): 56 min   Short Term Goals: Week 1:  PT Short Term Goal 1 (Week 1): pt to demonstrate supine<>sit CGA with limited use of bedrails PT Short Term Goal 2 (Week 1): pt to demonstrate functional transfers with LRAD at min A consistently PT Short Term Goal 3 (Week 1): pt to demonstrate gait training with LRAD at 100' min A  Skilled Therapeutic Interventions/Progress Updates:     Patient in bed upon PT arrival. Patient alert, but reporting increased fatigue due to poor sleep and agreeable to PT session. Patient reported 5-6/10 headache during session, RN made aware and provided pain medicine prior to session. PT provided repositioning, rest breaks, and distraction as pain interventions throughout session.   Therapeutic Activity: Bed Mobility: Patient performed supine to/from sit with supervision and increased time with HOB elevated due to headache this session. Provided verbal cues for slow change in positions due to nystagmus present with dizziness yesterday with this mobility. Transfers: Patient performed sit to/from stand x1 and a toilet transfer to [facility] over toilet with CGA using RW. Provided verbal cues for hand placement and reaching back to sit for safety. Patient was unsuccessful with BM during toileting, performed peri-care with supervision and lower body clothing management with min A.   Patient very fatigued and limited by headache this session. Discussed present symptoms, signs and symptoms of worsening brain injury, per patient inquiry, sleep habits and sleep hygiene, TBI recovery, and vestibular symptoms and evaluation. Differed vestibular eval today due to headache and fatigue. Will discuss with team in conference tomorrow.   Patient with slow speech, requiring increased time for  education/discussion and increased time for intermittent word finding during session.   Patient in bed at end of session with breaks locked, bed alarm set, and all needs within reach.   Discussed implementing a sleep chart and patient's sleep habits with RN after session.    Therapy Documentation Precautions:  Precautions Precautions: Fall Restrictions Weight Bearing Restrictions: No   Therapy/Group: Individual Therapy  Deondra Wigger L Kaytelyn Glore PT, DPT  09/05/2020, 7:17 PM

## 2020-09-05 NOTE — Progress Notes (Signed)
[place] PHYSICAL MEDICINE & REHABILITATION PROGRESS NOTE   Subjective/Complaints: Staples C/D/I IV in right arm-discussed with nursing, not being used.  C/o dry mouth- encouraged 6-8 glasses of water per day which will also help with constipation. +constipation: scheduled miralax.   ROS:  Pt denies SOB, abd pain, CP, N/V/D  Objective:   No results found. No results for input(s): WBC, HGB, HCT, PLT in the last 72 hours. No results for input(s): NA, K, CL, CO2, GLUCOSE, BUN, CREATININE, CALCIUM in the last 72 hours.  Intake/Output Summary (Last 24 hours) at 09/05/2020 1134 Last data filed at 09/05/2020 0900 Gross per 24 hour  Intake 360 ml  Output --  Net 360 ml        Physical Exam: Vital Signs Blood pressure 136/71, pulse 88, temperature 98.3 F (36.8 C), resp. rate 18, height 5' (1.524 m), weight 75.3 kg, SpO2 99 %. Gen: no distress, normal appearing HEENT: oral mucosa pink and moist, NCAT Cardio: Reg rate Chest: normal effort, normal rate of breathing Abd: soft, non-distended Ext: no edema Neuro:    Comments: RUE 5-/5 in biceps, triceps, WE< grip and finger abd LUE- 4+/5 in same muscles LEs- 5-5/ B/L in HF, KE, DF and PF  Skin:    Comments: Craniotomy site clean and dry with staples intact Lips really dry/cracked; no skin breakdown on buttocks or heels; 6 blisters only 1 still has fluid on L wrist/hand- other 5 are flat/popped/healing  Neurological:     Comments: Patient is alert sitting up in chair.  Mild left facial droop.  Makes eye contact with examiner.  She provides her name and age.  She knew the next holiday was Christmas.  Follows simple commands. Intact to light touch in all 4 extremities Slightly slurred speech/dysarthric, but mentation doing well- slightly impulsive.   Psychiatric: appropriate    Assessment/Plan: 1. Functional deficits which require 3+ hours per day of interdisciplinary therapy in a comprehensive inpatient rehab  setting.  Physiatrist is providing close team supervision and 24 hour management of active medical problems listed below.  Physiatrist and rehab team continue to assess barriers to discharge/monitor patient progress toward functional and medical goals  Care Tool:  Bathing    Body parts bathed by patient: Right arm,Left arm,Chest,Abdomen,Front perineal area,Right upper leg,Left upper leg,Face   Body parts bathed by helper: Buttocks,Right lower leg,Left lower leg     Bathing assist Assist Level: Moderate Assistance - Patient 50 - 74%     Upper Body Dressing/Undressing Upper body dressing   What is the patient wearing?: Pull over shirt    Upper body assist Assist Level: Supervision/Verbal cueing    Lower Body Dressing/Undressing Lower body dressing      What is the patient wearing?: Incontinence brief,Pants     Lower body assist Assist for lower body dressing: Maximal Assistance - Patient 25 - 49%     Toileting Toileting    Toileting assist Assist for toileting: Minimal Assistance - Patient > 75%     Transfers Chair/bed transfer  Transfers assist     Chair/bed transfer assist level: Minimal Assistance - Patient > 75%     Locomotion Ambulation   Ambulation assist      Assist level: Minimal Assistance - Patient > 75% Assistive device: Hand held assist Max distance: 60 ft   Walk 10 feet activity   Assist     Assist level: Minimal Assistance - Patient > 75% Assistive device: Walker-rolling   Walk 50 feet activity   Assist  Walk 50 feet with 2 turns activity did not occur: Safety/medical concerns  Assist level: Minimal Assistance - Patient > 75%      Walk 150 feet activity   Assist Walk 150 feet activity did not occur: Safety/medical concerns         Walk 10 feet on uneven surface  activity   Assist Walk 10 feet on uneven surfaces activity did not occur: Safety/medical concerns         Wheelchair     Assist Will patient use  wheelchair at discharge?: Yes Type of Wheelchair: Manual    Wheelchair assist level: Maximal Assistance - Patient 25 - 49% Max wheelchair distance: 150    Wheelchair 50 feet with 2 turns activity    Assist        Assist Level: Moderate Assistance - Patient 50 - 74%   Wheelchair 150 feet activity     Assist      Assist Level: Maximal Assistance - Patient 25 - 49%   Blood pressure 136/71, pulse 88, temperature 98.3 F (36.8 C), resp. rate 18, height 5' (1.524 m), weight 75.3 kg, SpO2 99 %.     Medical Problem List and Plan: 1.  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  Antithrombotics: -DVT/anticoagulation: SCDs             -antiplatelet therapy: N/A 3. Pain Management/fibromyalgia: Lyrica 100 mg twice daily, Elavil 50 mg nightly, hydrocodone as needed  12/27: increase topamax to 75mg   Will try Lidocaine patch at night on R side of neck.  4. Mood: Provide emotional support             -antipsychotic agents: N/A 5. Neuropsych: This patient is capable of making decisions on her own behalf. 6. Skin/Wound Care: Routine skin checks- daily dressing changes on L hand/wrist 7. Fluids/Electrolytes/Nutrition: Routine in and outs with follow-up chemistries 8.  Seizure prophylaxis.  EEG negative.  Keppra 1000 mg twice daily 9.  Hypertension.  Norvasc 10 mg daily, Lopressor 25 mg twice daily.  Monitor with increased mobility Vitals:   09/05/20 0507 09/05/20 0838  BP: 121/61 136/71  Pulse: 88   Resp: 18   Temp: 98.3 F (36.8 C)   SpO2: 99%   controlled- continue current regimen.  10.  Hyperlipidemia.  Lipitor 11.  History of PAD.  Patient on aspirin Plavix prior to admission.  Currently on hold due  to SDH 12.  Tobacco abuse.  Counseling 13.  Morbid obesity.  BMI 32.68.  Dietary follow-up 14.  Urinary retention.  Urecholine 10 mg 3 times daily.  Check PVR 15.  Dysphagia.  Mechanical soft thin liquids.  Nasogastric tube had been in place for nutritional support with calorie counts and hope to remove soon- getting calorie count currently.  16. Daily headaches- since crani- adding Topamax 50 mg QHS for prevention-   12/24- will try lidocaine patch for R side of neck at night. Also added Robaxin 750 mg q6 hours prn for muscle tightness 17. Insomnia- took Elavil at home for this- will continue- daughter wants "mother to not HAVE to take any medicine"- will con't home dose.  18. Constipation: schedule Miralax.      LOS: 4 days A FACE TO FACE EVALUATION WAS PERFORMED  Amy Bruce 09/05/2020, 11:34 AM

## 2020-09-05 NOTE — Progress Notes (Signed)
Speech Language Pathology Daily Session Note  Patient Details  Name: Amy Bruce MRN: [id number] Date of Birth: 26-Jul-1951  Today's Date: 09/05/2020 SLP Individual Time: 7517-0017 SLP Individual Time Calculation (min): 55 min  Short Term Goals: Week 1: SLP Short Term Goal 1 (Week 1): Pt will demonstrate sustained attention in 30 minute intervals with supervision A verbal cues for redirection. SLP Short Term Goal 2 (Week 1): Pt will complete mildly-complex functional problem solving tasks (ex: money/medication/time managament) with min A verbal cues. SLP Short Term Goal 3 (Week 1): Pt will self-monitor and self-correct functional errors with min A verbal cues. SLP Short Term Goal 4 (Week 1): Pt will recall daily information with min A verbal cues for compensatory strategies. SLP Short Term Goal 5 (Week 1): Pt will consume regular textures and thin liquid diet with Mod I use of swallow strategies.  Skilled Therapeutic Interventions: Skilled treatment session focused on cognitive goals. Upon arrival, patient requested to use the bathroom. Patient ambulated to the commode with the RW with CGA. Patient unable to have a bowel movement, suspect due to constipation from pain medications. RN aware. SLP facilitated session by providing a complex calendar/schedling task. Patient required Mod verbal cues for sustained attention to task and task was unable to be completed during session. Patient requested to keep the task at her bedside to finish later today. Patient left upright in bed with alarm on and all needs within reach. Continue with current plan of care.      Pain No/Denies Pain   Therapy/Group: Individual Therapy  Amy Bruce 09/05/2020, 2:45 PM

## 2020-09-06 ENCOUNTER — Inpatient Hospital Stay (HOSPITAL_COMMUNITY): Payer: Medicare Other

## 2020-09-06 ENCOUNTER — Inpatient Hospital Stay (HOSPITAL_COMMUNITY): Payer: Medicare Other | Admitting: Speech Pathology

## 2020-09-06 ENCOUNTER — Inpatient Hospital Stay (HOSPITAL_COMMUNITY): Payer: Medicare Other | Admitting: Occupational Therapy

## 2020-09-06 MED ORDER — AMITRIPTYLINE HCL 50 MG PO TABS
75.0000 mg | ORAL_TABLET | Freq: Every day | ORAL | Status: DC
  Administered 2020-09-06 – 2020-09-16 (×11): 75 mg via ORAL
  Filled 2020-09-06 (×11): qty 2

## 2020-09-06 NOTE — Patient Care Conference (Signed)
Inpatient RehabilitationTeam Conference and Plan of Care Update Date: 09/06/2020   Time: 10:01 AM    Patient Name: Amy Bruce      Medical Record Number: [id number]  Date of Birth: 20-Dec-1950 Sex: Female         Room/Bed: 4M05C/4M05C-01 Payor Info: Payor: [employer] MEDICARE / Plan: [facility] MEDICARE / Product Type: *No Product type* /    Admit Date/Time:  09/01/2020  4:35 PM  Primary Diagnosis:  Traumatic subdural hematoma [facility])  Hospital Problems: Principal Problem:   Traumatic subdural hematoma [facility])    Expected Discharge Date: Expected Discharge Date: 09/13/20  Team Members Present: Physician leading conference: Dr. Leeroy Cha Care Coodinator Present: Dorthula Nettles, RN, BSN, CRRN;Becky Dupree, LCSW Nurse Present: Isla Pence, RN PT Present: Apolinar Junes, PT OT Present: Laverle Hobby, OT SLP Present: Weston Anna, SLP PPS Coordinator present : Gunnar Fusi, Novella Olive, PT     Current Status/Progress Goal Weekly Team Focus  Bowel/Bladder   continent of B&B [facility] 12/27  maintian continence  Asses toileting needs Q2h and PRN   Swallow/Nutrition/ Hydration   Regular textures with thin liquids, Mod I  Mod I  Goal Met   ADL's   (S) UB ADLs, min A LB ADLs, limited by incontinence at times that results in unsafe transfers, limited endurance/nausea  Supervision overall  Safety awareness, ADLs, ADL transfers, d/c planning, cognitive retraining   Mobility   Patient limited by poor sleep, headache, and dizziness with mobility, currently CGA-min A overall with intermittent mod A for balance with gait  Supervision-mod I overall  Activity tolerance   Communication             Safety/Cognition/ Behavioral Observations  Min A  Supervision  complex problme solving, recall and attention   Pain   Pt  c/o pain of a 6-7 to head currently well managed on hydrocodone  maintain a pain goal less than 3  Assess Pain Qshift and PRN   Skin   left hand kerlix to  blister right craniaotomy incision well approximated  prevent new breakdown  Assess skin qshift and PRN     Discharge Planning:  Pt to d/c to home with either dtr pending level of care needs at d/c: Dtr [place] if full superivison (lives in [place], [place]) or Dtr [place] if intermittent (lives in [place]).   Team Discussion: Jodell Cipro look good, no signs of infection. Headaches improved, declines PRN medications for headaches. Will write order for TED's for dizziness. Continent B/B, discharge depends on level of supervision. OT reports patient is supervision for upper body ADL's, min assist for lower body ADL's, does have urgency which can cause incontinence during session. PT reports patient has not been sleeping well but she tend to stay up late hours. Asking for a sleep chart. Asking if she is cleared for extreme positions. Asking for a Vestibular eval. SLP reports patient has memory impairment but is learning to redirect herself. Mouth gets dry and encouraging more fluids. Daughter plans to bring in a travel type cup so she doesn't have to struggle with the lid. Patient on target to meet rehab goals: yes, with continued work on endurance  *See Care Plan and progress notes for long and short-term goals.   Revisions to Treatment Plan:  Sleep Chart Family education Ted hose for dizziness Continue Topamax 75 mg for headaches Vestibular eval.  Teaching Needs: Family education Wound care Bowel education  Current Barriers to Discharge: Decreased caregiver support, Medical stability, Home enviroment access/layout, Wound  care, Lack of/limited family support, Medication compliance, Behavior and endurance.  Possible Resolutions to Barriers: Continue current medications, wound care/dressing change education, continued education for balance and endurance, provide emotional support to patient and family     Medical Summary Current Status: Headaches aer much improved, declined prns for the  headaches, continent, last BM 12/27, dizziness when standing  Barriers to Discharge: Medical stability;Wound care  Barriers to Discharge Comments: Headaches, craniotomy staples in place, dizziness when standing Possible Resolutions to [employer] Focus: Continue Topamax $RemoveBeforeDE'75mg'[id number]$ , minimize use of PRN Norco, daily wound inspection, remove staples on day 14, TEDs ordered for therapy   Continued Need for Acute Rehabilitation Level of Care: The patient requires daily medical management by a physician with specialized training in physical medicine and rehabilitation for the following reasons: Direction of a multidisciplinary physical rehabilitation program to maximize functional independence : Yes Medical management of patient stability for increased activity during participation in an intensive rehabilitation regime.: Yes Analysis of laboratory values and/or radiology reports with any subsequent need for medication adjustment and/or medical intervention. : Yes   I attest that I was present, lead the team conference, and concur with the assessment and plan of the team.   Dorthula Nettles G 09/06/2020, 1:17 PM

## 2020-09-06 NOTE — Progress Notes (Signed)
Physical Therapy Session Note  Patient Details  Name: Amy Bruce MRN: [id number] Date of Birth: 07-10-1951  Today's Date: 09/06/2020 PT Individual Time: 1300-1415 PT Individual Time Calculation (min): 75 min   Short Term Goals: Week 1:  PT Short Term Goal 1 (Week 1): pt to demonstrate supine<>sit CGA with limited use of bedrails PT Short Term Goal 2 (Week 1): pt to demonstrate functional transfers with LRAD at min A consistently PT Short Term Goal 3 (Week 1): pt to demonstrate gait training with LRAD at 100' min A  Skilled Therapeutic Interventions/Progress Updates:     Patient in w/c with her daughter in the room upon PT arrival. Patient alert and agreeable to PT session. Patient reported 3-4/10 headache during session, RN made aware. PT provided repositioning, rest breaks, and distraction as pain interventions throughout session. Patient reported that she slept well last night. Proceeded to inform PT of events from yesterday into this morning. Required increased time due to dysarthria and word finding deficits as well as delayed recall of sequence of events. Patient and her daughter with questions regarding insurance transfer with patient planning to d/c to her daughter's home in [place], differed questions to CSW at end of session.   Patient's daughter in [place] called during session. Discussed d/c planning, home set up, 24/7 supervision, PT goals, and ELOS with both daughters. Family requesting that patient d/c on a weekend to accommodate transportation needs due to transition to her daughter's residence in [place], CSW made aware and will discuss with rehab team. D/c residence is a 1 level home with level entry. Patient will need a RW at d/c for increased independence with functional mobility. Daughters report that she does not have one.   Patient performed an ambulatory toilet transfer to/from the bathroom with RW and BSC over toilet during session. Patient was continent of bladder  during toileting. Performed peri-care with supervision and lower body clothing management with min A for pulling up brief.   Performed vestibular assessment due to patient's frequent reports of dizziness with mobility. Patient reports hx of cervical surgery. Modified testing procedures, performed in the bed with bed in trendelenburg to reduce cervical extension due to decreased ROM. Vertebral artery insufficiency test negative bilaterally prior to testing. Patient educated on symptom provocation during testing and confirmed that this was the patient's last therapy of the day. Educated patient and her daughter to monitor patient for increased symptoms following testing, stated understanding and RN made aware. Results of testing indeterminate of central versus peripheral vestibular impairment. Will continue formal testing during session tomorrow due to time constraints.  Patient in bed with her daughter in the room at end of session with breaks locked, bed alarm set, and all needs within reach.    Vestibular Assessment - 09/06/20 0001      Vestibular Assessment   General Observation Patient reports dizziness with mobility and changes in position. Nystagmus noted <45 sec when patient was lying down on 12/27.      Symptom Behavior   Type of Dizziness  Spinning;Unsteady with head/body turns;"World moves"    Frequency of Dizziness with lying, sitting up, standing, and turns    Duration of Dizziness 30-45 sec    Symptom Nature Positional;Motion provoked;Intermittent    Aggravating Factors Activity in general;Turning body quickly;Supine to sit;Sit to stand    Relieving Factors Head stationary;Lying supine;Slow movements;Closing eyes    Progression of Symptoms Better      Oculomotor Exam   Oculomotor Alignment Normal  Ocular ROM WFL    Spontaneous Absent    Gaze-induced  Age appropriate nystagmus at end range    Head shaking Horizontal Absent    Head Shaking Vertical Absent    Smooth Pursuits  Saccades    Saccades Poor trajectory;Undershoots      Positional Testing   Dix-Hallpike Dix-Hallpike Right;Dix-Hallpike Left    Horizontal Canal Testing Horizontal Canal Right;Horizontal Canal Left      Dix-Hallpike Right   Dix-Hallpike Right Duration patient reported symtpms <45 sec, no nystagmus    Dix-Hallpike Right Symptoms No nystagmus;Other (comment)   reported feeling dizzy     Dix-Hallpike Left   Dix-Hallpike Left Duration patient reported symtpms <45 sec, no nystagmus    Dix-Hallpike Left Symptoms No nystagmus;Other (comment)   reported feeling dizzy, less than on R     Horizontal Canal Right   Horizontal Canal Right Duration asymptomatic    Horizontal Canal Right Symptoms Normal      Horizontal Canal Left   Horizontal Canal Left Duration asymptomatic    Horizontal Canal Left Symptoms Normal      Cognition   Cognition Orientation Level Oriented x 4    Cognition Comment some STM deficits            Therapy Documentation Precautions:  Precautions Precautions: Fall Restrictions Weight Bearing Restrictions: No   Therapy/Group: Individual Therapy  Amy Bruce L Jaidyn Usery PT, DPT  09/06/2020, 5:26 PM

## 2020-09-06 NOTE — Progress Notes (Addendum)
Patient ID: Amy Bruce, female   DOB: 09-24-1950, 69 y.o.   MRN: [id number]  Spoke with [name]-daughter to inform team conference goals supervision level and target discharge date of 1/4. She reports she can not prvide 24/7 supervision. Encouraged her to discuss with her sister if she could and what the plan will be since both are out of state. Made aware pt's insurance would not cover SNF due to high level and came to rehab, has [employer]-medicare which may not be accepted in [place] and or [place]. [employer] to get back with this worker, after talking with sister.  2:48 Pm Met with daughter who reports the plan is for Mom to go to [facility] with her sister but she can not get up here until 1/8, to take her back to [facility]. Daughter looking into changing her insurance and calling back of pt's insurance card for options. Continue to work on discharge needs.

## 2020-09-06 NOTE — Progress Notes (Signed)
Speech Language Pathology Daily Session Note  Patient Details  Name: ALLAINA BROTZMAN MRN: [id number] Date of Birth: 1951-07-20  Today's Date: 09/06/2020 SLP Individual Time: 0830-0930 SLP Individual Time Calculation (min): 60 min  Short Term Goals: Week 1: SLP Short Term Goal 1 (Week 1): Pt will demonstrate sustained attention in 30 minute intervals with supervision A verbal cues for redirection. SLP Short Term Goal 2 (Week 1): Pt will complete mildly-complex functional problem solving tasks (ex: money/medication/time managament) with min A verbal cues. SLP Short Term Goal 3 (Week 1): Pt will self-monitor and self-correct functional errors with min A verbal cues. SLP Short Term Goal 4 (Week 1): Pt will recall daily information with min A verbal cues for compensatory strategies. SLP Short Term Goal 5 (Week 1): Pt will consume regular textures and thin liquid diet with Mod I use of swallow strategies.  Skilled Therapeutic Interventions: Skilled treatment session focused on cognitive goals. SLP facilitated session by providing extra time and overall supervision level verbal cues for functional problem solving during a complex calendar/scheduling task. Patient with intermittent decreased attention to task but redirected herself back to task with Mod I. Patient requested to use the bathroom and left on commode with daughter present and NT aware. Continue with current plan of care.      Pain No/Denies Pain   Therapy/Group: Individual Therapy  Manasi Dishon 09/06/2020, 10:03 AM

## 2020-09-06 NOTE — Progress Notes (Signed)
[place] PHYSICAL MEDICINE & REHABILITATION PROGRESS NOTE   Subjective/Complaints: Sleeping poorly at night- she does sleep poorly at home as well.  Had BM yesterday Headaches improved Has some dizziness standing with therapy.   ROS: Pt denies SOB, abd pain, CP, N/V/D, headaches improved  Objective:   No results found. No results for input(s): WBC, HGB, HCT, PLT in the last 72 hours. No results for input(s): NA, K, CL, CO2, GLUCOSE, BUN, CREATININE, CALCIUM in the last 72 hours.  Intake/Output Summary (Last 24 hours) at 09/06/2020 1006 Last data filed at 09/06/2020 0700 Gross per 24 hour  Intake 480 ml  Output --  Net 480 ml        Physical Exam: Vital Signs Blood pressure (!) 138/109, pulse 85, temperature 97.6 F (36.4 C), resp. rate 16, height 5' (1.524 m), weight 75.3 kg, SpO2 99 %. Gen: no distress, normal appearing HEENT: oral mucosa pink and moist, NCAT Cardio: Reg rate Chest: normal effort, normal rate of breathing Abd: soft, non-distended Ext: no edema Neuro:    Comments: RUE 5-/5 in biceps, triceps, WE< grip and finger abd LUE- 4+/5 in same muscles LEs- 5-5/ B/L in HF, KE, DF and PF  Skin:    Comments: Craniotomy site clean and dry with staples intact Lips really dry/cracked; no skin breakdown on buttocks or heels; 6 blisters only 1 still has fluid on L wrist/hand- other 5 are flat/popped/healing  Neurological:     Comments: Patient is alert sitting up in chair.  Mild left facial droop.  Makes eye contact with examiner.  She provides her name and age.  She knew the next holiday was Christmas.  Follows simple commands. Intact to light touch in all 4 extremities Slightly slurred speech/dysarthric, but mentation doing well- slightly impulsive.   Psychiatric: appropriate    Assessment/Plan: 1. Functional deficits which require 3+ hours per day of interdisciplinary therapy in a comprehensive inpatient rehab setting.  Physiatrist is providing close  team supervision and 24 hour management of active medical problems listed below.  Physiatrist and rehab team continue to assess barriers to discharge/monitor patient progress toward functional and medical goals  Care Tool:  Bathing    Body parts bathed by patient: Right arm,Left arm,Chest,Abdomen,Front perineal area,Right upper leg,Left upper leg,Face   Body parts bathed by helper: Buttocks,Right lower leg,Left lower leg     Bathing assist Assist Level: Moderate Assistance - Patient 50 - 74%     Upper Body Dressing/Undressing Upper body dressing   What is the patient wearing?: Pull over shirt    Upper body assist Assist Level: Supervision/Verbal cueing    Lower Body Dressing/Undressing Lower body dressing      What is the patient wearing?: Incontinence brief,Pants     Lower body assist Assist for lower body dressing: Maximal Assistance - Patient 25 - 49%     Toileting Toileting    Toileting assist Assist for toileting: Minimal Assistance - Patient > 75%     Transfers Chair/bed transfer  Transfers assist     Chair/bed transfer assist level: Contact Guard/Touching assist Chair/bed transfer assistive device: Geologist, engineering   Ambulation assist      Assist level: Minimal Assistance - Patient > 75% Assistive device: Walker-rolling Max distance: 10 ft   Walk 10 feet activity   Assist     Assist level: Minimal Assistance - Patient > 75% Assistive device: Walker-rolling   Walk 50 feet activity   Assist Walk 50 feet with 2 turns activity  did not occur: Safety/medical concerns  Assist level: Minimal Assistance - Patient > 75%      Walk 150 feet activity   Assist Walk 150 feet activity did not occur: Safety/medical concerns         Walk 10 feet on uneven surface  activity   Assist Walk 10 feet on uneven surfaces activity did not occur: Safety/medical concerns         Wheelchair     Assist Will patient use  wheelchair at discharge?: Yes Type of Wheelchair: Manual    Wheelchair assist level: Maximal Assistance - Patient 25 - 49% Max wheelchair distance: 150    Wheelchair 50 feet with 2 turns activity    Assist        Assist Level: Moderate Assistance - Patient 50 - 74%   Wheelchair 150 feet activity     Assist      Assist Level: Maximal Assistance - Patient 25 - 49%   Blood pressure (!) 138/109, pulse 85, temperature 97.6 F (36.4 C), resp. rate 16, height 5' (1.524 m), weight 75.3 kg, SpO2 99 %.     Medical Problem List and Plan: 1.  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  Status post right frontal parietal craniotomy evacuation of subdural hematoma 08/23/2020             -patient may  Shower if can cover craniotomy site with cap             -ELOS/Goals: 10-14 days- mod I to supervision  -Continue CIR  -Interdisciplinary Team Conference today  2.  Antithrombotics: -DVT/anticoagulation: SCDs             -antiplatelet therapy: N/A 3. Pain Management/fibromyalgia: Lyrica 100 mg twice daily, Elavil 50 mg nightly, hydrocodone as needed  12/27: increase topamax to 75mg    12/28: continue current dose which is helping.  Will try Lidocaine patch at night on R side of neck.  4. Mood: Provide emotional support             -antipsychotic agents: N/A 5. Neuropsych: This patient is capable of making decisions on her own behalf. 6. Skin/Wound Care: Routine skin checks- daily dressing changes on L hand/wrist 7. Fluids/Electrolytes/Nutrition: Routine in and outs with follow-up chemistries 8.  Seizure prophylaxis.  EEG negative.  Keppra 1000 mg twice daily 9.  Hypertension.  Norvasc 10 mg daily, Lopressor 25 mg twice daily.  Monitor with increased mobility Vitals:   09/05/20 2016 09/06/20 0451  BP: 140/80 (!) 138/109  Pulse: 84 85  Resp: 18 16  Temp: 99.1 F (37.3 C) 97.6 F (36.4 C)  SpO2: 100% 99%  controlled- continue current  regimen.  10.  Hyperlipidemia.  Lipitor 11.  History of PAD.  Patient on aspirin Plavix prior to admission.  Currently on hold due to SDH 12.  Tobacco abuse.  Counseling 13.  Morbid obesity.  BMI 32.68.  Dietary follow-up 14.  Urinary retention.  Urecholine 10 mg 3 times daily.  Check PVR 15.  Dysphagia.  Mechanical soft thin liquids.  Nasogastric tube had been in place for nutritional support with calorie counts and hope to remove soon- getting calorie count currently.  16. Daily headaches- since crani- adding Topamax 50 mg QHS for prevention-   12/24- will try lidocaine patch for R side of neck at night. Also added Robaxin 750 mg q6 hours prn for muscle tightness 17. Insomnia- took Elavil at home for this- will continue- daughter wants "  mother to not HAVE to take any medicine"- will con't home dose.  18. Constipation: schedule Miralax.  19. Insomnia: increase Amitriptyline to 75mg  HS 20. Dizziness with standing: TED ordered     LOS: 5 days A FACE TO FACE EVALUATION WAS PERFORMED  Jacqueline Spofford P Keymarion Bearman 09/06/2020, 10:06 AM

## 2020-09-06 NOTE — Progress Notes (Signed)
Occupational Therapy Session Note  Patient Details  Name: Amy Bruce MRN: [id number] Date of Birth: 1951/01/14  Today's Date: 09/06/2020 OT Individual Time: 0935-1005 OT Individual Time Calculation (min): 30 min    Short Term Goals: Week 1:  OT Short Term Goal 1 (Week 1): Pt will transfer with CGA consistently to get to toilet with LRAD OT Short Term Goal 2 (Week 1): Pt will don LB clothing wiht A only for standing balance OT Short Term Goal 3 (Week 1): Pt will compelte 2/3 components of toileting wiht MIN A for balance OT Short Term Goal 4 (Week 1): Pt will stand at sink to groom with MIN A to demo improved standing tolerance  Skilled Therapeutic Interventions/Progress Updates:    Pt received in wc with daughter in the room. Due to a short therapy session, pt agreeable to just working on LB self care. She had just toileted with nursing.  Pt taken to sink and was able to move at a fair pace today.   She completed sit to stand at the sink 3x with min -mod A.  Stood with min A as she cleansed perineal area and bottom.  Sat to wash LE with min A to reach feet. Min A to start pants over feet then pt stood with RW to pull pants over hips. During standing with RW, pt became very unbalanced, vacilating forward and back, so had her sit immediately. Asked pt if she felt dizzy and she said "oh I really do!" Rested for 2 minutes, and pt then said "I feel fine now" Resting in wc with belt alarm on and all needs met.   Therapy Documentation Precautions:  Precautions Precautions: Fall Restrictions Weight Bearing Restrictions: No    Pain:  No c/o pain     Therapy/Group: Individual Therapy  Worth 09/06/2020, 11:35 AM

## 2020-09-06 NOTE — Progress Notes (Signed)
Occupational Therapy Session Note  Patient Details  Name: Amy Bruce MRN: [id number] Date of Birth: 1951/09/10  Today's Date: 09/06/2020 OT Individual Time: 1130-1200 OT Individual Time Calculation (min): 30 min    Short Term Goals: Week 1:  OT Short Term Goal 1 (Week 1): Pt will transfer with CGA consistently to get to toilet with LRAD OT Short Term Goal 2 (Week 1): Pt will don LB clothing wiht A only for standing balance OT Short Term Goal 3 (Week 1): Pt will compelte 2/3 components of toileting wiht MIN A for balance OT Short Term Goal 4 (Week 1): Pt will stand at sink to groom with MIN A to demo improved standing tolerance  Skilled Therapeutic Interventions/Progress Updates:    Pt received sitting in w/c with her daughter present. Pt c/o nausea, she suspects from ensure. After further investigation, pt also reporting dizziness. BP obtained- 138/70. Discussed implications for this afternoons vestibular evaluation. Pt completed several gaze stabilizations exercises and actually reported some relief from dizziness. She did exhibit some nystagmus with head turn to the R, that subsided quickly on its own. Pt completed sit > stand after discussion re potentially wanting to use the bathroom. She stood with CGA and after a minute decided to sit back down. Discussed d/c recommendations, conference discussion, and CLOF with pt and her daughter. Pt was left sitting in the w/c with all needs met, chair alarm set.   Therapy Documentation Precautions:  Precautions Precautions: Fall Restrictions Weight Bearing Restrictions: No   Therapy/Group: Individual Therapy  Curtis Sites 09/06/2020, 6:53 AM

## 2020-09-07 ENCOUNTER — Inpatient Hospital Stay (HOSPITAL_COMMUNITY): Payer: Medicare Other

## 2020-09-07 ENCOUNTER — Inpatient Hospital Stay (HOSPITAL_COMMUNITY): Payer: Medicare Other | Admitting: Speech Pathology

## 2020-09-07 LAB — URINALYSIS, ROUTINE W REFLEX MICROSCOPIC
Bilirubin Urine: NEGATIVE
Glucose, UA: NEGATIVE mg/dL
Hgb urine dipstick: NEGATIVE
Ketones, ur: NEGATIVE mg/dL
Leukocytes,Ua: NEGATIVE
Nitrite: NEGATIVE
Protein, ur: NEGATIVE mg/dL
Specific Gravity, Urine: 1.014 (ref 1.005–1.030)
pH: 6 (ref 5.0–8.0)

## 2020-09-07 MED ORDER — DICLOFENAC SODIUM 1 % EX GEL
2.0000 g | Freq: Four times a day (QID) | CUTANEOUS | Status: DC
  Administered 2020-09-07 – 2020-09-16 (×22): 2 g via TOPICAL
  Filled 2020-09-07 (×2): qty 100

## 2020-09-07 NOTE — Progress Notes (Signed)
Pt stated that she has been having dizziness after laying down head of bed. Pt stated that the dizziness was not accompanied by and other s/sx, pt alert and oriented x4, vitals collected and WNL. PA Dan notified of pt s/sx and status. TED hose applied, and no new orders noted.

## 2020-09-07 NOTE — Plan of Care (Addendum)
Behavioral Plan   Rancho Level: VII  Behavior to decrease/ eliminate:   Reduce fall risk Dizziness   Changes to environment:   Lights on and blinds up during day to maintain daytime arousal Door open at all times  Interventions:  TED hose when out of bed Monitor for dizziness with mobility   Recommendations for interactions with patient:  Provide increased time for communication  May need redirection for attention to task    Attendees:   Feliberto Gottron, SLP Serina Cowper, PT Lavena Stanford, OT

## 2020-09-07 NOTE — Progress Notes (Signed)
[facility] PHYSICAL MEDICINE & REHABILITATION PROGRESS NOTE   Subjective/Complaints: UA negative Headache is better controlled and she slept better last night with increase in Amitriptyline dose. She does still feel some headache this morning and is due for her medication. C/o right thumb CMC joint pain.   ROS: Pt denies SOB, abd pain, CP, N/V/D, headaches improved  Objective:   No results found. No results for input(s): WBC, HGB, HCT, PLT in the last 72 hours. No results for input(s): NA, K, CL, CO2, GLUCOSE, BUN, CREATININE, CALCIUM in the last 72 hours.  Intake/Output Summary (Last 24 hours) at 09/07/2020 0851 Last data filed at 09/07/2020 0700 Gross per 24 hour  Intake 240 ml  Output 100 ml  Net 140 ml        Physical Exam: Vital Signs Blood pressure 126/87, pulse 65, temperature 97.6 F (36.4 C), resp. rate 16, height 5' (1.524 m), weight 75.3 kg, SpO2 99 %. Gen: no distress, normal appearing HEENT: oral mucosa pink and moist, NCAT Cardio: Reg rate Chest: normal effort, normal rate of breathing Abd: soft, non-distended Ext: no edema Skin: intact Neuro:    Comments: RUE 5-/5 in biceps, triceps, WE< grip and finger abd LUE- 4+/5 in same muscles LEs- 5-5/ B/L in HF, KE, DF and PF  Skin:    Comments: Craniotomy site clean and dry with staples intact Lips really dry/cracked; no skin breakdown on buttocks or heels; 6 blisters only 1 still has fluid on L wrist/hand- other 5 are flat/popped/healing  Neurological:     Comments: Patient is alert sitting up in chair.  Mild left facial droop.  Makes eye contact with examiner.  She provides her name and age.  She knew the next holiday was Christmas.  Follows simple commands. Intact to light touch in all 4 extremities Slightly slurred speech/dysarthric, but mentation doing well- slightly impulsive.   Psychiatric: appropriate    Assessment/Plan: 1. Functional deficits which require 3+ hours per day of interdisciplinary  therapy in a comprehensive inpatient rehab setting.  Physiatrist is providing close team supervision and 24 hour management of active medical problems listed below.  Physiatrist and rehab team continue to assess barriers to discharge/monitor patient progress toward functional and medical goals  Care Tool:  Bathing    Body parts bathed by patient: Right arm,Left arm,Chest,Abdomen,Front perineal area,Right upper leg,Left upper leg,Face   Body parts bathed by helper: Buttocks,Right lower leg,Left lower leg     Bathing assist Assist Level: Moderate Assistance - Patient 50 - 74%     Upper Body Dressing/Undressing Upper body dressing   What is the patient wearing?: Pull over shirt    Upper body assist Assist Level: Supervision/Verbal cueing    Lower Body Dressing/Undressing Lower body dressing      What is the patient wearing?: Incontinence brief,Pants     Lower body assist Assist for lower body dressing: Maximal Assistance - Patient 25 - 49%     Toileting Toileting    Toileting assist Assist for toileting: Minimal Assistance - Patient > 75%     Transfers Chair/bed transfer  Transfers assist     Chair/bed transfer assist level: Contact Guard/Touching assist Chair/bed transfer assistive device: Geologist, engineering   Ambulation assist      Assist level: Contact Guard/Touching assist Assistive device: Walker-rolling Max distance: 10 ft   Walk 10 feet activity   Assist     Assist level: Contact Guard/Touching assist Assistive device: Walker-rolling   Walk 50 feet activity  Assist Walk 50 feet with 2 turns activity did not occur: Safety/medical concerns  Assist level: Minimal Assistance - Patient > 75%      Walk 150 feet activity   Assist Walk 150 feet activity did not occur: Safety/medical concerns         Walk 10 feet on uneven surface  activity   Assist Walk 10 feet on uneven surfaces activity did not occur:  Safety/medical concerns         Wheelchair     Assist Will patient use wheelchair at discharge?: Yes Type of Wheelchair: Manual    Wheelchair assist level: Maximal Assistance - Patient 25 - 49% Max wheelchair distance: 150    Wheelchair 50 feet with 2 turns activity    Assist        Assist Level: Moderate Assistance - Patient 50 - 74%   Wheelchair 150 feet activity     Assist      Assist Level: Maximal Assistance - Patient 25 - 49%   Blood pressure 126/87, pulse 65, temperature 97.6 F (36.4 C), resp. rate 16, height 5' (1.524 m), weight 75.3 kg, SpO2 99 %.     Medical Problem List and Plan: 1.  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  Antithrombotics: -DVT/anticoagulation: SCDs             -antiplatelet therapy: N/A 3. Pain Management/fibromyalgia: Lyrica 100 mg twice daily, Elavil 50 mg nightly, hydrocodone as needed  12/27: increase topamax to 75mg    12/28: continue current dose which is helping.   12/29: headaches are much improved with increase in Amitriptyline dose. Still present so continue current medications.  Will try Lidocaine patch at night on R side of neck.  4. Mood: Provide emotional support             -antipsychotic agents: N/A 5. Neuropsych: This patient is capable of making decisions on her own behalf. 6. Skin/Wound Care: Routine skin checks- daily dressing changes on L hand/wrist. May d/c staples 12/29. 7. Fluids/Electrolytes/Nutrition: Routine in and outs with follow-up chemistries 8.  Seizure prophylaxis.  EEG negative.  Keppra 1000 mg twice daily 9.  Hypertension.  Norvasc 10 mg daily, Lopressor 25 mg twice daily.  Monitor with increased mobility Vitals:    09/07/20 0441 09/07/20 0809  BP: 111/66 126/87  Pulse: 75 65  Resp: 16   Temp: 97.6 F (36.4 C)   SpO2: 99%   controlled- continue current regimen.  10.  Hyperlipidemia.  Lipitor 11.  History of PAD.  Patient on aspirin Plavix prior to admission.  Currently on hold due to SDH 12.  Tobacco abuse.  Counseling 13.  Morbid obesity.  BMI 32.68.  Dietary follow-up 14.  Urinary retention.  Urecholine 10 mg 3 times daily.  Check PVR 15.  Dysphagia.  Mechanical soft thin liquids.  Nasogastric tube had been in place for nutritional support with calorie counts and hope to remove soon- getting calorie count currently.  16. Daily headaches- since crani- adding Topamax 50 mg QHS for prevention-   12/24- will try lidocaine patch for R side of neck at night. Also added Robaxin 750  mg q6 hours prn for muscle tightness 17. Insomnia- took Elavil at home for this- will continue- daughter wants "mother to not HAVE to take any medicine"- will con't home dose.  18. Constipation: schedule Miralax.  19. Insomnia: increase Amitriptyline to 75mg  HS 20. Dizziness with standing: TED ordered. Orthostatic eval today 21. Right thumb CMC joint pain: XR and Diclofenac gel ordered.      LOS: 6 days A FACE TO FACE EVALUATION WAS PERFORMED  Clide Deutscher Tyress Loden 09/07/2020, 8:51 AM

## 2020-09-07 NOTE — Progress Notes (Signed)
Physical Therapy Session Note  Patient Details  Name: Amy Bruce MRN: [id number] Date of Birth: [birth year]  Today's Date: 09/07/2020 PT Individual Time: 0810-0905 PT Individual Time Calculation (min): 55 min   Short Term Goals: Week 1:  PT Short Term Goal 1 (Week 1): pt to demonstrate supine<>sit CGA with limited use of bedrails PT Short Term Goal 2 (Week 1): pt to demonstrate functional transfers with LRAD at min A consistently PT Short Term Goal 3 (Week 1): pt to demonstrate gait training with LRAD at 100' min A  Skilled Therapeutic Interventions/Progress Updates:     Patient in bed with RN providing morning medications and discussing medications with patient upon PT arrival. Patient alert and agreeable to PT session. Patient denied pain during session. Provided patient with yogurt and ensure due to poor intake from breakfast.   Vitals:  Supine: BP 151/80, HR 79 (asymptomatic) Sitting: BP 99/74, HR 80 (asymptomatic) Standing: BP 97/69, HR 88 (symptomatic) Sitting after standing: BP 126/60, HR 80 (asymptomatic) RN made aware, recommended use of TED hose, RN in agreement.   Therapeutic Activity: Bed Mobility: Patient performed supine to/from sit with supervision with HOB elevated due to increased dizziness in lying flat. Patient sat EOB with supervision to eat yogurt, provided cues for L tongue sweep to prevent pocketing, and discussed results from yesterday's vestibular evaluation. MD rounded during, assisted patient in reporting R thumb MCP pain and results of vestibular evaluation.  Transfers: Patient performed sit to/from stand x2 and stand pivot x1 with CGA using RW. Provided verbal cues for hand placement and forward weight shift.  Gait Training:  Patient ambulated 102 feet using RW with CGA and w/c follow for safety due to decreased activity tolerance and hx of dizziness. Ambulated with decreased gait speed, decreased step length and height, increased B hip and knee flexion  in stance, forward trunk lean, and downward head gaze. Provided verbal cues for proximity to RW, looking ahead, increased step length and height, and decreased speed on turns to reduced dizziness. Patient denied dizziness throughout, distance limited by fatigue.   Neuromuscular Re-ed: Patient performed the following VOR activities: -in sitting reading 1 line of letters at 3 ft away with horizontal head turns x30 sec before onset of 4/10 dizziness x3  Patient in w/c in the room at end of session with breaks locked, seat blet alarm set, and all needs within reach.    Therapy Documentation Precautions:  Precautions Precautions: Fall Restrictions Weight Bearing Restrictions: No   Therapy/Group: Individual Therapy  Biridiana Twardowski L Cloie Wooden PT, DPT  09/07/2020, 4:07 PM

## 2020-09-07 NOTE — Progress Notes (Signed)
Nutrition Follow-up  DOCUMENTATION CODES:   Obesity unspecified  INTERVENTION:   - Continue Ensure Enlive po TID, each supplement provides 350 kcal and 20 grams of protein  - Continue MVI with minerals daily  - Encourage adequate PO intake  NUTRITION DIAGNOSIS:   Inadequate oral intake related to decreased appetite as evidenced by per patient/family report.  Progressing  GOAL:   Patient will meet greater than or equal to 90% of their needs  Progressing  MONITOR:   PO intake,Supplement acceptance,Diet advancement,Labs,Weight trends,Skin,I & O's  REASON FOR ASSESSMENT:   Consult Enteral/tube feeding initiation and management  ASSESSMENT:   Amy Bruce is a 69 year old right-handed female with history of anxiety, cervical fusion 04/2020, obesity with BMI 32.68, prediabetes, fibromyalgia, hyperlipidemia, hypertension, PAD maintained on aspirin and Plavix as well as history of tobacco use.  Per chart review patient lives alone in [place].  Reportedly independent prior to admission.  She plans to stay with her daughter in [place] on discharge.  Presented 08/23/2020 after reported fall outside of her doctor's office when she lost her balance.  No reported loss of consciousness.  Cranial CT scan showed acute right cerebral convexity greater than falcine subdural hematoma.  12 mm leftward midline shift.  No definite ventricle trapping.    CT cervical spine negative for acute fracture or subluxation.  CT abdomen pelvis no evidence of acute traumatic injury.  Admission chemistries urinalysis negative nitrite urine screen negative, chemistries unremarkable aside calcium 8.6 WBC 10,800.  Follow-up neurosurgery for SDH underwent right frontal parietal craniotomy for evacuation of subdural hematoma 08/23/2020 per Dr. Conchita Paris.  EEG negative for seizure.  Maintained on Keppra for seizure prophylaxis.  Currently on a mechanical soft thin liquid diet as well as a nasogastric tube had been  in place for nutritional support.  Bouts of urinary retention placed on low-dose Urecholine.  Due to patient's decrease in functional ability altered mental status she was admitted for an inpatient rehab comprehensive therapy course.  12/23 - Cortrak removed 12/24 - diet upgraded to regular  Unable to reach pt via phone call to room. Per [facility] documentation, pt accepting >50% of Ensure Enlive supplements when offered. No new weights since admission. Will continue with current oral nutrition supplement regimen and MVI.  Meal Completion: 25-100% (averaging 50%)  Medications reviewed and include: Ensure Enlive TID, MVI with minerals, protonix, miralax, senna  Labs reviewed.  Diet Order:   Diet Order            Diet regular Room service appropriate? Yes; Fluid consistency: Thin  Diet effective now                 EDUCATION NEEDS:   Education needs have been addressed  Skin:  Skin Assessment:  Skin Integrity Issues: Incisions: closed left neck and right head  Last BM:  09/05/20 small type 4  Height:   Ht Readings from Last 1 Encounters:  09/01/20 5' (1.524 m)    Weight:   Wt Readings from Last 1 Encounters:  09/01/20 75.3 kg    Ideal Body Weight:  45.5 kg  BMI:  Body mass index is 32.4 kg/m.  Estimated Nutritional Needs:   Kcal:  1700-1900  Protein:  85-100 grams  Fluid:  > 1.7 L    Mertie Clause, MS, RD, LDN Inpatient Clinical Dietitian Please see AMiON for contact information.

## 2020-09-07 NOTE — Progress Notes (Signed)
Occupational Therapy Session Note  Patient Details  Name: Amy Bruce MRN: [id number] Date of Birth: 03/17/1951  Today's Date: 09/07/2020 OT Individual Time: 1400-1515 OT Individual Time Calculation (min): 75 min    Short Term Goals: Week 1:  OT Short Term Goal 1 (Week 1): Pt will transfer with CGA consistently to get to toilet with LRAD OT Short Term Goal 2 (Week 1): Pt will don LB clothing wiht A only for standing balance OT Short Term Goal 3 (Week 1): Pt will compelte 2/3 components of toileting wiht MIN A for balance OT Short Term Goal 4 (Week 1): Pt will stand at sink to groom with MIN A to demo improved standing tolerance  Skilled Therapeutic Interventions/Progress Updates:    1:1. Pt received in bed agreeable to shower after encouragement with education on seated performance for safety. Pt completes ambulatory transfer to/from bathroom with CGA overall. Pt completes all BADLs and transfers with CGA and mod VC for redirection to task at shoewr level for bathing and w/c at sink for dressing. Pt often tangential and requires VC for attention to current task as pt often lost in listing off needs for daughter to bring. Pt able to sequence bathing body parts with MIN VC. Education on use of sock aide with MOD A Overall. Pt able to don B socks with demo and VC for use. Exited session with pt seated in bed, exit alarm on and call light in reach   Therapy Documentation Precautions:  Precautions Precautions: Fall Restrictions Weight Bearing Restrictions: No General:   Vital Signs: Therapy Vitals Temp: 97.6 F (36.4 C) Pulse Rate: 75 Resp: 16 BP: 111/66 Patient Position (if appropriate): Lying Oxygen Therapy SpO2: 99 % O2 Device: Room Air Pain:   ADL: ADL Grooming: Supervision/safety Where Assessed-Grooming: Edge of bed Upper Body Bathing: Supervision/safety Where Assessed-Upper Body Bathing: Edge of bed Lower Body Bathing: Moderate assistance (BSC) Where  Assessed-Lower Body Bathing: Other (Comment) Upper Body Dressing: Supervision/safety Where Assessed-Upper Body Dressing: Edge of bed Lower Body Dressing: Maximal assistance Where Assessed-Lower Body Dressing: Other (Comment) (BSC) Toilet Transfer: Minimal assistance Toilet Transfer Method: Stand pivot Toilet Transfer Equipment: Bedside commode (RW) Vision   Perception    Praxis   Exercises:   Other Treatments:     Therapy/Group: Individual Therapy  Shon Hale 09/07/2020, 6:53 AM

## 2020-09-07 NOTE — Progress Notes (Signed)
Routine Urine and culture specimen sent to lab per order

## 2020-09-07 NOTE — Progress Notes (Signed)
Speech Language Pathology Daily Session Note  Patient Details  Name: Amy Bruce MRN: [id number] Date of Birth: Jun 28, 1951  Today's Date: 09/07/2020 SLP Individual Time: 1000-1100 SLP Individual Time Calculation (min): 60 min  Short Term Goals: Week 1: SLP Short Term Goal 1 (Week 1): Pt will demonstrate sustained attention in 30 minute intervals with supervision A verbal cues for redirection. SLP Short Term Goal 2 (Week 1): Pt will complete mildly-complex functional problem solving tasks (ex: money/medication/time managament) with min A verbal cues. SLP Short Term Goal 3 (Week 1): Pt will self-monitor and self-correct functional errors with min A verbal cues. SLP Short Term Goal 4 (Week 1): Pt will recall daily information with min A verbal cues for compensatory strategies. SLP Short Term Goal 5 (Week 1): Pt will consume regular textures and thin liquid diet with Mod I use of swallow strategies.  Skilled Therapeutic Interventions: Skilled treatment session focused on cognitive goals. SLP facilitated session by providing Mod A verbal cues for recall of her current medications and their functions. Mod A verbal cues were also needed for sustained attention and functional problem solving during a complex medication management task. The medication task was unable to be completed this session, therefore, it will be completed during next session. Patient transferred back to bed at end of session and left with alarm on and all needs within reach. Continue with current plan of care.      Pain No/Denies Pain   Therapy/Group: Individual Therapy  Minnie Legros 09/07/2020, 3:22 PM

## 2020-09-08 ENCOUNTER — Inpatient Hospital Stay (HOSPITAL_COMMUNITY): Payer: Medicare Other | Admitting: Physical Therapy

## 2020-09-08 ENCOUNTER — Inpatient Hospital Stay (HOSPITAL_COMMUNITY): Payer: Medicare Other

## 2020-09-08 ENCOUNTER — Inpatient Hospital Stay (HOSPITAL_COMMUNITY): Payer: Medicare Other | Admitting: Speech Pathology

## 2020-09-08 LAB — URINE CULTURE

## 2020-09-08 MED ORDER — MECLIZINE HCL 25 MG PO TABS
12.5000 mg | ORAL_TABLET | Freq: Two times a day (BID) | ORAL | Status: DC | PRN
  Administered 2020-09-08 – 2020-09-16 (×3): 12.5 mg via ORAL
  Filled 2020-09-08 (×3): qty 1

## 2020-09-08 NOTE — Progress Notes (Signed)
Speech Language Pathology Weekly Progress and Session Note  Patient Details  Name: Amy Bruce MRN: [id number] Date of Birth: Nov 09, 1950  Beginning of progress report period: September 02, 2020 End of progress report period: September 08, 2020  Today's Date: 09/08/2020 SLP Individual Time: 1157-2620 SLP Individual Time Calculation (min): 25 min  Short Term Goals: Week 1: SLP Short Term Goal 1 (Week 1): Pt will demonstrate sustained attention in 30 minute intervals with supervision A verbal cues for redirection. SLP Short Term Goal 1 - Progress (Week 1): Met SLP Short Term Goal 2 (Week 1): Pt will complete mildly-complex functional problem solving tasks (ex: money/medication/time managament) with min A verbal cues. SLP Short Term Goal 2 - Progress (Week 1): Met SLP Short Term Goal 3 (Week 1): Pt will self-monitor and self-correct functional errors with min A verbal cues. SLP Short Term Goal 3 - Progress (Week 1): Met SLP Short Term Goal 4 (Week 1): Pt will recall daily information with min A verbal cues for compensatory strategies. SLP Short Term Goal 4 - Progress (Week 1): Met SLP Short Term Goal 5 (Week 1): Pt will consume regular textures and thin liquid diet with Mod I use of swallow strategies. SLP Short Term Goal 5 - Progress (Week 1): Met    New Short Term Goals: Week 2: SLP Short Term Goal 1 (Week 2): STGs=LTGs due to ELOS  Weekly Progress Updates: Patient has made functional gains and has met 5 of 5 STGs this reporting period. Currently, patient demonstrates behaviors consistent with a Rancho Level VII and requires overall Min A verbal cues to complete functional and mildly complex tasks safely in regards to problem solving, recall, error awareness and attention. Patient is also consuming regular textures with thin liquids without overt s/s of aspiration and is overall Mod I for use of swallowing compensatory strategies. Patient and family education is ongoing. Patient would  benefit from continued skilled SLP intervention to maximize her cognitive functioning and overall functional independence prior to discharge.     Intensity: Minumum of 1-2 x/day, 30 to 90 minutes Frequency: 3 to 5 out of 7 days Duration/Length of Stay: 09/17/20 Treatment/Interventions: Cognitive remediation/compensation;Cueing hierarchy;Dysphagia/aspiration precaution training;Functional tasks;Medication managment;Patient/family education;Internal/external aids   Daily Session  Skilled Therapeutic Interventions: Skilled treatment session focused on cognitive and dysphagia goals. Upon arrival, patient was asleep in bed and required more than a reasonable amount of time for arousal. Patient eventually sat EOB with extra time in order to consume her breakfast meal of regular textures with thin liquids. Patient declined her tray but requested to finish her tuna sandwich from the patient fridge. Patient consumed meal without overt s/s of aspiration but required extra time for mastication. Recommend patient continue current diet. Patient demonstrated selective attention to self-feeding for ~20 minutes with Mod I. Patient left sitting EOB to finish her banana with alarm on and all needs within reach. Continue with current plan of care.      Pain No/Denies Pain   Therapy/Group: Individual Therapy  Tiffini Blacksher 09/08/2020, 12:36 PM

## 2020-09-08 NOTE — Plan of Care (Signed)
  Problem: Consults Goal: RH BRAIN INJURY PATIENT EDUCATION Description: Description: See Patient Education module for eduction specifics Outcome: Progressing Goal: Skin Care Protocol Initiated - if Braden Score 18 or less Description: If consults are not indicated, leave blank or document N/A Outcome: Progressing   Problem: RH BOWEL ELIMINATION Goal: RH STG MANAGE BOWEL WITH ASSISTANCE Description: STG Manage Bowel with supervision Assistance. Outcome: Progressing Goal: RH STG MANAGE BOWEL W/MEDICATION W/ASSISTANCE Description: STG Manage Bowel with Medication with Assistance. Outcome: Progressing   Problem: RH BLADDER ELIMINATION Goal: RH STG MANAGE BLADDER WITH ASSISTANCE Description: STG Manage Bladder With supervision Assistance Outcome: Progressing   Problem: RH SKIN INTEGRITY Goal: RH STG SKIN FREE OF INFECTION/BREAKDOWN Description: Patient's skin will be free of infections and breakdown with minimal assistance Outcome: Progressing Goal: RH STG MAINTAIN SKIN INTEGRITY WITH ASSISTANCE Description: STG Maintain Skin Integrity With minimal Assistance. Outcome: Progressing   Problem: RH SAFETY Goal: RH STG ADHERE TO SAFETY PRECAUTIONS W/ASSISTANCE/DEVICE Description: STG Adhere to Safety Precautions With minimal Assistance/Device. Outcome: Progressing   Problem: RH COGNITION-NURSING Goal: RH STG USES MEMORY AIDS/STRATEGIES W/ASSIST TO PROBLEM SOLVE Description: STG Uses Memory Aids/Strategies With minimal Assistance to Problem Solve. Outcome: Progressing   Problem: RH PAIN MANAGEMENT Goal: RH STG PAIN MANAGED AT OR BELOW PT'S PAIN GOAL Description: Pain will be managed at or below 4 out of 10 on pain scale with minimal assist Outcome: Progressing   Problem: RH KNOWLEDGE DEFICIT BRAIN INJURY Goal: RH STG INCREASE KNOWLEDGE OF SELF CARE AFTER BRAIN INJURY Description: Patient/family will increase knowledge of self care after brain injury with handouts and additional  education from nursing and therapies with minimal assistance Outcome: Progressing   

## 2020-09-08 NOTE — Progress Notes (Signed)
Occupational Therapy Session Note  Patient Details  Name: Amy Bruce MRN: [id number] Date of Birth: Mar 25, 1951  Today's Date: 09/08/2020 OT Individual Time: [phone] OT Individual Time Calculation (min): 59 min    Short Term Goals: Week 1:  OT Short Term Goal 1 (Week 1): Pt will transfer with CGA consistently to get to toilet with LRAD OT Short Term Goal 2 (Week 1): Pt will don LB clothing wiht A only for standing balance OT Short Term Goal 3 (Week 1): Pt will compelte 2/3 components of toileting wiht MIN A for balance OT Short Term Goal 4 (Week 1): Pt will stand at sink to groom with MIN A to demo improved standing tolerance  Skilled Therapeutic Interventions/Progress Updates:    1:1. Pt received in bed agreeable to OT. Offered shower as pt enjoyed yesterday. Pt getting sutures removed and agreeable to attempt to wash hair and detangle mats in back of head. Pt bathes with A to wash back and B feet. Significant time spent detaingling hair in shower with total A to maintain safety around incision. Mats able to be detangled. Pt very pleased. CGA for transfer with RW into and out of bathroom with VC for proximity to RW. Pt completes UB dressing with S and dons brief with A to pull up in back. Exited session with pt seated in bed, exit alarm on and call light in reach   Therapy Documentation Precautions:  Precautions Precautions: Fall Restrictions Weight Bearing Restrictions: No General:   Vital Signs: Therapy Vitals Temp: 97.7 F (36.5 C) Temp Source: Oral Pulse Rate: 91 Resp: 16 BP: (!) 152/77 Patient Position (if appropriate): Lying Oxygen Therapy SpO2: 100 % O2 Device: Room Air Pain: Pain Assessment Pain Scale: 0-10 Pain Score: Asleep Pain Location: Head ADL: ADL Grooming: Supervision/safety Where Assessed-Grooming: Edge of bed Upper Body Bathing: Supervision/safety Where Assessed-Upper Body Bathing: Edge of bed Lower Body Bathing: Moderate assistance  (BSC) Where Assessed-Lower Body Bathing: Other (Comment) Upper Body Dressing: Supervision/safety Where Assessed-Upper Body Dressing: Edge of bed Lower Body Dressing: Maximal assistance Where Assessed-Lower Body Dressing: Other (Comment) (BSC) Toilet Transfer: Minimal assistance Toilet Transfer Method: Stand pivot Toilet Transfer Equipment: Bedside commode (RW) Vision   Perception    Praxis   Exercises:   Other Treatments:     Therapy/Group: Individual Therapy  Shon Hale 09/08/2020, 6:40 AM

## 2020-09-08 NOTE — Progress Notes (Signed)
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 Thumb pain has improved with voltaren gel. Headache improved but still present.  Staples can be removed today.  Still having dizziness and rest and with positional changes.   ROS: Pt denies SOB, abd pain, CP, N/V/D, headaches improved  Objective:   DG Hand 2 View Right  Result Date: 09/07/2020 CLINICAL DATA:  Right thumb pain at the metacarpal phalangeal and interphalangeal joints with bruising. Possible injury. EXAM: RIGHT HAND - 2 VIEW COMPARISON:  Radiographs 08/23/2020. FINDINGS: The mineralization and alignment are normal. There is no evidence of acute fracture or dislocation. There are stable mild degenerative changes at the interphalangeal, metacarpal phalangeal and 1st carpometacarpal articulations. No erosive changes or focal soft tissue abnormalities are identified. IMPRESSION: No acute findings. Stable mild degenerative changes. Electronically Signed   By: Richardean Sale M.D.   On: 09/07/2020 11:56   No results for input(s): WBC, HGB, HCT, PLT in the last 72 hours. No results for input(s): NA, K, CL, CO2, GLUCOSE, BUN, CREATININE, CALCIUM in the last 72 hours.  Intake/Output Summary (Last 24 hours) at 09/08/2020 1258 Last data filed at 09/08/2020 0827 Gross per 24 hour  Intake 480 ml  Output --  Net 480 ml        Physical Exam: Vital Signs Blood pressure (!) 145/78, pulse 78, temperature 97.7 F (36.5 C), temperature source Oral, resp. rate 16, height 5' (1.524 m), weight 75.3 kg, SpO2 100 %. Gen: no distress, normal appearing HEENT: oral mucosa pink and moist, NCAT Cardio: Reg rate Chest: normal effort, normal rate of breathing Abd: soft, non-distended Ext: no edema Neuro:    Comments: RUE 5-/5 in biceps, triceps, WE< grip and finger abd LUE- 4+/5 in same muscles LEs- 5-5/ B/L in HF, KE, DF and PF  Skin:    Comments: Craniotomy  site clean and dry with staples intact Lips really dry/cracked; no skin breakdown on buttocks or heels; 6 blisters only 1 still has fluid on L wrist/hand- other 5 are flat/popped/healing  Neurological:     Comments: Patient is alert sitting up in chair.  Mild left facial droop.  Makes eye contact with examiner.  She provides her name and age.  She knew the next holiday was Christmas.  Follows simple commands. Intact to light touch in all 4 extremities Slightly slurred speech/dysarthric, but mentation doing well- slightly impulsive.   Psychiatric: appropriate    Assessment/Plan: 1. Functional deficits which require 3+ hours per day of interdisciplinary therapy in a comprehensive inpatient rehab setting.  Physiatrist is providing close team supervision and 24 hour management of active medical problems listed below.  Physiatrist and rehab team continue to assess barriers to discharge/monitor patient progress toward functional and medical goals  Care Tool:  Bathing    Body parts bathed by patient: Right arm,Left arm,Chest,Abdomen,Front perineal area,Right upper leg,Left upper leg,Face,Buttocks   Body parts bathed by helper: Right lower leg,Left lower leg     Bathing assist Assist Level: Minimal Assistance - Patient > 75%     Upper Body Dressing/Undressing Upper body dressing   What is the patient wearing?: Pull over shirt    Upper body assist Assist Level: Supervision/Verbal cueing    Lower Body Dressing/Undressing Lower body dressing      What is the patient wearing?: Incontinence brief,Pants     Lower body assist Assist for lower body dressing: Moderate Assistance - Patient 50 - 74%  Toileting Toileting    Toileting assist Assist for toileting: Minimal Assistance - Patient > 75%     Transfers Chair/bed transfer  Transfers assist     Chair/bed transfer assist level: Contact Guard/Touching assist Chair/bed transfer assistive device: Arboriculturist   Ambulation assist      Assist level: Contact Guard/Touching assist Assistive device: Walker-rolling Max distance: 10 ft   Walk 10 feet activity   Assist     Assist level: Contact Guard/Touching assist Assistive device: Walker-rolling   Walk 50 feet activity   Assist Walk 50 feet with 2 turns activity did not occur: Safety/medical concerns  Assist level: Minimal Assistance - Patient > 75%      Walk 150 feet activity   Assist Walk 150 feet activity did not occur: Safety/medical concerns         Walk 10 feet on uneven surface  activity   Assist Walk 10 feet on uneven surfaces activity did not occur: Safety/medical concerns         Wheelchair     Assist Will patient use wheelchair at discharge?: Yes Type of Wheelchair: Manual    Wheelchair assist level: Maximal Assistance - Patient 25 - 49% Max wheelchair distance: 150    Wheelchair 50 feet with 2 turns activity    Assist        Assist Level: Moderate Assistance - Patient 50 - 74%   Wheelchair 150 feet activity     Assist      Assist Level: Maximal Assistance - Patient 25 - 49%   Blood pressure (!) 145/78, pulse 78, temperature 97.7 F (36.5 C), temperature source Oral, resp. rate 16, height 5' (1.524 m), weight 75.3 kg, SpO2 100 %.     Medical Problem List and Plan: 1.  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  Antithrombotics: -DVT/anticoagulation: SCDs             -antiplatelet therapy: N/A 3. Pain Management/fibromyalgia: Lyrica 100 mg twice daily, Elavil 50 mg nightly, hydrocodone as needed  12/27: increase topamax to 75mg    12/28: continue current dose which is helping.    12/29-30: headaches are much improved with increase in Amitriptyline dose. Still present so continue current medications.  Will try Lidocaine patch at night on R side of neck.  4. Mood: Provide emotional support             -antipsychotic agents: N/A 5. Neuropsych: This patient is capable of making decisions on her own behalf. 6. Skin/Wound Care: Routine skin checks- daily dressing changes on L hand/wrist. May d/c staples 12/29. 7. Fluids/Electrolytes/Nutrition: Routine in and outs with follow-up chemistries 8.  Seizure prophylaxis.  EEG negative.  Keppra 1000 mg twice daily 9.  Hypertension.  Norvasc 10 mg daily, Lopressor 25 mg twice daily.  Monitor with increased mobility Vitals:   09/08/20 0302 09/08/20 0836  BP: (!) 152/77 (!) 145/78  Pulse: 91 78  Resp: 16   Temp: 97.7 F (36.5 C)   SpO2: 100%   Bp elevated today but was well controlled prior to this so continue to monitor TID.  10.  Hyperlipidemia.  Lipitor 11.  History of PAD.  Patient on aspirin Plavix prior to admission.  Currently on hold due to SDH 12.  Tobacco abuse.  Counseling 13.  Morbid obesity.  BMI 32.68.  Dietary follow-up 14.  Urinary retention.  Urecholine 10 mg 3 times daily.  Check PVR 15.  Dysphagia.  Mechanical soft thin liquids.  Nasogastric tube had been in place for nutritional support with calorie counts and hope to remove soon- getting calorie count currently.  16. Daily headaches- since crani- adding Topamax 50 mg QHS for prevention-   12/24- will try lidocaine patch for R side of neck at night. Also added Robaxin 750 mg q6 hours prn for muscle tightness 17. Insomnia- took Elavil at home for this- will continue- daughter wants "mother to not HAVE to take any medicine"- will con't home dose.  18. Constipation: schedule Miralax.  19. Insomnia: increase Amitriptyline to 75mg  HS 20. Dizziness with standing: TED ordered. Orthostatic eval today 21. Right thumb CMC joint pain: XR and Diclofenac gel ordered.  XR shows mild arthritic changes. Diclofenac gel appears to have helped.      LOS: 7 days A FACE TO FACE EVALUATION WAS PERFORMED  Lillias Difrancesco 09/08/2020, 12:58 PM

## 2020-09-08 NOTE — Progress Notes (Signed)
Physical Therapy Session Note  Patient Details  Name: Amy Bruce MRN: [id number] Date of Birth: May 23, 1951  Today's Date: 09/08/2020 PT Individual Time: 1500-1600 PT Individual Time Calculation (min): 60 min   Short Term Goals: Week 1:  PT Short Term Goal 1 (Week 1): pt to demonstrate supine<>sit CGA with limited use of bedrails PT Short Term Goal 2 (Week 1): pt to demonstrate functional transfers with LRAD at min A consistently PT Short Term Goal 3 (Week 1): pt to demonstrate gait training with LRAD at 100' min A Week 2:     Skilled Therapeutic Interventions/Progress Updates:  Session 1  Pt received supine in bed and agreeable to PT. Supine>sit transfer with  assist and supervision assist and min cues for UE support on bed rails.Pt's daughter assaisted with lpwer body dressing and max assist for time management. Sit<>stand from EOB with supervision assist and RW. Throughout session sit<>stand transfers performed with CGA-supervision assist with min cues for initiation.   Gait training with RW 2 x 57f with CGA and 1175fwith min assist. Cues for AD management in turns and improved step width to improve safety.   Patient demonstrates increased fall risk as noted by score of   25/56 on Berg Balance Scale.  (<36= high risk for falls, close to 100%; 37-45 significant >80%; 46-51 moderate >50%; 52-55 lower >25%)  PT instructed pt in TUG: 87 sec (average of 3 trials; >13.5 sec indicates increased fall risk)  Pt performed 5xSTS: 21 sec (>15 sec indicates increased fall risk) CGA from PT with cues for UE support on RW.  Pt returned to room and performed stand pivot transfer to bed with CGA and RW. Sit>supine completed with supervision assist, and left supine in bed with call bell in reach and all needs met.    Session 2.  Pt received supine in bed, asleep. Pt refused additional therapy due to fatigue. Pt left supine in bed with call bell in reach and all needs met.         Therapy  Documentation Precautions:  Precautions Precautions: Fall Restrictions Weight Bearing Restrictions: No    Pain: denies    Balance: Standardized Balance Assessment Standardized Balance Assessment: Berg Balance Test;Timed Up and Go Test Berg Balance Test Sit to Stand: Able to stand  independently using hands Standing Unsupported: Able to stand 2 minutes with supervision Sitting with Back Unsupported but Feet Supported on Floor or Stool: Able to sit safely and securely 2 minutes Stand to Sit: Controls descent by using hands Transfers: Able to transfer safely, definite need of hands Standing Unsupported with Eyes Closed: Able to stand 10 seconds with supervision Standing Ubsupported with Feet Together: Able to place feet together independently but unable to hold for 30 seconds From Standing, Reach Forward with Outstretched Arm: Reaches forward but needs supervision From Standing Position, Pick up Object from Floor: Unable to try/needs assist to keep balance From Standing Position, Turn to Look Behind Over each Shoulder: Needs supervision when turning Turn 360 Degrees: Needs assistance while turning Standing Unsupported, Alternately Place Feet on Step/Stool: Able to complete >2 steps/needs minimal assist Standing Unsupported, One Foot in Front: Needs help to step but can hold 15 seconds Standing on One Leg: Unable to try or needs assist to prevent fall Total Score: 25 Timed Up and Go Test TUG: Normal TUG Normal TUG (seconds): 87 (with RW)    Therapy/Group: Individual Therapy  AuLorie Phenix2/30/2021, 5:39 PM

## 2020-09-08 NOTE — Progress Notes (Signed)
Nurse assessed staples to r. Side of patient scalp, no s/sx of infection noted, 21 staples in place. Nurse removed all 21 staples w/o issue. Pt tolerated well, no other concerns noted at this time.

## 2020-09-09 ENCOUNTER — Inpatient Hospital Stay (HOSPITAL_COMMUNITY): Payer: Medicare Other | Admitting: Speech Pathology

## 2020-09-09 ENCOUNTER — Inpatient Hospital Stay (HOSPITAL_COMMUNITY): Payer: Medicare Other

## 2020-09-09 ENCOUNTER — Inpatient Hospital Stay (HOSPITAL_COMMUNITY): Payer: Medicare Other | Admitting: Physical Therapy

## 2020-09-09 NOTE — Plan of Care (Signed)
  Problem: Consults Goal: RH BRAIN INJURY PATIENT EDUCATION Description: Description: See Patient Education module for eduction specifics Outcome: Progressing Goal: Skin Care Protocol Initiated - if Braden Score 18 or less Description: If consults are not indicated, leave blank or document N/A Outcome: Progressing   Problem: RH BOWEL ELIMINATION Goal: RH STG MANAGE BOWEL WITH ASSISTANCE Description: STG Manage Bowel with supervision Assistance. Outcome: Progressing Goal: RH STG MANAGE BOWEL W/MEDICATION W/ASSISTANCE Description: STG Manage Bowel with Medication with Assistance. Outcome: Progressing   Problem: RH BLADDER ELIMINATION Goal: RH STG MANAGE BLADDER WITH ASSISTANCE Description: STG Manage Bladder With supervision Assistance Outcome: Progressing   Problem: RH SKIN INTEGRITY Goal: RH STG SKIN FREE OF INFECTION/BREAKDOWN Description: Patient's skin will be free of infections and breakdown with minimal assistance Outcome: Progressing Goal: RH STG MAINTAIN SKIN INTEGRITY WITH ASSISTANCE Description: STG Maintain Skin Integrity With minimal Assistance. Outcome: Progressing   Problem: RH SAFETY Goal: RH STG ADHERE TO SAFETY PRECAUTIONS W/ASSISTANCE/DEVICE Description: STG Adhere to Safety Precautions With minimal Assistance/Device. Outcome: Progressing   Problem: RH COGNITION-NURSING Goal: RH STG USES MEMORY AIDS/STRATEGIES W/ASSIST TO PROBLEM SOLVE Description: STG Uses Memory Aids/Strategies With minimal Assistance to Problem Solve. Outcome: Progressing   Problem: RH PAIN MANAGEMENT Goal: RH STG PAIN MANAGED AT OR BELOW PT'S PAIN GOAL Description: Pain will be managed at or below 4 out of 10 on pain scale with minimal assist Outcome: Progressing   Problem: RH KNOWLEDGE DEFICIT BRAIN INJURY Goal: RH STG INCREASE KNOWLEDGE OF SELF CARE AFTER BRAIN INJURY Description: Patient/family will increase knowledge of self care after brain injury with handouts and additional  education from nursing and therapies with minimal assistance Outcome: Progressing   

## 2020-09-09 NOTE — Progress Notes (Signed)
Speech Language Pathology Daily Session Note  Patient Details  Name: Amy Bruce MRN: [id number] Date of Birth: 06/11/1951  Today's Date: 09/09/2020 SLP Individual Time: 0725-0820 SLP Individual Time Calculation (min): 55 min  Short Term Goals: Week 2: SLP Short Term Goal 1 (Week 2): STGs=LTGs due to ELOS  Skilled Therapeutic Interventions: Skilled treatment session focused on cognitive and dysphagia goals. Upon arrival, patient was lethargic while in bed but quick to awaken. Patient sat EOB with extra time in order to consume her breakfast meal of regular textures with thin liquids. Patient declined her tray but requested a bowl of cereal. Patient consumed meal without overt s/s of aspiration but required extra time for mastication, suspect due to increased verbosity. Recommend patient continue current diet. Patient demonstrated alternating attention between self-feeding and a functional conversation for ~30 minutes with Min-Mod verbal cues needed for redirection. Extra time was also required today for thought organization. Patient left sitting EOB to finish her banana with alarm on and all needs within reach. Continue with current plan of care.        Pain No/Denies Pain   Therapy/Group: Individual Therapy  Blessin Kanno 09/09/2020, 12:54 PM

## 2020-09-09 NOTE — Progress Notes (Signed)
[facility] PHYSICAL MEDICINE & REHABILITATION PROGRESS NOTE   Subjective/Complaints: No complaints this morning Staples removed yesterday and site is healing well Headaches have improved CMC arthritis has improved  ROS: Pt denies SOB, abd pain, CP, N/V/D, headaches improved, CMC arthritis improved in right thumb  Objective:   DG Hand 2 View Right  Result Date: 09/07/2020 CLINICAL DATA:  Right thumb pain at the metacarpal phalangeal and interphalangeal joints with bruising. Possible injury. EXAM: RIGHT HAND - 2 VIEW COMPARISON:  Radiographs 08/23/2020. FINDINGS: The mineralization and alignment are normal. There is no evidence of acute fracture or dislocation. There are stable mild degenerative changes at the interphalangeal, metacarpal phalangeal and 1st carpometacarpal articulations. No erosive changes or focal soft tissue abnormalities are identified. IMPRESSION: No acute findings. Stable mild degenerative changes. Electronically Signed   By: Carey Bullocks M.D.   On: 09/07/2020 11:56   No results for input(s): WBC, HGB, HCT, PLT in the last 72 hours. No results for input(s): NA, K, CL, CO2, GLUCOSE, BUN, CREATININE, CALCIUM in the last 72 hours.  Intake/Output Summary (Last 24 hours) at 09/09/2020 1130 Last data filed at 09/09/2020 0844 Gross per 24 hour  Intake 360 ml  Output --  Net 360 ml        Physical Exam: Vital Signs Blood pressure (!) 166/74, pulse 94, temperature 98.3 F (36.8 C), temperature source Oral, resp. rate 17, height 5' (1.524 m), weight 75.3 kg, SpO2 100 %. Gen: no distress, normal appearing HEENT: oral mucosa pink and moist, NCAT Cardio: Reg rate Chest: normal effort, normal rate of breathing Abd: soft, non-distended Ext: no edema Neuro:    Comments: RUE 5-/5 in biceps, triceps, WE< grip and finger abd LUE- 4+/5 in same muscles LEs- 5-5/ B/L in HF, KE, DF and PF  Skin:    Comments: Craniotomy site clean and dry with staples removed- healing  well Lips really dry/cracked; no skin breakdown on buttocks or heels; 6 blisters only 1 still has fluid on L wrist/hand- other 5 are flat/popped/healing  Neurological:     Comments: Patient is alert sitting up in chair.  Mild left facial droop.  Makes eye contact with examiner.  She provides her name and age.  She knew the next holiday was Christmas.  Follows simple commands. Intact to light touch in all 4 extremities Slightly slurred speech/dysarthric, but mentation doing well- slightly impulsive.   Psychiatric: appropriate    Assessment/Plan: 1. Functional deficits which require 3+ hours per day of interdisciplinary therapy in a comprehensive inpatient rehab setting.  Physiatrist is providing close team supervision and 24 hour management of active medical problems listed below.  Physiatrist and rehab team continue to assess barriers to discharge/monitor patient progress toward functional and medical goals  Care Tool:  Bathing    Body parts bathed by patient: Right arm,Left arm,Chest,Abdomen,Front perineal area,Right upper leg,Left upper leg,Face,Buttocks   Body parts bathed by helper: Right lower leg,Left lower leg     Bathing assist Assist Level: Minimal Assistance - Patient > 75%     Upper Body Dressing/Undressing Upper body dressing   What is the patient wearing?: Pull over shirt    Upper body assist Assist Level: Supervision/Verbal cueing    Lower Body Dressing/Undressing Lower body dressing      What is the patient wearing?: Incontinence brief,Pants     Lower body assist Assist for lower body dressing: Moderate Assistance - Patient 50 - 74%     Toileting Toileting    Toileting assist Assist  for toileting: Moderate Assistance - Patient 50 - 74%     Transfers Chair/bed transfer  Transfers assist     Chair/bed transfer assist level: Contact Guard/Touching assist Chair/bed transfer assistive device: Programmer, multimedia   Ambulation  assist      Assist level: Contact Guard/Touching assist Assistive device: Walker-rolling Max distance: 10 ft   Walk 10 feet activity   Assist     Assist level: Contact Guard/Touching assist Assistive device: Walker-rolling   Walk 50 feet activity   Assist Walk 50 feet with 2 turns activity did not occur: Safety/medical concerns  Assist level: Minimal Assistance - Patient > 75%      Walk 150 feet activity   Assist Walk 150 feet activity did not occur: Safety/medical concerns         Walk 10 feet on uneven surface  activity   Assist Walk 10 feet on uneven surfaces activity did not occur: Safety/medical concerns         Wheelchair     Assist Will patient use wheelchair at discharge?: Yes Type of Wheelchair: Manual    Wheelchair assist level: Maximal Assistance - Patient 25 - 49% Max wheelchair distance: 150    Wheelchair 50 feet with 2 turns activity    Assist        Assist Level: Moderate Assistance - Patient 50 - 74%   Wheelchair 150 feet activity     Assist      Assist Level: Maximal Assistance - Patient 25 - 49%   Blood pressure (!) 166/74, pulse 94, temperature 98.3 F (36.8 C), temperature source Oral, resp. rate 17, height 5' (1.524 m), weight 75.3 kg, SpO2 100 %.     Medical Problem List and Plan: 1.  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  Status post right frontal parietal craniotomy evacuation of subdural hematoma 08/23/2020             -patient may  Shower if can cover craniotomy site with cap             -ELOS/Goals: 10-14 days- mod I to supervision  Continue CIR 2.  Antithrombotics: -DVT/anticoagulation: SCDs             -antiplatelet therapy: N/A 3. Pain Management/fibromyalgia: Lyrica 100 mg twice daily, Elavil 50 mg nightly, hydrocodone as needed  12/27: increase topamax to 75mg    12/28: continue current dose which is helping.   12/29-31: headaches are much improved  with increase in Amitriptyline dose. Still present so continue current medications.  Will try Lidocaine patch at night on R side of neck.  4. Mood: Provide emotional support             -antipsychotic agents: N/A 5. Neuropsych: This patient is capable of making decisions on her own behalf. 6. Skin/Wound Care: Routine skin checks- daily dressing changes on L hand/wrist. May d/c staples 12/29. Healing well, continue to monitor.  7. Fluids/Electrolytes/Nutrition: Routine in and outs with follow-up chemistries 8.  Seizure prophylaxis.  EEG negative.  Keppra 1000 mg twice daily 9.  Hypertension.  Norvasc 10 mg daily, Lopressor 25 mg twice daily.  Monitor with increased mobility Vitals:   09/08/20 1941 09/09/20 0543  BP: (!) 110/98 (!) 166/74  Pulse: 81 94  Resp: 16 17  Temp: 97.9 F (36.6 C) 98.3 F (36.8 C)  SpO2: 100% 100%  BP and HR elevated: increase Lopressor to 25mg  TID  10.  Hyperlipidemia.  Lipitor 11.  History of PAD.  Patient on aspirin Plavix prior to admission.  Currently on hold due to SDH 12.  Tobacco abuse.  Counseling 13.  Morbid obesity.  BMI 32.68.  Dietary follow-up 14.  Urinary retention.  Urecholine 10 mg 3 times daily.  Check PVR 15.  Dysphagia.  Mechanical soft thin liquids.  Nasogastric tube had been in place for nutritional support with calorie counts and hope to remove soon- getting calorie count currently.  16. Daily headaches- since crani- adding Topamax 50 mg QHS for prevention-   12/24- will try lidocaine patch for R side of neck at night. Also added Robaxin 750 mg q6 hours prn for muscle tightness 17. Constipation: schedule Miralax.  18. Insomnia: increase Amitriptyline to 75mg  HS 19. Dizziness with standing: TED ordered. Orthostatic eval today 20. Right thumb CMC joint pain: XR and Diclofenac gel ordered. XR shows mild arthritic changes. Diclofenac gel appears to have helped.      LOS: 8 days A FACE TO FACE EVALUATION WAS PERFORMED  Linzy Darling P  Ramandeep Arington 09/09/2020, 11:30 AM

## 2020-09-09 NOTE — Progress Notes (Signed)
Physical Therapy Weekly Progress Note  Patient Details  Name: Amy Bruce MRN: [id number] Date of Birth: [birth year]  Beginning of progress report period: September 02, 2020 End of progress report period: September 09, 2020  Today's Date: 09/09/2020 PT Individual Time: 1000-1058 PT Individual Time Calculation (min): 58 min   Patient has met 3 of 3 short term goals.  Pt has made steady progress towards LTG of supervision assist over the past week. She has progressed to supervision assist bed mobility, GCA gait up to 143ft, and CGA-min assist transfers to and from [facility] depending on fatigue.   Patient continues to demonstrate the following deficits muscle weakness, motor apraxia, decreased coordination and decreased motor planning, decreased attention, decreased awareness, decreased problem solving, decreased safety awareness, decreased memory and delayed processing and decreased sitting balance, decreased standing balance and decreased balance strategies and therefore will continue to benefit from skilled PT intervention to increase functional independence with mobility.  Patient progressing toward long term goals..  Continue plan of care.  PT Short Term Goals Week 1:  PT Short Term Goal 1 (Week 1): pt to demonstrate supine<>sit CGA with limited use of bedrails PT Short Term Goal 1 - Progress (Week 1): Met PT Short Term Goal 2 (Week 1): pt to demonstrate functional transfers with LRAD at min A consistently PT Short Term Goal 2 - Progress (Week 1): Met PT Short Term Goal 3 (Week 1): pt to demonstrate gait training with LRAD at 100' min A PT Short Term Goal 3 - Progress (Week 1): Met Week 2:  PT Short Term Goal 1 (Week 2): SG=LTG due to ELOS  Skilled Therapeutic Interventions/Progress Updates:   Pt received supine in bed and agreeable to PT. Supine>sit transfer with supervision assist and increased time. Stand pivot transfer to [facility] with CGA for AD management. Pt transported to rehab gym in [facility].  Gait training with RW 2 x 50ft with min-CGA for AD management to improve sequncing in turns. Pt noted to have decreased step length on the LLE today compared to previous session, and increased difficulty with problem solving when AD stuck on leg rest of chair as pt initiating transfer to sit.  Pt performed sustained attention task of wii bowling x 10 frames with 4 and 3 frames performed in standing prior to requesting seated resk break. Pt required min assist throughout to prevent posterior bias and hand over hand control of remote to improve speed of movements. Pt returned to room and performed stand pivot transfer to bed with RW and min assist and AD control. Sit>supine completed with supervision assist for safety, and left supine in bed with call bell in reach and all needs met.        Therapy Documentation Precautions:  Precautions Precautions: Fall Restrictions Weight Bearing Restrictions: No Pain: denies  Therapy/Group: Individual Therapy  Lorie Phenix 09/09/2020, 6:09 PM

## 2020-09-09 NOTE — Progress Notes (Signed)
Occupational Therapy Weekly Progress Note  Patient Details  Name: Amy Bruce MRN: [id number] Date of Birth: 16-May-1951  Beginning of progress report period: September 02, 2020 End of progress report period: September 09, 2020  Today's Date: 09/09/2020 OT Individual Time: 1300-1400 OT Individual Time Calculation (min): 60 min    Patient has met 4 of 4 short term goals.  Pt has made good progress towards goals currently funcitoning at [facility] level for transfers, S for UB ADLs and MIN A for LB ADLS and MOD A for donning footwear. Pt would benefit from continued AE use to improve independence with donning footwear, transfer training to improve safety awareness and decrease risk of falls as well as education on energy conservation.   Patient continues to demonstrate the following deficits: muscle weakness, decreased cardiorespiratoy endurance, unbalanced muscle activation, decreased coordination and decreased motor planning, decreased motor planning, decreased attention, decreased awareness, decreased problem solving, decreased safety awareness, decreased memory and delayed processing, central origin and decreased standing balance, decreased postural control, hemiplegia and decreased balance strategies and therefore will continue to benefit from skilled OT intervention to enhance overall performance with BADL and iADL.  Patient progressing toward long term goals..  Continue plan of care.  OT Short Term Goals Week 1:  OT Short Term Goal 1 (Week 1): Pt will transfer with CGA consistently to get to toilet with LRAD OT Short Term Goal 1 - Progress (Week 1): Met OT Short Term Goal 2 (Week 1): Pt will don LB clothing wiht A only for standing balance OT Short Term Goal 2 - Progress (Week 1): Met OT Short Term Goal 3 (Week 1): Pt will compelte 2/3 components of toileting wiht MIN A for balance OT Short Term Goal 3 - Progress (Week 1): Met OT Short Term Goal 4 (Week 1): Pt will stand at sink to groom  with MIN A to demo improved standing tolerance OT Short Term Goal 4 - Progress (Week 1): Met Week 2:  OT Short Term Goal 1 (Week 2): STG=LTG d/t ELOS  Skilled Therapeutic Interventions/Progress Updates:    1:1. Pt and daughter present for session. teds donned total A. No pain reported. Overall pt with decreased processing speed/motor planning, increased word finding difficulties as well as decreased step length on LLE. Pt daughter reporting Norco makes pt very tired and likely the reason. Pt agreeable to ambulate with RW to sink and stands to complete oral care ~7 min with min cuing for termination. Pt sits in w/c and OT rinses hair so daughter can braid with hair washing tray at sink. Pt completes mobility in hallway to EOB with MIN A for balance and increased A to turn RW to sit onto bed. Exited session with pt seated in bed, exit alarm on and call light in reach   Therapy Documentation Precautions:  Precautions Precautions: Fall Restrictions Weight Bearing Restrictions: No General:   Vital Signs: Therapy Vitals Temp: 98.3 F (36.8 C) Temp Source: Oral Pulse Rate: 94 Resp: 17 BP: (!) 166/74 Patient Position (if appropriate): Lying (Simultaneous filing. User may not have seen previous data.) Oxygen Therapy SpO2: 100 % (Simultaneous filing. User may not have seen previous data.) O2 Device: Room Air (Simultaneous filing. User may not have seen previous data.) Pain:   ADL: ADL Grooming: Supervision/safety Where Assessed-Grooming: Edge of bed Upper Body Bathing: Supervision/safety Where Assessed-Upper Body Bathing: Edge of bed Lower Body Bathing: Moderate assistance (BSC) Where Assessed-Lower Body Bathing: Other (Comment) Upper Body Dressing: Supervision/safety Where Assessed-Upper Body Dressing:  Edge of bed Lower Body Dressing: Maximal assistance Where Assessed-Lower Body Dressing: Other (Comment) (BSC) Toilet Transfer: Minimal assistance Toilet Transfer Method: Stand  pivot Toilet Transfer Equipment: Bedside commode (RW) Vision   Perception    Praxis   Exercises:   Other Treatments:     Therapy/Group: Individual Therapy  Tonny Branch 09/09/2020, 6:54 AM

## 2020-09-10 DIAGNOSIS — S065X0D Traumatic subdural hemorrhage without loss of consciousness, subsequent encounter: Secondary | ICD-10-CM

## 2020-09-10 DIAGNOSIS — I10 Essential (primary) hypertension: Secondary | ICD-10-CM

## 2020-09-10 DIAGNOSIS — G441 Vascular headache, not elsewhere classified: Secondary | ICD-10-CM

## 2020-09-10 DIAGNOSIS — R339 Retention of urine, unspecified: Secondary | ICD-10-CM

## 2020-09-10 DIAGNOSIS — G479 Sleep disorder, unspecified: Secondary | ICD-10-CM

## 2020-09-10 DIAGNOSIS — Z720 Tobacco use: Secondary | ICD-10-CM

## 2020-09-10 NOTE — Progress Notes (Signed)
[place] PHYSICAL MEDICINE & REHABILITATION PROGRESS NOTE   Subjective/Complaints: Patient seen laying in bed this morning.  She states he slept well overnight.  She initially did not remember me and has some confusion about the course of events.  She states she slept well overnight.  She believes she is improving overall.  ROS: Denies CP, SOB, N/V/D  Objective:   No results found. No results for input(s): WBC, HGB, HCT, PLT in the last 72 hours. No results for input(s): NA, K, CL, CO2, GLUCOSE, BUN, CREATININE, CALCIUM in the last 72 hours.  Intake/Output Summary (Last 24 hours) at 09/10/2020 1626 Last data filed at 09/10/2020 0837 Gross per 24 hour  Intake 360 ml  Output --  Net 360 ml        Physical Exam: Vital Signs Blood pressure 123/64, pulse 81, temperature 98.4 F (36.9 C), resp. rate 14, height 5' (1.524 m), weight 75.3 kg, SpO2 97 %. Constitutional: No distress . Vital signs reviewed. HENT: Normocephalic.  Craniotomy site.. Eyes: EOMI. No discharge. Cardiovascular: No JVD.  RRR. Respiratory: Normal effort.  No stridor.  Bilateral clear to auscultation. GI: Non-distended.  BS +. Skin: Warm and dry.  Craniotomy site CDI. Left wrist with dressing CDI Psych: Normal mood.  Normal behavior. Musc: No edema in extremities.  No tenderness in extremities. Neuro: Alert Motor: Grossly 4+-5/5 throughout   Assessment/Plan: 1. Functional deficits which require 3+ hours per day of interdisciplinary therapy in a comprehensive inpatient rehab setting.  Physiatrist is providing close team supervision and 24 hour management of active medical problems listed below.  Physiatrist and rehab team continue to assess barriers to discharge/monitor patient progress toward functional and medical goals  Care Tool:  Bathing    Body parts bathed by patient: Right arm,Left arm,Chest,Abdomen,Front perineal area,Right upper leg,Left upper leg,Face,Buttocks   Body parts bathed by helper:  Right lower leg,Left lower leg     Bathing assist Assist Level: Minimal Assistance - Patient > 75%     Upper Body Dressing/Undressing Upper body dressing   What is the patient wearing?: Pull over shirt    Upper body assist Assist Level: Supervision/Verbal cueing    Lower Body Dressing/Undressing Lower body dressing      What is the patient wearing?: Incontinence brief,Pants     Lower body assist Assist for lower body dressing: Moderate Assistance - Patient 50 - 74%     Toileting Toileting    Toileting assist Assist for toileting: Moderate Assistance - Patient 50 - 74%     Transfers Chair/bed transfer  Transfers assist     Chair/bed transfer assist level: Contact Guard/Touching assist Chair/bed transfer assistive device: Geologist, engineering   Ambulation assist      Assist level: Contact Guard/Touching assist Assistive device: Walker-rolling Max distance: 10 ft   Walk 10 feet activity   Assist     Assist level: Contact Guard/Touching assist Assistive device: Walker-rolling   Walk 50 feet activity   Assist Walk 50 feet with 2 turns activity did not occur: Safety/medical concerns  Assist level: Minimal Assistance - Patient > 75%      Walk 150 feet activity   Assist Walk 150 feet activity did not occur: Safety/medical concerns         Walk 10 feet on uneven surface  activity   Assist Walk 10 feet on uneven surfaces activity did not occur: Safety/medical concerns         Wheelchair     Assist Will patient  use wheelchair at discharge?: Yes Type of Wheelchair: Manual    Wheelchair assist level: Maximal Assistance - Patient 25 - 49% Max wheelchair distance: 150    Wheelchair 50 feet with 2 turns activity    Assist        Assist Level: Moderate Assistance - Patient 50 - 74%   Wheelchair 150 feet activity     Assist      Assist Level: Maximal Assistance - Patient 25 - 49%   Blood pressure 123/64,  pulse 81, temperature 98.4 F (36.9 C), resp. rate 14, height 5' (1.524 m), weight 75.3 kg, SpO2 97 %.     Medical Problem List and Plan: 1.  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  Status post right frontal parietal craniotomy evacuation of subdural hematoma 08/23/2020  Continue CIR 2.  Antithrombotics: -DVT/anticoagulation: SCDs             -antiplatelet therapy: N/A 3. Pain Management/fibromyalgia: Lyrica 100 mg twice daily, Elavil 50 mg nightly, hydrocodone as needed  Topamax to 75mg    Increased Amitriptyline dose.   Lidocaine patch at night on R side of neck.   Controlled with meds on 1/1 4. Mood: Provide emotional support             -antipsychotic agents: N/A 5. Neuropsych: This patient is?  Fully capable of making decisions on her own behalf. 6. Skin/Wound Care: Routine skin checks- daily dressing changes on L hand/wrist..  7. Fluids/Electrolytes/Nutrition: Routine in and outs 8.  Seizure prophylaxis.  EEG negative.  Keppra 1000 mg twice daily 9.  Hypertension.  Norvasc 10 mg daily,  Monitor with increased mobility Vitals:   09/10/20 0752 09/10/20 1345  BP: 125/63 123/64  Pulse:  81  Resp:  14  Temp:  98.4 F (36.9 C)  SpO2:  97%   Increased Lopressor to 25mg  TID   Controlled on 1/1 10.  Hyperlipidemia: Lipitor 11.  History of PAD.  Patient on aspirin Plavix prior to admission.  Currently on hold due to SDH 12.  Tobacco abuse.    Counsel 13.  Morbid obesity.  BMI 32.68.  Dietary follow-up 14.  Urinary retention.  Urecholine 10 mg 3 times daily.    Improving 15.  Dysphagia.    Now on regular diet with intermittent supervision.  16. Constipation: schedule Miralax.  17.  Sleep disturbance: Amitriptyline 75mg  HS  Improving 18. Dizziness with standing: TED ordered.  19. Right thumb CMC joint pain: XR and Diclofenac gel ordered. XR shows mild arthritic changes. Diclofenac gel appears to have helped.    LOS: 9 days A FACE TO  FACE EVALUATION WAS PERFORMED  Nella Botsford Lorie Phenix 09/10/2020, 4:26 PM

## 2020-09-11 ENCOUNTER — Inpatient Hospital Stay (HOSPITAL_COMMUNITY): Payer: Medicare Other | Admitting: Speech Pathology

## 2020-09-11 DIAGNOSIS — S065X0A Traumatic subdural hemorrhage without loss of consciousness, initial encounter: Secondary | ICD-10-CM

## 2020-09-11 NOTE — Progress Notes (Signed)
[place] PHYSICAL MEDICINE & REHABILITATION PROGRESS NOTE   Subjective/Complaints: Patient seen sitting up in bed this morning.  She states she slept well overnight.  She has questions regarding prognosis and healing.  She denies complaints.  She does not recall me.  ROS: Denies CP, SOB, N/V/D  Objective:   No results found. No results for input(s): WBC, HGB, HCT, PLT in the last 72 hours. No results for input(s): NA, K, CL, CO2, GLUCOSE, BUN, CREATININE, CALCIUM in the last 72 hours.  Intake/Output Summary (Last 24 hours) at 09/11/2020 0908 Last data filed at 09/10/2020 2200 Gross per 24 hour  Intake 400 ml  Output --  Net 400 ml        Physical Exam: Vital Signs Blood pressure 118/61, pulse 85, temperature 98.5 F (36.9 C), resp. rate 14, height 5' (1.524 m), weight 70.8 kg, SpO2 98 %. Constitutional: No distress . Vital signs reviewed. HENT: Normocephalic.  Craniotomy site Eyes: EOMI. No discharge. Cardiovascular: No JVD.  RRR. Respiratory: Normal effort.  No stridor.  Bilateral clear to auscultation. GI: Non-distended.  BS +. Skin: Warm and dry.  Craniotomy site healing. Left wrist with dressing CDI. Psych: Normal mood.  Normal behavior. Musc: No edema in extremities.  No tenderness in extremities. Neuro: Alert Motor: Grossly 4+-5/5 throughout, unchanged  Assessment/Plan: 1. Functional deficits which require 3+ hours per day of interdisciplinary therapy in a comprehensive inpatient rehab setting.  Physiatrist is providing close team supervision and 24 hour management of active medical problems listed below.  Physiatrist and rehab team continue to assess barriers to discharge/monitor patient progress toward functional and medical goals  Care Tool:  Bathing    Body parts bathed by patient: Right arm,Left arm,Chest,Abdomen,Front perineal area,Right upper leg,Left upper leg,Face,Buttocks   Body parts bathed by helper: Right lower leg,Left lower leg     Bathing  assist Assist Level: Minimal Assistance - Patient > 75%     Upper Body Dressing/Undressing Upper body dressing   What is the patient wearing?: Pull over shirt    Upper body assist Assist Level: Supervision/Verbal cueing    Lower Body Dressing/Undressing Lower body dressing      What is the patient wearing?: Incontinence brief,Pants     Lower body assist Assist for lower body dressing: Moderate Assistance - Patient 50 - 74%     Toileting Toileting    Toileting assist Assist for toileting: Moderate Assistance - Patient 50 - 74%     Transfers Chair/bed transfer  Transfers assist     Chair/bed transfer assist level: Contact Guard/Touching assist Chair/bed transfer assistive device: Geologist, engineering   Ambulation assist      Assist level: Contact Guard/Touching assist Assistive device: Walker-rolling Max distance: 10 ft   Walk 10 feet activity   Assist     Assist level: Contact Guard/Touching assist Assistive device: Walker-rolling   Walk 50 feet activity   Assist Walk 50 feet with 2 turns activity did not occur: Safety/medical concerns  Assist level: Minimal Assistance - Patient > 75%      Walk 150 feet activity   Assist Walk 150 feet activity did not occur: Safety/medical concerns         Walk 10 feet on uneven surface  activity   Assist Walk 10 feet on uneven surfaces activity did not occur: Safety/medical concerns         Wheelchair     Assist Will patient use wheelchair at discharge?: Yes Type of Wheelchair: Manual  Wheelchair assist level: Maximal Assistance - Patient 25 - 49% Max wheelchair distance: 150    Wheelchair 50 feet with 2 turns activity    Assist        Assist Level: Moderate Assistance - Patient 50 - 74%   Wheelchair 150 feet activity     Assist      Assist Level: Maximal Assistance - Patient 25 - 49%   Blood pressure 118/61, pulse 85, temperature 98.5 F (36.9 C),  resp. rate 14, height 5' (1.524 m), weight 70.8 kg, SpO2 98 %.     Medical Problem List and Plan: 1.  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  Status post right frontal parietal craniotomy evacuation of subdural hematoma 08/23/2020  Continue CIR 2.  Antithrombotics: -DVT/anticoagulation: SCDs             -antiplatelet therapy: N/A 3. Pain Management/fibromyalgia: Lyrica 100 mg twice daily, Elavil 50 mg nightly, hydrocodone as needed  Topamax to 75mg    Increased Amitriptyline dose.   Lidocaine patch at night on R side of neck.   Controlled with meds on 1/2 4. Mood: Provide emotional support             -antipsychotic agents: N/A 5. Neuropsych: This patient is?  Fully capable of making decisions on her own behalf. 6. Skin/Wound Care: Routine skin checks- daily dressing changes on L hand/wrist..  7. Fluids/Electrolytes/Nutrition: Routine in and outs 8.  Seizure prophylaxis.  EEG negative.  Keppra 1000 mg twice daily 9.  Hypertension.  Norvasc 10 mg daily,  Monitor with increased mobility Vitals:   09/11/20 0259 09/11/20 0813  BP: 116/69 118/61  Pulse: 72 85  Resp: 14   Temp: 98.5 F (36.9 C)   SpO2: 98%    Increased Lopressor to 25mg  TID   Controlled on 1/2 10.  Hyperlipidemia: Lipitor 11.  History of PAD.  Patient on aspirin Plavix prior to admission.  Currently on hold due to SDH 12.  Tobacco abuse.    Counsel 13.  Morbid obesity.  BMI 32.68.  Dietary follow-up 14.  Urinary retention.  Urecholine 10 mg 3 times daily.    With incontinence  PVRs ordered 15.  Dysphagia.    Now on regular diet with intermittent supervision.  16. Constipation: schedule Miralax.  17.  Sleep disturbance: Amitriptyline 75mg  HS  Improved 18. Dizziness with standing: TED ordered.  19. Right thumb CMC joint pain: XR and Diclofenac gel ordered. XR shows mild arthritic changes. Diclofenac gel appears to have helped.    LOS: 10 days A FACE TO FACE EVALUATION  WAS PERFORMED  Author [place] Lorie Phenix 09/11/2020, 9:08 AM

## 2020-09-11 NOTE — Progress Notes (Signed)
Speech Language Pathology Daily Session Note  Patient Details  Name: Amy Bruce MRN: [id number] Date of Birth: [birth year]  Today's Date: 09/11/2020 SLP Individual Time: 1020-1100 SLP Individual Time Calculation (min): 40 min  Short Term Goals: Week 2: SLP Short Term Goal 1 (Week 2): STGs=LTGs due to ELOS  Skilled Therapeutic Interventions:  Pt was seen for skilled ST targeting cognitive goals.  SLP had initially intended to complete medication management task started in previous treatment session; however, pt requested that SLP provide assistance with writing down a list of items from her apartment that pt wanted her daughter to bring to her before returning to [place].  Pt needed mod assist verbal cues for redirection to task at hand as she demonstrated distraction to internal stimuli.  Pt also struggled to locate items on her phone which further pulled her attention from task.  Once redirected, pt was able to generate a list of ~5 items.  Pt was left in bed with bed alarm set and call bell within reach.  Continue per current plan of care.    Pain Pain Assessment Pain Scale: 0-10 Pain Score: 0-No pain  Therapy/Group: Individual Therapy  Zeina Akkerman, Melanee Spry 09/11/2020, 12:43 PM

## 2020-09-12 ENCOUNTER — Inpatient Hospital Stay (HOSPITAL_COMMUNITY): Payer: Medicare Other | Admitting: Speech Pathology

## 2020-09-12 ENCOUNTER — Inpatient Hospital Stay (HOSPITAL_COMMUNITY): Payer: Medicare Other

## 2020-09-12 DIAGNOSIS — S065X1S Traumatic subdural hemorrhage with loss of consciousness of 30 minutes or less, sequela: Secondary | ICD-10-CM

## 2020-09-12 NOTE — Progress Notes (Signed)
Speech Language Pathology Daily Session Note  Patient Details  Name: KATELAND LEISINGER MRN: [id number] Date of Birth: 1951/01/25  Today's Date: 09/12/2020 SLP Individual Time: 1300-1355 SLP Individual Time Calculation (min): 55 min  Short Term Goals: Week 2: SLP Short Term Goal 1 (Week 2): STGs=LTGs due to ELOS  Skilled Therapeutic Interventions: Skilled treatment session focused on cognitive goals. Upon arrival, patient was awake while upright in the wheelchair. SLP facilitated session by providing extra time and overall supervision level verbal cues for error awareness during a complex medication management task. Patient demonstrated mild difficulty demonstrating divided attention between task and functional conversation resulting in errors X 2. Min verbal cues were needed for redirection. Patient left upright in the wheelchair with alarm on and all needs within reach. Continue with current plan of care.      Pain No/Denies Pain   Therapy/Group: Individual Therapy  Sabre Romberger 09/12/2020, 3:14 PM

## 2020-09-12 NOTE — Progress Notes (Signed)
Physical Therapy Session Note  Patient Details  Name: Amy Bruce MRN: [id number] Date of Birth: 09-04-1951  Today's Date: 09/12/2020 PT Individual Time: 0800-0900 PT Individual Time Calculation (min): 60 min   Short Term Goals: Week 2:  PT Short Term Goal 1 (Week 2): SG=LTG due to ELOS  Skilled Therapeutic Interventions/Progress Updates:    Patient received sitting up in bed, finishing breakfast, agreeable to PT. She denies pain. Patient able to come to sit edge of bed with supervision to finish breakfast and watch snow fall. Patient transferring to wc via squat pivot with MinA. She was able to brush teeth seated at sink with set up+ supervision assist. PT propelling patient in wc to therapy gym for time management and energy conservation. She transferred to therapy mat with MinA. Upon standing, patient reporting onset of dizziness with posterior LOB requiring MinA intervention from PT to regain balance. Patient hyperverbal and tangential in speech. She was able to ambulate 44ft x2 with RW and CGA. Decreased L foot clearance, step length noted. Slight dynamic valgus to B knees noted as well. B LE stepping to target with B UE support completed 2x20 to encourage larger and wider BOS for gait. Progressing to cone tapping with same pattern. Patient able to tolerate well with good carryover to gait. Patient returning to room in wc, seatbelt alarm on, call light within reach.   Therapy Documentation Precautions:  Precautions Precautions: Fall Restrictions Weight Bearing Restrictions: No    Therapy/Group: Individual Therapy  Elizebeth Koller, PT, DPT, CBIS  09/12/2020, 7:37 AM

## 2020-09-12 NOTE — Progress Notes (Signed)
[place] PHYSICAL MEDICINE & REHABILITATION PROGRESS NOTE   Subjective/Complaints: Up in bed. No new issues. Pleased with staff/progress. No new complaints  ROS: Patient denies fever, rash, sore throat, blurred vision, nausea, vomiting, diarrhea, cough, shortness of breath or chest pain, joint or back pain, headache, or mood change.    Objective:   No results found. No results for input(s): WBC, HGB, HCT, PLT in the last 72 hours. No results for input(s): NA, K, CL, CO2, GLUCOSE, BUN, CREATININE, CALCIUM in the last 72 hours.  Intake/Output Summary (Last 24 hours) at 09/12/2020 0901 Last data filed at 09/11/2020 2002 Gross per 24 hour  Intake 780 ml  Output --  Net 780 ml        Physical Exam: Vital Signs Blood pressure 109/60, pulse 77, temperature 98 F (36.7 C), resp. rate 17, height 5' (1.524 m), weight 70.8 kg, SpO2 98 %. Constitutional: No distress . Vital signs reviewed. HEENT: EOMI, oral membranes moist Neck: supple Cardiovascular: RRR without murmur. No JVD    Respiratory/Chest: CTA Bilaterally without wheezes or rales. Normal effort    GI/Abdomen: BS +, non-tender, non-distended Ext: no clubbing, cyanosis, or edema Psych: pleasant and cooperative Skin: Warm and dry.  Craniotomy site closed. Left wrist with dried blisters---essentially healed. Musc: No edema in extremities.  No tenderness in extremities. Neuro: Alert and oriented x 3. Normal insight and awareness. Intact Memory. Normal language and speech. Cranial nerve exam unremarkable  Motor: Grossly 4+ to 5/5 throughout  Assessment/Plan: 1. Functional deficits which require 3+ hours per day of interdisciplinary therapy in a comprehensive inpatient rehab setting.  Physiatrist is providing close team supervision and 24 hour management of active medical problems listed below.  Physiatrist and rehab team continue to assess barriers to discharge/monitor patient progress toward functional and medical  goals  Care Tool:  Bathing    Body parts bathed by patient: Right arm,Left arm,Chest,Abdomen,Front perineal area,Right upper leg,Left upper leg,Face,Buttocks   Body parts bathed by helper: Right lower leg,Left lower leg     Bathing assist Assist Level: Minimal Assistance - Patient > 75%     Upper Body Dressing/Undressing Upper body dressing   What is the patient wearing?: Pull over shirt    Upper body assist Assist Level: Supervision/Verbal cueing    Lower Body Dressing/Undressing Lower body dressing      What is the patient wearing?: Incontinence brief,Pants     Lower body assist Assist for lower body dressing: Moderate Assistance - Patient 50 - 74%     Toileting Toileting    Toileting assist Assist for toileting: Moderate Assistance - Patient 50 - 74%     Transfers Chair/bed transfer  Transfers assist     Chair/bed transfer assist level: Contact Guard/Touching assist Chair/bed transfer assistive device: Geologist, engineering   Ambulation assist      Assist level: Contact Guard/Touching assist Assistive device: Walker-rolling Max distance: 10 ft   Walk 10 feet activity   Assist     Assist level: Contact Guard/Touching assist Assistive device: Walker-rolling   Walk 50 feet activity   Assist Walk 50 feet with 2 turns activity did not occur: Safety/medical concerns  Assist level: Minimal Assistance - Patient > 75%      Walk 150 feet activity   Assist Walk 150 feet activity did not occur: Safety/medical concerns         Walk 10 feet on uneven surface  activity   Assist Walk 10 feet on uneven surfaces activity did  not occur: Safety/medical concerns         Wheelchair     Assist Will patient use wheelchair at discharge?: Yes Type of Wheelchair: Manual    Wheelchair assist level: Maximal Assistance - Patient 25 - 49% Max wheelchair distance: 150    Wheelchair 50 feet with 2 turns activity    Assist         Assist Level: Moderate Assistance - Patient 50 - 74%   Wheelchair 150 feet activity     Assist      Assist Level: Maximal Assistance - Patient 25 - 49%   Blood pressure 109/60, pulse 77, temperature 98 F (36.7 C), resp. rate 17, height 5' (1.524 m), weight 70.8 kg, SpO2 98 %.     Medical Problem List and Plan: 1.  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  Status post right frontal parietal craniotomy evacuation of subdural hematoma 08/23/2020  Continue CIR 2.  Antithrombotics: -DVT/anticoagulation: SCDs             -antiplatelet therapy: N/A 3. Pain Management/fibromyalgia: Lyrica 100 mg twice daily, Elavil 50 mg nightly, hydrocodone as needed  Topamax to 75mg    Increased Amitriptyline dose.   Lidocaine patch at night on R side of neck.   Controlled with meds on 1/3 4. Mood: Provide emotional support             -antipsychotic agents: N/A 5. Neuropsych: This patient is?  Fully capable of making decisions on her own behalf. 6. Skin/Wound Care: Routine skin checks- no further dressing needed for left wrist  7. Fluids/Electrolytes/Nutrition: Routine in and outs 8.  Seizure prophylaxis.  EEG negative.  Keppra 1000 mg twice daily 9.  Hypertension.  Norvasc 10 mg daily,  Monitor with increased mobility Vitals:   09/12/20 0501 09/12/20 0745  BP: (!) 110/59 109/60  Pulse: 66 77  Resp: 17   Temp: 98 F (36.7 C)   SpO2: 98%    Increased Lopressor to 25mg  TID   Controlled on 1/3---actually a little too soft 10.  Hyperlipidemia: Lipitor 11.  History of PAD.  Patient on aspirin Plavix prior to admission.  Currently on hold due to SDH 12.  Tobacco abuse.    Counsel 13.  Morbid obesity.  BMI 32.68.  Dietary follow-up 14.  Urinary retention.  Urecholine 10 mg 3 times daily.    With incontinence on occasions. pvr's generally acceptable now. Follow for pattern 15.  Dysphagia.    Now on regular diet with intermittent supervision.  16.  Constipation: schedule Miralax.  17.  Sleep disturbance: Amitriptyline 75mg  HS  Improved 18. Dizziness with standing: TED ordered.  19. Right thumb CMC joint pain: XR and Diclofenac gel ordered. XR shows mild arthritic changes. Diclofenac gel appears to have helped. No c/o   LOS: 11 days A FACE TO FACE EVALUATION WAS PERFORMED  Meredith Staggers 09/12/2020, 9:01 AM

## 2020-09-12 NOTE — Progress Notes (Addendum)
Patient resting in bed upon rounding alert and oriented informed writer that she had "hit her head while leaving the shower on shower bar" with therapist Verbalized discomfort of a headache,but states sensation is no different than her normal headache. Neuro assessed and no indicate of neuro changes from her baseline,some dizziness continue occasionally, patient,denies blurred vision,refer to nursing assessment data sheet. On call Deatra Ina, PA, will follow up tomorrow and Toribio Harbour, Assistant Director notifed  Spoke with patient daughter related to this occurrence, support provided.

## 2020-09-12 NOTE — Progress Notes (Signed)
Occupational Therapy Session Note  Patient Details  Name: Amy Bruce MRN: [id number] Date of Birth: [birth year]  Today's Date: 09/12/2020 OT Individual Time: 7939-0300 OT Individual Time Calculation (min): 69 min    Short Term Goals: Week 2:  OT Short Term Goal 1 (Week 2): STG=LTG d/t ELOS  Skilled Therapeutic Interventions/Progress Updates:    Pt received sitting in the w/c with no c/o pain. Pt agreeable to OT session focused on ADLs at shower level. Assisted pt in looking through suitcase for shower prep and to gather clothes. Pt demonstrating appropriate planning skills, thinking through all of her needs for the shower. Pt doffed clothing with cueing for sitting to remove clothes distally. Pt completed ambulatory transfer into the bathroom with CGA, fading to supervision with the RW. Pt completed bathing seated on the TTB, requiring supervision for UB, min A for LB. Discussed LH sponge use to reach feet, pt reporting she has one at home she uses. Pt completed hair care with mod I while seated. Pt returned to her w/c and dried off completely with min A to reach distally. Pt donned shirt with supervision overall. Depends donned with CGA overall. Intermittent redirection to task required for attention. Pt's clothes were placed into the laundry per her request. She was left sitting up in the w/c with chair alarm belt fastened and all needs met.   Therapy Documentation Precautions:  Precautions Precautions: Fall Restrictions Weight Bearing Restrictions: No   Therapy/Group: Individual Therapy  Curtis Sites 09/12/2020, 6:42 AM

## 2020-09-13 ENCOUNTER — Inpatient Hospital Stay (HOSPITAL_COMMUNITY): Payer: Medicare Other

## 2020-09-13 ENCOUNTER — Inpatient Hospital Stay (HOSPITAL_COMMUNITY): Payer: Medicare Other | Admitting: Speech Pathology

## 2020-09-13 NOTE — Progress Notes (Signed)
Speech Language Pathology Daily Session Note  Patient Details  Name: Amy Bruce MRN: [id number] Date of Birth: [birth year]  Today's Date: 09/13/2020 SLP Individual Time: 0730-0825 SLP Individual Time Calculation (min): 55 min  Short Term Goals: Week 2: SLP Short Term Goal 1 (Week 2): STGs=LTGs due to ELOS  Skilled Therapeutic Interventions: Skilled treatment session focused on cognitive goals. Upon arrival, patient had just received her breakfast. SLP facilitated session by providing Min A verbal cues for divided attention between self-feeding and a functional conversation about events from previous therapy sessions. SLP also facilitated session by providing overall Mod I for problem solving during a complex medication management task. Patient left upright in bed with alarm on and all needs within reach. Continue with current plan of care.      Pain Pain Assessment Pain Scale: 0-10 Pain Score: 0-No pain  Therapy/Group: Individual Therapy  Shelita Steptoe 09/13/2020, 10:20 AM

## 2020-09-13 NOTE — Progress Notes (Signed)
Nutrition Follow-up  DOCUMENTATION CODES:   Obesity unspecified  INTERVENTION:   - Continue Ensure Enlive po TID, each supplement provides 350 kcal and 20 grams of protein  - Continue MVI with minerals daily  - Encourage adequate PO intake  NUTRITION DIAGNOSIS:   Inadequate oral intake related to decreased appetite as evidenced by per patient/family report.  Progressing  GOAL:   Patient will meet greater than or equal to 90% of their needs   Progressing  MONITOR:   PO intake,Supplement acceptance,Diet advancement,Labs,Weight trends,Skin,I & O's  REASON FOR ASSESSMENT:   Consult Enteral/tube feeding initiation and management  ASSESSMENT:   Amy Bruce is a 70 year old right-handed female with history of anxiety, cervical fusion 04/2020, obesity with BMI 32.68, prediabetes, fibromyalgia, hyperlipidemia, hypertension, PAD maintained on aspirin and Plavix as well as history of tobacco use.  Per chart review patient lives alone in [place].  Reportedly independent prior to admission.  She plans to stay with her daughter in [place] on discharge.  Presented 08/23/2020 after reported fall outside of her doctor's office when she lost her balance.  No reported loss of consciousness.  Cranial CT scan showed acute right cerebral convexity greater than falcine subdural hematoma.  12 mm leftward midline shift.  No definite ventricle trapping.    CT cervical spine negative for acute fracture or subluxation.  CT abdomen pelvis no evidence of acute traumatic injury.  Admission chemistries urinalysis negative nitrite urine screen negative, chemistries unremarkable aside calcium 8.6 WBC 10,800.  Follow-up neurosurgery for SDH underwent right frontal parietal craniotomy for evacuation of subdural hematoma 08/23/2020 per Dr. Kathyrn Sheriff.  EEG negative for seizure.  Maintained on Keppra for seizure prophylaxis.  Currently on a mechanical soft thin liquid diet as well as a nasogastric tube had  been in place for nutritional support.  Bouts of urinary retention placed on low-dose Urecholine.  Due to patient's decrease in functional ability altered mental status she was admitted for an inpatient rehab comprehensive therapy course.  12/23 - Cortrak removed 12/24 - diet advanced to regular  Pt with a weight loss of 4.5 kg from 12/23 to 1/02. This is a 6% weight loss in less than 1 month which is severe and significant for timeframe.  Spoke with pt at bedside. Pt is looking forward to d/c to [place] with family on 1/08. Pt states that she feels like she will have no issues with eating when she gets to [place] because her daughter's wife is an excellent cook.  Pt states that she is drinking about 2-3 Ensure supplements daily. She does not really like the food here due to lack of flavor. Pt reports that when her daughter was here, she was bringing her from restaurants and pt did better with this. Pt tries to order yogurt with her lunch and dinner meal trays so that she has something with protein available to her if she does not like the food.  RD discussed always available meal options with pt and provided menu orientation. Pt appreciative and reports that she does enjoy the sandwiches.  Meal Completion: 10-85% x last 8 documented meals  Medications reviewed and include: Ensure Enlive TID, MVI with minerals, protonix, miralax, senna  Labs reviewed.  Diet Order:   Diet Order            Diet regular Room service appropriate? Yes; Fluid consistency: Thin  Diet effective now                 EDUCATION NEEDS:  Education needs have been addressed  Skin:  Skin Assessment: Skin Integrity Issues: Incisions: closed left neck and right head  Last BM:  09/12/20  Height:   Ht Readings from Last 1 Encounters:  09/01/20 5' (1.524 m)    Weight:   Wt Readings from Last 1 Encounters:  09/11/20 70.8 kg    Ideal Body Weight:  45.5 kg  BMI:  Body mass index is 30.48  kg/m.  Estimated Nutritional Needs:   Kcal:  1700-1900  Protein:  85-100 grams  Fluid:  > 1.7 L    Mertie Clause, MS, RD, LDN Inpatient Clinical Dietitian Please see [facility] for contact information.

## 2020-09-13 NOTE — Patient Care Conference (Signed)
Inpatient RehabilitationTeam Conference and Plan of Care Update Date: 09/13/2020   Time: 10:08 AM    Patient Name: Amy Bruce      Medical Record Number: [id number]  Date of Birth: 09-04-1951 Sex: Female         Room/Bed: 4M05C/4M05C-01 Payor Info: Payor: [employer]vertising copy[employer] MEDICARE / Plan: [facility] MEDICARE / Product Type: *No Product type* /    Admit Date/Time:  09/01/2020  4:35 PM  Primary Diagnosis:  Traumatic subdural hematoma [facility])  Hospital Problems: Principal Problem:   Traumatic subdural hematoma (HCC) Active Problems:   Sleep disturbance   Vascular headache   Urinary retention   Essential hypertension    Expected Discharge Date: Expected Discharge Date: 09/17/20  Team Members Present: Physician leading conference: Dr. Faith Rogue Care Coodinator Present: Chana Bode, RN, BSN, CRRN;Becky Dupree, LCSW Nurse Present: Despina Hidden, RN PT Present: Merry Lofty, PT OT Present: Jake Shark, OT SLP Present: Feliberto Gottron, SLP PPS Coordinator present : Fae Pippin, [place], PT     Current Status/Progress Goal Weekly Team Focus  Bowel/Bladder   Continent of B/B/, [facility] 09/12/20  maintian continence  Assess toilting needs QS/PRN   Swallow/Nutrition/ Hydration             ADL's   (S) UB ADLs, CGA LB ADLS, improved endurance and alertness, CGA transfers  Supervision overall  ADLs, ADL transfers, d/c planning, safety awareness, cognitive retraining   Mobility   She remains with dizziness with mobility/prolonged standing. CGA overall with SPV for bed mobility, up to MinA for ambulatory balance  Supervision-mod I overall  progress activity tolerance   Communication             Safety/Cognition/ Behavioral Observations  Supervision-Min A  Supervision  complex  problem solving, recall and attention   Pain   Patient complain of head ache pain,  on pain scale 4-5/10 , and managed on prn medications  maintain a pain goal less than 3  Continue to assess QS/PRN  with follow up  assessment   Skin   s/p craniotomy left surgical incision intact, no drainage,open to open  prevent new breakdown and monitor for signs of infection  Assess skin QS/PRN     Discharge Planning:  Daughter from [facility] coming to get her Sat and take her back to [facility] will need 24/7 care and plan to provide this. Will get DME but will need to get PCP to get home health in [facility] MD there will need to order   Team Discussion: Patient is continent of bowel and bladder and low PVRs; MD discontinued BVI monitoring. Medically stable per MD. Pain controlled with prn medications. Blister on left hand healing Patient on target to meet rehab goals: yes, on track to meet goals. Currently supervision for upper body and CGA for lower body care. Noted dizziness with prolonged standing and occasional loss of balance to the left  *See Care Plan and progress notes for long and short-term goals.   Revisions to Treatment Plan:  Working on sustained attention and divided attentention Teaching Needs: Transfers, toileting, medications, skin care, etc.  Current Barriers to Discharge: Decreased caregiver support  Possible Resolutions to Barriers: Family Education    Medical Summary Current Status: progressing from a cognitive standpoint, pain improved. skin issues resolving. bp under better control  Barriers to Discharge: Medical stability   Possible Resolutions to [employer] Focus: daily assessment of VS,labs, and pt data. finalize medical plan for dc   Continued Need for Acute Rehabilitation Level  of Care: The patient requires daily medical management by a physician with specialized training in physical medicine and rehabilitation for the following reasons: Direction of a multidisciplinary physical rehabilitation program to maximize functional independence : Yes Medical management of patient stability for increased activity during participation in an intensive rehabilitation regime.:  Yes Analysis of laboratory values and/or radiology reports with any subsequent need for medication adjustment and/or medical intervention. : Yes   I attest that I was present, lead the team conference, and concur with the assessment and plan of the team.   Margarito Liner 09/13/2020, 2:28 PM

## 2020-09-13 NOTE — Progress Notes (Signed)
[place] PHYSICAL MEDICINE & REHABILITATION PROGRESS NOTE   Subjective/Complaints: No new issues. Had questions about balance, occ dizziness  ROS: Patient denies fever, rash, sore throat, blurred vision, nausea, vomiting, diarrhea, cough, shortness of breath or chest pain, joint or back pain, headache, or mood change. .    Objective:   No results found. No results for input(s): WBC, HGB, HCT, PLT in the last 72 hours. No results for input(s): NA, K, CL, CO2, GLUCOSE, BUN, CREATININE, CALCIUM in the last 72 hours.  Intake/Output Summary (Last 24 hours) at 09/13/2020 1011 Last data filed at 09/13/2020 0845 Gross per 24 hour  Intake 225 ml  Output --  Net 225 ml        Physical Exam: Vital Signs Blood pressure 111/64, pulse 74, temperature 98 F (36.7 C), resp. rate 16, height 5' (1.524 m), weight 70.8 kg, SpO2 100 %. Constitutional: No distress . Vital signs reviewed. HEENT: EOMI, oral membranes moist Neck: supple Cardiovascular: RRR without murmur. No JVD    Respiratory/Chest: CTA Bilaterally without wheezes or rales. Normal effort    GI/Abdomen: BS +, non-tender, non-distended Ext: no clubbing, cyanosis, or edema Psych: pleasant and cooperative Skin: Warm and dry.  Craniotomy site closed. Left wrist with dried blisters which are peeling off. Musc: No edema in extremities.  No tenderness in extremities. Neuro: Alert and oriented x 3. Normal insight and awareness. Intact Memory. Normal language and speech. Cranial nerve exam unremarkable  Motor: Grossly 4+ to 5/5 throughout  Assessment/Plan: 1. Functional deficits which require 3+ hours per day of interdisciplinary therapy in a comprehensive inpatient rehab setting.  Physiatrist is providing close team supervision and 24 hour management of active medical problems listed below.  Physiatrist and rehab team continue to assess barriers to discharge/monitor patient progress toward functional and medical goals  Care  Tool:  Bathing    Body parts bathed by patient: Right arm,Left arm,Chest,Abdomen,Front perineal area,Right upper leg,Left upper leg,Face,Buttocks   Body parts bathed by helper: Right lower leg,Left lower leg     Bathing assist Assist Level: Minimal Assistance - Patient > 75%     Upper Body Dressing/Undressing Upper body dressing   What is the patient wearing?: Pull over shirt    Upper body assist Assist Level: Supervision/Verbal cueing    Lower Body Dressing/Undressing Lower body dressing      What is the patient wearing?: Incontinence brief,Pants     Lower body assist Assist for lower body dressing: Moderate Assistance - Patient 50 - 74%     Toileting Toileting    Toileting assist Assist for toileting: Moderate Assistance - Patient 50 - 74%     Transfers Chair/bed transfer  Transfers assist     Chair/bed transfer assist level: Contact Guard/Touching assist Chair/bed transfer assistive device: Programmer, multimedia   Ambulation assist      Assist level: Contact Guard/Touching assist Assistive device: Walker-rolling Max distance: 30   Walk 10 feet activity   Assist     Assist level: Contact Guard/Touching assist Assistive device: Walker-rolling   Walk 50 feet activity   Assist Walk 50 feet with 2 turns activity did not occur: Safety/medical concerns  Assist level: Minimal Assistance - Patient > 75%      Walk 150 feet activity   Assist Walk 150 feet activity did not occur: Safety/medical concerns         Walk 10 feet on uneven surface  activity   Assist Walk 10 feet on uneven surfaces activity did  not occur: Safety/medical concerns         Wheelchair     Assist Will patient use wheelchair at discharge?: Yes Type of Wheelchair: Manual    Wheelchair assist level: Maximal Assistance - Patient 25 - 49% Max wheelchair distance: 150    Wheelchair 50 feet with 2 turns activity    Assist        Assist  Level: Moderate Assistance - Patient 50 - 74%   Wheelchair 150 feet activity     Assist      Assist Level: Maximal Assistance - Patient 25 - 49%   Blood pressure 111/64, pulse 74, temperature 98 F (36.7 C), resp. rate 16, height 5' (1.524 m), weight 70.8 kg, SpO2 100 %.     Medical Problem List and Plan: 1.  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  Status post right frontal parietal craniotomy evacuation of subdural hematoma 08/23/2020  Continue CIR. Team conference today.   -ELOS 1/8---home with family in [place] 2.  Antithrombotics: -DVT/anticoagulation: SCDs             -antiplatelet therapy: N/A 3. Pain Management/fibromyalgia: Lyrica 100 mg twice daily, Elavil 50 mg nightly, hydrocodone as needed  Topamax to 75mg    Increased Amitriptyline dose.   Lidocaine patch at night on R side of neck.   Controlled with meds on 1/4 4. Mood: Provide emotional support             -antipsychotic agents: N/A 5. Neuropsych: This patient is?  Fully capable of making decisions on her own behalf. 6. Skin/Wound Care: Routine skin checks- no further dressing needed for left wrist  7. Fluids/Electrolytes/Nutrition: Routine in and outs 8.  Seizure prophylaxis.  EEG negative.  Keppra 1000 mg twice daily 9.  Hypertension.  Norvasc 10 mg daily,  Monitor with increased mobility Vitals:   09/13/20 0359 09/13/20 0720  BP: (!) 144/71 111/64  Pulse: 74 74  Resp: 16   Temp: 98 F (36.7 C)   SpO2: 100%    Increased Lopressor to 25mg  TID   Controlled 1/4 10.  Hyperlipidemia: Lipitor 11.  History of PAD.  Patient on aspirin Plavix prior to admission.  Currently on hold due to SDH 12.  Tobacco abuse.    Counsel 13.  Morbid obesity.  BMI 32.68.  Dietary follow-up 14.  Urinary retention.  Urecholine 10 mg 3 times daily.    Improved  -dc pvr's 15.  Dysphagia.    Now on regular diet with intermittent supervision.  16. Constipation: schedule Miralax.  17.  Sleep  disturbance: Amitriptyline 75mg  HS  Improved 18. Dizziness with standing: TED ordered.   -acclimation  -safety, equipment education 19. Right thumb CMC joint pain: XR and Diclofenac gel ordered. XR shows mild arthritic changes. Diclofenac gel appears to have helped. No c/o   LOS: 12 days A FACE TO FACE EVALUATION WAS PERFORMED  11/11/20 09/13/2020, 10:11 AM

## 2020-09-13 NOTE — Progress Notes (Addendum)
Patient ID: Amy Bruce, female   DOB: 06-09-1951, 70 y.o.   MRN: [id number] Spoke with [name]-daughter via telephone and pt to inform team conference progress toward her goals and still discharge 1/8. Other daughter coming form [facility] to get her and take her back with her. Made aware will get 30 day supply of medications and they will need to get a PCP so can continue her medications and order home health services since they will not take our MD order for home health in [place]. She will talk with her sister to work on getting her a PCP appointment. Will get discharge packet together for daughter to take with her to [facility]. Agreeable to order DME for her to take with her to [facility]. Continue to work on discharge needs.

## 2020-09-13 NOTE — Progress Notes (Signed)
Occupational Therapy Session Note  Patient Details  Name: LENNOX LEIKAM MRN: [id number] Date of Birth: 1951-01-11  Today's Date: 09/13/2020 OT Individual Time: 1400-1515 OT Individual Time Calculation (min): 75 min    Short Term Goals: Week 2:  OT Short Term Goal 1 (Week 2): STG=LTG d/t ELOS  Skilled Therapeutic Interventions/Progress Updates:    Pt received sitting in w/c with no c/o pain. Pt agreeable to OT session focused on ADLs. Discussed d/c planning and equipment to be ordered. Recommend use of shower chair and BSC, as well as HHOT services. Pt requiring redirection to task from verbose story telling at times. Discussed yesterdays shower and pt bumping her head on the grab bar in the shower- pt reporting "I just tapped it!" Pt reporting no pain at this time and saying "I had no headache other than the one I usually have". Pt requesting another set of paper scrub clothes. Pt completed UB bathing and dressing at the sink with set up assist. Intermittent cueing for initiation 2/2 story telling. Pt used dressing stick to doff LB clothes sit > stand with close (S). Pt initiated peri hygiene and then requested to transfer to bathroom. She used RW to complete ambulatory transfer to the bathroom with close (S). Pt voided urine and completed peri hygiene with CGA. Dressing stick used to assist with threading BLE through depends and pants. CGA overall for standing balance. Pt returned to the sink and completed denture and oral care with set up assist while seated. Pt returned to supine in bed and was left with all needs met. Bed alarm set.   Therapy Documentation Precautions:  Precautions Precautions: Fall Restrictions Weight Bearing Restrictions: No  Therapy/Group: Individual Therapy  Curtis Sites 09/13/2020, 7:01 AM

## 2020-09-13 NOTE — Progress Notes (Signed)
Physical Therapy Session Note  Patient Details  Name: Amy Bruce MRN: [id number] Date of Birth: [birth year]  Today's Date: 09/13/2020 PT Individual Time: 0900-0955 PT Individual Time Calculation (min): 55 min   Short Term Goals: Week 2:  PT Short Term Goal 1 (Week 2): SG=LTG due to ELOS  Skilled Therapeutic Interventions/Progress Updates:    Patient received supine in bed, agreeable to PT. She denies pain. Patient able to come to sit edge of bed with CGA from flat bed. She reports onset of "dizziness" when completely supine, but reports having a wedge at home so that she doesn't have to lay completely flat. She was able to transfer to wc via stand pivot with CGA. Standing visual scanning, visual pursuit and cog sequencing tasks completed on BITS with RW for postural support. Patient able to reach outside BOS without LOB. No increased dizziness reported related to these tasks, but patient did report onset of dizziness the longer she stood. Seated rest breaks between activities to help manage symptoms. Patient completing NuStep x5 mins for improved B LE coordination, L >R and B LE strengthening, L >R. She was able to ambulate 55-36ft using RW and CGA with good carryover from reciprocating pattern form NuStep. Patient with LOB x1 to the L requiring MinA from PT to recover. Patient returning to room in wc, seatbelt alarm on, call light within reach.    Therapy Documentation Precautions:  Precautions Precautions: Fall Restrictions Weight Bearing Restrictions: No    Therapy/Group: Individual Therapy  Elizebeth Koller, PT, DPT, CBIS  09/13/2020, 7:39 AM

## 2020-09-14 ENCOUNTER — Inpatient Hospital Stay (HOSPITAL_COMMUNITY): Payer: Medicare Other | Admitting: Physical Therapy

## 2020-09-14 ENCOUNTER — Other Ambulatory Visit (HOSPITAL_COMMUNITY): Payer: Self-pay | Admitting: Physician Assistant

## 2020-09-14 ENCOUNTER — Inpatient Hospital Stay (HOSPITAL_COMMUNITY): Payer: Medicare Other

## 2020-09-14 ENCOUNTER — Inpatient Hospital Stay (HOSPITAL_COMMUNITY): Payer: Medicare Other | Admitting: Speech Pathology

## 2020-09-14 MED ORDER — PANTOPRAZOLE SODIUM 40 MG PO TBEC: 40.0000 mg | DELAYED_RELEASE_TABLET | Freq: Every day | ORAL | 0 refills | Status: DC

## 2020-09-14 MED ORDER — ALBUTEROL SULFATE HFA 108 (90 BASE) MCG/ACT IN AERS: INHALATION_SPRAY | RESPIRATORY_TRACT | 5 refills | Status: DC

## 2020-09-14 MED ORDER — LIDOCAINE 5 % EX PTCH: MEDICATED_PATCH | CUTANEOUS | 1 refills | Status: DC

## 2020-09-14 MED ORDER — DICLOFENAC SODIUM 1 % EX GEL: 2.0000 g | Freq: Four times a day (QID) | CUTANEOUS | 0 refills | Status: DC

## 2020-09-14 MED ORDER — MECLIZINE HCL 12.5 MG PO TABS: 12.5000 mg | ORAL_TABLET | Freq: Two times a day (BID) | ORAL | 0 refills | Status: DC | PRN

## 2020-09-14 MED ORDER — METOPROLOL TARTRATE 25 MG PO TABS: 25.0000 mg | ORAL_TABLET | Freq: Two times a day (BID) | ORAL | 0 refills | Status: DC

## 2020-09-14 MED ORDER — AMLODIPINE BESYLATE 10 MG PO TABS
10.0000 mg | ORAL_TABLET | Freq: Every day | ORAL | 0 refills | Status: DC
Start: 2020-09-15 — End: 2020-09-15

## 2020-09-14 MED ORDER — PREGABALIN 100 MG PO CAPS: 100.0000 mg | ORAL_CAPSULE | Freq: Two times a day (BID) | ORAL | 0 refills | Status: DC

## 2020-09-14 MED ORDER — AMITRIPTYLINE HCL 75 MG PO TABS: 75.0000 mg | ORAL_TABLET | Freq: Every day | ORAL | 0 refills | Status: DC

## 2020-09-14 MED ORDER — ACETAMINOPHEN 325 MG PO TABS: 650.0000 mg | ORAL_TABLET | ORAL | Status: DC | PRN

## 2020-09-14 MED ORDER — VITAMIN B-12 1000 MCG PO TABS: 1000.0000 ug | ORAL_TABLET | Freq: Every day | ORAL | 0 refills | Status: DC

## 2020-09-14 MED ORDER — BETHANECHOL CHLORIDE 10 MG PO TABS: 10.0000 mg | ORAL_TABLET | Freq: Three times a day (TID) | ORAL | 0 refills | Status: DC

## 2020-09-14 MED ORDER — TOPIRAMATE 25 MG PO TABS: 75.0000 mg | ORAL_TABLET | Freq: Every day | ORAL | 0 refills | Status: DC

## 2020-09-14 MED ORDER — LEVETIRACETAM 1000 MG PO TABS: 1000.0000 mg | ORAL_TABLET | Freq: Two times a day (BID) | ORAL | 0 refills | Status: DC

## 2020-09-14 MED ORDER — METHOCARBAMOL 750 MG PO TABS: 750.0000 mg | ORAL_TABLET | Freq: Four times a day (QID) | ORAL | 0 refills | Status: DC | PRN

## 2020-09-14 MED ORDER — ATORVASTATIN CALCIUM 80 MG PO TABS: 80.0000 mg | ORAL_TABLET | Freq: Every day | ORAL | 0 refills | Status: DC

## 2020-09-14 MED ORDER — ADULT MULTIVITAMIN W/MINERALS CH: 1.0000 | ORAL_TABLET | Freq: Every day | ORAL | Status: DC

## 2020-09-14 MED ORDER — POLYETHYLENE GLYCOL 3350 17 G PO PACK: 17.0000 g | PACK | Freq: Every day | ORAL | 0 refills | Status: DC

## 2020-09-14 MED ORDER — HYDROCODONE-ACETAMINOPHEN 5-325 MG PO TABS: 1.0000 | ORAL_TABLET | ORAL | 0 refills | Status: DC | PRN

## 2020-09-14 MED FILL — ALBUTEROL SULFATE HFA 108 (: 108 (90 BAS | 16 days supply | Qty: 18 | Fill #0

## 2020-09-14 MED FILL — ATORVASTATIN CALCIUM 80 MG: 80 | 30 days supply | Qty: 30 | Fill #0

## 2020-09-14 MED FILL — DICLOFENAC SODIUM 1% GEL: 1 | 7 days supply | Qty: 200 | Fill #0

## 2020-09-14 MED FILL — TOPIRAMATE 25 MG TABLET: 25 | 30 days supply | Qty: 90 | Fill #0

## 2020-09-14 MED FILL — METHOCARBAMOL 750 MG TABS: 750 | 15 days supply | Qty: 60 | Fill #0

## 2020-09-14 MED FILL — VITAMIN B-12 1000 MCG TABS: 1000 | 30 days supply | Qty: 30 | Fill #0

## 2020-09-14 MED FILL — MECLIZINE 12.5 MG CAPLET: 12.5 | 15 days supply | Qty: 30 | Fill #0

## 2020-09-14 MED FILL — AMLODIPINE BESYLATE 10 MG T: 10 | 30 days supply | Qty: 30 | Fill #0

## 2020-09-14 MED FILL — PREGABALIN 100 MG CAPS: 100 | 30 days supply | Qty: 60 | Fill #0

## 2020-09-14 MED FILL — HYDROCODON-APAP 5-325: 5-325 | 5 days supply | Qty: 30 | Fill #0

## 2020-09-14 MED FILL — METOPROLOL TARTRATE 25 MG T: 25 | 30 days supply | Qty: 60 | Fill #0

## 2020-09-14 MED FILL — AMITRIPTYLINE HCL 75 MG TAB: 75 | 30 days supply | Qty: 30 | Fill #0

## 2020-09-14 MED FILL — levETIRAcetam 1000 MG TABS: 1000 | 30 days supply | Qty: 60 | Fill #0

## 2020-09-14 MED FILL — BETHANECHOL 10 MG TABLET: 10 | 7 days supply | Qty: 21 | Fill #0

## 2020-09-14 MED FILL — PANTOPRAZOLE SOD DR 40 MG T: 40 | 30 days supply | Qty: 30 | Fill #0

## 2020-09-14 NOTE — Progress Notes (Signed)
[facility] PHYSICAL MEDICINE & REHABILITATION PROGRESS NOTE   Subjective/Complaints: No complaints this morning She is working on organizing her calendar and phone. Denies headache currently, but it does come at times and resolves well with medication  ROS: Patient denies fever, rash, sore throat, blurred vision, nausea, vomiting, diarrhea, cough, shortness of breath or chest pain, joint or back pain, headache, or mood change. .    Objective:   No results found. No results for input(s): WBC, HGB, HCT, PLT in the last 72 hours. No results for input(s): NA, K, CL, CO2, GLUCOSE, BUN, CREATININE, CALCIUM in the last 72 hours.  Intake/Output Summary (Last 24 hours) at 09/14/2020 1220 Last data filed at 09/13/2020 2103 Gross per 24 hour  Intake 440 ml  Output --  Net 440 ml        Physical Exam: Vital Signs Blood pressure (!) 109/50, pulse 75, temperature 98 F (36.7 C), resp. rate 18, height 5' (1.524 m), weight 70.8 kg, SpO2 100 %. Gen: no distress, normal appearing HEENT: oral mucosa pink and moist, NCAT Cardio: Reg rate Chest: normal effort, normal rate of breathing GI/Abdomen: BS +, non-tender, non-distended Ext: no clubbing, cyanosis, or edema Psych: pleasant and cooperative Skin: Warm and dry.  Craniotomy site closed. Left wrist with dried blisters which are peeling off. Musc: No edema in extremities.  No tenderness in extremities. Neuro: Alert and oriented x 3. Normal insight and awareness. Intact Memory. Normal language and speech. Cranial nerve exam unremarkable  Motor: Grossly 4+ to 5/5 throughout  Assessment/Plan: 1. Functional deficits which require 3+ hours per day of interdisciplinary therapy in a comprehensive inpatient rehab setting.  Physiatrist is providing close team supervision and 24 hour management of active medical problems listed below.  Physiatrist and rehab team continue to assess barriers to discharge/monitor patient progress toward functional and  medical goals  Care Tool:  Bathing    Body parts bathed by patient: Right arm,Left arm,Chest,Abdomen,Front perineal area,Right upper leg,Left upper leg,Face,Buttocks   Body parts bathed by helper: Right lower leg,Left lower leg     Bathing assist Assist Level: Minimal Assistance - Patient > 75%     Upper Body Dressing/Undressing Upper body dressing   What is the patient wearing?: Pull over shirt    Upper body assist Assist Level: Supervision/Verbal cueing    Lower Body Dressing/Undressing Lower body dressing      What is the patient wearing?: Incontinence brief,Pants     Lower body assist Assist for lower body dressing: Moderate Assistance - Patient 50 - 74%     Toileting Toileting    Toileting assist Assist for toileting: Moderate Assistance - Patient 50 - 74%     Transfers Chair/bed transfer  Transfers assist     Chair/bed transfer assist level: Contact Guard/Touching assist Chair/bed transfer assistive device: Programmer, multimedia   Ambulation assist      Assist level: Contact Guard/Touching assist Assistive device: Walker-rolling Max distance: 60   Walk 10 feet activity   Assist     Assist level: Contact Guard/Touching assist Assistive device: Walker-rolling   Walk 50 feet activity   Assist Walk 50 feet with 2 turns activity did not occur: Safety/medical concerns  Assist level: Contact Guard/Touching assist Assistive device: Walker-rolling    Walk 150 feet activity   Assist Walk 150 feet activity did not occur: Safety/medical concerns         Walk 10 feet on uneven surface  activity   Assist Walk 10 feet on  uneven surfaces activity did not occur: Safety/medical concerns         Wheelchair     Assist Will patient use wheelchair at discharge?: Yes Type of Wheelchair: Manual    Wheelchair assist level: Maximal Assistance - Patient 25 - 49% Max wheelchair distance: 150    Wheelchair 50 feet with 2  turns activity    Assist        Assist Level: Moderate Assistance - Patient 50 - 74%   Wheelchair 150 feet activity     Assist      Assist Level: Maximal Assistance - Patient 25 - 49%   Blood pressure (!) 109/50, pulse 75, temperature 98 F (36.7 C), resp. rate 18, height 5' (1.524 m), weight 70.8 kg, SpO2 100 %.     Medical Problem List and Plan: 1.  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  Status post right frontal parietal craniotomy evacuation of subdural hematoma 08/23/2020  Continue CIR.  -ELOS 1/8---home with family in [place] 2.  Antithrombotics: -DVT/anticoagulation: SCDs             -antiplatelet therapy: N/A 3. Pain Management/fibromyalgia: Lyrica 100 mg twice daily, Elavil 50 mg nightly, hydrocodone as needed  Topamax to 75mg    Increased Amitriptyline dose.   Lidocaine patch at night on R side of neck.   Controlled with meds on 1/5- continue above regimen 4. Mood: Provide emotional support             -antipsychotic agents: N/A 5. Neuropsych: This patient is?  Fully capable of making decisions on her own behalf. 6. Skin/Wound Care: Routine skin checks- no further dressing needed for left wrist  7. Fluids/Electrolytes/Nutrition: Routine in and outs 8.  Seizure prophylaxis.  EEG negative.  Keppra 1000 mg twice daily 9.  Hypertension.  Norvasc 10 mg daily,  Monitor with increased mobility Vitals:   09/14/20 0450 09/14/20 0722  BP: 110/64 (!) 109/50  Pulse: 65 75  Resp: 18   Temp: 98 F (36.7 C)   SpO2: 100%    Increased Lopressor to 25mg  TID   Controlled 1/5- continue current regimen.  10.  Hyperlipidemia: Lipitor 11.  History of PAD.  Patient on aspirin Plavix prior to admission.  Currently on hold due to SDH 12.  Tobacco abuse.    Counsel 13.  Morbid obesity.  BMI 32.68.  Dietary follow-up 14.  Urinary retention.  Urecholine 10 mg 3 times daily.    Improved  -dc pvr's 15.  Dysphagia.    Now on regular diet  with intermittent supervision.  16. Constipation: schedule Miralax.  17.  Sleep disturbance: Amitriptyline 75mg  HS  Improved, continue to monitor.  18. Dizziness with standing: TED ordered.   -acclimation  -safety, equipment education 19. Right thumb CMC joint pain: XR and Diclofenac gel ordered. XR shows mild arthritic changes. Diclofenac gel appears to have helped. No c/o   LOS: 13 days A FACE TO FACE EVALUATION WAS PERFORMED  11/12/20 P Alina Gilkey 09/14/2020, 12:20 PM

## 2020-09-14 NOTE — Progress Notes (Signed)
Physical Therapy Session Note  Patient Details  Name: Amy Bruce MRN: [id number] Date of Birth: 12/08/1950  Today's Date: 09/14/2020 PT Individual Time: 1430-1530 PT Individual Time Calculation (min): 60 min   Short Term Goals: Week 2:  PT Short Term Goal 1 (Week 2): SG=LTG due to ELOS  Skilled Therapeutic Interventions/Progress Updates:    Pt received seated in w/c in room, agreeable to PT session. No complaints of pain. Sit to stand with CGA to RW throughout session. Ambulation x 120 ft with RW and CGA for balance. Pt reports onset of dizziness once seated following bout of ambulation that resolves while seated. Trial ambulation with vertical then horizontal head turns, pt has onset of dizziness and unable to continue gait with head turns. Ambulation through obstacle course navigating through cones, over obstacles, and standing on airex with RW and CGA overall for balance, min A needed for stance on airex with no UE support x 10 sec before onset of LOB anteriorly. Pt reports urge to use the bathroom. Toilet transfer with CGA and RW to elevated BSC over toilet. Pt is Supervision for clothing management but is unable to void. Pt requests to return to bed at end of session. Sit to supine mod A for BLE management. Pt left seated in bed with needs in reach, bed alarm in place at end of session.  Therapy Documentation Precautions:  Precautions Precautions: Fall Restrictions Weight Bearing Restrictions: No   Therapy/Group: Individual Therapy   Peter Congo, PT, DPT  09/14/2020, 5:51 PM

## 2020-09-14 NOTE — Progress Notes (Signed)
Speech Language Pathology Daily Session Note  Patient Details  Name: Amy Bruce MRN: [id number] Date of Birth: 1950/12/02  Today's Date: 09/14/2020 SLP Individual Time: 0725-0820 SLP Individual Time Calculation (min): 55 min  Short Term Goals: Week 2: SLP Short Term Goal 1 (Week 2): STGs=LTGs due to ELOS  Skilled Therapeutic Interventions: Skilled treatment session focused on cognitive goals. SLP facilitated session by providing extra time for patient to scoot EOB. SLP donned TED hose and socks and provided assistance with pants. Patient ambulated to the commode with CGA and was continent of bladder. Patient requested to perform basic self-care tasks at the sink and was overall Mod I for problem solving with tasks. Intermittent supervision verbal cues were needed for attention to tasks due to verbosity. Patient left upright in the wheelchair with alarm on and all needs within reach. Continue with current plan of care.      Pain Pain Assessment Pain Scale: 0-10 Pain Score: 0-No pain  Therapy/Group: Individual Therapy  Lennox Leikam 09/14/2020, 9:45 AM

## 2020-09-14 NOTE — Progress Notes (Signed)
Occupational Therapy Session Note  Patient Details  Name: SHAINE MOUNT MRN: [id number] Date of Birth: 06-20-1951  Today's Date: 09/14/2020 OT Individual Time: 0920-1015 OT Individual Time Calculation (min): 55 min    Short Term Goals: Week 1:  OT Short Term Goal 1 (Week 1): Pt will transfer with CGA consistently to get to toilet with LRAD OT Short Term Goal 1 - Progress (Week 1): Met OT Short Term Goal 2 (Week 1): Pt will don LB clothing wiht A only for standing balance OT Short Term Goal 2 - Progress (Week 1): Met OT Short Term Goal 3 (Week 1): Pt will compelte 2/3 components of toileting wiht MIN A for balance OT Short Term Goal 3 - Progress (Week 1): Met OT Short Term Goal 4 (Week 1): Pt will stand at sink to groom with MIN A to demo improved standing tolerance OT Short Term Goal 4 - Progress (Week 1): Met Week 2:  OT Short Term Goal 1 (Week 2): STG=LTG d/t ELOS  Skilled Therapeutic Interventions/Progress Updates:     Pt received in w/c agreeable to OT with minor headache at beginning of session with navigation of w/c/turning. Improvement with gaze stabilization during turns  Therapeutic activity Pt completes functional mobility in hallway with S overall and 1 instance of LOB laterally to L when turning R with CGA to recover. Pt often scisoring Legs during mobility/turns with VC for correction.   9HPT RUE: 55.4 LUE 55.6 sec with edu re implications for [facility].  Standing BITS activities standing 1x3 min, 1x 5 min and 1x7 min respectively during memory (recalling sequence up to 5), visual motor/puzzle funciton, and bells cancellation test (2 misses) with L>R bottom>top scanning pattern. Edu re scanning pattern like reading a book for efficiency.   Pt left at end of session in w/c with exit alarm on, call light in reach and all needs met    Therapy Documentation Precautions:  Precautions Precautions: Fall Restrictions Weight Bearing Restrictions: No General:   Vital  Signs: Therapy Vitals Pulse Rate: 75 BP: (!) 109/50 Pain:   ADL: ADL Grooming: Supervision/safety Where Assessed-Grooming: Edge of bed Upper Body Bathing: Supervision/safety Where Assessed-Upper Body Bathing: Edge of bed Lower Body Bathing: Moderate assistance (BSC) Where Assessed-Lower Body Bathing: Other (Comment) Upper Body Dressing: Supervision/safety Where Assessed-Upper Body Dressing: Edge of bed Lower Body Dressing: Maximal assistance Where Assessed-Lower Body Dressing: Other (Comment) (BSC) Toilet Transfer: Minimal assistance Toilet Transfer Method: Stand pivot Toilet Transfer Equipment: Bedside commode (RW) Vision   Perception    Praxis   Exercises:   Other Treatments:     Therapy/Group: Individual Therapy  Tonny Branch 09/14/2020, 9:18 AM

## 2020-09-15 ENCOUNTER — Inpatient Hospital Stay (HOSPITAL_COMMUNITY): Payer: Medicare Other

## 2020-09-15 ENCOUNTER — Inpatient Hospital Stay (HOSPITAL_COMMUNITY): Payer: Medicare Other | Admitting: Speech Pathology

## 2020-09-15 MED ORDER — MECLIZINE HCL 25 MG PO TABS
25.0000 mg | ORAL_TABLET | Freq: Two times a day (BID) | ORAL | Status: DC
  Administered 2020-09-15 – 2020-09-16 (×3): 25 mg via ORAL
  Filled 2020-09-15 (×2): qty 1

## 2020-09-15 NOTE — Progress Notes (Signed)
Physical Therapy Session Note  Patient Details  Name: Amy Bruce MRN: [id number] Date of Birth: Jan 28, 1951  Today's Date: 09/15/2020 PT Individual Time: 1310-1425 PT Individual Time Calculation (min): 75 min   Short Term Goals: Week 2:  PT Short Term Goal 1 (Week 2): SG=LTG due to ELOS  Skilled Therapeutic Interventions/Progress Updates:   Patient received sitting up in bed, finishing lunch, agreeable to PT. She denies pain. While patient finishing lunch, PT providing education on home safety and activity pacing upon dc home. Patient receptive to education. She was able to come to sit edge of bed with supervision and transfer to wc via stand pivot with supervision. Patient propelling herself in wc 139ft with supervision and verbal cues to engage L UE to prevent running into obstacles on L side. Patient able to ambulate serpentine through cones and narrow spaces to emulate home environment ~62ft with CGA and verbal cues for longer step length B. She was able to complete this course x2. Patient stepping over hockey stick with BUE support on RW to emphasize longer step length with good carryover noted to gait tx. Patient returning to room in wc, requesting to use bathroom. She was able to ambulate into bathroom with RW and CGA with noted longer step length. Supervision for clothing management and perihygiene in standing. Patient returning to bed, bed alarm on, call light within reach.    Therapy Documentation Precautions:  Precautions Precautions: Fall Restrictions Weight Bearing Restrictions: No   Therapy/Group: Individual Therapy  Elizebeth Koller, PT, DPT, CBIS  09/15/2020, 7:49 AM

## 2020-09-15 NOTE — Progress Notes (Signed)
[facility] PHYSICAL MEDICINE & REHABILITATION PROGRESS NOTE   Subjective/Complaints: Still c/o dizziness. Seems to be more vertigo-like in nature. Notices it when she sits up in the morning, sometimes with just changing head position  ROS: Patient denies fever, rash, sore throat, blurred vision, nausea, vomiting, diarrhea, cough, shortness of breath or chest pain, joint or back pain, headache, or mood change. .    Objective:   No results found. No results for input(s): WBC, HGB, HCT, PLT in the last 72 hours. No results for input(s): NA, K, CL, CO2, GLUCOSE, BUN, CREATININE, CALCIUM in the last 72 hours.  Intake/Output Summary (Last 24 hours) at 09/15/2020 0955 Last data filed at 09/15/2020 0733 Gross per 24 hour  Intake 580 ml  Output --  Net 580 ml        Physical Exam: Vital Signs Blood pressure (!) 114/57, pulse 71, temperature 97.8 F (36.6 C), temperature source Oral, resp. rate 18, height 5' (1.524 m), weight 70.8 kg, SpO2 100 %. Constitutional: No distress . Vital signs reviewed. HEENT: EOMI, oral membranes moist Neck: supple Cardiovascular: RRR without murmur. No JVD    Respiratory/Chest: CTA Bilaterally without wheezes or rales. Normal effort    GI/Abdomen: BS +, non-tender, non-distended Ext: no clubbing, cyanosis, or edema Psych: pleasant and cooperative Skin: Warm and dry.  Craniotomy site closed. Left wrist with dried blisters which are peeling off. Musc: No edema in extremities.  No tenderness in extremities. Neuro: Alert and oriented x 3. Normal insight and awareness. Intact Memory. Normal language and speech. Cranial nerve exam unremarkable--no frank nystagmus treated with gaze tresting  Motor: Grossly 4+ to 5/5 throughout  Assessment/Plan: 1. Functional deficits which require 3+ hours per day of interdisciplinary therapy in a comprehensive inpatient rehab setting.  Physiatrist is providing close team supervision and 24 hour management of active medical  problems listed below.  Physiatrist and rehab team continue to assess barriers to discharge/monitor patient progress toward functional and medical goals  Care Tool:  Bathing    Body parts bathed by patient: Right arm,Left arm,Chest,Abdomen,Front perineal area,Right upper leg,Left upper leg,Face,Buttocks   Body parts bathed by helper: Right lower leg,Left lower leg     Bathing assist Assist Level: Minimal Assistance - Patient > 75%     Upper Body Dressing/Undressing Upper body dressing   What is the patient wearing?: Pull over shirt    Upper body assist Assist Level: Supervision/Verbal cueing    Lower Body Dressing/Undressing Lower body dressing      What is the patient wearing?: Incontinence brief,Pants     Lower body assist Assist for lower body dressing: Moderate Assistance - Patient 50 - 74%     Toileting Toileting    Toileting assist Assist for toileting: Moderate Assistance - Patient 50 - 74%     Transfers Chair/bed transfer  Transfers assist     Chair/bed transfer assist level: Contact Guard/Touching assist Chair/bed transfer assistive device: Geologist, engineering   Ambulation assist      Assist level: Contact Guard/Touching assist Assistive device: Walker-rolling Max distance: 120'   Walk 10 feet activity   Assist     Assist level: Contact Guard/Touching assist Assistive device: Walker-rolling   Walk 50 feet activity   Assist Walk 50 feet with 2 turns activity did not occur: Safety/medical concerns  Assist level: Contact Guard/Touching assist Assistive device: Walker-rolling    Walk 150 feet activity   Assist Walk 150 feet activity did not occur: Safety/medical concerns  Walk 10 feet on uneven surface  activity   Assist Walk 10 feet on uneven surfaces activity did not occur: Safety/medical concerns         Wheelchair     Assist Will patient use wheelchair at discharge?: Yes Type of  Wheelchair: Manual    Wheelchair assist level: Maximal Assistance - Patient 25 - 49% Max wheelchair distance: 150    Wheelchair 50 feet with 2 turns activity    Assist        Assist Level: Moderate Assistance - Patient 50 - 74%   Wheelchair 150 feet activity     Assist      Assist Level: Maximal Assistance - Patient 25 - 49%   Blood pressure (!) 114/57, pulse 71, temperature 97.8 F (36.6 C), temperature source Oral, resp. rate 18, height 5' (1.524 m), weight 70.8 kg, SpO2 100 %.     Medical Problem List and Plan: 1.  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  Status post right frontal parietal craniotomy evacuation of subdural hematoma 08/23/2020  Continue CIR.    -ELOS 1/8---home with family in [place] 2.  Antithrombotics: -DVT/anticoagulation: SCDs             -antiplatelet therapy: N/A 3. Pain Management/fibromyalgia: Lyrica 100 mg twice daily, Elavil 50 mg nightly, hydrocodone as needed  Topamax to 75mg    Increased Amitriptyline dose.   Lidocaine patch at night on R side of neck.   Controlled with meds on 1/6 4. Mood: Provide emotional support             -antipsychotic agents: N/A 5. Neuropsych: This patient is?  Fully capable of making decisions on her own behalf. 6. Skin/Wound Care: Routine skin checks- no further dressing needed for left wrist  7. Fluids/Electrolytes/Nutrition: Routine in and outs 8.  Seizure prophylaxis.  EEG negative.  Keppra 1000 mg twice daily 9.  Hypertension.  Norvasc 10 mg daily,  Monitor with increased mobility Vitals:   09/15/20 0947 09/15/20 0951  BP: (!) 114/57 (!) 114/57  Pulse:  71  Resp:    Temp:    SpO2:  100%   Increased Lopressor to 25mg  TID   Controlled 1/6 10.  Hyperlipidemia: Lipitor 11.  History of PAD.  Patient on aspirin Plavix prior to admission.  Currently on hold due to SDH 12.  Tobacco abuse.    Counsel 13.  Morbid obesity.  BMI 32.68.  Dietary follow-up 14.  Urinary  retention.  Urecholine 10 mg 3 times daily.    Improved 15.  Dysphagia.    Now on regular diet with intermittent supervision.  16. Constipation: schedule Miralax.  17.  Sleep disturbance: Amitriptyline 75mg  HS  Improved 18. Dizziness with standing: appears generally vestibular in nature. vestibular rx per therapy   -acclimation  -safety, equipment education  -will try meclizine to see if it helps blunt her symptoms slightly 19. Right thumb CMC joint pain: XR and Diclofenac gel ordered. XR shows mild arthritic changes. Diclofenac gel appears to have helped. No c/o   LOS: 14 days A FACE TO FACE EVALUATION WAS PERFORMED  Meredith Staggers 09/15/2020, 9:55 AM

## 2020-09-15 NOTE — Discharge Summary (Signed)
Physician Discharge Summary  Patient ID: Amy Bruce MRN: [id number] DOB/AGE: 1950-09-17 70 y.o.  Admit date: 09/01/2020 Discharge date: 09/17/2020  Discharge Diagnoses:  Principal Problem:   Traumatic subdural hematoma [facility]) Active Problems:   Sleep disturbance   Vascular headache   Urinary retention   Essential hypertension Seizure prophylaxis Hyperlipidemia Tobacco abuse Morbid obesity Constipation PAD Fibromyalgia Prediabetes  Discharged Condition: Stable  Significant Diagnostic Studies: DG Lumbar Spine Complete  Result Date: 08/23/2020 CLINICAL DATA:  Fall, pain EXAM: LUMBAR SPINE - COMPLETE 4+ VIEW COMPARISON:  None. FINDINGS: Hypoplastic ribs at T12. 5 lumbar type vertebral bodies. Vertebral body heights and alignment are maintained. There is no acute fracture identified. No evidence of pars break. Overall mild disc space narrowing, greatest at L5-S1. Lower lumbar facet hypertrophy. IMPRESSION: No acute fracture.  Mild degenerative changes. Electronically Signed   By: Macy Mis M.D.   On: 08/23/2020 10:29   CT HEAD WO CONTRAST  Result Date: 08/24/2020 CLINICAL DATA:  Status post subdural hematoma evacuation EXAM: CT HEAD WITHOUT CONTRAST TECHNIQUE: Contiguous axial images were obtained from the base of the skull through the vertex without intravenous contrast. COMPARISON:  10:57 a.m. FINDINGS: Brain: There has been interval high right frontal craniotomy and evacuation of the previously noted right subdural hematoma with a surgical drain seen within the extra-axial space. Mild residual blood product and pneumocephalus noted. Small subdural hemorrhage layering along the falx is better appreciated on the current examination. Previously noted mass effect has significantly improved with right to left midline shift reduced to approximately 4-5 mm and essentially resolved effacement of the sulci of the right cerebral hemisphere. Small extra-axial blood product seen within  the folia of the right cerebellar hemisphere. Trace subarachnoid hemorrhage at the vertex is unchanged. No interval hemorrhage identified. No acute infarct. Vascular: No asymmetric hyperdense vasculature at the skull base. Skull: No unexpected calvarial fracture. Sinuses/Orbits: Orbits are unremarkable. Paranasal sinuses are clear. Other: Mastoid air cells and middle ear cavities are clear. IMPRESSION: Interval evacuation of right subdural hematoma with surgical drain in place. Mild residual blood product within the subdural space seen both within the operative bed and along the falx as well as trace subarachnoid hemorrhage, unchanged. Markedly improved mass effect with 4-5 mm residual right to left midline shift. No interval hemorrhage. Electronically Signed   By: Fidela Salisbury MD   On: 08/24/2020 01:40   CT Head Wo Contrast  Result Date: 08/23/2020 CLINICAL DATA:  Fall, on anticoagulation EXAM: CT HEAD WITHOUT CONTRAST TECHNIQUE: Contiguous axial images were obtained from the base of the skull through the vertex without intravenous contrast. COMPARISON:  None. FINDINGS: Brain: There is mixed density but primarily hyperdense subdural hematoma along the right cerebral convexity measuring up to 1.5 cm in thickness. Additional thin subdural hemorrhage is present along the right aspect of the falx. There is mass effect on the underlying brain parenchyma with subfalcine herniation. Leftward midline shift measures 12 mm. No definite ventricle trapping at this time. Gray-white differentiation is preserved. Vascular: There is atherosclerotic calcification at the skull base. Skull: Calvarium is unremarkable. Sinuses/Orbits: No acute finding. Other: Right scalp hematoma. IMPRESSION: Acute right cerebral convexity greater than falcine subdural hematoma. 12 mm leftward midline shift. No definite ventricle trapping at this time. These results were called by telephone at the time of interpretation on 08/23/2020 at 11:11 am  to provider Dr. Wyvonnia Dusky, who verbally acknowledged these results. Electronically Signed   By: Macy Mis M.D.   On: 08/23/2020 11:15  CT Cervical Spine Wo Contrast  Result Date: 08/23/2020 CLINICAL DATA:  Neck trauma, fall with trauma to RIGHT side of the head. On blood thinners. EXAM: CT CERVICAL SPINE WITHOUT CONTRAST TECHNIQUE: Multidetector CT imaging of the cervical spine was performed without intravenous contrast. Multiplanar CT image reconstructions were also generated. COMPARISON:  MRI of the cervical spine from March of 2021 FINDINGS: Alignment: Straightening of upper cervical lordotic curvature following fusion at C3 through C5 with anterior cervical discectomy and fusion as seen on the prior MRI. Mild reversal of normal cervical lordotic curvature is also similar with subjacent bony ankylosis at C5-6 and C6-7 about the disc spaces. Skull base and vertebrae: No acute fracture. No primary bone lesion or focal pathologic process. Soft tissues and spinal canal: No prevertebral fluid or swelling. No visible canal hematoma. Disc levels: Spinal fusion with degenerative changes as outlined above. Upper chest: Negative. Other: None IMPRESSION: 1. No acute fracture or subluxation of the cervical spine. 2. Spinal fusion at C3 through C5 with subjacent bony ankylosis at C5-6 and C6-7 about the disc spaces. Unchanged compared to previous MRI. Electronically Signed   By: Donzetta Kohut M.D.   On: 08/23/2020 11:21   CT ABDOMEN PELVIS W CONTRAST  Result Date: 08/23/2020 CLINICAL DATA:  Abdominal trauma.  Fall with abdominal pain. EXAM: CT ABDOMEN AND PELVIS WITH CONTRAST TECHNIQUE: Multidetector CT imaging of the abdomen and pelvis was performed using the standard protocol following bolus administration of intravenous contrast. CONTRAST:  OMNIPAQUE IOHEXOL 300 MG/ML  SOLN COMPARISON:  09/11/2018 FINDINGS: Lower chest: Mild dependent atelectasis in the lung bases. No pleural effusion. Hepatobiliary:  No focal liver abnormality is seen. No gallstones, gallbladder wall thickening, or biliary dilatation. Pancreas: Unremarkable. Spleen: Unremarkable. Adrenals/Urinary Tract: Unremarkable adrenal glands. Mild multifocal left renal scarring. Unchanged 1.7 cm left upper pole renal cyst. No renal calculi or hydronephrosis. Moderately distended bladder. Stomach/Bowel: There is a small sliding hiatal hernia. The stomach is moderately distended by ingested material. There is a moderate amount of stool in the left colon and rectum. There is no evidence of bowel obstruction or inflammation. Mild right-sided colonic diverticulosis is noted without evidence of diverticulitis. The appendix is unremarkable. Vascular/Lymphatic: Abdominal aortic atherosclerosis without aneurysm. No enlarged lymph nodes. Reproductive: Status post hysterectomy. No adnexal masses. Other: No ascites or pneumoperitoneum. Musculoskeletal: No acute osseous abnormality or suspicious osseous lesion. Severe facet arthrosis at L4-5. IMPRESSION: 1. No evidence of acute traumatic injury in the abdomen or pelvis. 2. Moderate colonic stool burden. 3. Aortic Atherosclerosis (ICD10-I70.0). Electronically Signed   By: Sebastian Ache M.D.   On: 08/23/2020 11:28   DG Hand 2 View Right  Result Date: 09/07/2020 CLINICAL DATA:  Right thumb pain at the metacarpal phalangeal and interphalangeal joints with bruising. Possible injury. EXAM: RIGHT HAND - 2 VIEW COMPARISON:  Radiographs 08/23/2020. FINDINGS: The mineralization and alignment are normal. There is no evidence of acute fracture or dislocation. There are stable mild degenerative changes at the interphalangeal, metacarpal phalangeal and 1st carpometacarpal articulations. No erosive changes or focal soft tissue abnormalities are identified. IMPRESSION: No acute findings. Stable mild degenerative changes. Electronically Signed   By: Carey Bullocks M.D.   On: 09/07/2020 11:56   DG CHEST PORT 1 VIEW  Result  Date: 08/23/2020 CLINICAL DATA:  Intubation EXAM: PORTABLE CHEST 1 VIEW COMPARISON:  09/09/2018 and prior. FINDINGS: ETT tip approximately 2 cm above the carina. Partially imaged enteric tube terminates outside field of view. Sequela of ACDF. Patchy bibasilar opacities. No  pneumothorax or pleural effusion. Cardiomediastinal silhouette within normal limits. No acute osseous abnormality. IMPRESSION: ETT tip 2 cm above the carina. Patchy bibasilar opacities, atelectasis versus infiltrates. Electronically Signed   By: Primitivo Gauze M.D.   On: 08/23/2020 19:15   DG Abd Portable 1V  Result Date: 08/26/2020 CLINICAL DATA:  Nasogastric placement EXAM: PORTABLE ABDOMEN - 1 VIEW COMPARISON:  CT 3 days ago FINDINGS: No tube is visible at abdominal radiography. This goes as high as the lower chest. Gas pattern is normal. Arterial atherosclerotic calcification incidentally noted. IMPRESSION: No tube visible at abdominal radiography. This goes as high as the lower chest. Electronically Signed   By: Nelson Chimes M.D.   On: 08/26/2020 14:35   DG Finger Thumb Right  Result Date: 08/23/2020 CLINICAL DATA:  Fall.  Pain. EXAM: RIGHT THUMB 2+V COMPARISON:  None. FINDINGS: Mild osteoarthritis in the D IP joint. No erosion. Negative for fracture. IMPRESSION: Negative for fracture. Electronically Signed   By: Franchot Gallo M.D.   On: 08/23/2020 10:27   EEG adult  Result Date: 08/26/2020 Lora Havens, MD     08/26/2020  5:47 PM Patient Name: Amy Bruce MRN: [id number] Epilepsy Attending: Lora Havens Referring Physician/Provider: Dr Katherene Ponto Date: 08/26/2020 Duration: 34.01 mins Patient history: 70 y.o. female POD# 3 s/p right crani for SDH, has intermittent episodes of agitation followed by somnolence. EEG to evaluate for seizure. Level of alertness:  lethargic AEDs during EEG study: LEV Technical aspects: This EEG study was done with scalp electrodes positioned according to the 10-20  International system of electrode placement. Electrical activity was acquired at a sampling rate of 500Hz  and reviewed with a high frequency filter of 70Hz  and a low frequency filter of 1Hz . EEG data were recorded continuously and digitally stored. Description: EEG showed continuous generalized 3 to 6 Hz theta-delta slowing. Generalized periodic discharges with triphasic morphology at 2hz  were noted. Hyperventilation and photic stimulation were not performed.   ABNORMALITY -Continuous slow, generalized -Periodic discharges with triphasic morphology, generalized IMPRESSION: This study is suggestive of severe diffuse encephalopathy, nonspecific etiology. Generalized periodic discharges with triphasic morphology were noted which is on ictal-interictal continuum but the frequency and morphology can also be seen with toxic-metabolic etiology. No seizures were seen throughout the recording. A prolonged eeg can be considered if concern for ictal-interictal activity persists. [place]:  Basic Metabolic Panel: No results for input(s): NA, K, CL, CO2, GLUCOSE, BUN, CREATININE, CALCIUM, MG, PHOS in the last 168 hours.  CBC: No results for input(s): WBC, NEUTROABS, HGB, HCT, MCV, PLT in the last 168 hours.  CBG: No results for input(s): GLUCAP in the last 168 hours.  Family history.  Mother with ovarian cancer.  Denies any colon cancer esophageal cancer or rectal cancer  Brief HPI:   Amy Bruce is a 70 y.o. right-handed female with history of anxiety, cervical fusion August 2021 obesity with BMI 32.68 prediabetes fibromyalgia hyperlipidemia hypertension PAD maintained on aspirin and Plavix as well as tobacco use.  Per chart review lives alone in [place] reportedly independent prior to admission.  Plans to stay with her daughter in [place].  Presented 08/23/2020 after reported fall outside of her doctor's office when she lost her balance.  No reported loss of consciousness.  Cranial CT scan  showed acute right cerebral convexity greater than falcine subdural hematoma 12 mm leftward midline shift no definite ventricle trapping.  CT cervical spine negative.  CT abdomen pelvis no evidence  of acute traumatic injury.  Admission chemistries unremarkable except calcium 8.6 WBC 10,800.  Follow-up neurosurgery for SDH underwent right frontal parietal craniotomy for evacuation of subdural hematoma 08/23/2020 per Dr. Kathyrn Sheriff.  EEG negative for seizure.  Maintained on Keppra for seizure prophylaxis.  Mechanical soft diet thin liquids.  Bouts of urinary retention placed on Urecholine.  Due to patient decreased functional mobility she was admitted for a comprehensive rehab program.   Hospital Course: Amy Bruce was admitted to rehab 09/01/2020 for inpatient therapies to consist of PT, ST and OT at least three hours five days a week. Past admission physiatrist, therapy team and rehab RN have worked together to provide customized collaborative inpatient rehab.  Pertaining to patient's traumatic SDH she had undergone right frontoparietal craniotomy evacuation of subdural hematoma 08/23/2020.  Surgical site healing nicely.  Her plan was to be discharged to [place] with her daughter she could follow-up with neurosurgery.  SCDs for DVT prophylaxis.  Pain management history of fibromyalgia her Elavil was increased to 75 mg nightly.  Voltaren gel as directed.  Hydrocodone for breakthrough pain.  She was on scheduled Lidoderm patch as well is using Robaxin as needed for spasms.  Her Lyrica was 100 mg twice daily and maintained on Topamax 75 mg daily.  Lipitor for hyperlipidemia.  Blood pressure controlled on Norvasc as well as Lopressor.  Maintained on Keppra initially for seizure prophylaxis EEG negative.  She did have a history of tobacco abuse receiving counsel guard cessation nicotine products.  History of PAD maintained on aspirin Plavix prior to admission currently on hold due to SDH.  Bouts of urinary  retention placed on Urecholine voiding much improved tapered off of Urecholine.  Her diet was advanced to regular.   Blood pressures were monitored on TID basis and controlled    Rehab course: During patient's stay in rehab weekly team conferences were held to monitor patient's progress, set goals and discuss barriers to discharge. At admission, patient required min assist sit stand minimal assist ambulating 100 feet rolling walker minimal assist supine to sit  Physical exam.  Blood pressure 142/63 pulse 87 temperature 98.3 respirations 18 oxygen saturation 99% room air Constitutional.  No acute distress sitting up in bed HEENT Head.  Right craniotomy site clean and dry Eyes.  Pupils round and reactive to light no discharge.nystagmus Neck.  Supple nontender no JVD without thyromegaly Cardiac regular rate rhythm no extra sounds or murmur heard Abdomen.  Soft nontender positive bowel sounds without rebound Respiratory effort normal no respiratory distress without wheeze Musculoskeletal.  Normal range of motion Right upper extremity 5 -/5 biceps triceps wrist extension grip and finger abduction Left upper extremity 4+/5 in same muscles Lower extremities 5 -/5 bilateral hip flexors knee extension dorsi plantarflexion Neurologic.  Alert mild left facial droop makes eye contact with examiner provides her name and age follows simple commands   He/She  has had improvement in activity tolerance, balance, postural control as well as ability to compensate for deficits. He/She has had improvement in functional use RUE/LUE  and RLE/LLE as well as improvement in awareness.  Sit to stand contact-guard assist rolling walker throughout sessions ambulates 120 feet rolling walker contact-guard assist.  Monitor for any increased dizziness.  Ambulates through obstacle course navigating through cones over obstacles and standing rolling walker contact-guard assist.  Toilet transfers contact-guard assist rolling  walker to elevated bedside commode supervision for clothing management.  Patient completes functional ability in hallway with supervision overall.  Patient requested to  perform basic self-care task at the sink with overall modified independent for problem solving with task intermittent supervision verbal cues were needed for attention.  Full family teaching completed plan discharge to home       Disposition: Discharge to home    Diet: Regular  Special Instructions: No driving smoking or alcohol  No aspirin or Plavix secondary to subdural hematoma  Medications at discharge 1.  Tylenol as needed 2.  Elavil 75 mg p.o. nightly 3.  Norvasc 10 mg p.o. daily 4.  Lipitor 80 mg p.o. daily 5.  Urecholine 10 mg 3 times daily taper as directed 6.  Voltaren gel 4 times daily 7.  Hydrocodone 1 tablet every 4 hours as needed pain 8.  Keppra 1000 mg p.o. twice daily 9.  Lidoderm patch as directed 10.  Antivert 12.5 mg twice daily as needed dizziness 11.  Robaxin 750 mg every 6 as needed muscle spasms 12.  Lopressor 25 mg p.o. twice daily 13.  Multivitamin daily 14.  Protonix 40 mg p.o. daily 15.  MiraLAX daily hold for loose stools 16.  Lyrica 100 mg p.o. twice daily 17.  Topamax 25 mg p.o. daily   30-35 minutes were spent completing discharge summary and discharge planning Allergies as of 09/16/2020      Reactions   Carisoprodol Swelling   Duloxetine Hcl Swelling, Hypertension      Medication List    STOP taking these medications   aspirin EC 81 MG tablet   calcium carbonate 500 MG chewable tablet Commonly known as: TUMS - dosed in mg elemental calcium   cilostazol 100 MG tablet Commonly known as: PLETAL   clopidogrel 75 MG tablet Commonly known as: Plavix   cyclobenzaprine 10 MG tablet Commonly known as: FLEXERIL   diazepam 5 MG tablet Commonly known as: VALIUM   diclofenac 50 MG EC tablet Commonly known as: VOLTAREN   nicotine 21 mg/24hr patch Commonly known as:  NICODERM CQ - dosed in mg/24 hours   nicotine polacrilex 2 MG lozenge Commonly known as: COMMIT   oxyCODONE-acetaminophen 7.5-325 MG tablet Commonly known as: PERCOCET   tiZANidine 4 MG tablet Commonly known as: ZANAFLEX   VICKS VAPORUB EX     TAKE these medications   acetaminophen 325 MG tablet Commonly known as: TYLENOL Take 2 tablets (650 mg total) by mouth every 4 (four) hours as needed for mild pain (temp > 100.5). Notes to patient: **NEW** For mild pain or fever. Do not take more than 3000mg  per day.   albuterol 108 (90 Base) MCG/ACT inhaler Commonly known as: VENTOLIN HFA INHALE 1 TO 2 PUFFS INTO THE LUNGS EVERY 4 HOURS AS NEEDED FOR WHEEZING What changed: See the new instructions. Notes to patient: For shortness of breath   amitriptyline 75 MG tablet Commonly known as: ELAVIL Take 1 tablet (75 mg total) by mouth at bedtime. What changed:   medication strength  See the new instructions. Notes to patient: **INCREASED DOSE** For nerve pain and sleep   amLODipine 10 MG tablet Commonly known as: NORVASC Take 1 tablet (10 mg total) by mouth daily. Notes to patient: **NEW** To lower blood pressure   atorvastatin 80 MG tablet Commonly known as: LIPITOR Take 1 tablet (80 mg total) by mouth daily. Notes to patient: To lower cholesterol   bethanechol 10 MG tablet Commonly known as: URECHOLINE Take 1 tablet (10 mg total) by mouth 3 (three) times daily. Notes to patient: **NEW** To help with urination    diclofenac Sodium 1 %  Gel Commonly known as: VOLTAREN Apply 2 g topically 4 (four) times daily. Notes to patient: **NEW** For joint/muscle pain   fexofenadine 180 MG tablet Commonly known as: ALLEGRA Take 180 mg by mouth daily. Notes to patient: For seasonal allergies   Fish Oil 1000 MG Caps Take 1 capsule by mouth daily. Notes to patient: Supplement   HYDROcodone-acetaminophen 5-325 MG tablet Commonly known as: NORCO/VICODIN Take 1 tablet by mouth every  4 (four) hours as needed for moderate pain. Notes to patient: **NEW** For severe pain. May cause drowsiness, dizziness and constipation. May use over-the-counter docusate, miralax or senna for constipation. STOP TAKING Oxycodone/acetaminophen (Percocet)   levETIRAcetam 1000 MG tablet Commonly known as: KEPPRA Take 1 tablet (1,000 mg total) by mouth 2 (two) times daily. Notes to patient: **NEW** To prevent seizures   lidocaine 5 % Commonly known as: LIDODERM UNWRAP AND APPLY 1 PATCH TO SKIN DAILY. REMOVE AND DISCARD PATCH WITHIN 12 HOURS OR AS DIRECTED BY MD. What changed:   how much to take  how to take this  when to take this  reasons to take this Notes to patient: For neck pain   meclizine 12.5 MG tablet Commonly known as: ANTIVERT Take 1 tablet (12.5 mg total) by mouth 2 (two) times daily as needed for dizziness. Notes to patient: **NEW** For dizziness. May cause drowsiness    methocarbamol 750 MG tablet Commonly known as: ROBAXIN Take 1 tablet (750 mg total) by mouth every 6 (six) hours as needed for muscle spasms. Notes to patient: **NEW** For muscle spasms. May cause drowsiness and dizziness. STOP TAKING tizanidine and diazepam   metoprolol tartrate 25 MG tablet Commonly known as: LOPRESSOR Take 1 tablet (25 mg total) by mouth 2 (two) times daily. Notes to patient: **NEW** To lower heart rate   multivitamin with minerals Tabs tablet Take 1 tablet by mouth daily. Notes to patient: **NEW** Supplement   pantoprazole 40 MG tablet Commonly known as: PROTONIX Take 1 tablet (40 mg total) by mouth daily. Notes to patient: To lower stomach acid   polyethylene glycol 17 g packet Commonly known as: MIRALAX / GLYCOLAX Take 17 g by mouth daily. Notes to patient: **NEW** To prevent constipation. Hold if loose stools   pregabalin 100 MG capsule Commonly known as: LYRICA Take 1 capsule (100 mg total) by mouth 2 (two) times daily. What changed:   medication  strength  See the new instructions. Notes to patient: **DOSE DECREASED** For nerve pain   topiramate 25 MG tablet Commonly known as: TOPAMAX Take 3 tablets (75 mg total) by mouth daily at 2 PM. Notes to patient: **NEW** To prevent headaches   vitamin B-12 1000 MCG tablet Commonly known as: CYANOCOBALAMIN Take 1 tablet (1,000 mcg total) by mouth daily. Notes to patient: Supplement       Follow-up Information    Meredith Staggers, MD Follow up.   Specialty: Physical Medicine and Rehabilitation Why: No follow up needed as patient is leaving the area to live with her daughter in [place] information: [address] [place] [address] [phone]        Consuella Lose, MD Follow up.   Specialty: Neurosurgery Why: Call for appointment Contact information: [address] [place] [address] [phone]               Signed: Lavon Paganini [place] 09/16/2020, 5:04 AM

## 2020-09-15 NOTE — Discharge Instructions (Signed)
Inpatient Rehab Discharge Instructions  Amy Bruce Discharge date and time: No discharge date for patient encounter.   Activities/Precautions/ Functional Status: Activity: activity as tolerated Diet: Regular Wound Care: routine skin checks Functional status:  ___ No restrictions     ___ Walk up steps independently ___ 24/7 supervision/assistance   ___ Walk up steps with assistance ___ Intermittent supervision/assistance  ___ Bathe/dress independently ___ Walk with walker     _x__ Bathe/dress with assistance ___ Walk Independently    ___ Shower independently ___ Walk with assistance    ___ Shower with assistance ___ No alcohol     ___ Return to work/school ________  Special Instructions: No driving smoking or alcohol   No aspirin or Plavix until further notice due to subdural hematoma  COMMUNITY REFERRALS UPON DISCHARGE:    Home Health: TO BE ARRANGED BY FAMILY ONCE OBTAINS PCP FOR PT AND [place] MD CAN WRITE THE ORDER FOR THIS  Medical Equipment/Items Ordered:ROLLNG WALKER, 3 IN 1 AND TUB BENCH                                                 Agency/Supplier:[facility]  [phone]   My questions have been answered and I understand these instructions. I will adhere to these goals and the provided educational materials after my discharge from the hospital.  Patient/Caregiver Signature _______________________________ Date __________  Clinician Signature _______________________________________ Date __________  Please bring this form and your medication list with you to all your follow-up doctor's appointments.

## 2020-09-15 NOTE — Progress Notes (Signed)
Speech Language Pathology Daily Session Note  Patient Details  Name: SHALUNDA LINDH MRN: [id number] Date of Birth: [birth year]  Today's Date: 09/15/2020 SLP Individual Time: 0830-0925 SLP Individual Time Calculation (min): 55 min  Short Term Goals: Week 2: SLP Short Term Goal 1 (Week 2): STGs=LTGs due to ELOS  Skilled Therapeutic Interventions: Skilled treatment session focused on cognitive goals. Upon arrival, patient was awake while upright in bed. Patient sat EOB with extra time and SLP donned TED hoes. Patient transferred to the wheelchair with CGA to maximize arousal for cognitive functioning. SLP administered the [facility] Mental Status Examination (SLUMS) and patient scored 24 /30 points with a score of 27 or above considered normal. Patient demonstrated deficits in short-term memory, however, provided education to the patient regarding improvement of carryover of functional information as it relates to her care and discharge planning. SLP also initiated education that focused on discharge and memory compensatory strategies to utilize at home. Patient verbalized understanding and education will be completed next session. Patient left upright in the wheelchair with alarm on and all needs within reach. Continue with current plan of care.       Pain No/Denies Pain   Therapy/Group: Individual Therapy  Sholom Dulude 09/15/2020, 10:19 AM

## 2020-09-15 NOTE — Progress Notes (Signed)
Occupational Therapy Session Note  Patient Details  Name: Amy Bruce MRN: [id number] Date of Birth: 1951-08-05  Today's Date: 09/15/2020 OT Individual Time: 1000-1057 OT Individual Time Calculation (min): 57 min    Short Term Goals: Week 1:  OT Short Term Goal 1 (Week 1): Pt will transfer with CGA consistently to get to toilet with LRAD OT Short Term Goal 1 - Progress (Week 1): Met OT Short Term Goal 2 (Week 1): Pt will don LB clothing wiht A only for standing balance OT Short Term Goal 2 - Progress (Week 1): Met OT Short Term Goal 3 (Week 1): Pt will compelte 2/3 components of toileting wiht MIN A for balance OT Short Term Goal 3 - Progress (Week 1): Met OT Short Term Goal 4 (Week 1): Pt will stand at sink to groom with MIN A to demo improved standing tolerance OT Short Term Goal 4 - Progress (Week 1): Met  Skilled Therapeutic Interventions/Progress Updates:     Pt received in w/c agreeable to OT with no pain or dizziness  ADL:  Pt completes bathing with S using grab bar to steady self furing standing peri care and LHSS to was B feet and back Pt completes UB dressing with set up Pt completes LB dressing with Supervision at sit to stand level using stool toelevate foot to thread RLE Pt completes footwear with S and similar set up as stated above Pt completes shower/Tub transfer with supervision at ambulatory level using RW into/out of bathroom with VC for hand placement.   Pt left at end of session in bed with exit alarm on, call light in reach and all needs met   Therapy Documentation Precautions:  Precautions Precautions: Fall Restrictions Weight Bearing Restrictions: No General:   Vital Signs: Therapy Vitals Temp: 97.8 F (36.6 C) Temp Source: Oral Pulse Rate: 71 BP: (!) 109/54 Oxygen Therapy SpO2: 98 % Pain:   ADL: ADL Grooming: Supervision/safety Where Assessed-Grooming: Edge of bed Upper Body Bathing: Supervision/safety Where Assessed-Upper Body  Bathing: Edge of bed Lower Body Bathing: Moderate assistance (BSC) Where Assessed-Lower Body Bathing: Other (Comment) Upper Body Dressing: Supervision/safety Where Assessed-Upper Body Dressing: Edge of bed Lower Body Dressing: Maximal assistance Where Assessed-Lower Body Dressing: Other (Comment) (BSC) Toilet Transfer: Minimal assistance Toilet Transfer Method: Stand pivot Toilet Transfer Equipment: Bedside commode (RW) Vision   Perception    Praxis   Exercises:   Other Treatments:     Therapy/Group: Individual Therapy  Tonny Branch 09/15/2020, 6:44 AM

## 2020-09-15 NOTE — Progress Notes (Signed)
Patient was sleepy after receiving her meclizine. No complaints of dizziness.Continued to monitor.

## 2020-09-16 ENCOUNTER — Inpatient Hospital Stay (HOSPITAL_COMMUNITY): Payer: Medicare Other

## 2020-09-16 ENCOUNTER — Inpatient Hospital Stay (HOSPITAL_COMMUNITY): Payer: Medicare Other | Admitting: Speech Pathology

## 2020-09-16 NOTE — Progress Notes (Signed)
Occupational Therapy Discharge Summary  Patient Details  Name: Amy Bruce MRN: [id number] Date of Birth: [birth year]  Today's Date: 09/16/2020 OT Individual Time: 8127-5170 OT Individual Time Calculation (min): 60 min     1;1. Pt received in bed agreeable to OT with no pain or dizziness reported this session. Pt dons pants EOB using stool to elevate foot to thread RLE. Pt sit to stand with S to pull up pants and completes ambulationin room with RW to sink. Pt grooms seated in w/c for energy conservation. Pt completes practice step over shower transfer with RW using posterior method and sits into shower chair with S with VC for sequencing and RW management. DC instructions for pt written as follows:  1. Amy Bruce can move with supervision meaning she should not need hands on assistance when using her RW. If she seems unsteady or dizzy, support can be provided at her hips for minor balance corrections.  2. Amy Bruce should use her walker all the time when she is standing or getting up/ moving around even for short distances. When she stands up with the walker she should push with one hand from her chair/bed/toilet and have the other hand on the walker. 3. When Amy Bruce is standing (maybe pulling her pants up) or walker someone should be next to her or behind her in case she has some balance "wobbles." Essentially she needs a "shadow" to be her second set of eyes for safety challenges. Her most common time when she loses balance is when she is turning. So keep that in mind if she is turning to sit into a chair or the bed. 4. Amy Bruce can do all her components/steps of bathing, dressing, toileting and grooming with supervision, but someone should be with her when she is getting up/down. If she is seated she is safe. This wont be forever, follow up therapy can assist when determining phasing away supervision to true independence. 5. Amy Bruce should bathe sitting down to saver her energy. (energy conservation handout is provided with  specific tips to help with this). She can stand to wash her butt/in between her legs. A long handled sponge has been provided during rehab to help her wash her feet and back independently 6. When Amy Bruce is putting her socks on or her legs in her pants she should be seated and can use a footstool under her right leg to bring the floor up to her so she doesn't need to bend so far forward. This can sometimes make her dizzy.  7. Amy Bruce can use the tub transfer bench to get in/out of the tub. She should back up to the edge of the bench that hangs out of the tub, sit, remove the walker, swing both her legs in and then scoot over. Reverse to come out of the tub. BUT BOTH legs need to be out of the tub before she gets up with the walker after showering.  8. Amy Bruce is a talker which can be distracting at times. Sometimes she needs to be reminded to be "focused" on her task which includes not talking.   Exited session with pt seated in bed, exit alarm on and call light in reach    Patient has met 10 of 10 long term goals due to improved activity tolerance, improved balance, postural control, ability to compensate for deficits, functional use of  RIGHT upper extremity, improved attention, improved awareness and improved coordination.  Patient to discharge at overall Supervision level.  Patient's care partner is independent to  provide the necessary cognitive assistance at discharge.    Reasons goals not met: n/a  Recommendation:  Patient will benefit from ongoing skilled OT services in home health setting to continue to advance functional skills in the area of BADL and iADL.  Equipment: TTB/BSC  Reasons for discharge: treatment goals met and discharge from hospital  Patient/family agrees with progress made and goals achieved: Yes  OT Discharge Precautions/Restrictions  Precautions Precautions: Fall Restrictions Weight Bearing Restrictions: No General   Vital Signs Therapy Vitals Temp: 99.7 F (37.6  C) Temp Source: Oral Pulse Rate: 60 BP: (!) 121/59 Oxygen Therapy SpO2: 98 % Pain Pain Assessment Pain Scale: 0-10 Pain Score: 3  Pain Type: Acute pain Pain Location: Head Pain Orientation: Right Pain Descriptors / Indicators: Aching Pain Frequency: Intermittent Pain Intervention(s): Medication (See eMAR) ADL ADL Grooming: Modified independent Where Assessed-Grooming: Edge of bed Upper Body Bathing: Setup Where Assessed-Upper Body Bathing: Shower Lower Body Bathing: Supervision/safety Where Assessed-Lower Body Bathing: Shower Upper Body Dressing: Setup Where Assessed-Upper Body Dressing: Sitting at sink Lower Body Dressing: Supervision/safety Where Assessed-Lower Body Dressing: Standing at sink,Sitting at sink Toileting: Supervision/safety Where Assessed-Toileting: Glass blower/designer: Close supervision Toilet Transfer Method: Counselling psychologist: Engineer, technical sales Transfer: Close supervison Tub/Shower Transfer Method: Ambulating Vision Baseline Vision/History: Wears glasses Wears Glasses: At all times Patient Visual Report: No change from baseline Vision Assessment?: No apparent visual deficits Perception  Perception: Within Functional Limits Praxis Praxis: Intact Cognition Overall Cognitive Status: Impaired/Different from baseline Arousal/Alertness: Awake/alert Orientation Level: Oriented X4 Attention: Sustained Focused Attention: Appears intact Sustained Attention: Appears intact Sustained Attention Impairment: Verbal complex;Functional complex Selective Attention: Impaired Selective Attention Impairment: Functional complex Memory: Impaired Memory Impairment: Decreased recall of new information;Decreased short term memory Decreased Short Term Memory: Verbal basic;Functional basic Awareness: Impaired Awareness Impairment: Emergent impairment Problem Solving: Impaired Problem Solving Impairment: Functional  complex Safety/Judgment: Appears intact [place] [employer] Scales of Cognitive Functioning: Purposeful/appropriate Sensation Sensation Light Touch: Appears Intact Coordination Gross Motor Movements are Fluid and Coordinated: Yes Fine Motor Movements are Fluid and Coordinated: No Motor  Motor Motor: Within Functional Limits Mobility  Bed Mobility Rolling Right: Independent with assistive device Rolling Left: Independent with assistive device Supine to Sit: Supervision/Verbal cueing Sitting - Scoot to Edge of Bed: Supervision/Verbal cueing Sit to Supine: Supervision/Verbal cueing Transfers Sit to Stand: Supervision/Verbal cueing  Trunk/Postural Assessment  Cervical Assessment Cervical Assessment:  (forward head) Thoracic Assessment Thoracic Assessment:  (rounded hsoudler) Lumbar Assessment Lumbar Assessment:  (post pelvic tilt) Postural Control Postural Control: Deficits on evaluation (improved from eval, delayed/insufficient)  Balance Balance Balance Assessed: Yes Static Standing Balance Static Standing - Level of Assistance: 5: Stand by assistance Dynamic Standing Balance Dynamic Standing - Level of Assistance: 5: Stand by assistance Extremity/Trunk Assessment RUE Assessment General Strength Comments: generalized weakness Brunstrum level for arm: Stage V Relative Independence from Synergy Brunstrum level for hand: Stage VI Isolated joint movements LUE Assessment LUE Assessment: Within Functional Limits   Tonny Branch 09/16/2020, 6:57 AM

## 2020-09-16 NOTE — Progress Notes (Signed)
[place] PHYSICAL MEDICINE & REHABILITATION PROGRESS NOTE   Subjective/Complaints: Reports dizziness more with lying to side, changing from sit-lying positions. Felt a little dizzy while talking to me at EOB this morning. Meclizine didn't make much of a difference and only seems to make her sleepy  ROS: Patient denies fever, rash, sore throat, blurred vision, nausea, vomiting, diarrhea, cough, shortness of breath or chest pain, joint or back pain, headache, or mood change.  .    Objective:   No results found. No results for input(s): WBC, HGB, HCT, PLT in the last 72 hours. No results for input(s): NA, K, CL, CO2, GLUCOSE, BUN, CREATININE, CALCIUM in the last 72 hours.  Intake/Output Summary (Last 24 hours) at 09/16/2020 1031 Last data filed at 09/15/2020 1832 Gross per 24 hour  Intake 340 ml  Output --  Net 340 ml        Physical Exam: Vital Signs Blood pressure (!) 121/59, pulse 60, temperature 99.7 F (37.6 C), temperature source Oral, resp. rate 17, height 5' (1.524 m), weight 70.8 kg, SpO2 98 %. Constitutional: No distress . Vital signs reviewed. HEENT: EOMI, oral membranes moist Neck: supple Cardiovascular: RRR without murmur. No JVD    Respiratory/Chest: CTA Bilaterally without wheezes or rales. Normal effort    GI/Abdomen: BS +, non-tender, non-distended Ext: no clubbing, cyanosis, or edema Psych: pleasant and cooperative Skin: Warm and dry.  Craniotomy site closed. Left wrist with dried blisters which are peeling off. Musc: No edema in extremities.  No tenderness in extremities. Neuro: Alert and oriented x 3. Normal insight and awareness. No nystagmus appreciated today but dit not do formal provoc exam. Mild STM deficits.  Normal language and speech. Cranial nerve exam unremarkable-- Motor: Grossly 4+ to 5/5 throughout. Good sitting balance  Assessment/Plan: 1. Functional deficits which require 3+ hours per day of interdisciplinary therapy in a comprehensive  inpatient rehab setting.  Physiatrist is providing close team supervision and 24 hour management of active medical problems listed below.  Physiatrist and rehab team continue to assess barriers to discharge/monitor patient progress toward functional and medical goals  Care Tool:  Bathing    Body parts bathed by patient: Right arm,Left arm,Chest,Abdomen,Front perineal area,Right upper leg,Left upper leg,Face,Buttocks,Right lower leg,Left lower leg   Body parts bathed by helper: Right lower leg,Left lower leg     Bathing assist Assist Level: Set up assist     Upper Body Dressing/Undressing Upper body dressing   What is the patient wearing?: Pull over shirt    Upper body assist Assist Level: Set up assist    Lower Body Dressing/Undressing Lower body dressing      What is the patient wearing?: Incontinence brief,Pants     Lower body assist Assist for lower body dressing: Supervision/Verbal cueing     Toileting Toileting    Toileting assist Assist for toileting: Moderate Assistance - Patient 50 - 74%     Transfers Chair/bed transfer  Transfers assist     Chair/bed transfer assist level: Supervision/Verbal cueing Chair/bed transfer assistive device: Programmer, multimedia   Ambulation assist      Assist level: Contact Guard/Touching assist Assistive device: Walker-rolling Max distance: 50   Walk 10 feet activity   Assist     Assist level: Contact Guard/Touching assist Assistive device: Walker-rolling   Walk 50 feet activity   Assist Walk 50 feet with 2 turns activity did not occur: Safety/medical concerns  Assist level: Contact Guard/Touching assist Assistive device: Walker-rolling    Walk  150 feet activity   Assist Walk 150 feet activity did not occur: Safety/medical concerns         Walk 10 feet on uneven surface  activity   Assist Walk 10 feet on uneven surfaces activity did not occur: Safety/medical concerns          Wheelchair     Assist Will patient use wheelchair at discharge?: Yes Type of Wheelchair: Manual    Wheelchair assist level: Supervision/Verbal cueing Max wheelchair distance: 150    Wheelchair 50 feet with 2 turns activity    Assist        Assist Level: Supervision/Verbal cueing   Wheelchair 150 feet activity     Assist      Assist Level: Supervision/Verbal cueing   Blood pressure (!) 121/59, pulse 60, temperature 99.7 F (37.6 C), temperature source Oral, resp. rate 17, height 5' (1.524 m), weight 70.8 kg, SpO2 98 %.     Medical Problem List and Plan: 1.  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  Status post right frontal parietal craniotomy evacuation of subdural hematoma 08/23/2020  Continue CIR.    -ELOS 1/8---home with family in [place]--on track for dc  -wrote pt letter for [place] jury duty today 2.  Antithrombotics: -DVT/anticoagulation: SCDs             -antiplatelet therapy: N/A 3. Pain Management/fibromyalgia: Lyrica 100 mg twice daily, Elavil 50 mg nightly, hydrocodone as needed  Topamax to 75mg    Increased Amitriptyline dose.   Lidocaine patch at night on R side of neck.   Controlled with meds on 1/7 4. Mood: Provide emotional support             -antipsychotic agents: N/A 5. Neuropsych: This patient is?  Fully capable of making decisions on her own behalf. 6. Skin/Wound Care: Routine skin checks- no further dressing needed for left wrist  7. Fluids/Electrolytes/Nutrition: Routine in and outs 8.  Seizure prophylaxis.  EEG negative.  Keppra 1000 mg twice daily 9.  Hypertension.  Norvasc 10 mg daily,  Monitor with increased mobility Vitals:   09/15/20 1943 09/16/20 0512  BP: 117/61 (!) 121/59  Pulse: 73 60  Resp: 17   Temp: 98.1 F (36.7 C) 99.7 F (37.6 C)  SpO2: 100% 98%   Increased Lopressor to 25mg  TID   Controlled 1/7 10.  Hyperlipidemia: Lipitor 11.  History of PAD.  Patient on aspirin  Plavix prior to admission.  Currently on hold due to SDH 12.  Tobacco abuse.    Counsel 13.  Morbid obesity.  BMI 32.68.  Dietary follow-up 14.  Urinary retention.  Urecholine 10 mg 3 times daily.    Improved 15.  Dysphagia.    Now on regular diet with intermittent supervision.  16. Constipation: schedule Miralax.  17.  Sleep disturbance: Amitriptyline 75mg  HS  Improved 18. Dizziness with standing: appears generally vestibular in nature. vestibular rx per therapy   -acclimation, vestibular exercises. Asked her to f/u with PT today  -safety, equipment education  -dc meclizine as it didn't have much effect 19. Right thumb CMC joint pain: XR and Diclofenac gel ordered. XR shows mild arthritic changes. Diclofenac gel appears to have helped. No c/o   LOS: 15 days A FACE TO FACE EVALUATION WAS PERFORMED  Meredith Staggers 09/16/2020, 10:31 AM

## 2020-09-16 NOTE — Progress Notes (Signed)
Speech Language Pathology Discharge Summary  Patient Details  Name: Amy Bruce MRN: [id number] Date of Birth: 08/28/1951  Today's Date: 09/16/2020 SLP Individual Time: 0725-0825 SLP Individual Time Calculation (min): 60 min   Skilled Therapeutic Interventions:  Skilled treatment session focused on cognitive goals. Upon arrival, patient was asleep with one leg hanging over the side of the bed with her breakfast in front of her (appeared to have fallen asleep while trying to sit EOB). SLP provided verbal and tactile cues for arousal along with extra time for patient to sit EOB. SLP facilitated session by providing supervision level verbal cues for selective attention to self-feeding in a mildly distracting environment. Increased cueing was needed for divided attention between self-feeding and a functional conversation that focused on memory compensatory strategies as patient tends to distract herself at times due to her verbosity. Patient demonstrated understanding and agreement of all education and handouts were given to reinforce information. Patient left upright sitting EOB with alarm on and all needs within reach.   Patient has met 6 of 6 long term goals.  Patient to discharge at overall Supervision level.   Reasons goals not met: N/A   Clinical Impression/Discharge Summary: Patient has made excellent gains and has met 6 of 6 LTGs this reporting period. Currently, patient demonstrates behaviors consistent with a Rancho Level VIII and requires overall supervision level verbal cues to complete functional and mildly complex tasks safely in regards to problem solving, recall, attention and awareness. Patient is also consuming regular textures with thin liquids without overt s/s of aspiration with overall Mod I for use of swallowing compensatory strategies. Patient and family education is complete and patient will discharge home with 24 hour supervision from family. Patient would benefit f/u SLP  services to maximize her cognitive functioning and overall functional independence in order to reduce caregiver burden.   Care Partner:  Caregiver Able to Provide Assistance: Yes  Type of Caregiver Assistance: Physical;Cognitive  Recommendation:  Home Health SLP;24 hour supervision/assistance  Rationale for SLP Follow Up: Reduce caregiver burden;Maximize cognitive function and independence   Equipment: N/A   Reasons for discharge: Discharged from hospital;Treatment goals met   Patient/Family Agrees with Progress Made and Goals Achieved: Yes    PAYNE, COURTNEY 09/16/2020, 6:40 AM    

## 2020-09-16 NOTE — Progress Notes (Signed)
Inpatient Rehabilitation Care Coordinator Discharge Note  The overall goal for the admission was met for: DC SAT 1/8  Discharge location: Yes-GOING TO DAUGHTER'S[place] IN [place] WHERE SHE WILL RECEIVE 24/7 CARE  Length of Stay: Yes-16 DAYS  Discharge activity level: Yes-SUPERVISION-CGA LEVEL  Home/community participation: Yes  Services provided included: MD, RD, PT, OT, SLP, RN, CM, Pharmacy, Neuropsych and SW  Financial Services: Private Insurance: [place] offered to/list presented to:YES  Follow-up services arranged: DME: [facility], TUB BENCH AND 3 IN 1 and Patient/Family has no preference for HH/DME agencies DAUGHTER'S TO GET PC FOR [place]   Comments (or additional information):GOING TO DAUGHTER[facility]S HOME IN [facility] WHERE SHE WILL HAVE 24/7 SUPERVISION LEVEL. PT'S MEDS GOTTEN THROUGH TOC PHARMACY AND WILL NEED TO BE PICKED UP IN THE MAIN PHARMACY TOMORROW DUE TO TOC NOT OPEN ON THE WEEKEND. BEDSIDE RN AWARE OF THIS.  Patient/Family verbalized understanding of follow-up arrangements: Yes  Individual responsible for coordination of the follow-up plan: [facility] [phone]  Confirmed correct DME delivered: Elease Hashimoto 09/16/2020    Maximo Spratling, [place]

## 2020-09-16 NOTE — Progress Notes (Signed)
Patient ID: Amy Bruce, female   DOB: 08-20-1951, 70 y.o.   MRN: [id number] Have informed Tomika-RN of pt's medications will be in the main pharmacy due to [facility] is closed on the weekend. She will be here tomorrow and will make sure pt receives her medications prior to discharge. Pt feels ready to go to [facility] with daughter. Her equipment is in the room ready to go with her.

## 2020-09-16 NOTE — Plan of Care (Signed)
  Problem: Consults Goal: RH BRAIN INJURY PATIENT EDUCATION Description: Description: See Patient Education module for eduction specifics Outcome: Progressing Goal: Skin Care Protocol Initiated - if Braden Score 18 or less Description: If consults are not indicated, leave blank or document N/A Outcome: Progressing   Problem: RH BOWEL ELIMINATION Goal: RH STG MANAGE BOWEL WITH ASSISTANCE Description: STG Manage Bowel with supervision Assistance. Outcome: Progressing Goal: RH STG MANAGE BOWEL W/MEDICATION W/ASSISTANCE Description: STG Manage Bowel with Medication with Assistance. Outcome: Progressing   Problem: RH BLADDER ELIMINATION Goal: RH STG MANAGE BLADDER WITH ASSISTANCE Description: STG Manage Bladder With supervision Assistance Outcome: Progressing   Problem: RH SKIN INTEGRITY Goal: RH STG SKIN FREE OF INFECTION/BREAKDOWN Description: Patient's skin will be free of infections and breakdown with minimal assistance Outcome: Progressing Goal: RH STG MAINTAIN SKIN INTEGRITY WITH ASSISTANCE Description: STG Maintain Skin Integrity With minimal Assistance. Outcome: Progressing   Problem: RH SAFETY Goal: RH STG ADHERE TO SAFETY PRECAUTIONS W/ASSISTANCE/DEVICE Description: STG Adhere to Safety Precautions With minimal Assistance/Device. Outcome: Progressing   Problem: RH COGNITION-NURSING Goal: RH STG USES MEMORY AIDS/STRATEGIES W/ASSIST TO PROBLEM SOLVE Description: STG Uses Memory Aids/Strategies With minimal Assistance to Problem Solve. Outcome: Progressing   Problem: RH PAIN MANAGEMENT Goal: RH STG PAIN MANAGED AT OR BELOW PT'S PAIN GOAL Description: Pain will be managed at or below 4 out of 10 on pain scale with minimal assist Outcome: Progressing   Problem: RH KNOWLEDGE DEFICIT BRAIN INJURY Goal: RH STG INCREASE KNOWLEDGE OF SELF CARE AFTER BRAIN INJURY Description: Patient/family will increase knowledge of self care after brain injury with handouts and additional  education from nursing and therapies with minimal assistance Outcome: Progressing   

## 2020-09-16 NOTE — Progress Notes (Signed)
Physical Therapy Discharge Summary  Patient Details  Name: Amy Bruce MRN: [id number] Date of Birth: 11/29/1950  Today's Date: 09/16/2020 PT Individual Time: 1415-1530 PT Individual Time Calculation (min): 75 min    Patient has met 5 of 10 long term goals due to improved activity tolerance, improved balance, improved postural control, increased strength, ability to compensate for deficits, functional use of  left upper extremity and left lower extremity, improved attention, improved awareness and improved coordination.  Patient to discharge at an ambulatory level Supervision.   Patient's care partner is independent to provide the necessary physical and cognitive assistance at discharge.  Reasons goals not met: Due to patients h/o of TBI, she requires grossly supervision for all functional mobility. She also has current symptoms of vertigo related to positional changes, therefore requiring supervision for safety as well.   Recommendation:  Patient will benefit from ongoing skilled PT services in home health setting to continue to advance safe functional mobility, address ongoing impairments in safety awareness, dynamic/ambulatory balance, balance strategies, stair negotiation, endurance and minimize fall risk.  Equipment: RW  Reasons for discharge: discharge from hospital  Patient/family agrees with progress made and goals achieved: Yes  PT Discharge Precautions/Restrictions Precautions Precautions: Fall Restrictions Weight Bearing Restrictions: No Vision/Perception  Perception Perception: Within Functional Limits Praxis Praxis: Intact  Cognition Overall Cognitive Status: Impaired/Different from baseline Arousal/Alertness: Awake/alert Orientation Level: Oriented X4 Focused Attention: Appears intact Sustained Attention: Appears intact Selective Attention: Impaired Memory: Impaired Awareness: Impaired Problem Solving: Impaired Safety/Judgment: Appears intact [place] Scales of Cognitive Functioning: Purposeful/appropriate Sensation Sensation Light Touch: Appears Intact Hot/Cold: Appears Intact Proprioception: Appears Intact Stereognosis: Appears Intact Coordination Gross Motor Movements are Fluid and Coordinated: Yes Fine Motor Movements are Fluid and Coordinated: No Motor  Motor Motor: Within Functional Limits  Mobility Bed Mobility Bed Mobility: Rolling Left;Rolling Right;Sit to Supine;Supine to Sit;Sitting - Scoot to Edge of Bed Rolling Right: Independent with assistive device Rolling Left: Independent with assistive device Supine to Sit: Supervision/Verbal cueing Sitting - Scoot to Edge of Bed: Supervision/Verbal cueing Sit to Supine: Supervision/Verbal cueing Transfers Transfers: Sit to Stand;Stand Pivot Transfers;Stand to Sit Sit to Stand: Supervision/Verbal cueing Stand to Sit: Supervision/Verbal cueing Stand Pivot Transfers: Supervision/Verbal cueing Stand Pivot Transfer Details: Verbal cues for safe use of DME/AE;Verbal cues for precautions/safety Locomotion  Gait Ambulation: Yes Gait Assistance: Supervision/Verbal cueing Gait Distance (Feet): 150 Feet Assistive device: Rolling walker Gait Assistance Details: Verbal cues for precautions/safety;Verbal cues for safe use of DME/AE;Verbal cues for technique;Verbal cues for gait pattern Gait Gait: Yes Gait Pattern: Impaired Gait Pattern: Step-through pattern;Decreased step length - right;Decreased step length - left;Narrow base of support;Trunk flexed Gait velocity: decreased Stairs / Additional Locomotion Stairs: Yes Stairs Assistance: Contact Guard/Touching assist Stair Management Technique: Two rails Number of Stairs: 4 Height of Stairs: 6 Ramp: Contact Guard/touching assist Curb: Contact Guard/Touching assist Wheelchair Mobility Wheelchair Mobility: Yes Wheelchair Assistance: Supervision/Verbal cueing Wheelchair Propulsion: Both upper extremities Wheelchair Parts  Management: Supervision/cueing Distance: 150  Trunk/Postural Assessment  Cervical Assessment Cervical Assessment: Exceptions to WFL (forward head) Thoracic Assessment Thoracic Assessment: Exceptions to WFL (increased kyphosis) Lumbar Assessment Lumbar Assessment: Exceptions to WFL (posterior pelvic tilt) Postural Control Postural Control: Deficits on evaluation Righting Reactions: delayed and inadequate Protective Responses: delayed and inadequate  Balance Balance Balance Assessed: Yes Standardized Balance Assessment Standardized Balance Assessment: Berg Balance Test;Timed Up and Go Test (5x STS (c UE): 17.5) Berg Balance Test Sit to Stand: Able to stand  independently using hands Standing Unsupported:   Able to stand 2 minutes with supervision Sitting with Back Unsupported but Feet Supported on Floor or Stool: Able to sit safely and securely 2 minutes Stand to Sit: Controls descent by using hands Transfers: Able to transfer safely, definite need of hands Standing Unsupported with Eyes Closed: Able to stand 10 seconds with supervision Standing Ubsupported with Feet Together: Able to place feet together independently but unable to hold for 30 seconds From Standing, Reach Forward with Outstretched Arm: Reaches forward but needs supervision From Standing Position, Pick up Object from Floor: Unable to try/needs assist to keep balance From Standing Position, Turn to Look Behind Over each Shoulder: Needs supervision when turning Turn 360 Degrees: Needs assistance while turning Standing Unsupported, Alternately Place Feet on Step/Stool: Able to complete >2 steps/needs minimal assist Standing Unsupported, One Foot in Front: Needs help to step but can hold 15 seconds Standing on One Leg: Unable to try or needs assist to prevent fall Total Score: 25 Timed Up and Go Test TUG: Normal TUG Normal TUG (seconds): 37.68 Static Sitting Balance Static Sitting - Balance Support: Feet  supported Static Sitting - Level of Assistance: 7: Independent Dynamic Sitting Balance Dynamic Sitting - Balance Support: During functional activity Dynamic Sitting - Level of Assistance: 5: Stand by assistance Static Standing Balance Static Standing - Balance Support: Bilateral upper extremity supported Static Standing - Level of Assistance: 5: Stand by assistance Dynamic Standing Balance Dynamic Standing - Balance Support: During functional activity Dynamic Standing - Level of Assistance: 5: Stand by assistance Extremity Assessment      RLE Strength Right Hip Flexion: 4-/5 Right Hip ABduction: 4/5 Right Hip ADduction: 4/5 Right Knee Flexion: 4/5 Right Knee Extension: 4/5 Right Ankle Dorsiflexion: 4/5 Right Ankle Plantar Flexion: 4/5 LLE Assessment LLE Assessment: Exceptions to [facility] LLE Strength Left Hip Flexion: 4/5 Left Hip ABduction: 4/5 Left Hip ADduction: 4/5 Left Knee Flexion: 4/5 Left Knee Extension: 4/5 Left Ankle Dorsiflexion: 4/5 Left Ankle Plantar Flexion: 4/5  DAILY SESSION: Patient received sitting up in bed, agreeable to PT. She denies pain, but reports increase in dizziness with positional changes. Unable to formally assess for BPPV because patient unable to keep her eyes open for accurate assessment. She was able to come sit edge of bed ModI using bed rails. Transferring to wc via stand pivot with supervision. Patient able to propel herself in wc with supervision 118f to gym. Patient completing BMerrilee Janskybalance scale scoring a 25/56 indicating increased risk for falling and benefit from use of AD. She demonstrates minimal, if any, protective reflexes to LOB. She was able to complete TUG in 37.6s using RW and supervision and 5xSTS with B UE support and 17.5s. Patient demonstrating Caretool components as noted. Supervision to ambulate to bathroom. Patient incontinent of bladder, but continent of bowel with supervision for perihygiene and clothing management in standing.  Patient returning to bed, bed alarm on, call light within reach.   JDebbora Dus1/03/2021, 2:58 PM

## 2020-09-17 DIAGNOSIS — S065X2S Traumatic subdural hemorrhage with loss of consciousness of 31 minutes to 59 minutes, sequela: Secondary | ICD-10-CM

## 2020-09-17 NOTE — Progress Notes (Signed)
Patient discharged to home with daughter. Medication was signed for by daughter. Patient and daughter stated that they understood all discharge information given. Sanda Linger, LPN

## 2020-09-17 NOTE — Plan of Care (Signed)
  Problem: Consults Goal: RH BRAIN INJURY PATIENT EDUCATION Description: Description: See Patient Education module for eduction specifics 09/17/2020 1720 by Renda Rolls L, LPN Outcome: Completed/Met 09/17/2020 1143 by Renda Rolls L, LPN Outcome: Progressing Goal: Skin Care Protocol Initiated - if Braden Score 18 or less Description: If consults are not indicated, leave blank or document N/A 09/17/2020 1720 by Renda Rolls L, LPN Outcome: Completed/Met 09/17/2020 1143 by Renda Rolls L, LPN Outcome: Progressing   Problem: RH BOWEL ELIMINATION Goal: RH STG MANAGE BOWEL WITH ASSISTANCE Description: STG Manage Bowel with supervision Assistance. 09/17/2020 1720 by Renda Rolls L, LPN Outcome: Completed/Met 09/17/2020 1143 by Sanda Linger, LPN Outcome: Progressing Goal: RH STG MANAGE BOWEL W/MEDICATION W/ASSISTANCE Description: STG Manage Bowel with Medication with Assistance. 09/17/2020 1720 by Renda Rolls L, LPN Outcome: Completed/Met 09/17/2020 1143 by Renda Rolls L, LPN Outcome: Progressing   Problem: RH BLADDER ELIMINATION Goal: RH STG MANAGE BLADDER WITH ASSISTANCE Description: STG Manage Bladder With supervision Assistance 09/17/2020 1720 by Renda Rolls L, LPN Outcome: Completed/Met 09/17/2020 1143 by Renda Rolls L, LPN Outcome: Progressing   Problem: RH SKIN INTEGRITY Goal: RH STG SKIN FREE OF INFECTION/BREAKDOWN Description: Patient's skin will be free of infections and breakdown with minimal assistance 09/17/2020 1720 by Renda Rolls L, LPN Outcome: Completed/Met 09/17/2020 1143 by Renda Rolls L, LPN Outcome: Progressing Goal: RH STG MAINTAIN SKIN INTEGRITY WITH ASSISTANCE Description: STG Maintain Skin Integrity With minimal Assistance. 09/17/2020 1720 by Renda Rolls L, LPN Outcome: Completed/Met 09/17/2020 1143 by Renda Rolls L, LPN Outcome: Progressing   Problem: RH SAFETY Goal: RH STG ADHERE TO SAFETY PRECAUTIONS W/ASSISTANCE/DEVICE Description: STG Adhere to Safety  Precautions With minimal Assistance/Device. 09/17/2020 1720 by Renda Rolls L, LPN Outcome: Completed/Met 09/17/2020 1143 by Renda Rolls L, LPN Outcome: Progressing   Problem: RH COGNITION-NURSING Goal: RH STG USES MEMORY AIDS/STRATEGIES W/ASSIST TO PROBLEM SOLVE Description: STG Uses Memory Aids/Strategies With minimal Assistance to Problem Solve. 09/17/2020 1720 by Renda Rolls L, LPN Outcome: Completed/Met 09/17/2020 1143 by Renda Rolls L, LPN Outcome: Progressing   Problem: RH PAIN MANAGEMENT Goal: RH STG PAIN MANAGED AT OR BELOW PT'S PAIN GOAL Description: Pain will be managed at or below 4 out of 10 on pain scale with minimal assist 09/17/2020 1720 by Renda Rolls L, LPN Outcome: Completed/Met 09/17/2020 1143 by Renda Rolls L, LPN Outcome: Progressing   Problem: RH KNOWLEDGE DEFICIT BRAIN INJURY Goal: RH STG INCREASE KNOWLEDGE OF SELF CARE AFTER BRAIN INJURY Description: Patient/family will increase knowledge of self care after brain injury with handouts and additional education from nursing and therapies with minimal assistance 09/17/2020 1720 by Renda Rolls L, LPN Outcome: Completed/Met 09/17/2020 1143 by Renda Rolls L, LPN Outcome: Progressing

## 2020-09-17 NOTE — Plan of Care (Signed)
  Problem: Consults Goal: RH BRAIN INJURY PATIENT EDUCATION Description: Description: See Patient Education module for eduction specifics Outcome: Progressing Goal: Skin Care Protocol Initiated - if Braden Score 18 or less Description: If consults are not indicated, leave blank or document N/A Outcome: Progressing   Problem: RH BOWEL ELIMINATION Goal: RH STG MANAGE BOWEL WITH ASSISTANCE Description: STG Manage Bowel with supervision Assistance. Outcome: Progressing Goal: RH STG MANAGE BOWEL W/MEDICATION W/ASSISTANCE Description: STG Manage Bowel with Medication with Assistance. Outcome: Progressing   Problem: RH BLADDER ELIMINATION Goal: RH STG MANAGE BLADDER WITH ASSISTANCE Description: STG Manage Bladder With supervision Assistance Outcome: Progressing   Problem: RH SKIN INTEGRITY Goal: RH STG SKIN FREE OF INFECTION/BREAKDOWN Description: Patient's skin will be free of infections and breakdown with minimal assistance Outcome: Progressing Goal: RH STG MAINTAIN SKIN INTEGRITY WITH ASSISTANCE Description: STG Maintain Skin Integrity With minimal Assistance. Outcome: Progressing   Problem: RH SAFETY Goal: RH STG ADHERE TO SAFETY PRECAUTIONS W/ASSISTANCE/DEVICE Description: STG Adhere to Safety Precautions With minimal Assistance/Device. Outcome: Progressing   Problem: RH COGNITION-NURSING Goal: RH STG USES MEMORY AIDS/STRATEGIES W/ASSIST TO PROBLEM SOLVE Description: STG Uses Memory Aids/Strategies With minimal Assistance to Problem Solve. Outcome: Progressing   Problem: RH PAIN MANAGEMENT Goal: RH STG PAIN MANAGED AT OR BELOW PT'S PAIN GOAL Description: Pain will be managed at or below 4 out of 10 on pain scale with minimal assist Outcome: Progressing   Problem: RH KNOWLEDGE DEFICIT BRAIN INJURY Goal: RH STG INCREASE KNOWLEDGE OF SELF CARE AFTER BRAIN INJURY Description: Patient/family will increase knowledge of self care after brain injury with handouts and additional  education from nursing and therapies with minimal assistance Outcome: Progressing

## 2020-09-17 NOTE — Progress Notes (Signed)
Pt called to nurses station in regards to a vomiting episode. RN found pt on side of bed with vomit on the floor. Pt stated, "I just woke up and vomited." RN cleaned up and called EVS. RN administered zofran tablet and took vitals. RN will continue to monitor. Pt in no acute distress.

## 2020-09-17 NOTE — Progress Notes (Signed)
[facility] PHYSICAL MEDICINE & REHABILITATION PROGRESS NOTE   Subjective/Complaints: Experienced n/v this morning after she awoke. No further sx when I saw her an hour later. Still with intermittent dizziness  ROS: Patient denies fever, rash, sore throat, blurred vision,   diarrhea, cough, shortness of breath or chest pain, joint or back pain, headache, or mood change.      Objective:   No results found. No results for input(s): WBC, HGB, HCT, PLT in the last 72 hours. No results for input(s): NA, K, CL, CO2, GLUCOSE, BUN, CREATININE, CALCIUM in the last 72 hours.  Intake/Output Summary (Last 24 hours) at 09/17/2020 0828 Last data filed at 09/16/2020 1820 Gross per 24 hour  Intake 480 ml  Output --  Net 480 ml        Physical Exam: Vital Signs Blood pressure (!) 121/52, pulse 75, temperature 98.2 F (36.8 C), resp. rate 17, height 5' (1.524 m), weight 70.8 kg, SpO2 100 %. Constitutional: No distress . Vital signs reviewed. HEENT: EOMI, oral membranes moist Neck: supple Cardiovascular: RRR without murmur. No JVD    Respiratory/Chest: CTA Bilaterally without wheezes or rales. Normal effort    GI/Abdomen: BS +, non-tender, non-distended Ext: no clubbing, cyanosis, or edema Psych: pleasant and cooperative Skin: Warm and dry.  Craniotomy site closed. Left wrist healing nicely. Musc: No edema in extremities.  No tenderness in extremities. Neuro: Alert and oriented x 3. Normal insight and awareness. No nystagmus appreciated today but dit not do formal provoc exam. Mild STM deficits.  Normal language and speech. Cranial nerve exam unremarkable-- Motor: Grossly  5/5 throughout.     Assessment/Plan: 1. Functional deficits which require 3+ hours per day of interdisciplinary therapy in a comprehensive inpatient rehab setting.  Physiatrist is providing close team supervision and 24 hour management of active medical problems listed below.  Physiatrist and rehab team continue to  assess barriers to discharge/monitor patient progress toward functional and medical goals  Care Tool:  Bathing    Body parts bathed by patient: Right arm,Left arm,Chest,Abdomen,Front perineal area,Right upper leg,Left upper leg,Face,Buttocks,Right lower leg,Left lower leg   Body parts bathed by helper: Right lower leg,Left lower leg     Bathing assist Assist Level: Set up assist     Upper Body Dressing/Undressing Upper body dressing   What is the patient wearing?: Pull over shirt    Upper body assist Assist Level: Set up assist    Lower Body Dressing/Undressing Lower body dressing      What is the patient wearing?: Incontinence brief,Pants     Lower body assist Assist for lower body dressing: Supervision/Verbal cueing     Toileting Toileting    Toileting assist Assist for toileting: Moderate Assistance - Patient 50 - 74%     Transfers Chair/bed transfer  Transfers assist     Chair/bed transfer assist level: Supervision/Verbal cueing Chair/bed transfer assistive device: Programmer, multimedia   Ambulation assist      Assist level: Supervision/Verbal cueing Assistive device: Walker-rolling Max distance: 150   Walk 10 feet activity   Assist     Assist level: Supervision/Verbal cueing Assistive device: Walker-rolling   Walk 50 feet activity   Assist Walk 50 feet with 2 turns activity did not occur: Safety/medical concerns  Assist level: Supervision/Verbal cueing Assistive device: Walker-rolling    Walk 150 feet activity   Assist Walk 150 feet activity did not occur: Safety/medical concerns  Assist level: Supervision/Verbal cueing Assistive device: Walker-rolling    Walk 10  feet on uneven surface  activity   Assist Walk 10 feet on uneven surfaces activity did not occur: Safety/medical concerns   Assist level: Contact Guard/Touching assist Assistive device: Aeronautical engineer Will patient use  wheelchair at discharge?: Yes Type of Wheelchair: Manual    Wheelchair assist level: Supervision/Verbal cueing Max wheelchair distance: 150    Wheelchair 50 feet with 2 turns activity    Assist        Assist Level: Supervision/Verbal cueing   Wheelchair 150 feet activity     Assist      Assist Level: Supervision/Verbal cueing   Blood pressure (!) 121/52, pulse 75, temperature 98.2 F (36.8 C), resp. rate 17, height 5' (1.524 m), weight 70.8 kg, SpO2 100 %.     Medical Problem List and Plan: 1.  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  Status post right frontal parietal craniotomy evacuation of subdural hematoma 08/23/2020  Continue CIR.    -ELOS 1/8---home with family in [place]--   -wrote pt letter for February jury duty which I gave her this morning 2.  Antithrombotics: -DVT/anticoagulation: SCDs             -antiplatelet therapy: N/A 3. Pain Management/fibromyalgia: Lyrica 100 mg twice daily, Elavil 50 mg nightly, hydrocodone as needed  Topamax to 75mg    Increased Amitriptyline dose.   Lidocaine patch at night on R side of neck.   Controlled with meds on 1/8 4. Mood: Provide emotional support             -antipsychotic agents: N/A 5. Neuropsych: This patient is?  Fully capable of making decisions on her own behalf. 6. Skin/Wound Care: Routine skin checks- no further dressing needed for left wrist  7. Fluids/Electrolytes/Nutrition: Routine in and outs 8.  Seizure prophylaxis.  EEG negative.  Keppra 1000 mg twice daily 9.  Hypertension.  Norvasc 10 mg daily,  Monitor with increased mobility Vitals:   09/16/20 1912 09/17/20 0507  BP: (!) 114/56 (!) 121/52  Pulse: 78 75  Resp: 17   Temp: 98.2 F (36.8 C) 98.2 F (36.8 C)  SpO2: 100% 100%   Increased Lopressor to 25mg  TID   Controlled 1/8 10.  Hyperlipidemia: Lipitor 11.  History of PAD.  Patient on aspirin Plavix prior to admission.  Currently on hold due to SDH 12.   Tobacco abuse.    Counsel 13.  Morbid obesity.  BMI 32.68.  Dietary follow-up 14.  Urinary retention.  Urecholine 10 mg 3 times daily.    Improved 15.  Dysphagia.    Now on regular diet with intermittent supervision.  16. Constipation: schedule Miralax.  17.  Sleep disturbance: Amitriptyline 75mg  HS  Improved 18. Dizziness with standing: appears generally vestibular in nature. vestibular rx per therapy   -acclimation, vestibular exercises.   -safety, equipment education  -may use zofran for periodic n/v  -encouraged pt to stick with her vestibular exercises at home. She discussed with PT yesterday 19. Right thumb CMC joint pain: XR and Diclofenac gel ordered. XR shows mild arthritic changes. Diclofenac gel appears to have helped. No c/o   LOS: 16 days A FACE TO FACE EVALUATION WAS PERFORMED  Meredith Staggers 09/17/2020, 8:28 AM

## 2020-12-19 ENCOUNTER — Ambulatory Visit
Admission: RE | Admit: 2020-12-19 | Discharge: 2020-12-19 | Disposition: A | Discharge status: Home / Self Care | Payer: Medicare Other | Source: Ambulatory Visit | Attending: Physician Assistant | Admitting: Physician Assistant

## 2020-12-19 ENCOUNTER — Other Ambulatory Visit: Payer: Self-pay | Admitting: Physician Assistant

## 2020-12-19 DIAGNOSIS — S065X9A Traumatic subdural hemorrhage with loss of consciousness of unspecified duration, initial encounter: Secondary | ICD-10-CM

## 2020-12-19 DIAGNOSIS — S065XAA Traumatic subdural hemorrhage with loss of consciousness status unknown, initial encounter: Secondary | ICD-10-CM

## 2020-12-19 DIAGNOSIS — S065X0A Traumatic subdural hemorrhage without loss of consciousness, initial encounter: Secondary | ICD-10-CM | POA: Diagnosis not present

## 2020-12-19 DIAGNOSIS — G319 Degenerative disease of nervous system, unspecified: Secondary | ICD-10-CM | POA: Diagnosis not present

## 2020-12-19 DIAGNOSIS — Z9889 Other specified postprocedural states: Secondary | ICD-10-CM | POA: Diagnosis not present

## 2020-12-19 DIAGNOSIS — Z8679 Personal history of other diseases of the circulatory system: Secondary | ICD-10-CM | POA: Diagnosis not present

## 2020-12-20 ENCOUNTER — Ambulatory Visit (HOSPITAL_COMMUNITY)
Admission: RE | Admit: 2020-12-20 | Payer: Medicare Other | Source: Ambulatory Visit | Attending: Cardiovascular Disease | Admitting: Cardiovascular Disease

## 2020-12-21 ENCOUNTER — Encounter (HOSPITAL_COMMUNITY): Payer: Self-pay

## 2020-12-29 ENCOUNTER — Other Ambulatory Visit: Payer: Self-pay | Admitting: Physical Medicine & Rehabilitation

## 2020-12-30 DIAGNOSIS — S065X9D Traumatic subdural hemorrhage with loss of consciousness of unspecified duration, subsequent encounter: Secondary | ICD-10-CM | POA: Diagnosis not present

## 2020-12-30 DIAGNOSIS — Z7902 Long term (current) use of antithrombotics/antiplatelets: Secondary | ICD-10-CM | POA: Diagnosis not present

## 2020-12-30 DIAGNOSIS — I739 Peripheral vascular disease, unspecified: Secondary | ICD-10-CM | POA: Diagnosis not present

## 2020-12-30 DIAGNOSIS — I10 Essential (primary) hypertension: Secondary | ICD-10-CM | POA: Diagnosis not present

## 2020-12-30 DIAGNOSIS — W19XXXD Unspecified fall, subsequent encounter: Secondary | ICD-10-CM | POA: Diagnosis not present

## 2021-01-02 ENCOUNTER — Other Ambulatory Visit: Payer: Self-pay

## 2021-01-02 ENCOUNTER — Encounter: Payer: Medicare Other | Attending: Physical Medicine & Rehabilitation | Admitting: Physical Medicine & Rehabilitation

## 2021-01-02 ENCOUNTER — Encounter: Payer: Self-pay | Admitting: Physical Medicine & Rehabilitation

## 2021-01-02 VITALS — BP 145/84 | HR 100 | Temp 98.9°F | Ht 60.0 in | Wt 146.2 lb

## 2021-01-02 DIAGNOSIS — M19049 Primary osteoarthritis, unspecified hand: Secondary | ICD-10-CM | POA: Diagnosis not present

## 2021-01-02 DIAGNOSIS — M792 Neuralgia and neuritis, unspecified: Secondary | ICD-10-CM | POA: Insufficient documentation

## 2021-01-02 DIAGNOSIS — G894 Chronic pain syndrome: Secondary | ICD-10-CM | POA: Diagnosis not present

## 2021-01-02 DIAGNOSIS — R000 Tachycardia, unspecified: Secondary | ICD-10-CM | POA: Diagnosis not present

## 2021-01-02 MED ORDER — TIZANIDINE HCL 4 MG PO TABS: 2.0000 mg | ORAL_TABLET | Freq: Two times a day (BID) | ORAL | 2 refills | Status: DC | PRN

## 2021-01-02 MED ORDER — TOPIRAMATE 100 MG PO TABS: 100.0000 mg | ORAL_TABLET | Freq: Two times a day (BID) | ORAL | 2 refills | Status: DC

## 2021-01-02 MED ORDER — MIRABEGRON ER 25 MG PO TB24: 25.0000 mg | ORAL_TABLET | Freq: Every day | ORAL | 2 refills | Status: DC
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
## 2021-01-10 ENCOUNTER — Other Ambulatory Visit: Payer: Self-pay | Admitting: Physical Medicine & Rehabilitation

## 2021-01-19 DIAGNOSIS — W19XXXD Unspecified fall, subsequent encounter: Secondary | ICD-10-CM | POA: Diagnosis not present

## 2021-01-19 DIAGNOSIS — S065X9D Traumatic subdural hemorrhage with loss of consciousness of unspecified duration, subsequent encounter: Secondary | ICD-10-CM | POA: Diagnosis not present

## 2021-01-19 DIAGNOSIS — Z7902 Long term (current) use of antithrombotics/antiplatelets: Secondary | ICD-10-CM | POA: Diagnosis not present

## 2021-01-19 DIAGNOSIS — I739 Peripheral vascular disease, unspecified: Secondary | ICD-10-CM | POA: Diagnosis not present

## 2021-01-19 DIAGNOSIS — I10 Essential (primary) hypertension: Secondary | ICD-10-CM | POA: Diagnosis not present

## 2021-01-25 ENCOUNTER — Other Ambulatory Visit (HOSPITAL_COMMUNITY): Payer: Self-pay | Admitting: Cardiovascular Disease

## 2021-01-25 ENCOUNTER — Ambulatory Visit (HOSPITAL_COMMUNITY)
Admission: RE | Admit: 2021-01-25 | Discharge: 2021-01-25 | Disposition: A | Discharge status: Home / Self Care | Payer: Medicare Other | Source: Ambulatory Visit | Attending: Cardiology | Admitting: Cardiology

## 2021-01-25 ENCOUNTER — Other Ambulatory Visit: Payer: Self-pay

## 2021-01-25 ENCOUNTER — Other Ambulatory Visit (HOSPITAL_COMMUNITY): Payer: Self-pay | Admitting: Psychiatry

## 2021-01-25 DIAGNOSIS — Z95820 Peripheral vascular angioplasty status with implants and grafts: Secondary | ICD-10-CM

## 2021-01-25 DIAGNOSIS — I739 Peripheral vascular disease, unspecified: Secondary | ICD-10-CM | POA: Insufficient documentation

## 2021-01-25 DIAGNOSIS — Z9862 Peripheral vascular angioplasty status: Secondary | ICD-10-CM

## 2021-01-28 ENCOUNTER — Other Ambulatory Visit: Payer: Self-pay | Admitting: Physical Medicine & Rehabilitation

## 2021-02-01 ENCOUNTER — Encounter: Payer: Self-pay | Admitting: *Deleted

## 2021-02-14 ENCOUNTER — Telehealth: Payer: Self-pay | Admitting: Physical Medicine & Rehabilitation

## 2021-02-14 NOTE — Telephone Encounter (Signed)
Received message that patient left a request for H&P completed in last 30 days at office.  Patient has not been seen in office since [phone]-- cannot have dr patel complete as he would not have seen her within last 30 days when document was delivered-  and he is out of country for 2 months

## 2021-02-14 NOTE — Progress Notes (Signed)
I called Dr. Jule Ser Patel's office and left message on their voicemail informing them pt is scheduled for a MRI with Anesthesia (that Dr. Posey Pronto ordered) and we need a H&P that has been done within the last 30 days. None found in Epic or Care Everywhere. I asked that they call me back today or if they don't get the message until tomorrow, to call [phone] and ask for Ileene Hutchinson, [place].

## 2021-02-16 ENCOUNTER — Ambulatory Visit (HOSPITAL_COMMUNITY): Admission: RE | Admit: 2021-02-16 | Payer: Medicare Other | Source: Ambulatory Visit

## 2021-02-16 ENCOUNTER — Ambulatory Visit: Admit: 2021-02-16 | Payer: Medicare Other

## 2021-02-16 SURGERY — MRI WITH ANESTHESIA
Anesthesia: General

## 2021-02-22 ENCOUNTER — Encounter: Payer: Self-pay | Admitting: Registered Nurse

## 2021-02-22 ENCOUNTER — Other Ambulatory Visit: Payer: Self-pay

## 2021-02-22 ENCOUNTER — Encounter: Payer: Medicare Other | Attending: Physical Medicine & Rehabilitation | Admitting: Registered Nurse

## 2021-02-22 VITALS — BP 162/78 | HR 86 | Temp 98.5°F | Ht 60.0 in | Wt 151.0 lb

## 2021-02-22 DIAGNOSIS — M5412 Radiculopathy, cervical region: Secondary | ICD-10-CM | POA: Diagnosis not present

## 2021-02-22 DIAGNOSIS — R200 Anesthesia of skin: Secondary | ICD-10-CM | POA: Insufficient documentation

## 2021-02-22 DIAGNOSIS — M5416 Radiculopathy, lumbar region: Secondary | ICD-10-CM | POA: Insufficient documentation

## 2021-02-22 DIAGNOSIS — R202 Paresthesia of skin: Secondary | ICD-10-CM | POA: Diagnosis not present

## 2021-02-22 DIAGNOSIS — M62838 Other muscle spasm: Secondary | ICD-10-CM | POA: Diagnosis not present

## 2021-02-22 DIAGNOSIS — G894 Chronic pain syndrome: Secondary | ICD-10-CM | POA: Diagnosis not present

## 2021-02-22 DIAGNOSIS — M542 Cervicalgia: Secondary | ICD-10-CM | POA: Diagnosis not present

## 2021-02-22 MED ORDER — AMITRIPTYLINE HCL 50 MG PO TABS: 50.0000 mg | ORAL_TABLET | Freq: Every day | ORAL | 2 refills | Status: DC

## 2021-02-22 MED ORDER — TOPIRAMATE 100 MG PO TABS: 100.0000 mg | ORAL_TABLET | Freq: Two times a day (BID) | ORAL | 2 refills | Status: DC

## 2021-02-22 NOTE — H&P (Signed)
Subjective:    Patient ID: Amy Bruce, female    DOB: [birth year], 70 y.o.   MRN: [id number]  HPI: Amy Bruce is a 70 y.o. female who returns for follow up appointment for chronic pain and medication refill. She states her  pain is located in her neck radiating into her bilateral shoulders, lower back pain radiating into her left hip and reports numbness and tingling into her right hand. .She rates  her pain 9. Her current exercise regime is walking.    Pain Inventory Average Pain 9 Pain Right Now 9 My pain is sharp, stabbing, tingling, and aching  In the last 24 hours, has pain interfered with the following? General activity 8 Relation with others 8 Enjoyment of life 8 What TIME of day is your pain at its worst? morning , daytime, evening, and night Sleep (in general) Good  Pain is worse with: walking, bending, sitting, inactivity, standing, and some activites Pain improves with: rest Relief from Meds:  na  Family History  Problem Relation Age of Onset   Ovarian cancer Mother    Social History   Socioeconomic History   Marital status: Divorced    Spouse name: Not on file   Number of children: Not on file   Years of education: Not on file   Highest education level: Not on file  Occupational History   Not on file  Tobacco Use   Smoking status: Former    Packs/day: 1.00    Years: 33.00    Pack years: 33.00    Types: Cigarettes   Smokeless tobacco: Never   Tobacco comments:    2 ciggs a day  Vaping Use   Vaping Use: Never used  Substance and Sexual Activity   Alcohol use: Not Currently    Alcohol/week: 2.0 standard drinks    Types: 2 Shots of liquor per week    Comment: socially   Drug use: No   Sexual activity: Not on file  Other Topics Concern   Not on file  Social History Narrative   Not on file   Social Determinants of Health   Financial Resource Strain: Not on file  Food Insecurity: Not on file  Transportation Needs: Not on file  Physical  Activity: Not on file  Stress: Not on file  Social Connections: Not on file   Past Surgical History:  Procedure Laterality Date   ABDOMINAL AORTOGRAM W/LOWER EXTREMITY N/A 04/02/2018   Procedure: ABDOMINAL AORTOGRAM W/LOWER EXTREMITY;  Surgeon: Wellington Hampshire, MD;  Location: [place] [facility];  Service: Cardiovascular;  Laterality: N/A;   ABDOMINAL AORTOGRAM W/LOWER EXTREMITY N/A 09/23/2019   Procedure: ABDOMINAL AORTOGRAM W/LOWER EXTREMITY;  Surgeon: Wellington Hampshire, MD;  Location: [place] [facility];  Service: Cardiovascular;  Laterality: N/A;   ABDOMINAL HYSTERECTOMY  1999   CERVICAL SPINE SURGERY  [id number]   C5/6 6/7   COLONOSCOPY     CRANIOTOMY Right 08/23/2020   Procedure: RIGHT CRANIOTOMY HEMATOMA EVACUATION SUBDURAL;  Surgeon: Consuella Lose, MD;  Location: [place];  Service: Neurosurgery;  Laterality: Right;   ECTOPIC PREGNANCY SURGERY     Left axillary cyst removal 1989     MULTIPLE EXTRACTIONS WITH ALVEOLOPLASTY N/A 10/25/2017   Procedure: EXTRACTION NUMBERS FIVE, SEVEN, EIGHT, NINE, TEN, FIFTEEN, EIGHTEEN WITH ALVEOLOPLASTY, IRRIGATION AND DEBRIDEMENT LEFT SUBMANDIBULAR INFECTION;  Surgeon: Diona Browner, DDS;  Location: [place];  Service: Oral Surgery;  Laterality: N/A;   PERIPHERAL VASCULAR ATHERECTOMY Right 09/23/2019   Procedure: PERIPHERAL VASCULAR ATHERECTOMY;  Surgeon: Wellington Hampshire, MD;  Location: [place] [facility];  Service: Cardiovascular;  Laterality: Right;  SFA   PERIPHERAL VASCULAR BALLOON ANGIOPLASTY Right 09/23/2019   Procedure: PERIPHERAL VASCULAR BALLOON ANGIOPLASTY;  Surgeon: Wellington Hampshire, MD;  Location: [place] [facility];  Service: Cardiovascular;  Laterality: Right;  SFA   RADIOLOGY WITH ANESTHESIA N/A 11/18/2018   Procedure: MRI OF THE CERVICAL SPINE WITHOUT CONTRAST;  Surgeon: Radiologist, Medication, MD;  Location: [place];  Service: Radiology;  Laterality: N/A;   RADIOLOGY WITH ANESTHESIA N/A 12/03/2019   Procedure: MRI WITH ANESTHESIA CERVICAL  WITH AND WITHOUT CONTRAST;  Surgeon: Radiologist, Medication, MD;  Location: [place];  Service: Radiology;  Laterality: N/A;   TONSILECTOMY, ADENOIDECTOMY, BILATERAL MYRINGOTOMY AND TUBES  1981   put not aware of tubes being put in [place]   Past Surgical History:  Procedure Laterality Date   ABDOMINAL AORTOGRAM W/LOWER EXTREMITY N/A 04/02/2018   Procedure: ABDOMINAL AORTOGRAM W/LOWER EXTREMITY;  Surgeon: Wellington Hampshire, MD;  Location: [place] [facility];  Service: Cardiovascular;  Laterality: N/A;   ABDOMINAL AORTOGRAM W/LOWER EXTREMITY N/A 09/23/2019   Procedure: ABDOMINAL AORTOGRAM W/LOWER EXTREMITY;  Surgeon: Wellington Hampshire, MD;  Location: [place] [facility];  Service: Cardiovascular;  Laterality: N/A;   ABDOMINAL HYSTERECTOMY  1999   CERVICAL SPINE SURGERY  [id number]   C5/6 6/7   COLONOSCOPY     CRANIOTOMY Right 08/23/2020   Procedure: RIGHT CRANIOTOMY HEMATOMA EVACUATION SUBDURAL;  Surgeon: Consuella Lose, MD;  Location: [place];  Service: Neurosurgery;  Laterality: Right;   ECTOPIC PREGNANCY SURGERY     Left axillary cyst removal 1989     MULTIPLE EXTRACTIONS WITH ALVEOLOPLASTY N/A 10/25/2017   Procedure: EXTRACTION NUMBERS FIVE, SEVEN, EIGHT, NINE, TEN, FIFTEEN, EIGHTEEN WITH ALVEOLOPLASTY, IRRIGATION AND DEBRIDEMENT LEFT SUBMANDIBULAR INFECTION;  Surgeon: Diona Browner, DDS;  Location: [place];  Service: Oral Surgery;  Laterality: N/A;   PERIPHERAL VASCULAR ATHERECTOMY Right 09/23/2019   Procedure: PERIPHERAL VASCULAR ATHERECTOMY;  Surgeon: Wellington Hampshire, MD;  Location: [place] [facility];  Service: Cardiovascular;  Laterality: Right;  SFA   PERIPHERAL VASCULAR BALLOON ANGIOPLASTY Right 09/23/2019   Procedure: PERIPHERAL VASCULAR BALLOON ANGIOPLASTY;  Surgeon: Wellington Hampshire, MD;  Location: [place] [facility];  Service: Cardiovascular;  Laterality: Right;  SFA   RADIOLOGY WITH ANESTHESIA N/A 11/18/2018   Procedure: MRI OF THE CERVICAL SPINE WITHOUT  CONTRAST;  Surgeon: Radiologist, Medication, MD;  Location: [place];  Service: Radiology;  Laterality: N/A;   RADIOLOGY WITH ANESTHESIA N/A 12/03/2019   Procedure: MRI WITH ANESTHESIA CERVICAL WITH AND WITHOUT CONTRAST;  Surgeon: Radiologist, Medication, MD;  Location: [place];  Service: Radiology;  Laterality: N/A;   TONSILECTOMY, ADENOIDECTOMY, BILATERAL MYRINGOTOMY AND TUBES  1981   put not aware of tubes being put in [place]   Past Medical History:  Diagnosis Date   Anemia    in the past   Anxiety    yrs. 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 reported her father became deaf after having anesthesia   Fibromyalgia    GERD (gastroesophageal reflux disease)    Heart murmur    due to rheumatic fever, PCP no longer hears it   Hyperlipidemia    Hypertension    Myalgia    Neuropathy  PAD (peripheral artery disease) ([facility])    PAD (peripheral artery disease) ([place])    Pre-diabetes    Pyelonephritis 09/10/2019    "septic"   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, has to have chest xray   BP (!) 162/78   Pulse 86   Temp 98.5 F (36.9 C)   Ht 5' (1.524 m)   Wt 151 lb (68.5 kg)   SpO2 99%   BMI 29.49 kg/m   Opioid Risk Score:   Fall Risk Score:  `1  Depression screen PHQ 2/9  Depression screen [facility] 2/9 02/22/2021 01/02/2021 04/14/2020 11/20/2018 12/12/2016 11/08/2016 10/11/2016  Decreased Interest 0 0 0 0 0 0 0  Down, Depressed, Hopeless 0 0 0 0 0 0 0  PHQ - 2 Score 0 0 0 0 0 0 0  Altered sleeping - - - - - - 0  Tired, decreased energy - - - - - - 0  Change in appetite - - - - - - 0  Feeling bad or failure about yourself  - - - - - - 0  Trouble concentrating - - - - - - 0  Moving slowly or fidgety/restless - - - - - - 0  Suicidal thoughts - - - - - - 0  PHQ-9 Score - - - - - - 0  Difficult doing work/chores - - - - - - Not difficult at all     Review of Systems   Constitutional: Negative.   HENT: Negative.    Eyes: Negative.   Respiratory: Negative.    Cardiovascular: Negative.   Gastrointestinal: Negative.   Endocrine: Negative.   Genitourinary: Negative.   Musculoskeletal:  Positive for neck pain.       Nec spasms  Skin: Negative.   Allergic/Immunologic: Negative.   Neurological:  Positive for weakness.       Tingling  Hematological:  Bruises/bleeds easily.       Plavix  Psychiatric/Behavioral: Negative.    All other systems reviewed and are negative.     Objective:   Physical Exam Vitals and nursing note reviewed.  Constitutional:      Appearance: Normal appearance.  Cardiovascular:     Rate and Rhythm: Normal rate and regular rhythm.     Pulses: Normal pulses.     Heart sounds: Normal heart sounds.  Pulmonary:     Effort: Pulmonary effort is normal.     Breath sounds: Normal breath sounds.  Musculoskeletal:     Cervical back: Normal range of motion and neck supple.     Comments: Normal Muscle Bulk and Muscle Testing Reveals:  Upper Extremities: Right: Decreased ROM 90 Degrees and Muscle Strength 4/5 Left Upper Extremity: Full ROM and Muscle Strength 5/5 Bilateral AC Joint Tenderness Thoracic Hypersensitivity: T-1-T-3 Lumbar Hypersensitivity Left Greater Trochanter Tenderness Lower Extremities: Full ROM and Muscle Strength 5/5 Arises from Table slowly using walker for support     Skin:    General: Skin is warm and dry.  Neurological:     Mental Status: She is alert and oriented to person, place, and time.  Psychiatric:        Mood and Affect: Mood normal.        Behavior: Behavior normal.          Assessment & Plan:  Cervicalgia: Continue HEP as Tolerated. Continue to Monitor.  Cervical Radiculitis: Continue Amitriptyline. Continue to monitor/ Scheduled for MRI: Neurosurgery Following.  Left Lumbar Radiculitis: Continue HEP as tolerated. Continue current  medication regimen. Continue to monitor.  Muscle Spasm:  Continue current medication regimen with Flexeril and Tizanidine  Continue to monitor.  Numbness and tingling in right hand: Continue current medication regimen. Continue to monitor.  Chronic Pain Syndrome: Continue current medication regimen. Continue to monitor.  F/U in 3 months

## 2021-02-22 NOTE — Progress Notes (Deleted)
  The note originally documented on this encounter has been moved the the encounter in which it belongs.  

## 2021-03-01 ENCOUNTER — Encounter (HOSPITAL_COMMUNITY): Payer: Self-pay

## 2021-03-01 ENCOUNTER — Other Ambulatory Visit: Payer: Self-pay

## 2021-03-01 NOTE — Progress Notes (Addendum)
PCP - Dr. Orland Mustard Cardiologist - Dr. Fletcher Anon EKG - 08/23/20 Chest x-ray - 08/23/20 1 view ECHO -  Cardiac Cath -  CPAP -   Per pt, she is pre diabetic, last A1C 05/06/20 was 6.1  Per pt, not given any instructions on holding Plavix, however she did not plan on taking any of her medications for DOS until after procedure.   COVID TEST- n/a  Anesthesia review: yes cardiac hx  Per pt, heart murmur is being followed by PCP and cardiologist, per pt both providers have stated they do not hear any murmurs upon follow up visits and check-ups.   -------------  SDW INSTRUCTIONS:  Your procedure is scheduled on 6/23. Please report to [facility] Main Entrance "A" at 05:45 A.M., and check in at the Admitting office. Call this number if you have problems the morning of surgery: [phone]   Remember: Do not eat or drink after midnight the night before your surgery   Medications to take morning of surgery with a sip of water include: Tylenol - if needed Inhaler - Please bring all inhalers with you the day of surgery.  atorvastatin (LIPITOR) cyclobenzaprine (FLEXERIL)  fexofenadine (ALLEGRA)   Follow your surgeon's instructions on when to stop clopidogrel (PLAVIX).  If no instructions were given by your surgeon then you will need to call the office to get those instructions.     As of today, STOP taking any diclofenac Sodium (VOLTAREN) gel, Aspirin (unless otherwise instructed by your surgeon), Aleve, Naproxen, Ibuprofen, Motrin, Advil, Goody's, BC's, all herbal medications, fish oil, and all vitamins.    The Morning of Surgery Do not wear jewelry, make-up or nail polish. Do not wear lotions, powders, or perfumes, or deodorant Do not shave 48 hours prior to surgery.   Do not bring valuables to the hospital. [facility] is not responsible for any belongings or valuables.  If you are a smoker, DO NOT Smoke 24 hours prior to surgery If you wear a CPAP at night please bring your mask the  morning of surgery  Remember that you must have someone to transport you home after your surgery, and remain with you for 24 hours if you are discharged the same day.  Please bring cases for contacts, glasses, hearing aids, dentures or bridgework because it cannot be worn into surgery.   Patients discharged the day of surgery will not be allowed to drive home.   Please shower the NIGHT BEFORE/MORNING OF SURGERY (use antibacterial soap like DIAL soap if possible). Wear comfortable clothes the morning of surgery. Oral Hygiene is also important to reduce your risk of infection.  Remember - BRUSH YOUR TEETH THE MORNING OF SURGERY WITH YOUR REGULAR TOOTHPASTE  Patient denies shortness of breath, fever, cough and chest pain.

## 2021-03-01 NOTE — Anesthesia Preprocedure Evaluation (Addendum)
Anesthesia Evaluation  Patient identified by MRN, date of birth, ID band Patient awake    Reviewed: Allergy & Precautions, NPO status , Patient's Chart, lab work & pertinent test results  Airway Mallampati: II  TM Distance: >3 FB Neck ROM: Full  Mouth opening: Limited Mouth Opening  Dental no notable dental hx.    Pulmonary Patient abstained from smoking., former smoker,    Pulmonary exam normal breath sounds clear to auscultation       Cardiovascular hypertension, + Peripheral Vascular Disease  Normal cardiovascular exam Rhythm:Regular Rate:Normal     Neuro/Psych  Headaches, Anxiety  Neuromuscular disease (cervical radiculopathy)    GI/Hepatic Neg liver ROS, GERD  ,  Endo/Other  negative endocrine ROS  Renal/GU negative Renal ROS  negative genitourinary   Musculoskeletal  (+) Arthritis , Osteoarthritis,  Fibromyalgia -  Abdominal   Peds  Hematology  (+) anemia ,   Anesthesia Other Findings   Reproductive/Obstetrics                            Anesthesia Physical Anesthesia Plan  ASA: 3  Anesthesia Plan: MAC   Post-op Pain Management:    Induction:   PONV Risk Score and Plan: 2  Airway Management Planned: LMA  Additional Equipment: None  Intra-op Plan:   Post-operative Plan: Extubation in OR  Informed Consent: I have reviewed the patients History and Physical, chart, labs and discussed the procedure including the risks, benefits and alternatives for the proposed anesthesia with the patient or authorized representative who has indicated his/her understanding and acceptance.     Dental advisory given  Plan Discussed with: CRNA and Anesthesiologist  Anesthesia Plan Comments: (If GETA needed, will plan for video laryngoscopy given C5 pathology and history of unanticipated difficult airway. Norton Blizzard, MD  )       Anesthesia Quick Evaluation

## 2021-03-02 ENCOUNTER — Encounter (HOSPITAL_COMMUNITY): Payer: Self-pay

## 2021-03-02 ENCOUNTER — Ambulatory Visit (HOSPITAL_COMMUNITY)
Admission: RE | Admit: 2021-03-02 | Discharge: 2021-03-02 | Disposition: A | Discharge status: Home / Self Care | Payer: Medicare Other | Source: Ambulatory Visit | Attending: Physical Medicine & Rehabilitation | Admitting: Physical Medicine & Rehabilitation

## 2021-03-02 ENCOUNTER — Ambulatory Visit (HOSPITAL_COMMUNITY): Admission: RE | Admit: 2021-03-02 | Payer: Medicare Other | Source: Ambulatory Visit

## 2021-03-02 ENCOUNTER — Ambulatory Visit (HOSPITAL_COMMUNITY): Payer: Medicare Other | Admitting: Physician Assistant

## 2021-03-02 ENCOUNTER — Encounter (HOSPITAL_COMMUNITY): Admission: RE | Disposition: A | Discharge status: Home / Self Care | Payer: Self-pay | Source: Ambulatory Visit

## 2021-03-02 DIAGNOSIS — G9589 Other specified diseases of spinal cord: Secondary | ICD-10-CM | POA: Diagnosis not present

## 2021-03-02 DIAGNOSIS — Z87891 Personal history of nicotine dependence: Secondary | ICD-10-CM | POA: Diagnosis not present

## 2021-03-02 DIAGNOSIS — Z8759 Personal history of other complications of pregnancy, childbirth and the puerperium: Secondary | ICD-10-CM | POA: Diagnosis not present

## 2021-03-02 DIAGNOSIS — G894 Chronic pain syndrome: Secondary | ICD-10-CM | POA: Insufficient documentation

## 2021-03-02 DIAGNOSIS — M4322 Fusion of spine, cervical region: Secondary | ICD-10-CM | POA: Diagnosis not present

## 2021-03-02 DIAGNOSIS — M5031 Other cervical disc degeneration,  high cervical region: Secondary | ICD-10-CM | POA: Diagnosis not present

## 2021-03-02 DIAGNOSIS — K219 Gastro-esophageal reflux disease without esophagitis: Secondary | ICD-10-CM | POA: Diagnosis not present

## 2021-03-02 DIAGNOSIS — I10 Essential (primary) hypertension: Secondary | ICD-10-CM | POA: Insufficient documentation

## 2021-03-02 DIAGNOSIS — Z8543 Personal history of malignant neoplasm of ovary: Secondary | ICD-10-CM | POA: Insufficient documentation

## 2021-03-02 DIAGNOSIS — G629 Polyneuropathy, unspecified: Secondary | ICD-10-CM | POA: Insufficient documentation

## 2021-03-02 DIAGNOSIS — E785 Hyperlipidemia, unspecified: Secondary | ICD-10-CM | POA: Insufficient documentation

## 2021-03-02 DIAGNOSIS — R7303 Prediabetes: Secondary | ICD-10-CM | POA: Diagnosis not present

## 2021-03-02 DIAGNOSIS — Z981 Arthrodesis status: Secondary | ICD-10-CM | POA: Diagnosis not present

## 2021-03-02 DIAGNOSIS — M4722 Other spondylosis with radiculopathy, cervical region: Secondary | ICD-10-CM | POA: Diagnosis not present

## 2021-03-02 DIAGNOSIS — I739 Peripheral vascular disease, unspecified: Secondary | ICD-10-CM | POA: Diagnosis not present

## 2021-03-02 HISTORY — PX: RADIOLOGY WITH ANESTHESIA: SHX6223

## 2021-03-02 LAB — POCT I-STAT, CHEM 8
BUN: 12 mg/dL (ref 8–23)
Calcium, Ion: 1.35 mmol/L (ref 1.15–1.40)
Chloride: 106 mmol/L (ref 98–111)
Creatinine, Ser: 0.9 mg/dL (ref 0.44–1.00)
Glucose, Bld: 110 mg/dL — ABNORMAL HIGH (ref 70–99)
HCT: 36 % (ref 36.0–46.0)
Hemoglobin: 12.2 g/dL (ref 12.0–15.0)
Potassium: 3.4 mmol/L — ABNORMAL LOW (ref 3.5–5.1)
Sodium: 141 mmol/L (ref 135–145)
TCO2: 24 mmol/L (ref 22–32)

## 2021-03-02 LAB — GLUCOSE, CAPILLARY: Glucose-Capillary: 106 mg/dL — ABNORMAL HIGH (ref 70–99)

## 2021-03-02 SURGERY — MRI WITH ANESTHESIA
Anesthesia: Monitor Anesthesia Care

## 2021-03-02 MED ORDER — PROPOFOL 10 MG/ML IV BOLUS
INTRAVENOUS | Status: DC | PRN
  Administered 2021-03-02: 50 mg via INTRAVENOUS
  Administered 2021-03-02: 150 mg via INTRAVENOUS
  Administered 2021-03-02: 100 mg via INTRAVENOUS

## 2021-03-02 MED ORDER — PHENYLEPHRINE HCL-NACL 10-0.9 MG/250ML-% IV SOLN
INTRAVENOUS | Status: DC | PRN
  Administered 2021-03-02: 40 ug/min via INTRAVENOUS

## 2021-03-02 MED ORDER — LACTATED RINGERS IV SOLN: INTRAVENOUS | Status: DC | PRN

## 2021-03-02 MED ORDER — GADOBUTROL 1 MMOL/ML IV SOLN
7.0000 mL | Freq: Once | INTRAVENOUS | Status: AC | PRN
  Administered 2021-03-02: 7 mL via INTRAVENOUS

## 2021-03-02 MED ORDER — ONDANSETRON HCL 4 MG/2ML IJ SOLN
INTRAMUSCULAR | Status: DC | PRN
  Administered 2021-03-02: 4 mg via INTRAVENOUS

## 2021-03-02 MED ORDER — SUCCINYLCHOLINE CHLORIDE 200 MG/10ML IV SOSY
PREFILLED_SYRINGE | INTRAVENOUS | Status: DC | PRN
  Administered 2021-03-02: 140 mg via INTRAVENOUS

## 2021-03-02 MED ORDER — DEXAMETHASONE SODIUM PHOSPHATE 10 MG/ML IJ SOLN
INTRAMUSCULAR | Status: DC | PRN
  Administered 2021-03-02: 5 mg via INTRAVENOUS

## 2021-03-02 MED ORDER — LIDOCAINE 2% (20 MG/ML) 5 ML SYRINGE
INTRAMUSCULAR | Status: DC | PRN
  Administered 2021-03-02: 60 mg via INTRAVENOUS
  Administered 2021-03-02: 40 mg via INTRAVENOUS

## 2021-03-02 MED ORDER — CHLORHEXIDINE GLUCONATE 0.12 % MT SOLN
OROMUCOSAL | Status: AC
  Administered 2021-03-02: 15 mL
  Filled 2021-03-02: qty 15

## 2021-03-02 MED ORDER — FENTANYL CITRATE (PF) 250 MCG/5ML IJ SOLN
INTRAMUSCULAR | Status: DC | PRN
  Administered 2021-03-02 (×2): 25 ug via INTRAVENOUS

## 2021-03-02 NOTE — Anesthesia Procedure Notes (Signed)
Procedure Name: Intubation Date/Time: 03/02/2021 8:25 AM Performed by: Reece Agar, CRNA Pre-anesthesia Checklist: Patient identified, Emergency Drugs available, Suction available and Patient being monitored Patient Re-evaluated:Patient Re-evaluated prior to induction Oxygen Delivery Method: Circle System Utilized Preoxygenation: Pre-oxygenation with 100% oxygen Induction Type: IV induction Ventilation: Mask ventilation without difficulty Laryngoscope Size: Glidescope and 3 Grade View: Grade I Tube type: Oral Tube size: 7.0 mm Number of attempts: 1 Airway Equipment and Method: Stylet and Video-laryngoscopy Placement Confirmation: ETT inserted through vocal cords under direct vision, positive ETCO2 and breath sounds checked- equal and bilateral Secured at: 21 cm Tube secured with: Tape Dental Injury: Teeth and Oropharynx as per pre-operative assessment

## 2021-03-02 NOTE — Anesthesia Postprocedure Evaluation (Signed)
Anesthesia Post Note  Patient: Amy Bruce  Procedure(s) Performed: MRI CERVICAL SPINE WITH AND WITHOUT CONTRAST     Patient location during evaluation: PACU Anesthesia Type: General Level of consciousness: awake and alert Pain management: pain level controlled Vital Signs Assessment: post-procedure vital signs reviewed and stable Respiratory status: spontaneous breathing, nonlabored ventilation and respiratory function stable Cardiovascular status: blood pressure returned to baseline and stable Postop Assessment: no apparent nausea or vomiting Anesthetic complications: no   No notable events documented.  Last Vitals:  Vitals:   03/02/21 1000 03/02/21 1016  BP: 135/61 128/63  Pulse: 83 82  Resp: 13 11  Temp:  36.7 C  SpO2: 100% 100%    Last Pain:  Vitals:   03/02/21 1000  TempSrc:   PainSc: 0-No pain                 Merlinda Frederick

## 2021-03-02 NOTE — Transfer of Care (Signed)
Immediate Anesthesia Transfer of Care Note  Patient: Amy Bruce  Procedure(s) Performed: MRI CERVICAL SPINE WITH AND WITHOUT CONTRAST  Patient Location: PACU  Anesthesia Type:General  Level of Consciousness: drowsy  Airway & Oxygen Therapy: Patient Spontanous Breathing and Patient connected to face mask oxygen  Post-op Assessment: Report given to RN and Post -op Vital signs reviewed and stable  Post vital signs: Reviewed and stable  Last Vitals:  Vitals Value Taken Time  BP 147/62 03/02/21 0931  Temp    Pulse 82 03/02/21 0935  Resp 11 03/02/21 0935  SpO2 100 % 03/02/21 0935  Vitals shown include unvalidated device data.  Last Pain:  Vitals:   03/02/21 0635  TempSrc:   PainSc: 7       Patients Stated Pain Goal: 5 (51/83/43 7357)  Complications: No notable events documented.

## 2021-03-03 ENCOUNTER — Encounter (HOSPITAL_COMMUNITY): Payer: Self-pay | Admitting: Radiology

## 2021-03-16 DIAGNOSIS — M545 Low back pain, unspecified: Secondary | ICD-10-CM | POA: Diagnosis not present

## 2021-03-16 DIAGNOSIS — G8929 Other chronic pain: Secondary | ICD-10-CM | POA: Diagnosis not present

## 2021-03-16 DIAGNOSIS — R030 Elevated blood-pressure reading, without diagnosis of hypertension: Secondary | ICD-10-CM | POA: Diagnosis not present

## 2021-03-16 DIAGNOSIS — M4802 Spinal stenosis, cervical region: Secondary | ICD-10-CM | POA: Diagnosis not present

## 2021-03-27 ENCOUNTER — Other Ambulatory Visit: Payer: Self-pay | Admitting: Physical Medicine & Rehabilitation

## 2021-03-30 ENCOUNTER — Ambulatory Visit: Payer: Medicare Other | Attending: Neurosurgery | Admitting: Physical Therapy

## 2021-04-03 ENCOUNTER — Ambulatory Visit: Payer: Medicare Other | Admitting: Registered Nurse

## 2021-05-18 ENCOUNTER — Other Ambulatory Visit: Payer: Self-pay | Admitting: Physical Medicine & Rehabilitation

## 2021-05-25 ENCOUNTER — Ambulatory Visit: Payer: Medicare Other | Admitting: Registered Nurse

## 2021-05-25 ENCOUNTER — Other Ambulatory Visit: Payer: Self-pay

## 2021-05-25 ENCOUNTER — Encounter: Payer: Medicare Other | Attending: Physical Medicine & Rehabilitation | Admitting: Registered Nurse

## 2021-05-25 ENCOUNTER — Encounter: Payer: Self-pay | Admitting: Registered Nurse

## 2021-05-25 VITALS — BP 128/79 | HR 77 | Temp 98.3°F | Ht 60.0 in | Wt 149.0 lb

## 2021-05-25 DIAGNOSIS — G894 Chronic pain syndrome: Secondary | ICD-10-CM | POA: Diagnosis not present

## 2021-05-25 DIAGNOSIS — M7062 Trochanteric bursitis, left hip: Secondary | ICD-10-CM | POA: Diagnosis not present

## 2021-05-25 DIAGNOSIS — M542 Cervicalgia: Secondary | ICD-10-CM | POA: Diagnosis not present

## 2021-05-25 DIAGNOSIS — M7061 Trochanteric bursitis, right hip: Secondary | ICD-10-CM | POA: Insufficient documentation

## 2021-05-25 DIAGNOSIS — M62838 Other muscle spasm: Secondary | ICD-10-CM | POA: Diagnosis not present

## 2021-05-25 DIAGNOSIS — M5416 Radiculopathy, lumbar region: Secondary | ICD-10-CM | POA: Insufficient documentation

## 2021-05-25 DIAGNOSIS — M5412 Radiculopathy, cervical region: Secondary | ICD-10-CM | POA: Diagnosis not present

## 2021-05-25 MED ORDER — TIZANIDINE HCL 4 MG PO TABS: 2.0000 mg | ORAL_TABLET | Freq: Two times a day (BID) | ORAL | 2 refills | Status: DC | PRN

## 2021-05-25 MED ORDER — AMITRIPTYLINE HCL 50 MG PO TABS: 50.0000 mg | ORAL_TABLET | Freq: Every day | ORAL | 2 refills | Status: DC

## 2021-05-25 NOTE — Progress Notes (Signed)
Subjective:    Patient ID: Amy Bruce, female    DOB: 09-Oct-1950, 70 y.o.   MRN: [id number]  HPI: Amy Bruce is a 70 y.o. female who returns for follow up appointment for chronic pain and medication refill. She states her pain is located in her neck radiating into her shoulders and upper extremities with tingling and burning R>L and lower back pain radiating into her bilateral hips. She rates her pain 8. Her current exercise regime is walking and performing stretching exercises.  Amy Bruce reports she was diagnosed with BPPV, and reports at times she has dizziness. Today she denies any dizziness. Orthostatic blood pressure were obtained, she also states Dr Duwayne Heck following.     Pain Inventory Average Pain 9 Pain Right Now 8 My pain is sharp, burning, stabbing, and aching  In the last 24 hours, has pain interfered with the following? General activity 8 Relation with others 8 Enjoyment of life 8 What TIME of day is your pain at its worst? morning , daytime, evening, and night Sleep (in general) NA  Pain is worse with: walking, bending, sitting, standing, and some activites Pain improves with: TENS Relief from Meds:  no pain meds  walk without assistance walk with assistance use a cane use a walker ability to climb steps?  no do you drive?  no  not employed: date last employed 10/18/2015 retired  weakness numbness tingling spasms dizziness anxiety  Any changes since last visit?  no  Any changes since last visit?  no    Family History  Problem Relation Age of Onset   Ovarian cancer Mother    Social History   Socioeconomic History   Marital status: Divorced    Spouse name: Not on file   Number of children: Not on file   Years of education: Not on file   Highest education level: Not on file  Occupational History   Not on file  Tobacco Use   Smoking status: Former    Packs/day: 1.00    Years: 33.00    Pack years: 33.00    Types: Cigarettes     Quit date: 08/2020    Years since quitting: 0.7   Smokeless tobacco: Never   Tobacco comments:    2 ciggs a day  Vaping Use   Vaping Use: Never used  Substance and Sexual Activity   Alcohol use: Not Currently    Alcohol/week: 2.0 standard drinks    Types: 2 Shots of liquor per week    Comment: "not since 2021" - 03/01/21   Drug use: No   Sexual activity: Not on file  Other Topics Concern   Not on file  Social History Narrative   Not on file   Social Determinants of Health   Financial Resource Strain: Not on file  Food Insecurity: Not on file  Transportation Needs: Not on file  Physical Activity: Not on file  Stress: Not on file  Social Connections: Not on file   Past Surgical History:  Procedure Laterality Date   ABDOMINAL AORTOGRAM W/LOWER EXTREMITY N/A 04/02/2018   Procedure: ABDOMINAL AORTOGRAM W/LOWER EXTREMITY;  Surgeon: Wellington Hampshire, MD;  Location: [facility];  Service: Cardiovascular;  Laterality: N/A;   ABDOMINAL AORTOGRAM W/LOWER EXTREMITY N/A 09/23/2019   Procedure: ABDOMINAL AORTOGRAM W/LOWER EXTREMITY;  Surgeon: Wellington Hampshire, MD;  Location: [facility];  Service: Cardiovascular;  Laterality: N/A;   [facility]  [phone]  C5/6 6/7   COLONOSCOPY     CRANIOTOMY Right 08/23/2020   Procedure: RIGHT CRANIOTOMY HEMATOMA EVACUATION SUBDURAL;  Surgeon: Consuella Lose, MD;  Location: [place] [facility];  Service: Neurosurgery;  Laterality: Right;   ECTOPIC PREGNANCY SURGERY     Left axillary cyst removal 1989     MULTIPLE EXTRACTIONS WITH ALVEOLOPLASTY N/A 10/25/2017   Procedure: EXTRACTION NUMBERS FIVE, SEVEN, EIGHT, NINE, TEN, FIFTEEN, EIGHTEEN WITH ALVEOLOPLASTY, IRRIGATION AND DEBRIDEMENT LEFT SUBMANDIBULAR INFECTION;  Surgeon: Diona Browner, DDS;  Location: [facility];  Service: Oral Surgery;  Laterality: N/A;   PERIPHERAL VASCULAR ATHERECTOMY Right 09/23/2019   Procedure: PERIPHERAL VASCULAR ATHERECTOMY;  Surgeon:  Wellington Hampshire, MD;  Location: [facility];  Service: Cardiovascular;  Laterality: Right;  SFA   PERIPHERAL VASCULAR BALLOON ANGIOPLASTY Right 09/23/2019   Procedure: PERIPHERAL VASCULAR BALLOON ANGIOPLASTY;  Surgeon: Wellington Hampshire, MD;  Location: [facility];  Service: Cardiovascular;  Laterality: Right;  SFA   RADIOLOGY WITH ANESTHESIA N/A 11/18/2018   Procedure: MRI OF THE CERVICAL SPINE WITHOUT CONTRAST;  Surgeon: Radiologist, Medication, MD;  Location: [facility];  Service: Radiology;  Laterality: N/A;   RADIOLOGY WITH ANESTHESIA N/A 12/03/2019   Procedure: MRI WITH ANESTHESIA CERVICAL WITH AND WITHOUT CONTRAST;  Surgeon: Radiologist, Medication, MD;  Location: [facility];  Service: Radiology;  Laterality: N/A;   RADIOLOGY WITH ANESTHESIA N/A 03/02/2021   Procedure: MRI CERVICAL SPINE WITH AND WITHOUT CONTRAST;  Surgeon: Radiologist, Medication, MD;  Location: [facility];  Service: Radiology;  Laterality: N/A;   TONSILECTOMY, ADENOIDECTOMY, BILATERAL MYRINGOTOMY AND TUBES  1981   put not aware of tubes being put in [place]   Past Medical History:  Diagnosis Date   Anemia    in the past   Anxiety    yrs. 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 reported her father became deaf after having anesthesia   Fibromyalgia    GERD (gastroesophageal reflux disease)    Heart murmur    due to rheumatic fever, PCP no longer hears it   Hyperlipidemia    Hypertension    Myalgia    Neuropathy    PAD (peripheral artery disease) ([facility])    PAD (peripheral artery disease) ([place])    Pre-diabetes    Pyelonephritis 09/10/2019    "septic"   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, has to have chest xray   BP (!) 144/80   Pulse 79   Temp 98.3 F (36.8 C)   Ht 5' (1.524 m)   Wt 149 lb (67.6 kg)   SpO2 98%   BMI 29.10 kg/m   Opioid Risk Score:   Fall Risk Score:  `1  Depression  screen PHQ 2/9  Depression screen [facility] 2/9 05/25/2021 02/22/2021 01/02/2021 04/14/2020 11/20/2018 12/12/2016 11/08/2016  Decreased Interest 0 0 0 0 0 0 0  Down, Depressed, Hopeless 0 0 0 0 0 0 0  PHQ - 2 Score 0 0 0 0 0 0 0  Altered sleeping - - - - - - -  Tired, decreased energy - - - - - - -  Change in appetite - - - - - - -  Feeling bad or failure about yourself  - - - - - - -  Trouble concentrating - - - - - - -  Moving slowly or  fidgety/restless - - - - - - -  Suicidal thoughts - - - - - - -  PHQ-9 Score - - - - - - -  Difficult doing work/chores - - - - - - -     Review of Systems  Constitutional: Negative.   HENT: Negative.    Eyes: Negative.   Respiratory: Negative.    Cardiovascular: Negative.   Gastrointestinal: Negative.   Endocrine: Negative.   Genitourinary: Negative.   Musculoskeletal:  Positive for gait problem.       Spasms  Skin: Negative.   Neurological:  Positive for dizziness, weakness and numbness.       Tingling  Hematological:  Bruises/bleeds easily.       Plavix  Psychiatric/Behavioral:  The patient is nervous/anxious.   All other systems reviewed and are negative.     Objective:   Physical Exam Vitals and nursing note reviewed.  Constitutional:      Appearance: Normal appearance.  Neck:     Comments: Cervical Paraspinal Tenderness: C-5-C-6 Cardiovascular:     Rate and Rhythm: Normal rate and regular rhythm.     Pulses: Normal pulses.     Heart sounds: Normal heart sounds.  Pulmonary:     Effort: Pulmonary effort is normal.     Breath sounds: Normal breath sounds.  Musculoskeletal:     Cervical back: Normal range of motion and neck supple.     Comments: Normal Muscle Bulk and Muscle Testing Reveals:  Upper Extremities: Full ROM and Muscle Strength 5/5 Bilateral AC Joint tenderness Lumbar Hypersensitivity Lower Extremities: Full ROM and Muscle Strength 5/5 Arises from Table slowly using cane for support Narrow Based  Gait     Skin:    General:  Skin is warm and dry.  Neurological:     Mental Status: She is alert and oriented to person, place, and time.  Psychiatric:        Mood and Affect: Mood normal.        Behavior: Behavior normal.         Assessment & Plan:  Cervicalgia/ Cervical Radiculitis: Continue Amitriptyline. Continue HEP as Tolerated. Continue to Monitor. Neurosurgery Following. 05/25/2021 2. Lumbar Radiculitis: Continue HEP as tolerated. Continue current medication regimen. Continue to monitor. Neurosurgery Following. 05/25/2021 3.Muscle Spasm: Continue current medication regimen with Tizanidine  Continue to monitor. 05/25/2021 4. Numbness and tingling in bilateral upper extremities : Continue current medication regimen. Continue to monitor. 05/25/2021 5.Chronic Pain Syndrome: Continue current medication regimen. Continue to monitor. 05/25/2021   F/U in 3 months

## 2021-06-06 ENCOUNTER — Ambulatory Visit: Payer: Medicare Other | Admitting: Cardiovascular Disease

## 2021-06-06 NOTE — Progress Notes (Deleted)
Cardiology Office Note   Date:  06/06/2021   ID:  Meredyth, Hornung 1950-12-13, MRN [id number]  PCP:  No primary care provider on file.  Cardiologist:   Lorine Bears, MD   No chief complaint on file.     History of Present Illness: Amy Bruce is a 70 y.o. female who is here today for a follow-up visit regarding peripheral arterial disease.  She has known history of peripheral arterial disease, tobacco use, essential hypertension and hyperlipidemia.  Previous CT imaging showed evidence of aortic and coronary calcifications.  She has prolonged history of plantar fasciitis. She has known history of peripheral arterial disease with claudication. Angiography in July 2019 showed diffuse right SFA disease with medium length occlusion in the mid to distal segment.  On the left there was moderate ostial left SFA stenosis with two-vessel runoff below the knee.   Lexiscan Myoview in September 2019 showed no evidence of ischemia with normal ejection fraction. She had worsening claudication early 2021.  Angiography was done in January 2021  which showed no significant change from last year.  I performed successful orbital atherectomy to the proximal and mid right SFA followed by drug-coated balloon angioplasty.  The distal SFA was treated with drug-eluting stent given that the reentry was subintimal.  Postprocedure ABI improved to normal with mildly decreased toe pressure. She cut down on tobacco use after the procedure but could not quit completely.  Most recent Doppler studies in May showed an ABI of 0.99 on the right and 0.81 on the left.  Duplex showed patent right SFA stent with no significant restenosis.  She has chronic back pain.  No calf claudication.  No chest pain or shortness of breath.  She takes her medications regularly.  Past Medical History:  Diagnosis Date   Anemia    in the past   Anxiety    yrs. ago panic attacks   Arthritis    Cancer ([facility])    small in ovary -  had total hysterestomy   Complication of anesthesia    pt. reported her father became deaf after having anesthesia   Fibromyalgia    GERD (gastroesophageal reflux disease)    Heart murmur    due to rheumatic fever, PCP no longer hears it   Hyperlipidemia    Hypertension    Myalgia    Neuropathy    PAD (peripheral artery disease) ([facility])    PAD (peripheral artery disease) ([facility])    Pre-diabetes    Pyelonephritis 09/10/2019    "septic"   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, has to have chest xray    Past Surgical History:  Procedure Laterality Date   ABDOMINAL AORTOGRAM W/LOWER EXTREMITY N/A 04/02/2018   Procedure: ABDOMINAL AORTOGRAM W/LOWER EXTREMITY;  Surgeon: Iran Ouch, MD;  [facility];  Service: Cardiovascular;  Laterality: N/A;   ABDOMINAL AORTOGRAM W/LOWER EXTREMITY N/A 09/23/2019   Procedure: ABDOMINAL AORTOGRAM W/LOWER EXTREMITY;  Surgeon: Iran Ouch, MD;  Location: [facility];  Service: Cardiovascular;  Laterality: N/A;   ABDOMINAL HYSTERECTOMY  1999   CERVICAL SPINE SURGERY  [id number],1113   C5/6 6/7   COLONOSCOPY     CRANIOTOMY Right 08/23/2020   Procedure: RIGHT CRANIOTOMY HEMATOMA EVACUATION SUBDURAL;  Surgeon: Lisbeth Renshaw, MD;  Location: [facility];  Service: Neurosurgery;  Laterality: Right;   ECTOPIC PREGNANCY SURGERY     Left axillary cyst removal 1989     MULTIPLE EXTRACTIONS WITH  ALVEOLOPLASTY N/A 10/25/2017   Procedure: EXTRACTION NUMBERS FIVE, SEVEN, EIGHT, NINE, TEN, FIFTEEN, EIGHTEEN WITH ALVEOLOPLASTY, IRRIGATION AND DEBRIDEMENT LEFT SUBMANDIBULAR INFECTION;  Surgeon: Diona Browner, DDS;  Location: [place];  Service: Oral Surgery;  Laterality: N/A;   PERIPHERAL VASCULAR ATHERECTOMY Right 09/23/2019   Procedure: PERIPHERAL VASCULAR ATHERECTOMY;  Surgeon: Wellington Hampshire, MD;  Location: [facility];  Service: Cardiovascular;  Laterality: Right;  SFA   PERIPHERAL VASCULAR BALLOON ANGIOPLASTY Right 09/23/2019    Procedure: PERIPHERAL VASCULAR BALLOON ANGIOPLASTY;  Surgeon: Wellington Hampshire, MD;  Location: [facility];  Service: Cardiovascular;  Laterality: Right;  SFA   RADIOLOGY WITH ANESTHESIA N/A 11/18/2018   Procedure: MRI OF THE CERVICAL SPINE WITHOUT CONTRAST;  Surgeon: Radiologist, Medication, MD;  Location: [place];  Service: Radiology;  Laterality: N/A;   RADIOLOGY WITH ANESTHESIA N/A 12/03/2019   Procedure: MRI WITH ANESTHESIA CERVICAL WITH AND WITHOUT CONTRAST;  Surgeon: Radiologist, Medication, MD;  Location: [place];  Service: Radiology;  Laterality: N/A;   RADIOLOGY WITH ANESTHESIA N/A 03/02/2021   Procedure: MRI CERVICAL SPINE WITH AND WITHOUT CONTRAST;  Surgeon: Radiologist, Medication, MD;  Location: [place];  Service: Radiology;  Laterality: N/A;   TONSILECTOMY, ADENOIDECTOMY, BILATERAL MYRINGOTOMY AND TUBES  1981   put not aware of tubes being put in ears   TUBAL LIGATION  1981     Current Outpatient Medications  Medication Sig Dispense Refill   acetaminophen (TYLENOL) 325 MG tablet Take 2 tablets (650 mg total) by mouth every 4 (four) hours as needed for mild pain (temp > 100.5).     albuterol (VENTOLIN HFA) 108 (90 Base) MCG/ACT inhaler INHALE 1 TO 2 PUFFS INTO THE LUNGS EVERY 4 HOURS AS NEEDED FOR WHEEZING (Patient taking differently: Inhale 1-2 puffs into the lungs every 4 (four) hours as needed for wheezing or shortness of breath.) 18 g 5   amitriptyline (ELAVIL) 50 MG tablet Take 1 tablet (50 mg total) by mouth at bedtime. 30 tablet 2   atorvastatin (LIPITOR) 80 MG tablet TAKE 1 TABLET (80 MG TOTAL) BY MOUTH DAILY. 30 tablet 0   Carbamide Peroxide (EAR WAX REMOVAL KIT OT) Place 5-10 drops in ear(s) as needed (ear wax buildup).     clopidogrel (PLAVIX) 75 MG tablet Take 75 mg by mouth in the morning.     Cyanocobalamin (B-12) 1000 MCG TABS TAKE 1 TABLET (1,000 MCG TOTAL) BY MOUTH DAILY. (Patient taking differently: Take 1,000 mcg by mouth in the morning.) 30 tablet 0    cyclobenzaprine (FLEXERIL) 10 MG tablet Take 10 mg by mouth 3 (three) times daily.     diclofenac Sodium (VOLTAREN) 1 % GEL APPLY 2 G TOPICALLY FOUR TIMES DAILY. (Patient taking differently: Apply 2 g topically 4 (four) times daily as needed (pain).) 200 g 0   fexofenadine (ALLEGRA) 180 MG tablet Take 180 mg by mouth in the morning.     mirabegron ER (MYRBETRIQ) 25 MG TB24 tablet Take 1 tablet (25 mg total) by mouth daily. (Patient not taking: Reported on 05/25/2021) 30 tablet 2   Omega-3 Fatty Acids (FISH OIL) 1000 MG CAPS Take 1,000 mg by mouth in the morning.     pantoprazole (PROTONIX) 40 MG tablet TAKE 1 TABLET (40 MG TOTAL) BY MOUTH DAILY. (Patient taking differently: Take 40 mg by mouth daily before supper.) 30 tablet 0   tiZANidine (ZANAFLEX) 4 MG tablet Take 0.5 tablets (2 mg total) by mouth 2 (two) times daily as needed for muscle spasms. 30 tablet  2   topiramate (TOPAMAX) 100 MG tablet TAKE 1 TABLET(100 MG) BY MOUTH TWICE DAILY 90 tablet 2   No current facility-administered medications for this visit.    Allergies:   Carisoprodol and Duloxetine hcl    Social History:  The patient  reports that she quit smoking about 9 months ago. Her smoking use included cigarettes. She has a 33.00 pack-year smoking history. She has never used smokeless tobacco. She reports that she does not currently use alcohol after a past usage of about 2.0 standard drinks per week. She reports that she does not use drugs.   Family History:  The patient's family history includes Ovarian cancer in her mother.    ROS:  Please see the history of present illness.   Otherwise, review of systems are positive for none.   All other systems are reviewed and negative.    PHYSICAL EXAM: VS:  There were no vitals taken for this visit. , BMI There is no height or weight on file to calculate BMI. GEN: Well nourished, well developed, in no acute distress  HEENT: normal  Neck: no JVD, carotid bruits, or masses Cardiac: RRR;  no murmurs, rubs, or gallops Respiratory:  clear to auscultation bilaterally, normal work of breathing GI: soft, nontender, nondistended, + BS MS: no deformity or atrophy  Skin: warm and dry, no rash Neuro:  Strength and sensation are intact Psych: euthymic mood, full affect Vascular: Femoral pulses are +1 bilaterally.     EKG:  EKG is not  ordered today.   Recent Labs: 08/23/2020: TSH 1.650 09/01/2020: Magnesium 2.3 09/02/2020: ALT 26; Platelets 341 03/02/2021: BUN 12; Creatinine, Ser 0.90; Hemoglobin 12.2; Potassium 3.4; Sodium 141    Lipid Panel No results found for: CHOL, TRIG, HDL, CHOLHDL, VLDL, LDLCALC, LDLDIRECT    Wt Readings from Last 3 Encounters:  05/25/21 149 lb (67.6 kg)  03/02/21 151 lb (68.5 kg)  02/22/21 151 lb (68.5 kg)       No flowsheet data found.    ASSESSMENT AND PLAN:  1. Peripheral arterial disease: Status post recent right SFA endovascular intervention for severe right calf claudication.  No recurrent calf claudication.  Continue dual antiplatelet therapy as tolerated.  Repeat vascular studies in 6 months.  2.  Tobacco use: I again had a prolonged discussion with her about the importance of smoking cessation.  3.  Hyperlipidemia: Continue atorvastatin with a target LDL of less than 70.  4.  Essential hypertension: Blood pressure is reasonably controlled on current medications.  5.  Chronic venous insufficiency: She uses knee-high support stockings as needed.   Disposition:   FU with me in 6 months.  Signed,  Kathlyn Sacramento, MD  06/06/2021 8:08 AM    [place]

## 2021-07-28 ENCOUNTER — Other Ambulatory Visit: Payer: Self-pay | Admitting: Physical Medicine & Rehabilitation

## 2021-07-28 NOTE — Telephone Encounter (Signed)
Pharmacy is sending a refill for Diclofenac. Dr. Posey Pronto discontinued the medication in 05/2020. Called the pharmacy and they stated the patient is requesting the refill. Would you like to refill?

## 2021-08-01 NOTE — Telephone Encounter (Signed)
Since Dr Posey Pronto discontinue , we will continue to order.  Just call Amy Bruce and let her know it was discontinued by Dr Posey Pronto over a year ago.

## 2021-08-23 ENCOUNTER — Encounter: Payer: Medicare Other | Attending: Physical Medicine & Rehabilitation | Admitting: Registered Nurse

## 2021-08-23 DIAGNOSIS — M542 Cervicalgia: Secondary | ICD-10-CM | POA: Insufficient documentation

## 2021-08-23 DIAGNOSIS — M7061 Trochanteric bursitis, right hip: Secondary | ICD-10-CM | POA: Insufficient documentation

## 2021-08-23 DIAGNOSIS — G894 Chronic pain syndrome: Secondary | ICD-10-CM | POA: Insufficient documentation

## 2021-08-23 DIAGNOSIS — M62838 Other muscle spasm: Secondary | ICD-10-CM | POA: Insufficient documentation

## 2021-08-23 DIAGNOSIS — M5412 Radiculopathy, cervical region: Secondary | ICD-10-CM | POA: Insufficient documentation

## 2021-08-23 DIAGNOSIS — M5416 Radiculopathy, lumbar region: Secondary | ICD-10-CM | POA: Insufficient documentation

## 2021-08-23 DIAGNOSIS — M7062 Trochanteric bursitis, left hip: Secondary | ICD-10-CM | POA: Insufficient documentation

## 2021-08-23 NOTE — Progress Notes (Deleted)
Subjective:    Patient ID: Amy Bruce, female    DOB: [birth year], 70 y.o.   MRN: [id number]  HPI   Pain Inventory Average Pain 9 Pain Right Now 8 My pain is sharp, burning, stabbing, and aching  In the last 24 hours, has pain interfered with the following? General activity 8 Relation with others 8 Enjoyment of life 8 What TIME of day is your pain at its worst? morning , daytime, evening, and night Sleep (in general) Fair  Pain is worse with: walking, bending, sitting, inactivity, standing, and unsure Pain improves with: TENS Relief from Meds:  na  Family History  Problem Relation Age of Onset   Ovarian cancer Mother    Social History   Socioeconomic History   Marital status: Divorced    Spouse name: Not on file   Number of children: Not on file   Years of education: Not on file   Highest education level: Not on file  Occupational History   Not on file  Tobacco Use   Smoking status: Former    Packs/day: 1.00    Years: 33.00    Pack years: 33.00    Types: Cigarettes    Quit date: 08/2020    Years since quitting: 1.0   Smokeless tobacco: Never   Tobacco comments:    2 ciggs a day  Vaping Use   Vaping Use: Never used  Substance and Sexual Activity   Alcohol use: Not Currently    Alcohol/week: 2.0 standard drinks    Types: 2 Shots of liquor per week    Comment: "not since 2021" - 03/01/21   Drug use: No   Sexual activity: Not on file  Other Topics Concern   Not on file  Social History Narrative   Not on file   Social Determinants of Health   Financial Resource Strain: Not on file  Food Insecurity: Not on file  Transportation Needs: Not on file  Physical Activity: Not on file  Stress: Not on file  Social Connections: Not on file   Past Surgical History:  Procedure Laterality Date   ABDOMINAL AORTOGRAM W/LOWER EXTREMITY N/A 04/02/2018   Procedure: ABDOMINAL AORTOGRAM W/LOWER EXTREMITY;  Surgeon: Wellington Hampshire, MD;  Location: [facility];  Service: Cardiovascular;  Laterality: N/A;   ABDOMINAL AORTOGRAM W/LOWER EXTREMITY N/A 09/23/2019   Procedure: ABDOMINAL AORTOGRAM W/LOWER EXTREMITY;  Surgeon: Wellington Hampshire, MD;  Location: [facility];  Service: Cardiovascular;  Laterality: N/A;   ABDOMINAL HYSTERECTOMY  1999   CERVICAL SPINE SURGERY  8563,1497   C5/6 6/7   COLONOSCOPY     CRANIOTOMY Right 08/23/2020   Procedure: RIGHT CRANIOTOMY HEMATOMA EVACUATION SUBDURAL;  Surgeon: Consuella Lose, MD;  Location: [place] [place];  Service: Neurosurgery;  Laterality: Right;   ECTOPIC PREGNANCY SURGERY     Left axillary cyst removal 1989     MULTIPLE EXTRACTIONS WITH ALVEOLOPLASTY N/A 10/25/2017   Procedure: EXTRACTION NUMBERS FIVE, SEVEN, EIGHT, NINE, TEN, FIFTEEN, EIGHTEEN WITH ALVEOLOPLASTY, IRRIGATION AND DEBRIDEMENT LEFT SUBMANDIBULAR INFECTION;  Surgeon: Diona Browner, DDS;  Location: [facility];  Service: Oral Surgery;  Laterality: N/A;   PERIPHERAL VASCULAR ATHERECTOMY Right 09/23/2019   Procedure: PERIPHERAL VASCULAR ATHERECTOMY;  Surgeon: Wellington Hampshire, MD;  Location: [facility];  Service: Cardiovascular;  Laterality: Right;  SFA   PERIPHERAL VASCULAR BALLOON ANGIOPLASTY Right 09/23/2019   Procedure: PERIPHERAL VASCULAR BALLOON ANGIOPLASTY;  Surgeon: Wellington Hampshire, MD;  Location: [facility];  Service:  Cardiovascular;  Laterality: Right;  SFA   RADIOLOGY WITH ANESTHESIA N/A 11/18/2018   Procedure: MRI OF THE CERVICAL SPINE WITHOUT CONTRAST;  Surgeon: Radiologist, Medication, MD;  Location: [facility];  Service: Radiology;  Laterality: N/A;   RADIOLOGY WITH ANESTHESIA N/A 12/03/2019   Procedure: MRI WITH ANESTHESIA CERVICAL WITH AND WITHOUT CONTRAST;  Surgeon: Radiologist, Medication, MD;  Location: [facility];  Service: Radiology;  Laterality: N/A;   RADIOLOGY WITH ANESTHESIA N/A 03/02/2021   Procedure: MRI CERVICAL SPINE WITH AND WITHOUT CONTRAST;  Surgeon: Radiologist, Medication, MD;  Location: [facility];  Service:  Radiology;  Laterality: N/A;   TONSILECTOMY, ADENOIDECTOMY, BILATERAL MYRINGOTOMY AND TUBES  1981   put not aware of tubes being put in [place]   Past Surgical History:  Procedure Laterality Date   ABDOMINAL AORTOGRAM W/LOWER EXTREMITY N/A 04/02/2018   Procedure: ABDOMINAL AORTOGRAM W/LOWER EXTREMITY;  Surgeon: Wellington Hampshire, MD;  Location: [facility];  Service: Cardiovascular;  Laterality: N/A;   ABDOMINAL AORTOGRAM W/LOWER EXTREMITY N/A 09/23/2019   Procedure: ABDOMINAL AORTOGRAM W/LOWER EXTREMITY;  Surgeon: Wellington Hampshire, MD;  Location: [facility];  Service: Cardiovascular;  Laterality: N/A;   ABDOMINAL HYSTERECTOMY  1999   CERVICAL SPINE SURGERY  [id number]   C5/6 6/7   COLONOSCOPY     CRANIOTOMY Right 08/23/2020   Procedure: RIGHT CRANIOTOMY HEMATOMA EVACUATION SUBDURAL;  Surgeon: Consuella Lose, MD;  Location: [facility];  Service: Neurosurgery;  Laterality: Right;   ECTOPIC PREGNANCY SURGERY     Left axillary cyst removal 1989     MULTIPLE EXTRACTIONS WITH ALVEOLOPLASTY N/A 10/25/2017   Procedure: EXTRACTION NUMBERS FIVE, SEVEN, EIGHT, NINE, TEN, FIFTEEN, EIGHTEEN WITH ALVEOLOPLASTY, IRRIGATION AND DEBRIDEMENT LEFT SUBMANDIBULAR INFECTION;  Surgeon: Diona Browner, DDS;  Location: [facility];  Service: Oral Surgery;  Laterality: N/A;   PERIPHERAL VASCULAR ATHERECTOMY Right 09/23/2019   Procedure: PERIPHERAL VASCULAR ATHERECTOMY;  Surgeon: Wellington Hampshire, MD;  Location: [facility];  Service: Cardiovascular;  Laterality: Right;  SFA   PERIPHERAL VASCULAR BALLOON ANGIOPLASTY Right 09/23/2019   Procedure: PERIPHERAL VASCULAR BALLOON ANGIOPLASTY;  Surgeon: Wellington Hampshire, MD;  Location: [facility];  Service: Cardiovascular;  Laterality: Right;  SFA   RADIOLOGY WITH ANESTHESIA N/A 11/18/2018   Procedure: MRI OF THE CERVICAL SPINE WITHOUT CONTRAST;  Surgeon: Radiologist, Medication, MD;  Location: [facility];  Service: Radiology;  Laterality:  N/A;   RADIOLOGY WITH ANESTHESIA N/A 12/03/2019   Procedure: MRI WITH ANESTHESIA CERVICAL WITH AND WITHOUT CONTRAST;  Surgeon: Radiologist, Medication, MD;  Location: [facility];  Service: Radiology;  Laterality: N/A;   RADIOLOGY WITH ANESTHESIA N/A 03/02/2021   Procedure: MRI CERVICAL SPINE WITH AND WITHOUT CONTRAST;  Surgeon: Radiologist, Medication, MD;  Location: [facility];  Service: Radiology;  Laterality: N/A;   TONSILECTOMY, ADENOIDECTOMY, BILATERAL MYRINGOTOMY AND TUBES  1981   put not aware of tubes being put in [place]   Past Medical History:  Diagnosis Date   Anemia    in the past   Anxiety    yrs. 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  Opioid Risk Score:   Fall Risk Score:  `1  Depression screen PHQ 2/9  Depression screen [facility] 2/9 05/25/2021 02/22/2021 01/02/2021 04/14/2020 11/20/2018 12/12/2016 11/08/2016  Decreased Interest 0 0 0 0 0 0 0  Down, Depressed, Hopeless 0 0 0 0 0 0 0  PHQ - 2 Score 0 0 0 0 0 0 0  Altered sleeping - - - - - - -  Tired, decreased energy - - - - - - -  Change in appetite - - - - - - -  Feeling bad or failure about yourself  - - - - - - -  Trouble concentrating - - - - - - -  Moving slowly or fidgety/restless - - - - - - -  Suicidal thoughts - - - - - - -  PHQ-9 Score - - - - - - -  Difficult doing work/chores - - - - - - -    Review of Systems      Objective:   Physical Exam        Assessment & Plan:

## 2021-08-24 ENCOUNTER — Encounter: Payer: Medicare Other | Admitting: Registered Nurse

## 2021-08-26 ENCOUNTER — Other Ambulatory Visit: Payer: Self-pay | Admitting: Physical Medicine & Rehabilitation

## 2021-09-17 ENCOUNTER — Other Ambulatory Visit: Payer: Self-pay | Admitting: Physical Medicine & Rehabilitation

## 2021-09-18 NOTE — Telephone Encounter (Signed)
Dr. Posey Pronto ordered medication in April 2022 for urinary incontinence. Would you like to refill?

## 2021-09-25 ENCOUNTER — Other Ambulatory Visit: Payer: Self-pay | Admitting: Physical Medicine & Rehabilitation

## 2021-10-15 ENCOUNTER — Other Ambulatory Visit: Payer: Self-pay | Admitting: Registered Nurse

## 2021-11-05 ENCOUNTER — Other Ambulatory Visit: Payer: Self-pay | Admitting: Cardiovascular Disease

## 2021-11-06 ENCOUNTER — Other Ambulatory Visit: Payer: Self-pay | Admitting: Cardiovascular Disease

## 2021-11-06 NOTE — Telephone Encounter (Signed)
Refill request

## 2021-11-21 ENCOUNTER — Other Ambulatory Visit: Payer: Self-pay

## 2021-11-21 ENCOUNTER — Encounter: Payer: Self-pay | Admitting: Cardiovascular Disease

## 2021-11-21 ENCOUNTER — Ambulatory Visit (INDEPENDENT_AMBULATORY_CARE_PROVIDER_SITE_OTHER): Payer: Medicare Other | Admitting: Cardiovascular Disease

## 2021-11-21 ENCOUNTER — Other Ambulatory Visit (HOSPITAL_COMMUNITY): Payer: Self-pay

## 2021-11-21 VITALS — BP 156/82 | HR 93 | Ht 60.0 in | Wt 167.8 lb

## 2021-11-21 DIAGNOSIS — I739 Peripheral vascular disease, unspecified: Secondary | ICD-10-CM

## 2021-11-21 DIAGNOSIS — E785 Hyperlipidemia, unspecified: Secondary | ICD-10-CM | POA: Diagnosis not present

## 2021-11-21 DIAGNOSIS — I872 Venous insufficiency (chronic) (peripheral): Secondary | ICD-10-CM

## 2021-11-21 DIAGNOSIS — I10 Essential (primary) hypertension: Secondary | ICD-10-CM

## 2021-11-21 MED ORDER — ATORVASTATIN CALCIUM 80 MG PO TABS: ORAL_TABLET | Freq: Every day | ORAL | 3 refills | Status: DC

## 2021-11-21 NOTE — Progress Notes (Signed)
?  ?Cardiology Office Note ? ? ?Date:  11/21/2021  ? ?ID:  Amy Bruce, DOB 1950-10-20, MRN [id number] ? ?PCP:  Pcp, No  ?Cardiologist:   Kathlyn Sacramento, MD  ? ?No chief complaint on file. ? ? ?  ?History of Present Illness: ?Amy Bruce is a 71 y.o. female who is here today for a follow-up visit regarding peripheral arterial disease. ? ?She has known history of peripheral arterial disease, tobacco use, essential hypertension and hyperlipidemia.  Previous CT imaging showed evidence of aortic and coronary calcifications.  She has prolonged history of plantar fasciitis. ?She has known history of peripheral arterial disease with moderately reduced ABI on the right at 0.54 and mild on the left at 0.81.  ?Angiography in July 2019 showed diffuse right SFA disease with medium length occlusion in the mid to distal segment.  On the left there was moderate ostial left SFA stenosis with two-vessel runoff below the knee.  She was referred to vascular surgery to consider right femoral-popliteal bypass but medical therapy was pursued after.   ?Lexiscan Myoview in September 2019 showed no evidence of ischemia with normal ejection fraction. ?She had back surgery done last year.  ?She had worsening claudication in 2021.  Angiography was done in January 2021 which showed no significant change from previous angiography.  I performed successful orbital atherectomy to the proximal and mid right SFA followed by drug-coated balloon angioplasty.  The distal SFA was treated with drug-eluting stent given that the reentry was subintimal.  Postprocedure ABI improved to normal with mildly decreased toe pressure. ? ?Most recent vascular studies in May 2022 showed normal ABI on the right side and mildly reduced on the left side.  Duplex showed patent right SFA. ? ?She quit smoking in December.  She reports recurrent right leg pain similar to previous claudication.  However, she describes burning sensation on the lateral side of the thigh  that radiates all the way down to the ankle.  She continues to walk half a mile to a mile daily.  She is dealing with cervical spine issues and she uses a cane occasionally for balance. ? ?Past Medical History:  ?Diagnosis Date  ? Anemia   ? in the past  ? Anxiety   ? yrs. ago panic attacks  ? Arthritis   ? Cancer [facility])   ? 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 Complication of anesthesia   ? pt. reported her father became deaf after having anesthesia  ? Fibromyalgia   ? GERD (gastroesophageal reflux disease)   ? Heart murmur   ? due to rheumatic fever, PCP no longer hears it  ? Hyperlipidemia   ? Hypertension   ? Myalgia   ? Neuropathy   ? PAD (peripheral artery disease) ([place])   ? PAD (peripheral artery disease) ([place])   ? Pre-diabetes   ? Pyelonephritis 09/10/2019  ?  "septic"  ? Tobacco abuse   ? Tuberculosis   ? exposed, has to have chest xray  ? ? ?Past Surgical History:  ?Procedure Laterality Date  ? ABDOMINAL AORTOGRAM W/LOWER EXTREMITY N/A 04/02/2018  ? Procedure: ABDOMINAL AORTOGRAM W/LOWER EXTREMITY;  Surgeon: Wellington Hampshire, MD;  Location: [facility];  Service: Cardiovascular;  Laterality: N/A;  ? ABDOMINAL AORTOGRAM W/LOWER EXTREMITY N/A 09/23/2019  ? Procedure: ABDOMINAL AORTOGRAM W/LOWER EXTREMITY;  Surgeon: Wellington Hampshire, MD;  Location: [facility];  Service: Cardiovascular;  Laterality: N/A;  ? ABDOMINAL HYSTERECTOMY  1999  ? CERVICAL  SPINE SURGERY  [phone]  ? C5/6 6/7  ? COLONOSCOPY    ? CRANIOTOMY Right 08/23/2020  ? Procedure: RIGHT CRANIOTOMY HEMATOMA EVACUATION SUBDURAL;  Surgeon: Consuella Lose, MD;  Location: [facility];  Service: Neurosurgery;  Laterality: Right;  ? ECTOPIC PREGNANCY SURGERY    ? Left axillary cyst removal 1989    ? MULTIPLE EXTRACTIONS WITH ALVEOLOPLASTY N/A 10/25/2017  ? Procedure: EXTRACTION NUMBERS FIVE, SEVEN, EIGHT, NINE, TEN, FIFTEEN, EIGHTEEN WITH ALVEOLOPLASTY, IRRIGATION AND DEBRIDEMENT LEFT SUBMANDIBULAR INFECTION;  Surgeon: Diona Browner,  DDS;  Location: [facility];  Service: Oral Surgery;  Laterality: N/A;  ? PERIPHERAL VASCULAR ATHERECTOMY Right 09/23/2019  ? Procedure: PERIPHERAL VASCULAR ATHERECTOMY;  Surgeon: Wellington Hampshire, MD;  Location: [facility];  Service: Cardiovascular;  Laterality: Right;  SFA  ? PERIPHERAL VASCULAR BALLOON ANGIOPLASTY Right 09/23/2019  ? Procedure: PERIPHERAL VASCULAR BALLOON ANGIOPLASTY;  Surgeon: Wellington Hampshire, MD;  Location: [facility];  Service: Cardiovascular;  Laterality: Right;  SFA  ? RADIOLOGY WITH ANESTHESIA N/A 11/18/2018  ? Procedure: MRI OF THE CERVICAL SPINE WITHOUT CONTRAST;  Surgeon: Radiologist, Medication, MD;  Location: [facility];  Service: Radiology;  Laterality: N/A;  ? RADIOLOGY WITH ANESTHESIA N/A 12/03/2019  ? Procedure: MRI WITH ANESTHESIA CERVICAL WITH AND WITHOUT CONTRAST;  Surgeon: Radiologist, Medication, MD;  Location: [facility];  Service: Radiology;  Laterality: N/A;  ? RADIOLOGY WITH ANESTHESIA N/A 03/02/2021  ? Procedure: MRI CERVICAL SPINE WITH AND WITHOUT CONTRAST;  Surgeon: Radiologist, Medication, MD;  Location: [facility];  Service: Radiology;  Laterality: N/A;  ? TONSILECTOMY, ADENOIDECTOMY, BILATERAL MYRINGOTOMY AND TUBES  1981  ? put not aware of tubes being put in ears  ? TUBAL LIGATION  1981  ? ? ? ?Current Outpatient Medications  ?Medication Sig Dispense Refill  ? albuterol (VENTOLIN HFA) 108 (90 Base) MCG/ACT inhaler INHALE 1 TO 2 PUFFS INTO THE LUNGS EVERY 4 HOURS AS NEEDED FOR WHEEZING (Patient taking differently: Inhale 1-2 puffs into the lungs every 4 (four) hours as needed for wheezing or shortness of breath.) 18 g 5  ? amitriptyline (ELAVIL) 50 MG tablet TAKE 1 TABLET(50 MG) BY MOUTH AT BEDTIME 30 tablet 2  ? Carbamide Peroxide (EAR WAX REMOVAL KIT OT) Place 5-10 drops in ear(s) as needed (ear wax buildup).    ? clopidogrel (PLAVIX) 75 MG tablet Take 1 tablet (75 mg total) by mouth daily. Patient must schedule appointment for future refills. First attempt 30 tablet 0  ?  cyclobenzaprine (FLEXERIL) 10 MG tablet Take 10 mg by mouth 3 (three) times daily.    ? fexofenadine (ALLEGRA) 180 MG tablet Take 180 mg by mouth in the morning.    ? Omega-3 Fatty Acids (FISH OIL) 1000 MG CAPS Take 1,000 mg by mouth in the morning.    ? topiramate (TOPAMAX) 100 MG tablet TAKE 1 TABLET(100 MG) BY MOUTH TWICE DAILY 90 tablet 2  ? acetaminophen (TYLENOL) 325 MG tablet Take 2 tablets (650 mg total) by mouth every 4 (four) hours as needed for mild pain (temp > 100.5). (Patient not taking: Reported on 11/21/2021)    ? atorvastatin (LIPITOR) 80 MG tablet TAKE 1 TABLET (80 MG TOTAL) BY MOUTH DAILY. (Patient not taking: Reported on 11/21/2021) 30 tablet 0  ? mirabegron ER (MYRBETRIQ) 25 MG TB24 tablet Take 1 tablet (25 mg total) by mouth daily. (Patient not taking: Reported on 11/21/2021) 30 tablet 2  ? pantoprazole (PROTONIX) 40 MG tablet TAKE 1 TABLET (40 MG TOTAL) BY MOUTH DAILY. (Patient  not taking: Reported on 11/21/2021) 30 tablet 0  ? tiZANidine (ZANAFLEX) 4 MG tablet Take 0.5 tablets (2 mg total) by mouth 2 (two) times daily as needed for muscle spasms. (Patient not taking: Reported on 11/21/2021) 30 tablet 2  ? ?No current facility-administered medications for this visit.  ? ? ?Allergies:   Carisoprodol and Duloxetine hcl  ? ? ?Social History:  The patient  reports that she quit smoking about 15 months ago. Her smoking use included cigarettes. She has a 33.00 pack-year smoking history. She has never used smokeless tobacco. She reports that she does not currently use alcohol after a past usage of about 2.0 standard drinks per week. She reports that she does not use drugs.  ? ?Family History:  The patient's family history includes Ovarian cancer in her mother.  ? ? ?ROS:  Please see the history of present illness.   Otherwise, review of systems are positive for none.   All other systems are reviewed and negative.  ? ? ?PHYSICAL EXAM: ?VS:  BP (!) 156/82   Pulse 93   Ht 5' (1.524 m)   Wt 167 lb 12.8 oz  (76.1 kg)   SpO2 99%   BMI 32.77 kg/m?  , BMI Body mass index is 32.77 kg/m?. ?GEN: Well nourished, well developed, in no acute distress  ?HEENT: normal  ?Neck: no JVD, carotid bruits, or masses ?Cardiac:

## 2021-11-21 NOTE — Patient Instructions (Signed)
Medication Instructions:  ?No changes ?*If you need a refill on your cardiac medications before your next appointment, please call your pharmacy* ? ? ?Lab Work: ?None ordered ?If you have labs (blood work) drawn today and your tests are completely normal, you will receive your results only by: ?MyChart Message (if you have MyChart) OR ?A paper copy in the mail ?If you have any lab test that is abnormal or we need to change your treatment, we will call you to review the results. ? ? ?Testing/Procedures: ?Your physician has requested that you have a lower extremity arterial duplex. During this test, ultrasound is used to evaluate arterial blood flow in the legs. Allow one hour for this exam. There are no restrictions or special instructions. This will take place at [address], [address]. ? ?Your physician has requested that you have an ankle brachial index (ABI). During this test an ultrasound and blood pressure cuff are used to evaluate the arteries that supply the arms and legs with blood. Allow thirty minutes for this exam. There are no restrictions or special instructions. This will take place at [place], [address].  ? ?Follow-Up: ?At [facility], you and your health needs are our priority.  As part of our continuing mission to provide you with exceptional heart care, we have created designated Provider Care Teams.  These Care Teams include your primary Cardiologist (physician) and Advanced Practice Providers (APPs -  Physician Assistants and Nurse Practitioners) who all work together to provide you with the care you need, when you need it. ? ?We recommend signing up for the patient portal called "MyChart".  Sign up information is provided on this After Visit Summary.  MyChart is used to connect with patients for Virtual Visits (Telemedicine).  Patients are able to view lab/test results, encounter notes, upcoming appointments, etc.  Non-urgent messages can be sent to your provider as well.    ?To learn more about what you can do with MyChart, go to [email or web address].   ? ?Your next appointment:   ?6 month(s) ? ?The format for your next appointment:   ?In Person ? ?Provider:   ?Kathlyn Sacramento, MD { ? ? ?

## 2021-11-22 ENCOUNTER — Other Ambulatory Visit (HOSPITAL_COMMUNITY): Payer: Self-pay

## 2021-11-22 NOTE — Progress Notes (Signed)
? ?History of Present Illness ?Amy Bruce is a 71 y.o. female with former smoker quit 08/2020 with a 33 pack year smoking history, followed through the lung cancer screening program.  ? ? ?11/24/2021 ?Pt. Presents for follow up. She was last seen by [facility] through the lung cancer screening program 04/2020. She had a head injury 08/2020. She was leaving Dr. Joya Gaskins office ( Vascular Surgery) and she slipped and fell, hit her head and had a sub dural hematoma, she had a right frontoparietal craniotomy evacuation of subdural hematoma 08/23/2020. ?She went to rehab at [facility] and then to [facility] for continuing rehab. She has recently returned to [facility], and is re-establishing her healthcare. She would like to resume lung cancer screening., and she needed medication refills.  She has been using Allegra and Flonase to control post nasal drip. She states she quit smoking 08/2020 after  her hospitalization. I am very proud of her ability to quit.  ?She does have some Post nasal drip, and upper airway Cough which is  is controlled with Allegra and Flonase. We will renew her Albuterol which she rarely uses, but wants to have it just in case ( She uses only if she gets short of breath with exertion.) ? ?Test Results: ?Last Low Dose CT Chest 05/14/2019 ?Lungs/Pleura: Biapical pleuroparenchymal scarring noted. ?Centrilobular emphsyema noted. Patchy ground-glass attenuation is ?noted in the central right upper lobe. No new suspicious pulmonary ?nodule or mass. No focal airspace consolidation. No pleural ?effusion. ?Lung-RADS 1, negative. Continue annual screening with low-dose ?chest CT without contrast in 12 months. ?interval development of patchy ground-glass opacity in the ?central right upper lobe, likely reflecting infectious/inflammatory ?alveolitis. Atypical infection would be a consideration ?  ? ? ?CBC Latest Ref Rng & Units 03/02/2021 09/02/2020 09/01/2020  ?WBC 4.0 - 10.5 K/uL - 10.3 8.8   ?Hemoglobin 12.0 - 15.0 g/dL 12.2 10.6(L) 9.5(L)  ?Hematocrit 36.0 - 46.0 % 36.0 32.5(L) 29.5(L)  ?Platelets 150 - 400 K/uL - 341 276  ? ? ?BMP Latest Ref Rng & Units 03/02/2021 09/02/2020 09/01/2020  ?Glucose 70 - 99 mg/dL 110(H) 130(H) 134(H)  ?BUN 8 - 23 mg/dL _0 ?Creatinine 0.44 - 1.00 mg/dL 0.90 0.73 0.73  ?BUN/Creat Ratio 12 - 28 - - -  ?Sodium 135 - 145 mmol/L 141 138 139  ?Potassium 3.5 - 5.1 mmol/L 3.4(L) 4.0 3.7  ?Chloride 98 - 111 mmol/L 106 99 103  ?CO2 22 - 32 mmol/L - 29 27  ?Calcium 8.9 - 10.3 mg/dL - 9.8 8.9  ? ? ?BNP ?No results found for: BNP ? ?ProBNP ?No results found for: PROBNP ? ?PFT ?   ?Component Value Date/Time  ? FEV1PRE 1.56 04/27/2020 1441  ? FEV1POST 1.65 04/27/2020 1441  ? FVCPRE 2.06 04/27/2020 1441  ? FVCPOST 2.15 04/27/2020 1441  ? TLC 4.02 04/27/2020 1441  ? DLCOUNC 11.77 04/27/2020 1441  ? PREFEV1FVCRT 76 04/27/2020 1441  ? PSTFEV1FVCRT 77 04/27/2020 1441  ? ? ?No results found. ? ? ?Past medical hx ?Past Medical History:  ?Diagnosis Date  ? Anemia   ? in the past  ? Anxiety   ? yrs. ago panic attacks  ? Arthritis   ? Cancer [facility])   ? 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 Complication of anesthesia   ? pt. reported her father became deaf after having anesthesia  ? Fibromyalgia   ? GERD (gastroesophageal reflux disease)   ? Heart murmur   ? due to  rheumatic fever, PCP no longer hears it  ? Hyperlipidemia   ? Hypertension   ? Myalgia   ? Neuropathy   ? PAD (peripheral artery disease) ([place])   ? PAD (peripheral artery disease) ([place])   ? Pre-diabetes   ? Pyelonephritis 09/10/2019  ?  "septic"  ? Tobacco abuse   ? Tuberculosis   ? exposed, has to have chest xray  ?  ? ?Social History  ? ?Tobacco Use  ? Smoking status: Former  ?  Packs/day: 1.00  ?  Years: 33.00  ?  Pack years: 33.00  ?  Types: Cigarettes  ?  Quit date: 08/2020  ?  Years since quitting: 1.2  ? Smokeless tobacco: Never  ? Tobacco comments:  ?  2 ciggs a day  ?Vaping Use  ? Vaping Use: Never used  ?Substance  Use Topics  ? Alcohol use: Not Currently  ?  Alcohol/week: 2.0 standard drinks  ?  Types: 2 Shots of liquor per week  ?  Comment: "not since 2021" - 03/01/21  ? Drug use: No  ? ? ?Amy Bruce reports that she quit smoking about 15 months ago. Her smoking use included cigarettes. She has a 33.00 pack-year smoking history. She has never used smokeless tobacco. She reports that she does not currently use alcohol after a past usage of about 2.0 standard drinks per week. She reports that she does not use drugs. ? ?Tobacco Cessation: ?Former smoker , quit 08/2020 ? ? ?Past surgical hx, Family hx, Social hx all reviewed. ? ?Current Outpatient Medications on File Prior to Visit  ?Medication Sig  ? amitriptyline (ELAVIL) 50 MG tablet TAKE 1 TABLET(50 MG) BY MOUTH AT BEDTIME  ? atorvastatin (LIPITOR) 80 MG tablet TAKE 1 TABLET (80 MG TOTAL) BY MOUTH DAILY.  ? Carbamide Peroxide (EAR WAX REMOVAL KIT OT) Place 5-10 drops in ear(s) as needed (ear wax buildup).  ? clopidogrel (PLAVIX) 75 MG tablet Take 1 tablet (75 mg total) by mouth daily. Patient must schedule appointment for future refills. First attempt  ? cyclobenzaprine (FLEXERIL) 10 MG tablet Take 10 mg by mouth 3 (three) times daily.  ? fexofenadine (ALLEGRA) 180 MG tablet Take 180 mg by mouth in the morning.  ? Omega-3 Fatty Acids (FISH OIL) 1000 MG CAPS Take 1,000 mg by mouth in the morning.  ? topiramate (TOPAMAX) 100 MG tablet TAKE 1 TABLET(100 MG) BY MOUTH TWICE DAILY  ? acetaminophen (TYLENOL) 325 MG tablet Take 2 tablets (650 mg total) by mouth every 4 (four) hours as needed for mild pain (temp > 100.5). (Patient not taking: Reported on 11/21/2021)  ? mirabegron ER (MYRBETRIQ) 25 MG TB24 tablet Take 1 tablet (25 mg total) by mouth daily. (Patient not taking: Reported on 11/21/2021)  ? pantoprazole (PROTONIX) 40 MG tablet TAKE 1 TABLET (40 MG TOTAL) BY MOUTH DAILY. (Patient not taking: Reported on 11/21/2021)  ? tiZANidine (ZANAFLEX) 4 MG tablet Take 0.5 tablets (2 mg  total) by mouth 2 (two) times daily as needed for muscle spasms. (Patient not taking: Reported on 11/21/2021)  ? ?No current facility-administered medications on file prior to visit.  ?  ? ?Allergies  ?Allergen Reactions  ? Carisoprodol Swelling  ?  Other reaction(s): tongue swelling  ? Duloxetine Hcl Swelling and Hypertension  ? ? ?Review Of Systems: ? ?Constitutional:   No  weight loss, night sweats,  Fevers, chills, fatigue, or  lassitude. ? ?HEENT:   No headaches,  Difficulty swallowing,  Tooth/dental problems, or  Sore throat,  ?  No sneezing, itching, ear ache, nasal congestion, + post nasal drip,  ? ?CV:  No chest pain,  Orthopnea, PND, swelling in lower extremities, anasarca, dizziness, palpitations, syncope.  ? ?GI  No heartburn, indigestion, abdominal pain, nausea, vomiting, diarrhea, change in bowel habits, loss of appetite, bloody stools.  ? ?Resp: occasional + shortness of breath with exertion not  at rest.  No excess mucus, no productive cough,  No non-productive cough,  No coughing up of blood.  No change in color of mucus.  No wheezing.  No chest wall deformity ? ?Skin: no rash or lesions. ? ?GU: no dysuria, change in color of urine, no urgency or frequency.  No flank pain, no hematuria  ? ?MS:  No joint pain or swelling.  No decreased range of motion.  No back pain. ? ?Psych:  No change in mood or affect. No depression or anxiety.  No memory loss. ? ? ?Vital Signs ?BP 140/68 (BP Location: Right Arm, Patient Position: Sitting, Cuff Size: Normal)   Pulse 89   Temp 98.2 ?F (36.8 ?C) (Oral)   Ht 5' (1.524 m)   Wt 167 lb 6.4 oz (75.9 kg)   SpO2 100%   BMI 32.69 kg/m?  ? ? ?Physical Exam: ? ?General- No distress,  A&Ox3, pleasant ?ENT: No sinus tenderness, TM clear, pale nasal mucosa, no oral exudate,no post nasal drip, no LAN ?Cardiac: S1, S2, regular rate and rhythm, no murmur ?Chest: No wheeze/ rales/ dullness; no accessory muscle use, no nasal flaring, no sternal retractions ?Abd.:  Soft Non-tender, ND, BS +, Body mass index is 32.69 kg/m?. ?Ext: No clubbing cyanosis, edema ?Neuro:  normal strength, slightly deconditioned walking with a cane after her head injury, but continues to improve ?Skin: No

## 2021-11-24 ENCOUNTER — Ambulatory Visit (INDEPENDENT_AMBULATORY_CARE_PROVIDER_SITE_OTHER): Payer: Medicare Other | Admitting: Acute Care

## 2021-11-24 ENCOUNTER — Other Ambulatory Visit: Payer: Self-pay

## 2021-11-24 ENCOUNTER — Encounter: Payer: Self-pay | Admitting: Acute Care

## 2021-11-24 VITALS — BP 140/68 | HR 89 | Temp 98.2°F | Ht 60.0 in | Wt 167.4 lb

## 2021-11-24 DIAGNOSIS — Z122 Encounter for screening for malignant neoplasm of respiratory organs: Secondary | ICD-10-CM | POA: Diagnosis not present

## 2021-11-24 DIAGNOSIS — Z87891 Personal history of nicotine dependence: Secondary | ICD-10-CM

## 2021-11-24 DIAGNOSIS — R0982 Postnasal drip: Secondary | ICD-10-CM

## 2021-11-24 MED ORDER — ALBUTEROL SULFATE HFA 108 (90 BASE) MCG/ACT IN AERS: INHALATION_SPRAY | RESPIRATORY_TRACT | 5 refills | Status: DC

## 2021-11-24 NOTE — Patient Instructions (Addendum)
It is good to see you today. ?We will refill your Albuterol. ?Use as needed up to 3 times daily for shortness of breath or wheezing.  ?I have placed your order for the low dose CT  ?You will get a call to get this scheduled.  ?Congratulation on quitting smoking!!  ?I am so proud of you. ?Please get some medical directive paperwork from [facility] so that you can establish your daughter as your HCPOA.   ?Continue Allegra daily for post nasal drip. ?Continue Flonase generic ( Fluticasone) daily as you have been doing.  ?Follow up in 6 months with Judson Roch NP.  ?Please contact office for sooner follow up if symptoms do not improve or worsen or seek emergency care   ?

## 2021-12-04 DIAGNOSIS — R7309 Other abnormal glucose: Secondary | ICD-10-CM | POA: Diagnosis not present

## 2021-12-04 DIAGNOSIS — E785 Hyperlipidemia, unspecified: Secondary | ICD-10-CM | POA: Diagnosis not present

## 2021-12-04 DIAGNOSIS — I739 Peripheral vascular disease, unspecified: Secondary | ICD-10-CM | POA: Diagnosis not present

## 2021-12-04 DIAGNOSIS — R040 Epistaxis: Secondary | ICD-10-CM | POA: Diagnosis not present

## 2021-12-04 DIAGNOSIS — Z1211 Encounter for screening for malignant neoplasm of colon: Secondary | ICD-10-CM | POA: Diagnosis not present

## 2021-12-04 DIAGNOSIS — J439 Emphysema, unspecified: Secondary | ICD-10-CM | POA: Diagnosis not present

## 2021-12-04 DIAGNOSIS — R42 Dizziness and giddiness: Secondary | ICD-10-CM | POA: Diagnosis not present

## 2021-12-04 DIAGNOSIS — I10 Essential (primary) hypertension: Secondary | ICD-10-CM | POA: Diagnosis not present

## 2021-12-04 DIAGNOSIS — Z0000 Encounter for general adult medical examination without abnormal findings: Secondary | ICD-10-CM | POA: Diagnosis not present

## 2021-12-04 DIAGNOSIS — I700 Atherosclerosis of aorta: Secondary | ICD-10-CM | POA: Diagnosis not present

## 2021-12-04 DIAGNOSIS — H6120 Impacted cerumen, unspecified ear: Secondary | ICD-10-CM | POA: Diagnosis not present

## 2021-12-09 ENCOUNTER — Other Ambulatory Visit: Payer: Self-pay | Admitting: Cardiovascular Disease

## 2021-12-11 ENCOUNTER — Telehealth: Payer: Self-pay | Admitting: Cardiovascular Disease

## 2021-12-11 MED ORDER — CLOPIDOGREL BISULFATE 75 MG PO TABS: 75.0000 mg | ORAL_TABLET | Freq: Every day | ORAL | 3 refills | Status: DC

## 2021-12-11 NOTE — Telephone Encounter (Signed)
Refill request

## 2021-12-11 NOTE — Telephone Encounter (Signed)
?*  STAT* If patient is at the pharmacy, call can be transferred to refill team. ? ? ?1. Which medications need to be refilled? (please list name of each medication and dose if known) clopidogrel (PLAVIX) 75 MG tablet ? ?2. Which pharmacy/location (including street and city if local pharmacy) is medication to be sent to? [id number] - [place], [place] ? ?3. Do they need a 30 day or 90 day supply? 30 day ? ? ? ? ? ?

## 2021-12-12 NOTE — Telephone Encounter (Signed)
Patient is requesting a refill on her plavix. The medication has already been refilled for the next year. ?

## 2021-12-12 NOTE — Telephone Encounter (Signed)
Spoke with the patient and she is aware that her medication was refilled yesterday and she does have her medication and she is aware of the refills on it.  ?

## 2021-12-14 ENCOUNTER — Inpatient Hospital Stay: Admission: RE | Admit: 2021-12-14 | Payer: Medicare Other | Source: Ambulatory Visit

## 2021-12-14 ENCOUNTER — Other Ambulatory Visit: Payer: Self-pay | Admitting: Family Medicine

## 2021-12-14 DIAGNOSIS — Z1231 Encounter for screening mammogram for malignant neoplasm of breast: Secondary | ICD-10-CM

## 2021-12-15 ENCOUNTER — Ambulatory Visit
Admission: RE | Admit: 2021-12-15 | Discharge: 2021-12-15 | Disposition: A | Discharge status: Home / Self Care | Payer: Medicare Other | Source: Ambulatory Visit | Attending: Family Medicine | Admitting: Family Medicine

## 2021-12-15 DIAGNOSIS — Z1231 Encounter for screening mammogram for malignant neoplasm of breast: Secondary | ICD-10-CM | POA: Diagnosis not present

## 2021-12-18 ENCOUNTER — Ambulatory Visit: Payer: Medicare Other

## 2021-12-18 ENCOUNTER — Encounter (HOSPITAL_COMMUNITY): Payer: Medicare Other

## 2021-12-19 ENCOUNTER — Ambulatory Visit (HOSPITAL_COMMUNITY)
Admission: RE | Admit: 2021-12-19 | Discharge: 2021-12-19 | Disposition: A | Discharge status: Home / Self Care | Payer: Medicare Other | Source: Ambulatory Visit | Attending: Internal Medicine | Admitting: Internal Medicine

## 2021-12-19 DIAGNOSIS — Z9862 Peripheral vascular angioplasty status: Secondary | ICD-10-CM | POA: Insufficient documentation

## 2021-12-19 DIAGNOSIS — Z95820 Peripheral vascular angioplasty status with implants and grafts: Secondary | ICD-10-CM

## 2021-12-26 ENCOUNTER — Other Ambulatory Visit: Payer: Self-pay | Admitting: Registered Nurse

## 2021-12-28 ENCOUNTER — Other Ambulatory Visit: Payer: Self-pay | Admitting: *Deleted

## 2021-12-28 DIAGNOSIS — I739 Peripheral vascular disease, unspecified: Secondary | ICD-10-CM

## 2022-01-18 ENCOUNTER — Other Ambulatory Visit: Payer: Self-pay | Admitting: Registered Nurse

## 2022-02-26 ENCOUNTER — Other Ambulatory Visit: Payer: Self-pay | Admitting: Registered Nurse

## 2022-05-15 ENCOUNTER — Ambulatory Visit: Payer: Medicare Other | Attending: Cardiovascular Disease | Admitting: Cardiovascular Disease

## 2022-05-15 ENCOUNTER — Encounter: Payer: Self-pay | Admitting: Cardiovascular Disease

## 2022-05-15 VITALS — BP 114/68 | HR 96 | Ht 61.0 in | Wt 162.6 lb

## 2022-05-15 DIAGNOSIS — I10 Essential (primary) hypertension: Secondary | ICD-10-CM | POA: Diagnosis not present

## 2022-05-15 DIAGNOSIS — I739 Peripheral vascular disease, unspecified: Secondary | ICD-10-CM

## 2022-05-15 DIAGNOSIS — E785 Hyperlipidemia, unspecified: Secondary | ICD-10-CM | POA: Diagnosis not present

## 2022-05-15 DIAGNOSIS — I872 Venous insufficiency (chronic) (peripheral): Secondary | ICD-10-CM | POA: Diagnosis not present

## 2022-05-15 NOTE — Progress Notes (Signed)
Cardiology Office Note   Date:  05/15/2022   ID:  Amy Bruce, Amy Bruce 08/08/1951, MRN [id number]  PCP:  London Pepper, MD  Cardiologist:   Kathlyn Sacramento, MD   No chief complaint on file.     History of Present Illness: Amy Bruce is a 71 y.o. female who is here today for a follow-up visit regarding peripheral arterial disease.  She has known history of peripheral arterial disease, tobacco use, essential hypertension and hyperlipidemia.  Previous CT imaging showed evidence of aortic and coronary calcifications.  She has prolonged history of plantar fasciitis. She has known history of peripheral arterial disease with claudication. Angiography in July 2019 showed diffuse right SFA disease with medium length occlusion in the mid to distal segment.  On the left there was moderate ostial left SFA stenosis with two-vessel runoff below the knee.  She was referred to vascular surgery to consider right femoral-popliteal bypass but medical therapy was pursued after.   Lexiscan Myoview in September 2019 showed no evidence of ischemia with normal ejection fraction. She had back surgery done in 2022.   She had worsening claudication in 2021.  Angiography was done in January 2021 which showed no significant change from previous angiography.  I performed successful orbital atherectomy to the proximal and mid right SFA followed by drug-coated balloon angioplasty.  The distal SFA was treated with drug-eluting stent given that the reentry was subintimal.  Postprocedure ABI improved to normal with mildly decreased toe pressure.  She quit smoking in December, 2022.   She had recurrent right leg discomfort thought to be neuropathic.  Most recent vascular studies in April showed an ABI of 0.95 on the right and 0.88 on the left.  Duplex showed patent right SFA with no significant obstructive disease.  She has been doing well with no chest pain or shortness of breath.  She has occasional fast heartbeats  120 bpm detected by her smart watch with minimal palpitations.  Usually this is with exertion but she has no other associated symptoms.  Past Medical History:  Diagnosis Date   Anemia    in the past   Anxiety    yrs. 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 reported her father became deaf after having anesthesia   Fibromyalgia    GERD (gastroesophageal reflux disease)    Heart murmur    due to rheumatic fever, PCP no longer hears it   Hyperlipidemia    Hypertension    Myalgia    Neuropathy    PAD (peripheral artery disease) ([facility])    PAD (peripheral artery disease) ([place])    Pre-diabetes    Pyelonephritis 09/10/2019    "septic"   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, has to have chest xray    Past Surgical History:  Procedure Laterality Date   ABDOMINAL AORTOGRAM W/LOWER EXTREMITY N/A 04/02/2018   Procedure: ABDOMINAL AORTOGRAM W/LOWER EXTREMITY;  Surgeon: Wellington Hampshire, MD;  Location: [place] [facility];  Service: Cardiovascular;  Laterality: N/A;   ABDOMINAL AORTOGRAM W/LOWER EXTREMITY N/A 09/23/2019   Procedure: ABDOMINAL AORTOGRAM W/LOWER EXTREMITY;  Surgeon: Wellington Hampshire, MD;  Location: [place] [facility];  Service: Cardiovascular;  Laterality: N/A;   [place] SURGERY  [phone]   C5/6 6/7   COLONOSCOPY  CRANIOTOMY Right 08/23/2020   Procedure: RIGHT CRANIOTOMY HEMATOMA EVACUATION SUBDURAL;  Surgeon: Consuella Lose, MD;  Location: [place];  Service: Neurosurgery;  Laterality: Right;   ECTOPIC PREGNANCY SURGERY     Left axillary cyst removal 1989     MULTIPLE EXTRACTIONS WITH ALVEOLOPLASTY N/A 10/25/2017   Procedure: EXTRACTION NUMBERS FIVE, SEVEN, EIGHT, NINE, TEN, FIFTEEN, EIGHTEEN WITH ALVEOLOPLASTY, IRRIGATION AND DEBRIDEMENT LEFT SUBMANDIBULAR INFECTION;  Surgeon: Diona Browner, DDS;  Location: [place];  Service: Oral Surgery;   Laterality: N/A;   PERIPHERAL VASCULAR ATHERECTOMY Right 09/23/2019   Procedure: PERIPHERAL VASCULAR ATHERECTOMY;  Surgeon: Wellington Hampshire, MD;  Location: [place];  Service: Cardiovascular;  Laterality: Right;  SFA   PERIPHERAL VASCULAR BALLOON ANGIOPLASTY Right 09/23/2019   Procedure: PERIPHERAL VASCULAR BALLOON ANGIOPLASTY;  Surgeon: Wellington Hampshire, MD;  Location: [place] [facility];  Service: Cardiovascular;  Laterality: Right;  SFA   RADIOLOGY WITH ANESTHESIA N/A 11/18/2018   Procedure: MRI OF THE CERVICAL SPINE WITHOUT CONTRAST;  Surgeon: Radiologist, Medication, MD;  Location: [place];  Service: Radiology;  Laterality: N/A;   RADIOLOGY WITH ANESTHESIA N/A 12/03/2019   Procedure: MRI WITH ANESTHESIA CERVICAL WITH AND WITHOUT CONTRAST;  Surgeon: Radiologist, Medication, MD;  Location: [place];  Service: Radiology;  Laterality: N/A;   RADIOLOGY WITH ANESTHESIA N/A 03/02/2021   Procedure: MRI CERVICAL SPINE WITH AND WITHOUT CONTRAST;  Surgeon: Radiologist, Medication, MD;  Location: [place];  Service: Radiology;  Laterality: N/A;   TONSILECTOMY, ADENOIDECTOMY, BILATERAL MYRINGOTOMY AND TUBES  1981   put not aware of tubes being put in ears   TUBAL LIGATION  1981     Current Outpatient Medications  Medication Sig Dispense Refill   albuterol (VENTOLIN HFA) 108 (90 Base) MCG/ACT inhaler INHALE 1 TO 2 PUFFS INTO THE LUNGS EVERY 4 HOURS AS NEEDED FOR WHEEZING 18 g 5   atorvastatin (LIPITOR) 80 MG tablet TAKE 1 TABLET (80 MG TOTAL) BY MOUTH DAILY. 90 tablet 3   Carbamide Peroxide (EAR WAX REMOVAL KIT OT) Place 5-10 drops in ear(s) as needed (ear wax buildup).     clopidogrel (PLAVIX) 75 MG tablet Take 1 tablet (75 mg total) by mouth daily. 90 tablet 3   cyclobenzaprine (FLEXERIL) 10 MG tablet Take 10 mg by mouth 3 (three) times daily.     fexofenadine (ALLEGRA) 180 MG tablet Take 180 mg by mouth in the morning.     Omega-3 Fatty Acids (FISH OIL) 1000 MG CAPS Take 1,000 mg by mouth in the  morning.     topiramate (TOPAMAX) 100 MG tablet TAKE 1 TABLET(100 MG) BY MOUTH TWICE DAILY 90 tablet 2   acetaminophen (TYLENOL) 325 MG tablet Take 2 tablets (650 mg total) by mouth every 4 (four) hours as needed for mild pain (temp > 100.5). (Patient not taking: Reported on 11/21/2021)     amitriptyline (ELAVIL) 50 MG tablet TAKE 1 TABLET(50 MG) BY MOUTH AT BEDTIME (Patient not taking: Reported on 05/15/2022) 30 tablet 0   mirabegron ER (MYRBETRIQ) 25 MG TB24 tablet Take 1 tablet (25 mg total) by mouth daily. (Patient not taking: Reported on 11/21/2021) 30 tablet 2   pantoprazole (PROTONIX) 40 MG tablet TAKE 1 TABLET (40 MG TOTAL) BY MOUTH DAILY. (Patient not taking: Reported on 11/21/2021) 30 tablet 0   tiZANidine (ZANAFLEX) 4 MG tablet Take 0.5 tablets (2 mg total) by mouth 2 (two) times daily as needed for muscle spasms. (Patient not taking: Reported on 11/21/2021) 30 tablet 2   No current  facility-administered medications for this visit.    Allergies:   Carisoprodol and Duloxetine hcl    Social History:  The patient  reports that she quit smoking about 21 months ago. Her smoking use included cigarettes. She has a 33.00 pack-year smoking history. She has never used smokeless tobacco. She reports that she does not currently use alcohol after a past usage of about 2.0 standard drinks of alcohol per week. She reports that she does not use drugs.   Family History:  The patient's family history includes Ovarian cancer in her mother.    ROS:  Please see the history of present illness.   Otherwise, review of systems are positive for none.   All other systems are reviewed and negative.    PHYSICAL EXAM: VS:  BP 114/68   Pulse 96   Ht _0  (1.549 m)   Wt 162 lb 9.6 oz (73.8 kg)   SpO2 (!) 83%   BMI 30.72 kg/m  , BMI Body mass index is 30.72 kg/m. GEN: Well nourished, well developed, in no acute distress  HEENT: normal  Neck: no JVD, carotid bruits, or masses Cardiac: RRR; no murmurs, rubs, or  gallops Respiratory:  clear to auscultation bilaterally, normal work of breathing GI: soft, nontender, nondistended, + BS MS: no deformity or atrophy  Skin: warm and dry, no rash Neuro:  Strength and sensation are intact Psych: euthymic mood, full affect Vascular: Femoral pulses are +1 bilaterally.  Distal pulses are not palpable   EKG:  EKG is not ordered today.   Recent Labs: No results found for requested labs within last 365 days.    Lipid Panel No results found for: "CHOL", "TRIG", "HDL", "CHOLHDL", "VLDL", "LDLCALC", "LDLDIRECT"    Wt Readings from Last 3 Encounters:  05/15/22 162 lb 9.6 oz (73.8 kg)  11/24/21 167 lb 6.4 oz (75.9 kg)  11/21/21 167 lb 12.8 oz (76.1 kg)           No data to display            ASSESSMENT AND PLAN:  1. Peripheral arterial disease: Status post right SFA endovascular intervention for severe right calf claudication.  Most recent Doppler studies in April showed near normal ABI with patent SFA.  Repeat Doppler studies in April 2024.   2.  Previous tobacco use: No relapse.  3.  Hyperlipidemia: Continue high-dose atorvastatin with a target LDL of less than 70.  4.  Essential hypertension: Blood pressure is well controlled.  5.  Chronic venous insufficiency: She uses knee-high support stockings as needed.  5.  Elevated heart rate: This is incidental finding on her smart watch with minimal palpitations.  Discussed the maximal heart rate for age.   Disposition:   FU with me in 6 months.  Signed,  Kathlyn Sacramento, MD  05/15/2022 8:44 AM    [facility]

## 2022-05-15 NOTE — Patient Instructions (Signed)
Medication Instructions:  No changes *If you need a refill on your cardiac medications before your next appointment, please call your pharmacy*   Lab Work: None ordered If you have labs (blood work) drawn today and your tests are completely normal, you will receive your results only by: MyChart Message (if you have MyChart) OR A paper copy in the mail If you have any lab test that is abnormal or we need to change your treatment, we will call you to review the results.   Testing/Procedures: None ordered   Follow-Up: At [facility], you and your health needs are our priority.  As part of our continuing mission to provide you with exceptional heart care, we have created designated Provider Care Teams.  These Care Teams include your primary Cardiologist (physician) and Advanced Practice Providers (APPs -  Physician Assistants and Nurse Practitioners) who all work together to provide you with the care you need, when you need it.  We recommend signing up for the patient portal called "MyChart".  Sign up information is provided on this After Visit Summary.  MyChart is used to connect with patients for Virtual Visits (Telemedicine).  Patients are able to view lab/test results, encounter notes, upcoming appointments, etc.  Non-urgent messages can be sent to your provider as well.   To learn more about what you can do with MyChart, go to [email or web address].    Your next appointment:   6 month(s)  The format for your next appointment:   In Person  Provider:   Muhammad Arida, MD      Important Information About Sugar       

## 2022-06-11 ENCOUNTER — Other Ambulatory Visit: Payer: Self-pay | Admitting: Acute Care

## 2022-06-18 ENCOUNTER — Encounter: Payer: Self-pay | Admitting: Registered Nurse

## 2022-06-18 ENCOUNTER — Encounter: Payer: Medicare Other | Attending: Registered Nurse | Admitting: Registered Nurse

## 2022-06-18 VITALS — BP 145/85 | HR 72 | Ht 62.0 in | Wt 162.0 lb

## 2022-06-18 DIAGNOSIS — M5412 Radiculopathy, cervical region: Secondary | ICD-10-CM | POA: Diagnosis not present

## 2022-06-18 DIAGNOSIS — M546 Pain in thoracic spine: Secondary | ICD-10-CM | POA: Diagnosis not present

## 2022-06-18 DIAGNOSIS — G8929 Other chronic pain: Secondary | ICD-10-CM | POA: Insufficient documentation

## 2022-06-18 DIAGNOSIS — M542 Cervicalgia: Secondary | ICD-10-CM | POA: Insufficient documentation

## 2022-06-18 DIAGNOSIS — M5416 Radiculopathy, lumbar region: Secondary | ICD-10-CM | POA: Insufficient documentation

## 2022-06-18 MED ORDER — TOPIRAMATE 25 MG PO TABS: 25.0000 mg | ORAL_TABLET | Freq: Two times a day (BID) | ORAL | 2 refills | Status: DC

## 2022-06-18 NOTE — Progress Notes (Signed)
Subjective:    Patient ID: Amy Bruce, female    DOB: 08/31/1951, 71 y.o.   MRN: [id number]  HPI: Amy Bruce is a 71 y.o. female who returns for follow up appointment for chronic pain and medication refill. She states her pain is located in her upper- lower back and left hip pain. She also reports she has a occasionally headache. She rates her pain 8. Her current exercise regime is walking with her cane  and performing stretching exercises.  Ms. Bosch reports she ran out of her Topamax in July or August, Topamax was resumed today. She was instructed to send a My-Chart message in a week, she verbalizes understanding.   She also reports she has a scheduled appointment with Dr Kathyrn Sheriff , she will send a My-Chart message with a update, she verbalizes understanding.     Pain Inventory Average Pain 8 Pain Right Now 8 My pain is burning, stabbing, tingling, and aching  In the last 24 hours, has pain interfered with the following? General activity 8 Relation with others 0 Enjoyment of life 8 What TIME of day is your pain at its worst? morning , daytime, evening, and night Sleep (in general) Fair  Pain is worse with: bending, inactivity, standing, and some activites Pain improves with: pacing activities and TENS Relief from Meds: 4  Family History  Problem Relation Age of Onset   Ovarian cancer Mother    Social History   Socioeconomic History   Marital status: Divorced    Spouse name: Not on file   Number of children: Not on file   Years of education: Not on file   Highest education level: Not on file  Occupational History   Not on file  Tobacco Use   Smoking status: Former    Packs/day: 1.00    Years: 33.00    Total pack years: 33.00    Types: Cigarettes    Quit date: 08/2020    Years since quitting: 1.8   Smokeless tobacco: Never   Tobacco comments:    2 ciggs a day  Vaping Use   Vaping Use: Never used  Substance and Sexual Activity   Alcohol use: Not  Currently    Alcohol/week: 2.0 standard drinks of alcohol    Types: 2 Shots of liquor per week    Comment: "not since 2021" - 03/01/21   Drug use: No   Sexual activity: Not on file  Other Topics Concern   Not on file  Social History Narrative   Not on file   Social Determinants of Health   Financial Resource Strain: Not on file  Food Insecurity: Not on file  Transportation Needs: Not on file  Physical Activity: Not on file  Stress: Not on file  Social Connections: Not on file   Past Surgical History:  Procedure Laterality Date   ABDOMINAL AORTOGRAM W/LOWER EXTREMITY N/A 04/02/2018   Procedure: ABDOMINAL AORTOGRAM W/LOWER EXTREMITY;  Surgeon: Wellington Hampshire, MD;  Location: [facility];  Service: Cardiovascular;  Laterality: N/A;   ABDOMINAL AORTOGRAM W/LOWER EXTREMITY N/A 09/23/2019   Procedure: ABDOMINAL AORTOGRAM W/LOWER EXTREMITY;  Surgeon: Wellington Hampshire, MD;  Location: [facility];  Service: Cardiovascular;  Laterality: N/A;   ABDOMINAL HYSTERECTOMY  1999   CERVICAL SPINE SURGERY  2683,4196   C5/6 6/7   COLONOSCOPY     CRANIOTOMY Right 08/23/2020   Procedure: RIGHT CRANIOTOMY HEMATOMA EVACUATION SUBDURAL;  Surgeon: Consuella Lose, MD;  Location: [facility];  Service:  Neurosurgery;  Laterality: Right;   ECTOPIC PREGNANCY SURGERY     Left axillary cyst removal 1989     MULTIPLE EXTRACTIONS WITH ALVEOLOPLASTY N/A 10/25/2017   Procedure: EXTRACTION NUMBERS FIVE, SEVEN, EIGHT, NINE, TEN, FIFTEEN, EIGHTEEN WITH ALVEOLOPLASTY, IRRIGATION AND DEBRIDEMENT LEFT SUBMANDIBULAR INFECTION;  Surgeon: Diona Browner, DDS;  Location: [facility];  Service: Oral Surgery;  Laterality: N/A;   PERIPHERAL VASCULAR ATHERECTOMY Right 09/23/2019   Procedure: PERIPHERAL VASCULAR ATHERECTOMY;  Surgeon: Wellington Hampshire, MD;  Location: [facility];  Service: Cardiovascular;  Laterality: Right;  SFA   PERIPHERAL VASCULAR BALLOON ANGIOPLASTY Right 09/23/2019   Procedure: PERIPHERAL  VASCULAR BALLOON ANGIOPLASTY;  Surgeon: Wellington Hampshire, MD;  Location: [facility];  Service: Cardiovascular;  Laterality: Right;  SFA   RADIOLOGY WITH ANESTHESIA N/A 11/18/2018   Procedure: MRI OF THE CERVICAL SPINE WITHOUT CONTRAST;  Surgeon: Radiologist, Medication, MD;  Location: [facility];  Service: Radiology;  Laterality: N/A;   RADIOLOGY WITH ANESTHESIA N/A 12/03/2019   Procedure: MRI WITH ANESTHESIA CERVICAL WITH AND WITHOUT CONTRAST;  Surgeon: Radiologist, Medication, MD;  Location: [facility];  Service: Radiology;  Laterality: N/A;   RADIOLOGY WITH ANESTHESIA N/A 03/02/2021   Procedure: MRI CERVICAL SPINE WITH AND WITHOUT CONTRAST;  Surgeon: Radiologist, Medication, MD;  Location: [facility];  Service: Radiology;  Laterality: N/A;   TONSILECTOMY, ADENOIDECTOMY, BILATERAL MYRINGOTOMY AND TUBES  1981   put not aware of tubes being put in [place]   Past Surgical History:  Procedure Laterality Date   ABDOMINAL AORTOGRAM W/LOWER EXTREMITY N/A 04/02/2018   Procedure: ABDOMINAL AORTOGRAM W/LOWER EXTREMITY;  Surgeon: Wellington Hampshire, MD;  Location: [facility];  Service: Cardiovascular;  Laterality: N/A;   ABDOMINAL AORTOGRAM W/LOWER EXTREMITY N/A 09/23/2019   Procedure: ABDOMINAL AORTOGRAM W/LOWER EXTREMITY;  Surgeon: Wellington Hampshire, MD;  Location: [facility];  Service: Cardiovascular;  Laterality: N/A;   ABDOMINAL HYSTERECTOMY  1999   CERVICAL SPINE SURGERY  [id number]   C5/6 6/7   COLONOSCOPY     CRANIOTOMY Right 08/23/2020   Procedure: RIGHT CRANIOTOMY HEMATOMA EVACUATION SUBDURAL;  Surgeon: Consuella Lose, MD;  Location: [facility];  Service: Neurosurgery;  Laterality: Right;   ECTOPIC PREGNANCY SURGERY     Left axillary cyst removal 1989     MULTIPLE EXTRACTIONS WITH ALVEOLOPLASTY N/A 10/25/2017   Procedure: EXTRACTION NUMBERS FIVE, SEVEN, EIGHT, NINE, TEN, FIFTEEN, EIGHTEEN WITH ALVEOLOPLASTY, IRRIGATION AND DEBRIDEMENT LEFT SUBMANDIBULAR INFECTION;   Surgeon: Diona Browner, DDS;  Location: [facility];  Service: Oral Surgery;  Laterality: N/A;   PERIPHERAL VASCULAR ATHERECTOMY Right 09/23/2019   Procedure: PERIPHERAL VASCULAR ATHERECTOMY;  Surgeon: Wellington Hampshire, MD;  Location: [facility];  Service: Cardiovascular;  Laterality: Right;  SFA   PERIPHERAL VASCULAR BALLOON ANGIOPLASTY Right 09/23/2019   Procedure: PERIPHERAL VASCULAR BALLOON ANGIOPLASTY;  Surgeon: Wellington Hampshire, MD;  Location: [facility];  Service: Cardiovascular;  Laterality: Right;  SFA   RADIOLOGY WITH ANESTHESIA N/A 11/18/2018   Procedure: MRI OF THE CERVICAL SPINE WITHOUT CONTRAST;  Surgeon: Radiologist, Medication, MD;  Location: [facility];  Service: Radiology;  Laterality: N/A;   RADIOLOGY WITH ANESTHESIA N/A 12/03/2019   Procedure: MRI WITH ANESTHESIA CERVICAL WITH AND WITHOUT CONTRAST;  Surgeon: Radiologist, Medication, MD;  Location: [facility];  Service: Radiology;  Laterality: N/A;   RADIOLOGY WITH ANESTHESIA N/A 03/02/2021   Procedure: MRI CERVICAL SPINE WITH AND WITHOUT CONTRAST;  Surgeon: Radiologist, Medication, MD;  Location:  [facility];  Service: Radiology;  Laterality: N/A;   TONSILECTOMY, ADENOIDECTOMY, BILATERAL MYRINGOTOMY AND TUBES  1981   put not aware of tubes being put in [place]   Past Medical History:  Diagnosis Date   Anemia    in the past   Anxiety    yrs. 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 reported her father became deaf after having anesthesia   Fibromyalgia    GERD (gastroesophageal reflux disease)    Heart murmur    due to rheumatic fever, PCP no longer hears it   Hyperlipidemia    Hypertension    Myalgia    Neuropathy    PAD (peripheral artery disease) ([facility])    PAD (peripheral artery disease) ([place])    Pre-diabetes    Pyelonephritis 09/10/2019    "septic"   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, has to have chest  xray   BP (!) 165/88   Pulse 84   Ht '5\' 2"'$  (1.575 m)   Wt 162 lb (73.5 kg)   SpO2 98%   BMI 29.63 kg/m   Opioid Risk Score:   Fall Risk Score:  `1  Depression screen PHQ 2/9     06/18/2022    1:38 PM 05/25/2021    8:31 AM 02/22/2021    8:26 AM 01/02/2021    1:35 PM 04/14/2020   10:18 AM 11/20/2018   10:53 AM 12/12/2016   10:04 AM  Depression screen PHQ 2/9  Decreased Interest 0 0 0 0 0 0 0  Down, Depressed, Hopeless 0 0 0 0 0 0 0  PHQ - 2 Score 0 0 0 0 0 0 0     Review of Systems  Constitutional: Negative.   HENT: Negative.    Eyes: Negative.   Respiratory: Negative.    Cardiovascular: Negative.   Gastrointestinal: Negative.   Endocrine: Negative.   Genitourinary: Negative.   Musculoskeletal:  Positive for back pain and neck pain.       Both arms  Allergic/Immunologic: Negative.   Neurological: Negative.   Hematological:  Bruises/bleeds easily.       Plavix  Psychiatric/Behavioral: Negative.    All other systems reviewed and are negative.      Objective:   Physical Exam Vitals and nursing note reviewed.  Constitutional:      Appearance: Normal appearance.  Neck:     Comments: Cervical Paraspinal Tenderness: C- 3- C-4 Mainly Left Side Cardiovascular:     Rate and Rhythm: Normal rate and regular rhythm.     Pulses: Normal pulses.     Heart sounds: Normal heart sounds.  Pulmonary:     Effort: Pulmonary effort is normal.     Breath sounds: Normal breath sounds.  Musculoskeletal:     Cervical back: Normal range of motion and neck supple.     Comments: Normal Muscle Bulk and Muscle Testing Reveals:  Upper Extremities: Full ROM and Muscle Strength 5/5  Lumbar Paraspinal Tenderness: L-4-L-5 Lower Extremities: Full ROM and Muscle Strength 5/5 Arise from Table slowly Narrow Based  Gait     Skin:    General: Skin is warm and dry.  Neurological:     Mental Status: She is alert and oriented to person, place, and time.  Psychiatric:        Mood and Affect: Mood  normal.        Behavior: Behavior normal.         Assessment & Plan:  Cervicalgia/ Cervical Radiculitis: No complaints  Today Continue HEP as Tolerated. Continue to Monitor. Neurosurgery Following. 06/18/2022 2. Lumbar Radiculitis: RX: Topamax 25 mg one tablet twice a day . Continue HEP as tolerated. Continue to monitor. Neurosurgery Following. 06/18/2022 3.Muscle Spasm: Continue current medication regimen with Cyclobenzaprine  Continue to monitor. 06/18/2022 4. Numbness and tingling in bilateral upper extremities :No complaints today. Continue current medication regimen. Continue to monitor. 06/18/2022 5.Chronic Pain Syndrome: Continue current medication regimen. Continue to monitor. 06/18/2022  F/U in 2 months

## 2022-06-18 NOTE — Patient Instructions (Signed)
Send a My-Chart message in a week : Update on Topamax  Or call   [phone]

## 2022-07-02 DIAGNOSIS — M4802 Spinal stenosis, cervical region: Secondary | ICD-10-CM | POA: Diagnosis not present

## 2022-07-09 ENCOUNTER — Telehealth: Payer: Self-pay

## 2022-07-09 NOTE — Telephone Encounter (Signed)
Called and left message for patient to call back. Clarify how she is taking Topiramate and if she needs a refill

## 2022-07-10 MED ORDER — TOPIRAMATE 50 MG PO TABS: 50.0000 mg | ORAL_TABLET | Freq: Two times a day (BID) | ORAL | 2 refills | Status: DC

## 2022-07-10 NOTE — Addendum Note (Signed)
Addended by: Bayard Hugger on: 07/10/2022 12:03 PM   Modules accepted: Orders

## 2022-07-10 NOTE — Telephone Encounter (Signed)
Topiramate 25 mg two tablets BID, total 100 mg per day. Patient states she would rather do Topiramate '50mg'$  one tablet BID. Need refill. [facility].   Patient states Dr. Kathyrn Sheriff does not write order for MRI with general anesthesia(w/o contrast), he prefers our office to do it. He also said he will follow up with you about the MRI. He would like patient another one done.

## 2022-07-10 NOTE — Telephone Encounter (Signed)
Topamax was ordered today , Amy Bruce was called, she verbalizes understanding.

## 2022-07-27 ENCOUNTER — Telehealth: Payer: Self-pay | Admitting: Registered Nurse

## 2022-07-27 DIAGNOSIS — M542 Cervicalgia: Secondary | ICD-10-CM

## 2022-07-27 DIAGNOSIS — M5412 Radiculopathy, cervical region: Secondary | ICD-10-CM

## 2022-07-27 NOTE — Telephone Encounter (Signed)
Dr Posey Pronto Note was reviewed with Dr Ranell Patrick.  She agrees with MRI WO Contrast with Sedation.  Call placed to Ms. Lucretia Field, she is aware of the above and verbalizes understanding.

## 2022-08-27 ENCOUNTER — Encounter: Payer: Medicare Other | Attending: Registered Nurse | Admitting: Registered Nurse

## 2022-08-27 ENCOUNTER — Encounter: Payer: Self-pay | Admitting: Registered Nurse

## 2022-08-27 VITALS — BP 135/66 | HR 80 | Ht 62.0 in | Wt 168.0 lb

## 2022-08-27 DIAGNOSIS — R200 Anesthesia of skin: Secondary | ICD-10-CM | POA: Diagnosis not present

## 2022-08-27 DIAGNOSIS — G441 Vascular headache, not elsewhere classified: Secondary | ICD-10-CM

## 2022-08-27 DIAGNOSIS — M542 Cervicalgia: Secondary | ICD-10-CM

## 2022-08-27 DIAGNOSIS — M62838 Other muscle spasm: Secondary | ICD-10-CM | POA: Diagnosis not present

## 2022-08-27 DIAGNOSIS — M546 Pain in thoracic spine: Secondary | ICD-10-CM | POA: Diagnosis not present

## 2022-08-27 DIAGNOSIS — G8929 Other chronic pain: Secondary | ICD-10-CM | POA: Diagnosis not present

## 2022-08-27 DIAGNOSIS — R202 Paresthesia of skin: Secondary | ICD-10-CM | POA: Diagnosis not present

## 2022-08-27 DIAGNOSIS — M5412 Radiculopathy, cervical region: Secondary | ICD-10-CM

## 2022-08-27 DIAGNOSIS — G894 Chronic pain syndrome: Secondary | ICD-10-CM

## 2022-08-27 MED ORDER — TOPIRAMATE 50 MG PO TABS: 50.0000 mg | ORAL_TABLET | Freq: Two times a day (BID) | ORAL | 2 refills | Status: DC

## 2022-08-27 NOTE — H&P (View-Only) (Signed)
Subjective:    Patient ID: Amy Bruce, female    DOB: 19-Nov-1950, 71 y.o.   MRN: [id number]  HPI: Amy Bruce is a 71 y.o. female who returns for follow up appointment for chronic pain and medication refill. She states her pain is located in her neck radiating into her right shoulder, right arm and tingling and burning into her right hand. She rates her pain 10. Her current exercise regime is walking with cane.  Amy Bruce seen Dr. Kathyrn Sheriff on 07/02/2022, she presented to Dr Kathyrn Sheriff with complaints of right sided neck and arm pain. Dr Kathyrn Sheriff recommended MRI of Cervical Spine without contrast, Amy Bruce stated she was unable to have MRI without anesthesia, due to her having a panic attack, when she tried in the past. Dr. Kathyrn Sheriff doesn't order MRI with sedation. In the past Dr Posey Pronto ordered Cervical MRI WO Contrast with sedation ,on  03/02/2021. Dr Posey Pronto is no longer a provider at our office, I spoke with Dr Octavio Manns regarding the above, she agrees with ordering the Cervical MRI WO Contrast with sedation. Order was placed.   Dr Kathyrn Sheriff will review the MRI, and will discuss any further treatment options from a neurosurgical standpoint.      Pain Inventory Average Pain 10 Pain Right Now 10 My pain is constant, sharp, stabbing, tingling, and aching  In the last 24 hours, has pain interfered with the following? General activity 9 Relation with others 10 Enjoyment of life 9 What TIME of day is your pain at its worst? morning , daytime, evening, and night Sleep (in general) Fair  Pain is worse with: walking, bending, sitting, inactivity, standing, and some activites Pain improves with: medication Relief from Meds: 8  Family History  Problem Relation Age of Onset   Ovarian cancer Mother    Social History   Socioeconomic History   Marital status: Divorced    Spouse name: Not on file   Number of children: Not on file   Years of education: Not on file   Highest education  level: Not on file  Occupational History   Not on file  Tobacco Use   Smoking status: Former    Packs/day: 1.00    Years: 33.00    Total pack years: 33.00    Types: Cigarettes    Quit date: 08/2020    Years since quitting: 2.0   Smokeless tobacco: Never   Tobacco comments:    2 ciggs a day  Vaping Use   Vaping Use: Never used  Substance and Sexual Activity   Alcohol use: Not Currently    Alcohol/week: 2.0 standard drinks of alcohol    Types: 2 Shots of liquor per week    Comment: "not since 2021" - 03/01/21   Drug use: No   Sexual activity: Not on file  Other Topics Concern   Not on file  Social History Narrative   Not on file   Social Determinants of Health   Financial Resource Strain: Not on file  Food Insecurity: Not on file  Transportation Needs: Not on file  Physical Activity: Not on file  Stress: Not on file  Social Connections: Not on file   Past Surgical History:  Procedure Laterality Date   ABDOMINAL AORTOGRAM W/LOWER EXTREMITY N/A 04/02/2018   Procedure: ABDOMINAL AORTOGRAM W/LOWER EXTREMITY;  Surgeon: Wellington Hampshire, MD;  Location: [facility];  Service: Cardiovascular;  Laterality: N/A;   ABDOMINAL AORTOGRAM W/LOWER EXTREMITY N/A 09/23/2019   Procedure: ABDOMINAL AORTOGRAM  W/LOWER EXTREMITY;  Surgeon: Wellington Hampshire, MD;  Location: [facility];  Service: Cardiovascular;  Laterality: N/A;   ABDOMINAL HYSTERECTOMY  1999   CERVICAL SPINE SURGERY  [id number]   C5/6 6/7   COLONOSCOPY     CRANIOTOMY Right 08/23/2020   Procedure: RIGHT CRANIOTOMY HEMATOMA EVACUATION SUBDURAL;  Surgeon: Consuella Lose, MD;  Location: [facility];  Service: Neurosurgery;  Laterality: Right;   ECTOPIC PREGNANCY SURGERY     Left axillary cyst removal 1989     MULTIPLE EXTRACTIONS WITH ALVEOLOPLASTY N/A 10/25/2017   Procedure: EXTRACTION NUMBERS FIVE, SEVEN, EIGHT, NINE, TEN, FIFTEEN, EIGHTEEN WITH ALVEOLOPLASTY, IRRIGATION AND DEBRIDEMENT LEFT SUBMANDIBULAR INFECTION;   Surgeon: Diona Browner, DDS;  Location: [facility];  Service: Oral Surgery;  Laterality: N/A;   PERIPHERAL VASCULAR ATHERECTOMY Right 09/23/2019   Procedure: PERIPHERAL VASCULAR ATHERECTOMY;  Surgeon: Wellington Hampshire, MD;  Location: [facility];  Service: Cardiovascular;  Laterality: Right;  SFA   PERIPHERAL VASCULAR BALLOON ANGIOPLASTY Right 09/23/2019   Procedure: PERIPHERAL VASCULAR BALLOON ANGIOPLASTY;  Surgeon: Wellington Hampshire, MD;  Location: [facility];  Service: Cardiovascular;  Laterality: Right;  SFA   RADIOLOGY WITH ANESTHESIA N/A 11/18/2018   Procedure: MRI OF THE CERVICAL SPINE WITHOUT CONTRAST;  Surgeon: Radiologist, Medication, MD;  Location: [facility];  Service: Radiology;  Laterality: N/A;   RADIOLOGY WITH ANESTHESIA N/A 12/03/2019   Procedure: MRI WITH ANESTHESIA CERVICAL WITH AND WITHOUT CONTRAST;  Surgeon: Radiologist, Medication, MD;  Location: [facility];  Service: Radiology;  Laterality: N/A;   RADIOLOGY WITH ANESTHESIA N/A 03/02/2021   Procedure: MRI CERVICAL SPINE WITH AND WITHOUT CONTRAST;  Surgeon: Radiologist, Medication, MD;  Location: [facility];  Service: Radiology;  Laterality: N/A;   TONSILECTOMY, ADENOIDECTOMY, BILATERAL MYRINGOTOMY AND TUBES  1981   put not aware of tubes being put in [place]   Past Surgical History:  Procedure Laterality Date   ABDOMINAL AORTOGRAM W/LOWER EXTREMITY N/A 04/02/2018   Procedure: ABDOMINAL AORTOGRAM W/LOWER EXTREMITY;  Surgeon: Wellington Hampshire, MD;  Location: [facility];  Service: Cardiovascular;  Laterality: N/A;   ABDOMINAL AORTOGRAM W/LOWER EXTREMITY N/A 09/23/2019   Procedure: ABDOMINAL AORTOGRAM W/LOWER EXTREMITY;  Surgeon: Wellington Hampshire, MD;  Location: [facility];  Service: Cardiovascular;  Laterality: N/A;   ABDOMINAL HYSTERECTOMY  1999   CERVICAL SPINE SURGERY  [id number]   C5/6 6/7   COLONOSCOPY     CRANIOTOMY Right 08/23/2020   Procedure: RIGHT CRANIOTOMY HEMATOMA EVACUATION  SUBDURAL;  Surgeon: Consuella Lose, MD;  Location: [facility];  Service: Neurosurgery;  Laterality: Right;   ECTOPIC PREGNANCY SURGERY     Left axillary cyst removal 1989     MULTIPLE EXTRACTIONS WITH ALVEOLOPLASTY N/A 10/25/2017   Procedure: EXTRACTION NUMBERS FIVE, SEVEN, EIGHT, NINE, TEN, FIFTEEN, EIGHTEEN WITH ALVEOLOPLASTY, IRRIGATION AND DEBRIDEMENT LEFT SUBMANDIBULAR INFECTION;  Surgeon: Diona Browner, DDS;  Location: [facility];  Service: Oral Surgery;  Laterality: N/A;   PERIPHERAL VASCULAR ATHERECTOMY Right 09/23/2019   Procedure: PERIPHERAL VASCULAR ATHERECTOMY;  Surgeon: Wellington Hampshire, MD;  Location: [facility];  Service: Cardiovascular;  Laterality: Right;  SFA   PERIPHERAL VASCULAR BALLOON ANGIOPLASTY Right 09/23/2019   Procedure: PERIPHERAL VASCULAR BALLOON ANGIOPLASTY;  Surgeon: Wellington Hampshire, MD;  Location: [facility];  Service: Cardiovascular;  Laterality: Right;  SFA   RADIOLOGY WITH ANESTHESIA N/A 11/18/2018   Procedure: MRI OF THE CERVICAL SPINE WITHOUT CONTRAST;  Surgeon: Radiologist, Medication, MD;  Location: [facility];  Service: Radiology;  Laterality: N/A;   RADIOLOGY WITH ANESTHESIA N/A 12/03/2019   Procedure: MRI WITH ANESTHESIA CERVICAL WITH AND WITHOUT CONTRAST;  Surgeon: Radiologist, Medication, MD;  Location: [facility];  Service: Radiology;  Laterality: N/A;   RADIOLOGY WITH ANESTHESIA N/A 03/02/2021   Procedure: MRI CERVICAL SPINE WITH AND WITHOUT CONTRAST;  Surgeon: Radiologist, Medication, MD;  Location: [facility];  Service: Radiology;  Laterality: N/A;   TONSILECTOMY, ADENOIDECTOMY, BILATERAL MYRINGOTOMY AND TUBES  1981   put not aware of tubes being put in [place]   Past Medical History:  Diagnosis Date   Anemia    in the past   Anxiety    yrs. 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 reported her father became deaf after having anesthesia    Fibromyalgia    GERD (gastroesophageal reflux disease)    Heart murmur    due to rheumatic fever, PCP no longer hears it   Hyperlipidemia    Hypertension    Myalgia    Neuropathy    PAD (peripheral artery disease) ([facility])    PAD (peripheral artery disease) ([place])    Pre-diabetes    Pyelonephritis 09/10/2019    "septic"   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, has to have chest xray   There were no vitals taken for this visit.  Opioid Risk Score:   Fall Risk Score:  `1  Depression screen PHQ 2/9     06/18/2022    1:38 PM 05/25/2021    8:31 AM 02/22/2021    8:26 AM 01/02/2021    1:35 PM 04/14/2020   10:18 AM 11/20/2018   10:53 AM 12/12/2016   10:04 AM  Depression screen PHQ 2/9  Decreased Interest 0 0 0 0 0 0 0  Down, Depressed, Hopeless 0 0 0 0 0 0 0  PHQ - 2 Score 0 0 0 0 0 0 0    Review of Systems  Musculoskeletal:  Positive for gait problem and neck pain.       Pain in the right shoulder & right arm, left torso, left arm & left shoulder  All other systems reviewed and are negative.      Objective:   Physical Exam Vitals and nursing note reviewed.  Constitutional:      Appearance: Normal appearance.  Cardiovascular:     Rate and Rhythm: Normal rate and regular rhythm.     Pulses: Normal pulses.     Heart sounds: Normal heart sounds.  Pulmonary:     Effort: Pulmonary effort is normal.     Breath sounds: Normal breath sounds.  Musculoskeletal:     Cervical back: Normal range of motion and neck supple.     Comments: Normal Muscle Bulk and Muscle Testing Reveals:  Upper Extremities: Right: Decreased ROM 90 Degrees and Muscle Strength 5/5 Left Upper Extremities: Full ROM and Muscle Strength 5/5 Right AC Joint Tenderness Thoracic Hypersensitivity: T-1-T-7  Lower Extremities: Full ROM and Muscle Strength 5/5 Arises from Table slowly Narrow Based  Gait     Skin:    General: Skin is warm and dry.  Neurological:     Mental Status: She is alert and oriented to  person, place, and time.  Psychiatric:        Mood and Affect: Mood normal.  Behavior: Behavior normal.         Assessment & Plan:  Cervicalgia/ Cervical Radiculitis: She is scheduled for Cervical MRI WO Contrast: Dr Kathyrn Sheriff Following.  Continue HEP as Tolerated. Continue to Monitor. Neurosurgery Following. 08/27/2022 2. Lumbar Radiculitis: Continue  Topamax 25 mg one tablet twice a day . Continue HEP as tolerated. Continue to monitor. Neurosurgery Following. 08/27/2022 3.Muscle Spasm: Continue current medication regimen with Cyclobenzaprine  Continue to monitor. 08/27/2022 4. Numbness and tingling in bilateral upper extremities :No complaints today. Continue current medication regimen. Continue to monitor. 08/27/2022 5.Chronic Pain Syndrome: Continue current medication regimen. Continue to monitor. 08/27/2022   F/U in 2 months

## 2022-08-27 NOTE — Progress Notes (Signed)
Subjective:    Patient ID: Amy Bruce, female    DOB: [birth year], 71 y.o.   MRN: [id number]  HPI: Amy Bruce is a 71 y.o. female who returns for follow up appointment for chronic pain and medication refill. She states her pain is located in her neck radiating into her right shoulder, right arm and tingling and burning into her right hand. She rates her pain 10. Her current exercise regime is walking with cane.  Ms. Amy Bruce seen Dr. Kathyrn Sheriff on 07/02/2022, she presented to Dr Kathyrn Sheriff with complaints of right sided neck and arm pain. Dr Kathyrn Sheriff recommended MRI of Cervical Spine without contrast, Ms. Amy Bruce stated she was unable to have MRI without anesthesia, due to her having a panic attack, when she tried in the past. Dr. Kathyrn Sheriff doesn't order MRI with sedation. In the past Dr Posey Pronto ordered Cervical MRI WO Contrast with sedation ,on  03/02/2021. Dr Posey Pronto is no longer a provider at our office, I spoke with Dr Octavio Manns regarding the above, she agrees with ordering the Cervical MRI WO Contrast with sedation. Order was placed.   Dr Kathyrn Sheriff will review the MRI, and will discuss any further treatment options from a neurosurgical standpoint.      Pain Inventory Average Pain 10 Pain Right Now 10 My pain is constant, sharp, stabbing, tingling, and aching  In the last 24 hours, has pain interfered with the following? General activity 9 Relation with others 10 Enjoyment of life 9 What TIME of day is your pain at its worst? morning , daytime, evening, and night Sleep (in general) Fair  Pain is worse with: walking, bending, sitting, inactivity, standing, and some activites Pain improves with: medication Relief from Meds: 8  Family History  Problem Relation Age of Onset   Ovarian cancer Mother    Social History   Socioeconomic History   Marital status: Divorced    Spouse name: Not on file   Number of children: Not on file   Years of education: Not on file   Highest education  level: Not on file  Occupational History   Not on file  Tobacco Use   Smoking status: Former    Packs/day: 1.00    Years: 33.00    Total pack years: 33.00    Types: Cigarettes    Quit date: 08/2020    Years since quitting: 2.0   Smokeless tobacco: Never   Tobacco comments:    2 ciggs a day  Vaping Use   Vaping Use: Never used  Substance and Sexual Activity   Alcohol use: Not Currently    Alcohol/week: 2.0 standard drinks of alcohol    Types: 2 Shots of liquor per week    Comment: "not since 2021" - 03/01/21   Drug use: No   Sexual activity: Not on file  Other Topics Concern   Not on file  Social History Narrative   Not on file   Social Determinants of Health   Financial Resource Strain: Not on file  Food Insecurity: Not on file  Transportation Needs: Not on file  Physical Activity: Not on file  Stress: Not on file  Social Connections: Not on file   Past Surgical History:  Procedure Laterality Date   ABDOMINAL AORTOGRAM W/LOWER EXTREMITY N/A 04/02/2018   Procedure: ABDOMINAL AORTOGRAM W/LOWER EXTREMITY;  Surgeon: Wellington Hampshire, MD;  Location: [facility];  Service: Cardiovascular;  Laterality: N/A;   ABDOMINAL AORTOGRAM W/LOWER EXTREMITY N/A 09/23/2019   Procedure: ABDOMINAL AORTOGRAM  W/LOWER EXTREMITY;  Surgeon: Wellington Hampshire, MD;  Location: [facility];  Service: Cardiovascular;  Laterality: N/A;   ABDOMINAL HYSTERECTOMY  1999   CERVICAL SPINE SURGERY  [id number]   C5/6 6/7   COLONOSCOPY     CRANIOTOMY Right 08/23/2020   Procedure: RIGHT CRANIOTOMY HEMATOMA EVACUATION SUBDURAL;  Surgeon: Consuella Lose, MD;  Location: [facility];  Service: Neurosurgery;  Laterality: Right;   ECTOPIC PREGNANCY SURGERY     Left axillary cyst removal 1989     MULTIPLE EXTRACTIONS WITH ALVEOLOPLASTY N/A 10/25/2017   Procedure: EXTRACTION NUMBERS FIVE, SEVEN, EIGHT, NINE, TEN, FIFTEEN, EIGHTEEN WITH ALVEOLOPLASTY, IRRIGATION AND DEBRIDEMENT LEFT SUBMANDIBULAR INFECTION;   Surgeon: Diona Browner, DDS;  Location: [facility];  Service: Oral Surgery;  Laterality: N/A;   PERIPHERAL VASCULAR ATHERECTOMY Right 09/23/2019   Procedure: PERIPHERAL VASCULAR ATHERECTOMY;  Surgeon: Wellington Hampshire, MD;  Location: [facility];  Service: Cardiovascular;  Laterality: Right;  SFA   PERIPHERAL VASCULAR BALLOON ANGIOPLASTY Right 09/23/2019   Procedure: PERIPHERAL VASCULAR BALLOON ANGIOPLASTY;  Surgeon: Wellington Hampshire, MD;  Location: [facility];  Service: Cardiovascular;  Laterality: Right;  SFA   RADIOLOGY WITH ANESTHESIA N/A 11/18/2018   Procedure: MRI OF THE CERVICAL SPINE WITHOUT CONTRAST;  Surgeon: Radiologist, Medication, MD;  Location: [facility];  Service: Radiology;  Laterality: N/A;   RADIOLOGY WITH ANESTHESIA N/A 12/03/2019   Procedure: MRI WITH ANESTHESIA CERVICAL WITH AND WITHOUT CONTRAST;  Surgeon: Radiologist, Medication, MD;  Location: [place] [facility];  Service: Radiology;  Laterality: N/A;   RADIOLOGY WITH ANESTHESIA N/A 03/02/2021   Procedure: MRI CERVICAL SPINE WITH AND WITHOUT CONTRAST;  Surgeon: Radiologist, Medication, MD;  Location: [place];  Service: Radiology;  Laterality: N/A;   TONSILECTOMY, ADENOIDECTOMY, BILATERAL MYRINGOTOMY AND TUBES  1981   put not aware of tubes being put in [place]   Past Surgical History:  Procedure Laterality Date   ABDOMINAL AORTOGRAM W/LOWER EXTREMITY N/A 04/02/2018   Procedure: ABDOMINAL AORTOGRAM W/LOWER EXTREMITY;  Surgeon: Wellington Hampshire, MD;  Location: [facility];  Service: Cardiovascular;  Laterality: N/A;   ABDOMINAL AORTOGRAM W/LOWER EXTREMITY N/A 09/23/2019   Procedure: ABDOMINAL AORTOGRAM W/LOWER EXTREMITY;  Surgeon: Wellington Hampshire, MD;  Location: [facility];  Service: Cardiovascular;  Laterality: N/A;   ABDOMINAL HYSTERECTOMY  1999   CERVICAL SPINE SURGERY  [id number]   C5/6 6/7   COLONOSCOPY     CRANIOTOMY Right 08/23/2020   Procedure: RIGHT CRANIOTOMY HEMATOMA EVACUATION  SUBDURAL;  Surgeon: Consuella Lose, MD;  Location: [place];  Service: Neurosurgery;  Laterality: Right;   ECTOPIC PREGNANCY SURGERY     Left axillary cyst removal 1989     MULTIPLE EXTRACTIONS WITH ALVEOLOPLASTY N/A 10/25/2017   Procedure: EXTRACTION NUMBERS FIVE, SEVEN, EIGHT, NINE, TEN, FIFTEEN, EIGHTEEN WITH ALVEOLOPLASTY, IRRIGATION AND DEBRIDEMENT LEFT SUBMANDIBULAR INFECTION;  Surgeon: Diona Browner, DDS;  Location: [facility] [place];  Service: Oral Surgery;  Laterality: N/A;   PERIPHERAL VASCULAR ATHERECTOMY Right 09/23/2019   Procedure: PERIPHERAL VASCULAR ATHERECTOMY;  Surgeon: Wellington Hampshire, MD;  Location: [facility];  Service: Cardiovascular;  Laterality: Right;  SFA   PERIPHERAL VASCULAR BALLOON ANGIOPLASTY Right 09/23/2019   Procedure: PERIPHERAL VASCULAR BALLOON ANGIOPLASTY;  Surgeon: Wellington Hampshire, MD;  Location: [facility];  Service: Cardiovascular;  Laterality: Right;  SFA   RADIOLOGY WITH ANESTHESIA N/A 11/18/2018   Procedure: MRI OF THE CERVICAL SPINE WITHOUT CONTRAST;  Surgeon: Radiologist, Medication, MD;  Location: [facility];  Service: Radiology;  Laterality: N/A;   RADIOLOGY WITH ANESTHESIA N/A 12/03/2019   Procedure: MRI WITH ANESTHESIA CERVICAL WITH AND WITHOUT CONTRAST;  Surgeon: Radiologist, Medication, MD;  Location: [facility];  Service: Radiology;  Laterality: N/A;   RADIOLOGY WITH ANESTHESIA N/A 03/02/2021   Procedure: MRI CERVICAL SPINE WITH AND WITHOUT CONTRAST;  Surgeon: Radiologist, Medication, MD;  Location: [place];  Service: Radiology;  Laterality: N/A;   TONSILECTOMY, ADENOIDECTOMY, BILATERAL MYRINGOTOMY AND TUBES  1981   put not aware of tubes being put in [place]   Past Medical History:  Diagnosis Date   Anemia    in the past   Anxiety    yrs. 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 reported her father became deaf after having anesthesia    Fibromyalgia    GERD (gastroesophageal reflux disease)    Heart murmur    due to rheumatic fever, PCP no longer hears it   Hyperlipidemia    Hypertension    Myalgia    Neuropathy    PAD (peripheral artery disease) ([facility])    PAD (peripheral artery disease) ([place])    Pre-diabetes    Pyelonephritis 09/10/2019    "septic"   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, has to have chest xray   There were no vitals taken for this visit.  Opioid Risk Score:   Fall Risk Score:  `1  Depression screen PHQ 2/9     06/18/2022    1:38 PM 05/25/2021    8:31 AM 02/22/2021    8:26 AM 01/02/2021    1:35 PM 04/14/2020   10:18 AM 11/20/2018   10:53 AM 12/12/2016   10:04 AM  Depression screen PHQ 2/9  Decreased Interest 0 0 0 0 0 0 0  Down, Depressed, Hopeless 0 0 0 0 0 0 0  PHQ - 2 Score 0 0 0 0 0 0 0    Review of Systems  Musculoskeletal:  Positive for gait problem and neck pain.       Pain in the right shoulder & right arm, left torso, left arm & left shoulder  All other systems reviewed and are negative.      Objective:   Physical Exam Vitals and nursing note reviewed.  Constitutional:      Appearance: Normal appearance.  Cardiovascular:     Rate and Rhythm: Normal rate and regular rhythm.     Pulses: Normal pulses.     Heart sounds: Normal heart sounds.  Pulmonary:     Effort: Pulmonary effort is normal.     Breath sounds: Normal breath sounds.  Musculoskeletal:     Cervical back: Normal range of motion and neck supple.     Comments: Normal Muscle Bulk and Muscle Testing Reveals:  Upper Extremities: Right: Decreased ROM 90 Degrees and Muscle Strength 5/5 Left Upper Extremities: Full ROM and Muscle Strength 5/5 Right AC Joint Tenderness Thoracic Hypersensitivity: T-1-T-7  Lower Extremities: Full ROM and Muscle Strength 5/5 Arises from Table slowly Narrow Based  Gait     Skin:    General: Skin is warm and dry.  Neurological:     Mental Status: She is alert and oriented to  person, place, and time.  Psychiatric:        Mood and Affect: Mood normal.  Behavior: Behavior normal.         Assessment & Plan:  Cervicalgia/ Cervical Radiculitis: She is scheduled for Cervical MRI WO Contrast: Dr Kathyrn Sheriff Following.  Continue HEP as Tolerated. Continue to Monitor. Neurosurgery Following. 08/27/2022 2. Lumbar Radiculitis: Continue  Topamax 25 mg one tablet twice a day . Continue HEP as tolerated. Continue to monitor. Neurosurgery Following. 08/27/2022 3.Muscle Spasm: Continue current medication regimen with Cyclobenzaprine  Continue to monitor. 08/27/2022 4. Numbness and tingling in bilateral upper extremities :No complaints today. Continue current medication regimen. Continue to monitor. 08/27/2022 5.Chronic Pain Syndrome: Continue current medication regimen. Continue to monitor. 08/27/2022   F/U in 2 months

## 2022-09-24 ENCOUNTER — Encounter (HOSPITAL_COMMUNITY): Payer: Self-pay | Admitting: *Deleted

## 2022-09-24 ENCOUNTER — Other Ambulatory Visit: Payer: Self-pay

## 2022-09-24 NOTE — Progress Notes (Addendum)
Ms Empson denies chest pain or shortness.  Patient denies having any s/s of Covid in her household, also denies any known exposure to Covid.   Ms Salas has SVT at times, last a few seconds, no other issues during this time. Patient's cardiologist  is Dr. Dr. Rod Can.  Ms Ishii PCP is Dr. Orland Mustard.  Ms Murdy is pre- diabetic, patient reports that last A1C was 6 something.  Patient checks CBG here and there, she does not remember what they have been.  I instructed patient to check CBG after awaking and every 2 hours until arrival  to the hospital.  I Instructed Ms Strassner if CBG is less than 70 to take 4 Glucose Tablets or 1 tube of Glucose Gel or 1/2 cup of a clear juice. Recheck CBG in 15 minutes if CBG is not over 70 call, pre- op desk at [phone] for further instructions.

## 2022-09-25 ENCOUNTER — Encounter (HOSPITAL_COMMUNITY): Admission: RE | Disposition: A | Discharge status: Home / Self Care | Payer: Self-pay | Source: Home / Self Care

## 2022-09-25 ENCOUNTER — Ambulatory Visit (HOSPITAL_COMMUNITY): Payer: 59 | Admitting: Certified Registered Nurse Anesthetist

## 2022-09-25 ENCOUNTER — Encounter (HOSPITAL_COMMUNITY): Payer: Self-pay

## 2022-09-25 ENCOUNTER — Ambulatory Visit (HOSPITAL_COMMUNITY)
Admission: RE | Admit: 2022-09-25 | Discharge: 2022-09-25 | Disposition: A | Discharge status: Home / Self Care | Payer: 59 | Source: Ambulatory Visit | Attending: Registered Nurse | Admitting: Registered Nurse

## 2022-09-25 ENCOUNTER — Ambulatory Visit (HOSPITAL_BASED_OUTPATIENT_CLINIC_OR_DEPARTMENT_OTHER): Payer: 59 | Admitting: Certified Registered Nurse Anesthetist

## 2022-09-25 ENCOUNTER — Ambulatory Visit (HOSPITAL_COMMUNITY)
Admission: RE | Admit: 2022-09-25 | Discharge: 2022-09-25 | Disposition: A | Discharge status: Home / Self Care | Payer: 59 | Attending: Registered Nurse | Admitting: Registered Nurse

## 2022-09-25 DIAGNOSIS — I10 Essential (primary) hypertension: Secondary | ICD-10-CM | POA: Diagnosis not present

## 2022-09-25 DIAGNOSIS — M4803 Spinal stenosis, cervicothoracic region: Secondary | ICD-10-CM | POA: Insufficient documentation

## 2022-09-25 DIAGNOSIS — Z79899 Other long term (current) drug therapy: Secondary | ICD-10-CM | POA: Insufficient documentation

## 2022-09-25 DIAGNOSIS — M5416 Radiculopathy, lumbar region: Secondary | ICD-10-CM | POA: Insufficient documentation

## 2022-09-25 DIAGNOSIS — M1990 Unspecified osteoarthritis, unspecified site: Secondary | ICD-10-CM | POA: Diagnosis not present

## 2022-09-25 DIAGNOSIS — M797 Fibromyalgia: Secondary | ICD-10-CM | POA: Diagnosis not present

## 2022-09-25 DIAGNOSIS — M5412 Radiculopathy, cervical region: Secondary | ICD-10-CM | POA: Insufficient documentation

## 2022-09-25 DIAGNOSIS — M542 Cervicalgia: Secondary | ICD-10-CM | POA: Insufficient documentation

## 2022-09-25 DIAGNOSIS — Z87891 Personal history of nicotine dependence: Secondary | ICD-10-CM

## 2022-09-25 DIAGNOSIS — G894 Chronic pain syndrome: Secondary | ICD-10-CM | POA: Insufficient documentation

## 2022-09-25 DIAGNOSIS — I739 Peripheral vascular disease, unspecified: Secondary | ICD-10-CM | POA: Diagnosis not present

## 2022-09-25 DIAGNOSIS — M4802 Spinal stenosis, cervical region: Secondary | ICD-10-CM | POA: Diagnosis not present

## 2022-09-25 DIAGNOSIS — G9589 Other specified diseases of spinal cord: Secondary | ICD-10-CM | POA: Diagnosis not present

## 2022-09-25 DIAGNOSIS — K449 Diaphragmatic hernia without obstruction or gangrene: Secondary | ICD-10-CM | POA: Insufficient documentation

## 2022-09-25 HISTORY — DX: Family history of other specified conditions: Z84.89

## 2022-09-25 HISTORY — PX: RADIOLOGY WITH ANESTHESIA: SHX6223

## 2022-09-25 HISTORY — DX: Supraventricular tachycardia, unspecified: I47.10

## 2022-09-25 HISTORY — DX: Personal history of other diseases of the digestive system: Z87.19

## 2022-09-25 HISTORY — DX: Personal history of other medical treatment: Z92.89

## 2022-09-25 HISTORY — DX: Dizziness and giddiness: R42

## 2022-09-25 HISTORY — DX: Gastrointestinal hemorrhage, unspecified: K92.2

## 2022-09-25 LAB — CBC
HCT: 40.6 % (ref 36.0–46.0)
Hemoglobin: 12.8 g/dL (ref 12.0–15.0)
MCH: 27.5 pg (ref 26.0–34.0)
MCHC: 31.5 g/dL (ref 30.0–36.0)
MCV: 87.3 fL (ref 80.0–100.0)
Platelets: 213 10*3/uL (ref 150–400)
RBC: 4.65 MIL/uL (ref 3.87–5.11)
RDW: 14.4 % (ref 11.5–15.5)
WBC: 4.5 10*3/uL (ref 4.0–10.5)
nRBC: 0 % (ref 0.0–0.2)

## 2022-09-25 LAB — BASIC METABOLIC PANEL
Anion gap: 8 (ref 5–15)
BUN: 9 mg/dL (ref 8–23)
CO2: 23 mmol/L (ref 22–32)
Calcium: 9.4 mg/dL (ref 8.9–10.3)
Chloride: 108 mmol/L (ref 98–111)
Creatinine, Ser: 0.93 mg/dL (ref 0.44–1.00)
GFR, Estimated: >60 mL/min (ref >60)
Glucose, Bld: 103 mg/dL — ABNORMAL HIGH (ref 70–99)
Potassium: 4.1 mmol/L (ref 3.5–5.1)
Sodium: 139 mmol/L (ref 135–145)

## 2022-09-25 SURGERY — MRI WITH ANESTHESIA
Anesthesia: Monitor Anesthesia Care

## 2022-09-25 MED ORDER — LACTATED RINGERS IV SOLN: INTRAVENOUS | Status: DC

## 2022-09-25 MED ORDER — ONDANSETRON HCL 4 MG/2ML IJ SOLN
INTRAMUSCULAR | Status: DC | PRN
  Administered 2022-09-25: 4 mg via INTRAVENOUS

## 2022-09-25 MED ORDER — CHLORHEXIDINE GLUCONATE 0.12 % MT SOLN
15.0000 mL | Freq: Once | OROMUCOSAL | Status: AC
  Administered 2022-09-25: 15 mL via OROMUCOSAL
  Filled 2022-09-25: qty 15

## 2022-09-25 MED ORDER — PROPOFOL 10 MG/ML IV BOLUS
INTRAVENOUS | Status: DC | PRN
  Administered 2022-09-25: 20 mg via INTRAVENOUS
  Administered 2022-09-25: 10 mg via INTRAVENOUS

## 2022-09-25 MED ORDER — FENTANYL CITRATE (PF) 250 MCG/5ML IJ SOLN
INTRAMUSCULAR | Status: DC | PRN
  Administered 2022-09-25 (×2): 25 ug via INTRAVENOUS

## 2022-09-25 MED ORDER — ORAL CARE MOUTH RINSE: 15.0000 mL | Freq: Once | OROMUCOSAL | Status: AC

## 2022-09-25 MED ORDER — ONDANSETRON HCL 4 MG/2ML IJ SOLN: 4.0000 mg | Freq: Once | INTRAMUSCULAR | Status: DC | PRN

## 2022-09-25 MED ORDER — AMISULPRIDE (ANTIEMETIC) 5 MG/2ML IV SOLN: 10.0000 mg | Freq: Once | INTRAVENOUS | Status: DC | PRN

## 2022-09-25 MED ORDER — MIDAZOLAM HCL 2 MG/2ML IJ SOLN
INTRAMUSCULAR | Status: DC | PRN
  Administered 2022-09-25 (×2): 1 mg via INTRAVENOUS

## 2022-09-25 NOTE — Anesthesia Postprocedure Evaluation (Signed)
Anesthesia Post Note  Patient: Amy Bruce  Procedure(s) Performed: MRI WITH ANESTHESIA CERVICAL SPINE WITHOUT CONTRAST     Patient location during evaluation: PACU Anesthesia Type: MAC Level of consciousness: awake and alert Pain management: pain level controlled Vital Signs Assessment: post-procedure vital signs reviewed and stable Respiratory status: spontaneous breathing, nonlabored ventilation, respiratory function stable and patient connected to nasal cannula oxygen Cardiovascular status: stable and blood pressure returned to baseline Postop Assessment: no apparent nausea or vomiting Anesthetic complications: no  No notable events documented.  Last Vitals:  Vitals:   09/25/22 1045 09/25/22 1100  BP: (!) 159/70 (!) 141/88  Pulse: 81 75  Resp: 13 19  Temp: 36.7 C 36.7 C  SpO2: 100% 100%    Last Pain:  Vitals:   09/25/22 1100  TempSrc:   PainSc: 0-No pain                 Effie Berkshire

## 2022-09-25 NOTE — Transfer of Care (Signed)
Immediate Anesthesia Transfer of Care Note  Patient: Amy Bruce  Procedure(s) Performed: MRI WITH ANESTHESIA CERVICAL SPINE WITHOUT CONTRAST  Patient Location: PACU  Anesthesia Type:MAC  Level of Consciousness: awake, alert , oriented, patient cooperative, and responds to stimulation  Airway & Oxygen Therapy: Patient Spontanous Breathing  Post-op Assessment: Report given to RN, Post -op Vital signs reviewed and stable, and Patient moving all extremities X 4  Post vital signs: Reviewed and stable  Last Vitals:  Vitals Value Taken Time  BP 159/70 09/25/22 1045  Temp 36.7 C 09/25/22 1045  Pulse 77 09/25/22 1055  Resp 19 09/25/22 1055  SpO2 100 % 09/25/22 1055  Vitals shown include unvalidated device data.  Last Pain:  Vitals:   09/25/22 1045  TempSrc:   PainSc: 0-No pain      Patients Stated Pain Goal: 2 (22/24/11 4643)  Complications: No notable events documented.

## 2022-09-25 NOTE — Interval H&P Note (Signed)
Anesthesia H&P Update: History and Physical Exam reviewed; patient is OK for planned anesthetic and procedure. ? ?

## 2022-09-25 NOTE — Anesthesia Preprocedure Evaluation (Signed)
Anesthesia Evaluation  Patient identified by MRN, date of birth, ID band Patient awake    Reviewed: Allergy & Precautions, NPO status , Patient's Chart, lab work & pertinent test results  Airway Mallampati: II  TM Distance: >3 FB Neck ROM: Full    Dental  (+) Partial Upper, Dental Advisory Given   Pulmonary former smoker   breath sounds clear to auscultation       Cardiovascular hypertension, + Peripheral Vascular Disease   Rhythm:Regular Rate:Normal     Neuro/Psych  Headaches  Anxiety        GI/Hepatic Neg liver ROS, hiatal hernia,GERD  ,,  Endo/Other  negative endocrine ROS    Renal/GU negative Renal ROS     Musculoskeletal  (+) Arthritis ,  Fibromyalgia -  Abdominal   Peds  Hematology negative hematology ROS (+)   Anesthesia Other Findings   Reproductive/Obstetrics                             Anesthesia Physical Anesthesia Plan  ASA: 3  Anesthesia Plan: MAC   Post-op Pain Management: Minimal or no pain anticipated   Induction: Intravenous  PONV Risk Score and Plan: 0 and Propofol infusion  Airway Management Planned: Natural Airway and Nasal Cannula  Additional Equipment: None  Intra-op Plan:   Post-operative Plan:   Informed Consent: I have reviewed the patients History and Physical, chart, labs and discussed the procedure including the risks, benefits and alternatives for the proposed anesthesia with the patient or authorized representative who has indicated his/her understanding and acceptance.       Plan Discussed with: CRNA  Anesthesia Plan Comments:        Anesthesia Quick Evaluation

## 2022-09-26 ENCOUNTER — Encounter (HOSPITAL_COMMUNITY): Payer: Self-pay | Admitting: Radiology

## 2022-09-28 NOTE — Telephone Encounter (Signed)
Call Ms. Sutphen,  She will call her neurosurgery , to discuss MRI Results.

## 2022-10-03 DIAGNOSIS — S065X9A Traumatic subdural hemorrhage with loss of consciousness of unspecified duration, initial encounter: Secondary | ICD-10-CM | POA: Diagnosis not present

## 2022-10-03 DIAGNOSIS — G959 Disease of spinal cord, unspecified: Secondary | ICD-10-CM | POA: Diagnosis not present

## 2022-10-29 ENCOUNTER — Encounter: Payer: 59 | Attending: Registered Nurse | Admitting: Registered Nurse

## 2022-10-29 DIAGNOSIS — R200 Anesthesia of skin: Secondary | ICD-10-CM | POA: Insufficient documentation

## 2022-10-29 DIAGNOSIS — R202 Paresthesia of skin: Secondary | ICD-10-CM | POA: Insufficient documentation

## 2022-10-29 DIAGNOSIS — M5412 Radiculopathy, cervical region: Secondary | ICD-10-CM | POA: Insufficient documentation

## 2022-10-29 DIAGNOSIS — M62838 Other muscle spasm: Secondary | ICD-10-CM | POA: Insufficient documentation

## 2022-10-29 DIAGNOSIS — M546 Pain in thoracic spine: Secondary | ICD-10-CM | POA: Insufficient documentation

## 2022-10-29 DIAGNOSIS — G894 Chronic pain syndrome: Secondary | ICD-10-CM | POA: Insufficient documentation

## 2022-10-29 DIAGNOSIS — G8929 Other chronic pain: Secondary | ICD-10-CM | POA: Insufficient documentation

## 2022-10-29 DIAGNOSIS — M542 Cervicalgia: Secondary | ICD-10-CM | POA: Insufficient documentation

## 2022-10-29 DIAGNOSIS — G441 Vascular headache, not elsewhere classified: Secondary | ICD-10-CM | POA: Insufficient documentation

## 2022-11-20 ENCOUNTER — Encounter: Payer: Self-pay | Admitting: Cardiovascular Disease

## 2022-11-20 ENCOUNTER — Ambulatory Visit: Payer: 59 | Attending: Cardiovascular Disease | Admitting: Cardiovascular Disease

## 2022-11-20 ENCOUNTER — Ambulatory Visit: Payer: 59 | Attending: Cardiovascular Disease

## 2022-11-20 VITALS — BP 136/64 | HR 89 | Ht 62.0 in | Wt 169.0 lb

## 2022-11-20 DIAGNOSIS — R079 Chest pain, unspecified: Secondary | ICD-10-CM

## 2022-11-20 DIAGNOSIS — I739 Peripheral vascular disease, unspecified: Secondary | ICD-10-CM | POA: Diagnosis not present

## 2022-11-20 DIAGNOSIS — I872 Venous insufficiency (chronic) (peripheral): Secondary | ICD-10-CM

## 2022-11-20 DIAGNOSIS — I10 Essential (primary) hypertension: Secondary | ICD-10-CM | POA: Diagnosis not present

## 2022-11-20 DIAGNOSIS — E785 Hyperlipidemia, unspecified: Secondary | ICD-10-CM

## 2022-11-20 DIAGNOSIS — R000 Tachycardia, unspecified: Secondary | ICD-10-CM | POA: Diagnosis not present

## 2022-11-20 NOTE — Progress Notes (Unsigned)
Enrolled patient for a 14 day Zio XT  monitor to be mailed to patients home  °

## 2022-11-20 NOTE — Patient Instructions (Signed)
Medication Instructions:  No changes *If you need a refill on your cardiac medications before your next appointment, please call your pharmacy*   Lab Work: None ordered If you have labs (blood work) drawn today and your tests are completely normal, you will receive your results only by: [place] (if you have MyChart) OR A paper copy in the mail If you have any lab test that is abnormal or we need to change your treatment, we will call you to review the results.   Testing/Procedures: Your physician has requested that you have a lower extremity arterial duplex. During this test, ultrasound is used to evaluate arterial blood flow in the legs. Allow one hour for this exam. There are no restrictions or special instructions. This will take place at [address], [address].  Your physician has requested that you have an ankle brachial index (ABI). During this test an ultrasound and blood pressure cuff are used to evaluate the arteries that supply the arms and legs with blood. Allow thirty minutes for this exam. There are no restrictions or special instructions. This will take place at [place], [address].   Your physician has requested that you have a lexiscan myoview. For further information please visit [email or web address]. Please follow instruction sheet, as given. This will take place at [address]  How to prepare for your Myocardial Perfusion Test: Do not eat or drink 3 hours prior to your test, except you may have water. Do not consume products containing caffeine (regular or decaffeinated) 12 hours prior to your test. (ex: coffee, chocolate, sodas, tea). Do bring a list of your current medications with you.  If not listed below, you may take your medications as normal. Do wear comfortable clothes (no dresses or overalls) and walking shoes, tennis shoes preferred (No heels or open toe shoes are allowed). Do NOT wear cologne, perfume, aftershave, or  lotions (deodorant is allowed). The test will take approximately 3 to 4 hours to complete If these instructions are not followed, your test will have to be rescheduled.    Follow-Up: At [facility], you and your health needs are our priority.  As part of our continuing mission to provide you with exceptional heart care, we have created designated Provider Care Teams.  These Care Teams include your primary Cardiologist (physician) and Advanced Practice Providers (APPs -  Physician Assistants and Nurse Practitioners) who all work together to provide you with the care you need, when you need it.  We recommend signing up for the patient portal called "MyChart".  Sign up information is provided on this After Visit Summary.  MyChart is used to connect with patients for Virtual Visits (Telemedicine).  Patients are able to view lab/test results, encounter notes, upcoming appointments, etc.  Non-urgent messages can be sent to your provider as well.   To learn more about what you can do with MyChart, go to [email or web address].    Your next appointment:   3 month(s)  Provider:   Kathlyn Sacramento, MD     Other Instructions Bryn [facility]ull[facility]- Long Term Monitor Instructions  Your physician has requested you wear a ZIO patch monitor for 14 days.  This is a single patch monitor. [employer] supplies one patch monitor per enrollment. Additional stickers are not available. Please do not apply patch if you will be having a Nuclear Stress Test,  Echocardiogram, Cardiac CT, MRI, or Chest Xray during the period you would be wearing the  monitor. The patch  cannot be worn during these tests. You cannot remove and re-apply the  ZIO XT patch monitor.  Your ZIO patch monitor will be mailed 3 day USPS to your address on file. It may take 3-5 days  to receive your monitor after you have been enrolled.  Once you have received your monitor, please review the enclosed instructions. Your monitor  has already been  registered assigning a specific monitor serial # to you.  Billing and Patient Assistance Program Information  We have supplied [employer] with any of your insurance information on file for billing purposes. [employer] offers a sliding scale Patient Assistance Program for patients that do not have  insurance, or whose insurance does not completely cover the cost of the ZIO monitor.  You must apply for the Patient Assistance Program to qualify for this discounted rate.  To apply, please call [employer] at [phone], select option 4, select option 2, ask to apply for  Patient Assistance Program. Theodore Demark will ask your household income, and how many people  are in your household. They will quote your out-of-pocket cost based on that information.  [employer] will also be able to set up a 66-month interest-free payment plan if needed.  Applying the monitor   Shave hair from upper left chest.  Hold abrader disc by orange tab. Rub abrader in 40 strokes over the upper left chest as  indicated in your monitor instructions.  Clean area with 4 enclosed alcohol pads. Let dry.  Apply patch as indicated in monitor instructions. Patch will be placed under collarbone on left  side of chest with arrow pointing upward.  Rub patch adhesive wings for 2 minutes. Remove white label marked "1". Remove the white  label marked "2". Rub patch adhesive wings for 2 additional minutes.  While looking in a mirror, press and release button in center of patch. A small green light will  flash 3-4 times. This will be your only indicator that the monitor has been turned on.  Do not shower for the first 24 hours. You may shower after the first 24 hours.  Press the button if you feel a symptom. You will hear a small click. Record Date, Time and  Symptom in the Patient Logbook.  When you are ready to remove the patch, follow instructions on the last 2 pages of Patient  Logbook. Stick patch monitor onto the last page of Patient  Logbook.  Place Patient Logbook in the blue and white box. Use locking tab on box and tape box closed  securely. The blue and white box has prepaid postage on it. Please place it in the mailbox as  soon as possible. Your physician should have your test results approximately 7 days after the  monitor has been [facility] back [facility]  Call [facility]at [phone] you have questions regarding  your ZIO XT patch monitor. Call them immediately if you see an orange light blinking on your  monitor.  If your monitor falls off in less than 4 days, contact our Monitor department at [phone]  If your monitor becomes loose or falls off after 4 days call [employer] at [phone]  suggestions on securing your monitor

## 2022-11-20 NOTE — Progress Notes (Signed)
Cardiology Office Note   Date:  11/20/2022   ID:  Amy Bruce, Amy Bruce 11/19/1950, MRN [id number]  PCP:  London Pepper, MD  Cardiologist:   Kathlyn Sacramento, MD   Chief Complaint  Patient presents with   Follow-up    6 months.      History of Present Illness: Amy Bruce is a 72 y.o. female who is here today for a follow-up visit regarding peripheral arterial disease.  She has known history of peripheral arterial disease, tobacco use, essential hypertension and hyperlipidemia.  Previous CT imaging showed evidence of aortic and coronary calcifications.  She has prolonged history of plantar fasciitis. She has known history of peripheral arterial disease with claudication. Angiography in July 2019 showed diffuse right SFA disease with medium length occlusion in the mid to distal segment.  On the left there was moderate ostial left SFA stenosis with two-vessel runoff below the knee.  She was referred to vascular surgery to consider right femoral-popliteal bypass but medical therapy was pursued after.   Lexiscan Myoview in September 2019 showed no evidence of ischemia with normal ejection fraction. She had back surgery done in 2022.   She had worsening claudication in 2021.  Angiography was done in January 2021 which showed no significant change from previous angiography.  I performed successful orbital atherectomy to the proximal and mid right SFA followed by drug-coated balloon angioplasty.  The distal SFA was treated with drug-eluting stent given that the reentry was subintimal.  Postprocedure ABI improved to normal with mildly decreased toe pressure.  She quit smoking in December, 2022.    She reports numbness and burning sensation in the right thigh area but no calf claudication.  She continues to have intermittent episodes of palpitations and tachycardia almost on a daily basis.  Her heart rate occasionally goes up to 170 bpm on her smart watch.  In addition, she had recent  episodes of substernal chest tightness with exertion which is usually associated with the tachycardia.  She feels fatigued.  Past Medical History:  Diagnosis Date   Anemia    in the past   Anxiety    yrs. 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 Cardiologist is aware.   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, has to have chest xray   Vertigo    since head injury, have to be careful when walking    Past Surgical History:  Procedure Laterality Date   ABDOMINAL AORTOGRAM W/LOWER EXTREMITY N/A 04/02/2018   Procedure: ABDOMINAL AORTOGRAM W/LOWER EXTREMITY;  Surgeon: Wellington Hampshire, MD;  Location: [facility];  Service: Cardiovascular;  Laterality: N/A;   ABDOMINAL  AORTOGRAM W/LOWER EXTREMITY N/A 09/23/2019   Procedure: ABDOMINAL AORTOGRAM W/LOWER EXTREMITY;  Surgeon: Wellington Hampshire, MD;  Location: [facility];  Service: Cardiovascular;  Laterality: N/A;   ABDOMINAL HYSTERECTOMY  1999   CERVICAL SPINE SURGERY  [id number]   C5/6 6/7   COLONOSCOPY     CRANIOTOMY Right 08/23/2020   Procedure: RIGHT CRANIOTOMY HEMATOMA EVACUATION SUBDURAL;  Surgeon: Consuella Lose, MD;  Location: [facility];  Service: Neurosurgery;  Laterality: Right;   ECTOPIC PREGNANCY SURGERY     Left axillary cyst removal 1989     MULTIPLE EXTRACTIONS WITH ALVEOLOPLASTY N/A  10/25/2017   Procedure: EXTRACTION NUMBERS FIVE, SEVEN, EIGHT, NINE, TEN, FIFTEEN, EIGHTEEN WITH ALVEOLOPLASTY, IRRIGATION AND DEBRIDEMENT LEFT SUBMANDIBULAR INFECTION;  Surgeon: Diona Browner, DDS;  Location: [facility];  Service: Oral Surgery;  Laterality: N/A;   PERIPHERAL VASCULAR ATHERECTOMY Right 09/23/2019   Procedure: PERIPHERAL VASCULAR ATHERECTOMY;  Surgeon: Wellington Hampshire, MD;  Location: [facility];  Service: Cardiovascular;  Laterality: Right;  SFA   PERIPHERAL VASCULAR BALLOON ANGIOPLASTY Right 09/23/2019   Procedure: PERIPHERAL VASCULAR BALLOON ANGIOPLASTY;  Surgeon: Wellington Hampshire, MD;  Location: [facility];  Service: Cardiovascular;  Laterality: Right;  SFA   RADIOLOGY WITH ANESTHESIA N/A 11/18/2018   Procedure: MRI OF THE CERVICAL SPINE WITHOUT CONTRAST;  Surgeon: Radiologist, Medication, MD;  Location: [facility];  Service: Radiology;  Laterality: N/A;   RADIOLOGY WITH ANESTHESIA N/A 12/03/2019   Procedure: MRI WITH ANESTHESIA CERVICAL WITH AND WITHOUT CONTRAST;  Surgeon: Radiologist, Medication, MD;  Location: [facility];  Service: Radiology;  Laterality: N/A;   RADIOLOGY WITH ANESTHESIA N/A 03/02/2021   Procedure: MRI CERVICAL SPINE WITH AND WITHOUT CONTRAST;  Surgeon: Radiologist, Medication, MD;  Location: [facility];  Service: Radiology;  Laterality: N/A;   RADIOLOGY WITH ANESTHESIA N/A 09/25/2022   Procedure: MRI WITH ANESTHESIA CERVICAL SPINE WITHOUT CONTRAST;  Surgeon: Radiologist, Medication, MD;  Location: [facility];  Service: Radiology;  Laterality: N/A;   TONSILECTOMY, ADENOIDECTOMY, BILATERAL MYRINGOTOMY AND TUBES  1981   put not aware of tubes being put in ears   TUBAL LIGATION  1981     Current Outpatient Medications  Medication Sig Dispense Refill   acetaminophen (TYLENOL) 325 MG tablet Take 2 tablets (650 mg total) by mouth every 4 (four) hours as needed for mild pain (temp > 100.5).     albuterol (VENTOLIN HFA) 108 (90 Base) MCG/ACT inhaler INHALE 1 TO 2 PUFFS INTO  THE LUNGS EVERY 4 HOURS AS NEEDED FOR WHEEZING 8.5 g 3   atorvastatin (LIPITOR) 80 MG tablet TAKE 1 TABLET (80 MG TOTAL) BY MOUTH DAILY. 90 tablet 3   Carbamide Peroxide (EAR WAX REMOVAL KIT OT) Place 5-10 drops into both ears as needed (ear wax buildup).     clopidogrel (PLAVIX) 75 MG tablet Take 1 tablet (75 mg total) by mouth daily. 90 tablet 3   cyclobenzaprine (FLEXERIL) 10 MG tablet Take 10 mg by mouth in the morning and at bedtime.     fexofenadine (ALLEGRA) 180 MG tablet Take 180 mg by mouth in the morning.     fluticasone (FLONASE) 50 MCG/ACT nasal spray Place 2 sprays into both nostrils daily as needed for allergies or rhinitis.     Omega-3 Fatty Acids (FISH OIL) 1000 MG CAPS Take 1,000 mg by mouth in the morning.     topiramate (TOPAMAX) 50 MG tablet Take 1 tablet (50 mg total) by mouth 2 (two) times daily. 60 tablet 2   vitamin  B-12 (CYANOCOBALAMIN) 100 MCG tablet Take 100 mcg by mouth daily.     No current facility-administered medications for this visit.    Allergies:   Carisoprodol and Duloxetine hcl    Social History:  The patient  reports that she quit smoking about 2 years ago. Her smoking use included cigarettes. She has a 33.00 pack-year smoking history. She has never used smokeless tobacco. She reports that she does not currently use alcohol after a past usage of about 2.0 standard drinks of alcohol per week. She reports that she does not use drugs.   Family History:  The patient's family history includes Ovarian cancer in her mother.    ROS:  Please see the history of present illness.   Otherwise, review of systems are positive for none.   All other systems are reviewed and negative.    PHYSICAL EXAM: VS:  BP 136/64 (BP Location: Left Arm, Patient Position: Sitting, Cuff Size: Normal)   Pulse 89   Ht '5\' 2"'$  (1.575 m)   Wt 169 lb (76.7 kg)   BMI 30.91 kg/m  , BMI Body mass index is 30.91 kg/m. GEN: Well nourished, well developed, in no acute distress  HEENT:  normal  Neck: no JVD, carotid bruits, or masses Cardiac: RRR; no murmurs, rubs, or gallops Respiratory:  clear to auscultation bilaterally, normal work of breathing GI: soft, nontender, nondistended, + BS MS: no deformity or atrophy  Skin: warm and dry, no rash Neuro:  Strength and sensation are intact Psych: euthymic mood, full affect Vascular: Femoral pulses are +1 bilaterally.  Distal pulses are faint but palpable bilaterally   EKG:  EKG is ordered today. EKG showed normal sinus rhythm with nonspecific ST changes.  Recent Labs: 09/25/2022: BUN 9; Creatinine, Ser 0.93; Hemoglobin 12.8; Platelets 213; Potassium 4.1; Sodium 139    Lipid Panel No results found for: "CHOL", "TRIG", "HDL", "CHOLHDL", "VLDL", "LDLCALC", "LDLDIRECT"    Wt Readings from Last 3 Encounters:  11/20/22 169 lb (76.7 kg)  09/25/22 162 lb (73.5 kg)  08/27/22 168 lb (76.2 kg)           No data to display            ASSESSMENT AND PLAN:  1. Peripheral arterial disease: Status post right SFA endovascular intervention for severe right calf claudication.  No recurrent Claudication.  Symptoms of numbness and tingling in the right thigh are likely due to neuropathy.  She is due for repeat Doppler studies which were requested.  2.  Previous tobacco use: No relapse.  3.  Hyperlipidemia: Continue high-dose atorvastatin with a target LDL of less than 70.  4.  Essential hypertension: Blood pressure is well controlled.  She is currently not requiring any antihypertensive medications.  She used to be on lisinopril.  5.  Chronic venous insufficiency: She uses knee-high support stockings as needed.  6.  Intermittent tachycardia with almost daily palpitations.  Her symptoms persisted for few months.  I requested a 2 weeks ZIO monitor.  7.  Exertional chest pain: Worrisome for new onset angina.  I requested a Lexiscan Myoview.  She is not able to exercise on a treadmill.   Disposition:   FU with me in 3  months.  Signed,  Kathlyn Sacramento, MD  11/20/2022 9:00 AM    [place]

## 2022-11-21 ENCOUNTER — Telehealth (HOSPITAL_COMMUNITY): Payer: Self-pay | Admitting: *Deleted

## 2022-11-21 NOTE — Telephone Encounter (Signed)
Gave detailed instructions to patient about her STRESS TEST on 11/23/22.

## 2022-11-23 ENCOUNTER — Ambulatory Visit (HOSPITAL_COMMUNITY): Payer: 59 | Attending: Cardiology

## 2022-11-23 DIAGNOSIS — R079 Chest pain, unspecified: Secondary | ICD-10-CM | POA: Diagnosis not present

## 2022-11-23 LAB — MYOCARDIAL PERFUSION IMAGING
LV dias vol: 32 mL (ref 46–106)
LV sys vol: 7 mL
Nuc Stress EF: 79 %
Peak HR: 101 {beats}/min
Rest HR: 88 {beats}/min
Rest Nuclear Isotope Dose: 10.6 mCi
SDS: 2
SRS: 2
SSS: 4
ST Depression (mm): 0 mm
Stress Nuclear Isotope Dose: 32.1 mCi
TID: 0.96

## 2022-11-23 MED ORDER — TECHNETIUM TC 99M TETROFOSMIN IV KIT
10.6000 | PACK | Freq: Once | INTRAVENOUS | Status: AC | PRN
  Administered 2022-11-23: 10.6 via INTRAVENOUS

## 2022-11-23 MED ORDER — REGADENOSON 0.4 MG/5ML IV SOLN
0.4000 mg | Freq: Once | INTRAVENOUS | Status: AC
  Administered 2022-11-23: 0.4 mg via INTRAVENOUS

## 2022-11-23 MED ORDER — TECHNETIUM TC 99M TETROFOSMIN IV KIT
32.1000 | PACK | Freq: Once | INTRAVENOUS | Status: AC | PRN
  Administered 2022-11-23: 32.1 via INTRAVENOUS

## 2022-11-26 DIAGNOSIS — R000 Tachycardia, unspecified: Secondary | ICD-10-CM

## 2022-12-04 ENCOUNTER — Other Ambulatory Visit: Payer: Self-pay | Admitting: Cardiovascular Disease

## 2022-12-04 NOTE — Telephone Encounter (Signed)
Refill Request.  

## 2022-12-05 ENCOUNTER — Ambulatory Visit (HOSPITAL_COMMUNITY)
Admission: RE | Admit: 2022-12-05 | Discharge: 2022-12-05 | Disposition: A | Discharge status: Home / Self Care | Payer: 59 | Source: Ambulatory Visit | Attending: Cardiovascular Disease | Admitting: Cardiovascular Disease

## 2022-12-05 DIAGNOSIS — I739 Peripheral vascular disease, unspecified: Secondary | ICD-10-CM | POA: Insufficient documentation

## 2022-12-05 LAB — VAS US ABI WITH/WO TBI
Left ABI: 0.83
Right ABI: 0.8

## 2022-12-06 ENCOUNTER — Other Ambulatory Visit: Payer: Self-pay | Admitting: Cardiovascular Disease

## 2022-12-06 ENCOUNTER — Other Ambulatory Visit: Payer: Self-pay | Admitting: *Deleted

## 2022-12-06 DIAGNOSIS — I739 Peripheral vascular disease, unspecified: Secondary | ICD-10-CM

## 2022-12-06 NOTE — Telephone Encounter (Signed)
Refill Request.  

## 2022-12-10 DIAGNOSIS — G8929 Other chronic pain: Secondary | ICD-10-CM | POA: Diagnosis not present

## 2022-12-10 DIAGNOSIS — R42 Dizziness and giddiness: Secondary | ICD-10-CM | POA: Diagnosis not present

## 2022-12-10 DIAGNOSIS — R8290 Unspecified abnormal findings in urine: Secondary | ICD-10-CM | POA: Diagnosis not present

## 2022-12-10 DIAGNOSIS — I700 Atherosclerosis of aorta: Secondary | ICD-10-CM | POA: Diagnosis not present

## 2022-12-10 DIAGNOSIS — I10 Essential (primary) hypertension: Secondary | ICD-10-CM | POA: Diagnosis not present

## 2022-12-10 DIAGNOSIS — J439 Emphysema, unspecified: Secondary | ICD-10-CM | POA: Diagnosis not present

## 2022-12-10 DIAGNOSIS — E785 Hyperlipidemia, unspecified: Secondary | ICD-10-CM | POA: Diagnosis not present

## 2022-12-10 DIAGNOSIS — H6123 Impacted cerumen, bilateral: Secondary | ICD-10-CM | POA: Diagnosis not present

## 2022-12-10 DIAGNOSIS — R7303 Prediabetes: Secondary | ICD-10-CM | POA: Diagnosis not present

## 2022-12-10 DIAGNOSIS — Z0000 Encounter for general adult medical examination without abnormal findings: Secondary | ICD-10-CM | POA: Diagnosis not present

## 2022-12-10 DIAGNOSIS — I739 Peripheral vascular disease, unspecified: Secondary | ICD-10-CM | POA: Diagnosis not present

## 2022-12-14 DIAGNOSIS — R000 Tachycardia, unspecified: Secondary | ICD-10-CM | POA: Diagnosis not present

## 2022-12-18 ENCOUNTER — Other Ambulatory Visit: Payer: Self-pay

## 2022-12-18 MED ORDER — METOPROLOL SUCCINATE ER 25 MG PO TB24: 25.0000 mg | ORAL_TABLET | Freq: Every day | ORAL | 3 refills | Status: DC

## 2023-01-04 ENCOUNTER — Other Ambulatory Visit: Payer: Self-pay | Admitting: Acute Care

## 2023-01-14 ENCOUNTER — Other Ambulatory Visit (HOSPITAL_COMMUNITY): Payer: Self-pay

## 2023-02-05 ENCOUNTER — Other Ambulatory Visit: Payer: Self-pay | Admitting: Registered Nurse

## 2023-02-05 NOTE — Telephone Encounter (Signed)
Last refill 08/2022

## 2023-02-07 ENCOUNTER — Other Ambulatory Visit (HOSPITAL_COMMUNITY): Payer: Self-pay

## 2023-02-12 ENCOUNTER — Ambulatory Visit: Payer: 59 | Admitting: Cardiovascular Disease

## 2023-03-12 ENCOUNTER — Ambulatory Visit: Payer: 59 | Attending: Cardiovascular Disease | Admitting: Cardiovascular Disease

## 2023-03-12 ENCOUNTER — Encounter: Payer: Self-pay | Admitting: Cardiovascular Disease

## 2023-03-12 VITALS — BP 122/68 | HR 97 | Ht 60.0 in | Wt 170.0 lb

## 2023-03-12 DIAGNOSIS — E785 Hyperlipidemia, unspecified: Secondary | ICD-10-CM | POA: Diagnosis not present

## 2023-03-12 DIAGNOSIS — I10 Essential (primary) hypertension: Secondary | ICD-10-CM | POA: Diagnosis not present

## 2023-03-12 DIAGNOSIS — I872 Venous insufficiency (chronic) (peripheral): Secondary | ICD-10-CM

## 2023-03-12 DIAGNOSIS — R000 Tachycardia, unspecified: Secondary | ICD-10-CM | POA: Diagnosis not present

## 2023-03-12 DIAGNOSIS — I739 Peripheral vascular disease, unspecified: Secondary | ICD-10-CM | POA: Diagnosis not present

## 2023-03-12 MED ORDER — ATORVASTATIN CALCIUM 80 MG PO TABS: ORAL_TABLET | ORAL | 3 refills | Status: DC

## 2023-03-12 MED ORDER — CLOPIDOGREL BISULFATE 75 MG PO TABS: ORAL_TABLET | ORAL | 3 refills | Status: AC

## 2023-03-12 NOTE — Patient Instructions (Signed)
Medication Instructions:  No changes *If you need a refill on your cardiac medications before your next appointment, please call your pharmacy*   Lab Work: None ordered If you have labs (blood work) drawn today and your tests are completely normal, you will receive your results only by: MyChart Message (if you have MyChart) OR A paper copy in the mail If you have any lab test that is abnormal or we need to change your treatment, we will call you to review the results.   Testing/Procedures: None ordered   Follow-Up: At [facility], you and your health needs are our priority.  As part of our continuing mission to provide you with exceptional heart care, we have created designated Provider Care Teams.  These Care Teams include your primary Cardiologist (physician) and Advanced Practice Providers (APPs -  Physician Assistants and Nurse Practitioners) who all work together to provide you with the care you need, when you need it.  We recommend signing up for the patient portal called "MyChart".  Sign up information is provided on this After Visit Summary.  MyChart is used to connect with patients for Virtual Visits (Telemedicine).  Patients are able to view lab/test results, encounter notes, upcoming appointments, etc.  Non-urgent messages can be sent to your provider as well.   To learn more about what you can do with MyChart, go to [email or web address].    Your next appointment:   9 month(s)  Provider:   Muhammad Arida, MD     

## 2023-03-12 NOTE — Progress Notes (Signed)
Cardiology Office Note   Date:  03/12/2023   ID:  Amy Bruce, Amy Bruce [birth year], MRN [id number]  PCP:  Farris Has, MD  Cardiologist:   Lorine Bears, MD   No chief complaint on file.     History of Present Illness: Amy Bruce is a 72 y.o. female who is here today for a follow-up visit regarding peripheral arterial disease.  She has known history of peripheral arterial disease, tobacco use, essential hypertension and hyperlipidemia.  Previous CT imaging showed evidence of aortic and coronary calcifications.  She has prolonged history of plantar fasciitis. She has known history of peripheral arterial disease with claudication. Angiography in July 2019 showed diffuse right SFA disease with medium length occlusion in the mid to distal segment.  On the left there was moderate ostial left SFA stenosis with two-vessel runoff below the knee.   Lexiscan Myoview in September 2019 showed no evidence of ischemia with normal ejection fraction. She had back surgery done in 2022.   She had worsening claudication in 2021.  Angiography was done in January 2021 which showed no significant change from previous angiography.  I performed successful orbital atherectomy to the proximal and mid right SFA followed by drug-coated balloon angioplasty.  The distal SFA was treated with drug-eluting stent given that the reentry was subintimal.  Postprocedure ABI improved to normal with mildly decreased toe pressure.  She quit smoking in December, 2022.    She reports numbness and burning sensation in the right thigh area but no calf claudication.  She does have history of neuropathy.  During the visit, she complained of intermittent palpitations and tachycardia with increased shortness of breath and occasional symptoms of chest pain.  She underwent a Lexiscan Myoview which showed no evidence of ischemia with normal ejection fraction.  An outpatient monitor was done and showed short runs of SVT.  I  prescribed Toprol 25 mg once daily but her symptoms improved and she did not have to take the medication.  She feels better overall.   Past Medical History:  Diagnosis Date   Anemia    in the past   Anxiety    yrs. 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 Cardiologist is aware.   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, has to have chest xray   Vertigo    since head injury, have to be careful when walking    Past Surgical History:  Procedure Laterality Date   ABDOMINAL AORTOGRAM W/LOWER EXTREMITY N/A 04/02/2018   Procedure: ABDOMINAL AORTOGRAM W/LOWER EXTREMITY;  Surgeon: Iran Ouch, MD;  Location: [facility];  Service: Cardiovascular;  Laterality: N/A;  ABDOMINAL AORTOGRAM W/LOWER EXTREMITY N/A 09/23/2019   Procedure: ABDOMINAL AORTOGRAM W/LOWER EXTREMITY;  Surgeon: Iran Ouch, MD;  Location: [facility];  Service: Cardiovascular;  Laterality: N/A;   ABDOMINAL HYSTERECTOMY  1999   CERVICAL SPINE SURGERY  [id number]   C5/6 6/7   COLONOSCOPY     CRANIOTOMY Right 08/23/2020   Procedure: RIGHT CRANIOTOMY HEMATOMA EVACUATION SUBDURAL;  Surgeon: Lisbeth Renshaw, MD;  Location: [facility];  Service: Neurosurgery;  Laterality: Right;   ECTOPIC PREGNANCY SURGERY     Left axillary cyst removal 1989     MULTIPLE EXTRACTIONS WITH  ALVEOLOPLASTY N/A 10/25/2017   Procedure: EXTRACTION NUMBERS FIVE, SEVEN, EIGHT, NINE, TEN, FIFTEEN, EIGHTEEN WITH ALVEOLOPLASTY, IRRIGATION AND DEBRIDEMENT LEFT SUBMANDIBULAR INFECTION;  Surgeon: Ocie Doyne, DDS;  Location: [facility];  Service: Oral Surgery;  Laterality: N/A;   PERIPHERAL VASCULAR ATHERECTOMY Right 09/23/2019   Procedure: PERIPHERAL VASCULAR ATHERECTOMY;  Surgeon: Iran Ouch, MD;  [facility];  Service: Cardiovascular;  Laterality: Right;  SFA   PERIPHERAL VASCULAR BALLOON ANGIOPLASTY Right 09/23/2019   Procedure: PERIPHERAL VASCULAR BALLOON ANGIOPLASTY;  Surgeon: Iran Ouch, MD;  [facility];  Service: Cardiovascular;  Laterality: Right;  SFA   RADIOLOGY WITH ANESTHESIA N/A 11/18/2018   Procedure: MRI OF THE CERVICAL SPINE WITHOUT CONTRAST;  Surgeon: Radiologist, Medication, MD;  [facility];  Service: Radiology;  Laterality: N/A;   RADIOLOGY WITH ANESTHESIA N/A 12/03/2019   Procedure: MRI WITH ANESTHESIA CERVICAL WITH AND WITHOUT CONTRAST;  Surgeon: Radiologist, Medication, MD;  [facility];  Service: Radiology;  Laterality: N/A;   RADIOLOGY WITH ANESTHESIA N/A 03/02/2021   Procedure: MRI CERVICAL SPINE WITH AND WITHOUT CONTRAST;  Surgeon: Radiologist, Medication, MD;  [facility];  Service: Radiology;  Laterality: N/A;   RADIOLOGY WITH ANESTHESIA N/A 09/25/2022   Procedure: MRI WITH ANESTHESIA CERVICAL SPINE WITHOUT CONTRAST;  Surgeon: Radiologist, Medication, MD;  [facility];  Service: Radiology;  Laterality: N/A;   TONSILECTOMY, ADENOIDECTOMY, BILATERAL MYRINGOTOMY AND TUBES  1981   put not aware of tubes being put in ears   TUBAL LIGATION  1981     Current Outpatient Medications  Medication Sig Dispense Refill   albuterol (VENTOLIN HFA) 108 (90 Base) MCG/ACT inhaler INHALE 1 TO 2 PUFFS INTO THE LUNGS EVERY 4 HOURS AS NEEDED FOR WHEEZING 8.5 g 3   atorvastatin (LIPITOR) 80 MG tablet TAKE 1 TABLET(80 MG) BY MOUTH  DAILY 90 tablet 3   Carbamide Peroxide (EAR WAX REMOVAL KIT OT) Place 5-10 drops into both ears as needed (ear wax buildup).     clopidogrel (PLAVIX) 75 MG tablet TAKE 1 TABLET(75 MG) BY MOUTH DAILY 90 tablet 3   cyclobenzaprine (FLEXERIL) 10 MG tablet Take 10 mg by mouth in the morning and at bedtime.     fexofenadine (ALLEGRA) 180 MG tablet Take 180 mg by mouth in the morning.     fluticasone (FLONASE) 50 MCG/ACT nasal spray Place 2 sprays into both nostrils daily as needed for allergies or rhinitis.     metoprolol succinate (TOPROL XL) 25 MG 24 hr tablet Take 1 tablet (25 mg total) by mouth daily. 90 tablet 3   Omega-3 Fatty Acids (FISH OIL) 1000 MG CAPS Take 1,000 mg by mouth in the morning.     topiramate (TOPAMAX) 50 MG tablet Take 1 tablet (50 mg total) by mouth 2 (two) times daily. 60 tablet 2   vitamin B-12 (CYANOCOBALAMIN) 100 MCG tablet Take 100 mcg by  mouth daily.     No current facility-administered medications for this visit.    Allergies:   Carisoprodol and Duloxetine hcl    Social History:  The patient  reports that she quit smoking about 2 years ago. Her smoking use included cigarettes. She has a 33.00 pack-year smoking history. She has never used smokeless tobacco. She reports that she does not currently use alcohol after a past usage of about 2.0 standard drinks of alcohol per week. She reports that she does not use drugs.   Family History:  The patient's family history includes Ovarian cancer in her mother.    ROS:  Please see the history of present illness.   Otherwise, review of systems are positive for none.   All other systems are reviewed and negative.    PHYSICAL EXAM: VS:  BP 122/68 (BP Location: Right Arm, Patient Position: Sitting)   Pulse 97   Ht 5' (1.524 m)   Wt 170 lb (77.1 kg)   SpO2 99%   BMI 33.20 kg/m  , BMI Body mass index is 33.2 kg/m. GEN: Well nourished, well developed, in no acute distress  HEENT: normal  Neck: no JVD, carotid bruits, or  masses Cardiac: RRR; no murmurs, rubs, or gallops Respiratory:  clear to auscultation bilaterally, normal work of breathing GI: soft, nontender, nondistended, + BS MS: no deformity or atrophy  Skin: warm and dry, no rash Neuro:  Strength and sensation are intact Psych: euthymic mood, full affect    EKG:  EKG is not ordered today.   Recent Labs: 09/25/2022: BUN 9; Creatinine, Ser 0.93; Hemoglobin 12.8; Platelets 213; Potassium 4.1; Sodium 139    Lipid Panel No results found for: "CHOL", "TRIG", "HDL", "CHOLHDL", "VLDL", "LDLCALC", "LDLDIRECT"    Wt Readings from Last 3 Encounters:  03/12/23 170 lb (77.1 kg)  11/20/22 169 lb (76.7 kg)  09/25/22 162 lb (73.5 kg)           No data to display            ASSESSMENT AND PLAN:  1. Peripheral arterial disease: Status post right SFA endovascular intervention for severe right calf claudication.  No recurrent Claudication.  Symptoms of numbness and tingling in the right thigh are likely due to neuropathy.  Most recent Doppler studies showed patent right SFA with no significant restenosis.  2.  Previous tobacco use: No relapse.  3.  Hyperlipidemia: Continue high-dose atorvastatin.  I reviewed most recent labs done in April which showed an LDL of 71.  4.  Essential hypertension: Blood pressure is well controlled.  She is currently not requiring any antihypertensive medications.  She used to be on lisinopril.  5.  Chronic venous insufficiency: She uses knee-high support stockings as needed.  6.  Intermittent tachycardia: Outpatient monitor showed short runs of SVT.  Her symptoms improved without interventions and she has not started Toprol.    Disposition:   FU with me in 9 months.  Signed,  Lorine Bears, MD  03/12/2023 8:40 AM    [facility]

## 2023-04-30 NOTE — Progress Notes (Signed)
Subjective:    Patient ID: Amy Bruce, female    DOB: June 08, 1951, 72 y.o.   MRN: [id number]  HPI:  1) chronic pain syndrome: -feels pain in right upper back muscle, this radiates to her shoulder and neck and down to her hand -muscle spasms and "goes crazy" -she could be sitting doing nothing and it starts to spasm -her neurosurgeon Dr. Conchita Paris told her when he released her that she is going to be dealing with muscle spasms and headaches forever.   2) vascular headache: -has regular headaches  Amy Bruce is a 72 y.o. female who returns for follow up appointment for chronic pain and medication refill. She states her pain is located in her neck radiating into her right shoulder, right arm and tingling and burning into her right hand. She rates her pain 10. Her current exercise regime is walking with cane.  Amy Bruce seen Dr. Conchita Paris on 07/02/2022, she presented to Dr Conchita Paris with complaints of right sided neck and arm pain. Dr Conchita Paris recommended MRI of Cervical Spine without contrast, Amy Bruce stated she was unable to have MRI without anesthesia, due to her having a panic attack, when she tried in the past. Dr. Conchita Paris doesn't order MRI with sedation. In the past Dr Allena Katz ordered Cervical MRI WO Contrast with sedation ,on  03/02/2021. Dr Allena Katz is no longer a provider at our office, I spoke with Dr Marijean Niemann regarding the above, she agrees with ordering the Cervical MRI WO Contrast with sedation. Order was placed.   Dr Conchita Paris will review the MRI, and will discuss any further treatment options from a neurosurgical standpoint.      Pain Inventory Average Pain 8 Pain Right Now 8 My pain is constant, sharp, stabbing, tingling, and aching  In the last 24 hours, has pain interfered with the following? General activity 8 Relation with others 8 Enjoyment of life 9 What TIME of day is your pain at its worst? morning , daytime, evening, and night Sleep (in general)  Fair  Pain is worse with: walking, bending, sitting, inactivity, standing, and some activites Pain improves with: rest Relief from Meds: 1  Family History  Problem Relation Age of Onset   Ovarian cancer Mother    Social History   Socioeconomic History   Marital status: Divorced    Spouse name: Not on file   Number of children: Not on file   Years of education: Not on file   Highest education level: Not on file  Occupational History   Not on file  Tobacco Use   Smoking status: Former    Current packs/day: 0.00    Average packs/day: 1 pack/day for 33.0 years (33.0 ttl pk-yrs)    Types: Cigarettes    Start date: 08/1987    Quit date: 08/2020    Years since quitting: 2.7   Smokeless tobacco: Never   Tobacco comments:    2 ciggs a day  Vaping Use   Vaping status: Never Used  Substance and Sexual Activity   Alcohol use: Not Currently    Alcohol/week: 2.0 standard drinks of alcohol    Types: 2 Shots of liquor per week    Comment: "not since 2021" - 03/01/21   Drug use: No   Sexual activity: Not on file  Other Topics Concern   Not on file  Social History Narrative   Not on file   Social Determinants of Health   Financial Resource Strain: Not on file  Food Insecurity: Not  on file  Transportation Needs: Not on file  Physical Activity: Not on file  Stress: Not on file  Social Connections: Unknown (01/23/2022)   [facility] from [facility]   Social Network    Social Network: Not on file   Past Surgical History:  Procedure Laterality Date   ABDOMINAL AORTOGRAM W/LOWER EXTREMITY N/A 04/02/2018   Procedure: ABDOMINAL AORTOGRAM W/LOWER EXTREMITY;  Surgeon: Iran Ouch, MD;  Location: [facility];  Service: Cardiovascular;  Laterality: N/A;   ABDOMINAL AORTOGRAM W/LOWER EXTREMITY N/A 09/23/2019   Procedure: ABDOMINAL AORTOGRAM W/LOWER EXTREMITY;  Surgeon: Iran Ouch, MD;  Location: [facility];  Service: Cardiovascular;  Laterality: N/A;    ABDOMINAL HYSTERECTOMY  1999   CERVICAL SPINE SURGERY  1191,4782   C5/6 6/7   COLONOSCOPY     CRANIOTOMY Right 08/23/2020   Procedure: RIGHT CRANIOTOMY HEMATOMA EVACUATION SUBDURAL;  Surgeon: Lisbeth Renshaw, MD;  Location: [facility];  Service: Neurosurgery;  Laterality: Right;   ECTOPIC PREGNANCY SURGERY     Left axillary cyst removal 1989     MULTIPLE EXTRACTIONS WITH ALVEOLOPLASTY N/A 10/25/2017   Procedure: EXTRACTION NUMBERS FIVE, SEVEN, EIGHT, NINE, TEN, FIFTEEN, EIGHTEEN WITH ALVEOLOPLASTY, IRRIGATION AND DEBRIDEMENT LEFT SUBMANDIBULAR INFECTION;  Surgeon: Ocie Doyne, DDS;  Location: [facility];  Service: Oral Surgery;  Laterality: N/A;   PERIPHERAL VASCULAR ATHERECTOMY Right 09/23/2019   Procedure: PERIPHERAL VASCULAR ATHERECTOMY;  Surgeon: Iran Ouch, MD;  Location: [facility];  Service: Cardiovascular;  Laterality: Right;  SFA   PERIPHERAL VASCULAR BALLOON ANGIOPLASTY Right 09/23/2019   Procedure: PERIPHERAL VASCULAR BALLOON ANGIOPLASTY;  Surgeon: Iran Ouch, MD;  [facility]: [facility];  Service: Cardiovascular;  Laterality: Right;  SFA   RADIOLOGY WITH ANESTHESIA N/A 11/18/2018   Procedure: MRI OF THE CERVICAL SPINE WITHOUT CONTRAST;  Surgeon: Radiologist, Medication, MD;  Location: [facility];  Service: Radiology;  Laterality: N/A;   RADIOLOGY WITH ANESTHESIA N/A 12/03/2019   Procedure: MRI WITH ANESTHESIA CERVICAL WITH AND WITHOUT CONTRAST;  Surgeon: Radiologist, Medication, MD;  Location: [facility];  Service: Radiology;  Laterality: N/A;   RADIOLOGY WITH ANESTHESIA N/A 03/02/2021   Procedure: MRI CERVICAL SPINE WITH AND WITHOUT CONTRAST;  Surgeon: Radiologist, Medication, MD;  [facility]: [facility];  Service: Radiology;  Laterality: N/A;   RADIOLOGY WITH ANESTHESIA N/A 09/25/2022   Procedure: MRI WITH ANESTHESIA CERVICAL SPINE WITHOUT CONTRAST;  Surgeon: Radiologist, Medication, MD;  Location: [facility];  Service: Radiology;  Laterality: N/A;   TONSILECTOMY, ADENOIDECTOMY,  BILATERAL MYRINGOTOMY AND TUBES  1981   put not aware of tubes being put in ears   TUBAL LIGATION  1981   Past Surgical History:  Procedure Laterality Date   ABDOMINAL AORTOGRAM W/LOWER EXTREMITY N/A 04/02/2018   Procedure: ABDOMINAL AORTOGRAM W/LOWER EXTREMITY;  Surgeon: Iran Ouch, MD;  [facility]: [facility];  Service: Cardiovascular;  Laterality: N/A;   ABDOMINAL AORTOGRAM W/LOWER EXTREMITY N/A 09/23/2019   Procedure: ABDOMINAL AORTOGRAM W/LOWER EXTREMITY;  Surgeon: Iran Ouch, MD;  [facility]: [facility];  Service: Cardiovascular;  Laterality: N/A;   ABDOMINAL HYSTERECTOMY  1999   CERVICAL SPINE SURGERY  [id number]   C5/6 6/7   COLONOSCOPY     CRANIOTOMY Right 08/23/2020   Procedure: RIGHT CRANIOTOMY HEMATOMA EVACUATION SUBDURAL;  Surgeon: Lisbeth Renshaw, MD;  [facility];  Service: Neurosurgery;  Laterality: Right;   ECTOPIC PREGNANCY SURGERY     Left axillary cyst removal 1989     MULTIPLE EXTRACTIONS WITH ALVEOLOPLASTY  N/A 10/25/2017   Procedure: EXTRACTION NUMBERS FIVE, SEVEN, EIGHT, NINE, TEN, FIFTEEN, EIGHTEEN WITH ALVEOLOPLASTY, IRRIGATION AND DEBRIDEMENT LEFT SUBMANDIBULAR INFECTION;  Surgeon: Ocie Doyne, DDS;  Location: [facility];  Service: Oral Surgery;  Laterality: N/A;   PERIPHERAL VASCULAR ATHERECTOMY Right 09/23/2019   Procedure: PERIPHERAL VASCULAR ATHERECTOMY;  Surgeon: Iran Ouch, MD;  [facility]: [facility];  Service: Cardiovascular;  Laterality: Right;  SFA   PERIPHERAL VASCULAR BALLOON ANGIOPLASTY Right 09/23/2019   Procedure: PERIPHERAL VASCULAR BALLOON ANGIOPLASTY;  Surgeon: Iran Ouch, MD;  [facility];  Service: Cardiovascular;  Laterality: Right;  SFA   RADIOLOGY WITH ANESTHESIA N/A 11/18/2018   Procedure: MRI OF THE CERVICAL SPINE WITHOUT CONTRAST;  Surgeon: Radiologist, Medication, MD;  [facility]: [facility];  Service: Radiology;  Laterality: N/A;   RADIOLOGY WITH ANESTHESIA N/A 12/03/2019   Procedure:  MRI WITH ANESTHESIA CERVICAL WITH AND WITHOUT CONTRAST;  Surgeon: Radiologist, Medication, MD;  [facility]: [facility];  Service: Radiology;  Laterality: N/A;   RADIOLOGY WITH ANESTHESIA N/A 03/02/2021   Procedure: MRI CERVICAL SPINE WITH AND WITHOUT CONTRAST;  Surgeon: Radiologist, Medication, MD;  [facility]: [facility];  Service: Radiology;  Laterality: N/A;   RADIOLOGY WITH ANESTHESIA N/A 09/25/2022   Procedure: MRI WITH ANESTHESIA CERVICAL SPINE WITHOUT CONTRAST;  Surgeon: Radiologist, Medication, MD;  [facility]: [facility];  Service: Radiology;  Laterality: N/A;   TONSILECTOMY, ADENOIDECTOMY, BILATERAL MYRINGOTOMY AND TUBES  1981   put not aware of tubes being put in ears   TUBAL LIGATION  1981   Past Medical History:  Diagnosis Date   Anemia    in the past   Anxiety    yrs. 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 Cardiologist is aware.   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, has to have chest xray   Vertigo    since head injury, have to be careful when walking   BP (!) 172/92   Pulse 91   Ht 5' (1.524 m)   Wt 171 lb 9.6 oz (77.8 kg)   SpO2 98%   BMI 33.51 kg/m   Opioid Risk Score:   Fall Risk Score:  `1  Depression screen [facility] 2/9     08/27/2022    8:29 AM 06/18/2022    1:38 PM 05/25/2021    8:31 AM 02/22/2021    8:26 AM 01/02/2021    1:35 PM 04/14/2020    10:18 AM 11/20/2018   10:53 AM  Depression screen PHQ 2/9  Decreased Interest 0 0 0 0 0 0 0  Down, Depressed, Hopeless 0 0 0 0 0 0 0  PHQ - 2 Score 0 0 0  0 0 0 0    Review of Systems  Constitutional: Negative.   HENT: Negative.    Eyes: Negative.   Respiratory: Negative.    Cardiovascular: Negative.   Gastrointestinal: Negative.   Endocrine: Negative.   Genitourinary: Negative.   Musculoskeletal:  Positive for gait problem and neck pain.       Pain in the right shoulder & right arm, left torso, left arm & left shoulder  Skin: Negative.   Allergic/Immunologic: Negative.   Hematological:  Bruises/bleeds easily.       Plavix  Psychiatric/Behavioral: Negative.    All other systems reviewed and are negative.      Objective:     Gen: no distress, normal appearing HEENT: oral mucosa pink and moist, NCAT Cardio: Reg rate Chest: normal effort, normal rate of breathing Abd: soft, non-distended Ext: no edema Psych: pleasant, normal affect Skin: intact Neuro: Alert and oriented x3 Musculoskeletal: Has full range of motion of right shoulder     Assessment & Plan:  Chronic pain 2/2 to Cervicalgia/ Cervical Radiculitis: She is scheduled for Cervical MRI WO Contrast: Dr Conchita Paris Following.  Continue HEP as Tolerated. Continue to Monitor. Neurosurgery Following. 08/27/2022 -discussed that medications help her -continue tens unit  2. Lumbar Radiculitis: Continue  Topamax 25 mg one tablet twice a day . Continue HEP as tolerated. Continue to monitor. Neurosurgery Following. 08/27/2022  3.Muscle Spasm: Continue current medication regimen with Cyclobenzaprine  Continue to monitor. 08/27/2022 -discussed that she has tried tizanidine and willing to try again  Prescribed Zynex Nexwave  heating/cooling blanket   Prescribing Home Zynex NexWave Stimulator Device and supplies as needed. IFC, NMES and TENS medically necessary Treatment Rx: Daily @ 30-40 minutes per treatment PRN. Zynex  NexWave only, no substitutions. Treatment Goals: 1) To reduce and/or eliminate pain 2) To improve functional capacity and Activities of daily living 3) To reduce or prevent the need for oral medications 4) To improve circulation in the injured region 5) To decrease or prevent muscle spasm and muscle atrophy 6) To provide a self-management tool to the patient The patient has not sufficiently improved with conservative care. Numerous studies indexed by Medline and PubMed.gov have shown Neuromuscular, Interferential, and TENS stimulators to reduce pain, improve function, and reduce medication use in injured patients. Continued use of this evidence based, safe, drug free treatment is both reasonable and medically necessary at this time.   4. Numbness and tingling in bilateral upper extremities :No complaints today. Continue current medication regimen. Continue to monitor. 08/27/2022  5.Chronic Pain Syndrome: Continue current medication regimen. Continue to monitor. 08/27/2022   6. Vascular headache: continue topamax  7. HTN: -BP is 174/92 today.  -discussed that her BP is typically well controlled at home and she was on lisinopril and Toprol in the past but she was weaned off of these -Advised checking BP daily at home and logging results to bring into follow-up appointment with PCP and myself. -Reviewed BP meds today.  -Advised regarding healthy foods that can help lower blood pressure and provided with a list: 1) citrus foods- high in vitamins and minerals 2) salmon and other fatty fish - reduces inflammation and oxylipins 3) swiss chard (leafy green)- high level of nitrates 4) pumpkin seeds- one of the best natural sources of magnesium 5) Beans and lentils- high in fiber, magnesium, and potassium 6) Berries- high in flavonoids 7) Amaranth (whole grain, can be cooked similarly to rice and oats)- high in magnesium and fiber 8) Pistachios- even more effective at  reducing BP than other nuts 9)  Carrots- high in phenolic compounds that relax blood vessels and reduce inflammation 10) Celery- contain phthalides that relax tissues of arterial walls 11) Tomatoes- can also improve cholesterol and reduce risk of heart disease 12) Broccoli- good source of magnesium, calcium, and potassium 13) Greek yogurt: high in potassium and calcium 14) Herbs and spices: Celery seed, cilantro, saffron, lemongrass, black cumin, ginseng, cinnamon, cardamom, sweet basil, and ginger 15) Chia and flax seeds- also help to lower cholesterol and blood sugar 16) Beets- high levels of nitrates that relax blood vessels  17) spinach and bananas- high in potassium  -Provided lise of supplements that can help with hypertension:  1) magnesium: one high quality brand is Bioptemizers since it contains all 7 types of magnesium, otherwise over the counter magnesium gluconate 400mg  is a good option 2) B vitamins 3) vitamin D 4) potassium 5) CoQ10 6) L-arginine 7) Vitamin C 8) Beetroot -Educated that goal BP is 120/80. -Made goal to incorporate some of the above foods into diet.    8. Vertigo -discussed relief with Bonine, discussed that this is an antihistamine and she can use it with the tizanidine -discussed that she tried rehab -discussed that the tubes in her ears could be contributing to her vertigo  9) Congestion: -continue Vicks vapor rub as needed -can consider bee pollen and local honey  >40 minutes spent in discussion of her pain, her medical history, the medications that provide her relief, that she was using a tens unit, she discussed that amitriptyline/flexeril/gabapentin seemed to help, discussed that she has tried tizanidine half a tablet 2mg , continue compression garments to help her walk, discussed her vertigo and that she had relief from [facility]

## 2023-05-03 ENCOUNTER — Encounter: Payer: 59 | Attending: Physical Medicine and Rehabilitation | Admitting: Physical Medicine and Rehabilitation

## 2023-05-03 ENCOUNTER — Encounter: Payer: Self-pay | Admitting: Physical Medicine and Rehabilitation

## 2023-05-03 VITALS — BP 172/92 | HR 91 | Ht 60.0 in | Wt 171.6 lb

## 2023-05-03 DIAGNOSIS — R0981 Nasal congestion: Secondary | ICD-10-CM | POA: Diagnosis not present

## 2023-05-03 DIAGNOSIS — M62838 Other muscle spasm: Secondary | ICD-10-CM | POA: Insufficient documentation

## 2023-05-03 DIAGNOSIS — H8110 Benign paroxysmal vertigo, unspecified ear: Secondary | ICD-10-CM | POA: Diagnosis not present

## 2023-05-03 DIAGNOSIS — G441 Vascular headache, not elsewhere classified: Secondary | ICD-10-CM | POA: Insufficient documentation

## 2023-05-03 DIAGNOSIS — G894 Chronic pain syndrome: Secondary | ICD-10-CM | POA: Insufficient documentation

## 2023-05-03 MED ORDER — TIZANIDINE HCL 2 MG PO TABS: 2.0000 mg | ORAL_TABLET | Freq: Every evening | ORAL | 3 refills | Status: DC

## 2023-05-03 NOTE — Patient Instructions (Signed)
HTN: -BP is 174/92 today.  -Advised checking BP daily at home and logging results to bring into follow-up appointment with PCP and myself. -Reviewed BP meds today.  -Advised regarding healthy foods that can help lower blood pressure and provided with a list: 1) citrus foods- high in vitamins and minerals 2) salmon and other fatty fish - reduces inflammation and oxylipins 3) swiss chard (leafy green)- high level of nitrates 4) pumpkin seeds- one of the best natural sources of magnesium 5) Beans and lentils- high in fiber, magnesium, and potassium 6) Berries- high in flavonoids 7) Amaranth (whole grain, can be cooked similarly to rice and oats)- high in magnesium and fiber 8) Pistachios- even more effective at reducing BP than other nuts 9) Carrots- high in phenolic compounds that relax blood vessels and reduce inflammation 10) Celery- contain phthalides that relax tissues of arterial walls 11) Tomatoes- can also improve cholesterol and reduce risk of heart disease 12) Broccoli- good source of magnesium, calcium, and potassium 13) Greek yogurt: high in potassium and calcium 14) Herbs and spices: Celery seed, cilantro, saffron, lemongrass, black cumin, ginseng, cinnamon, cardamom, sweet basil, and ginger 15) Chia and flax seeds- also help to lower cholesterol and blood sugar 16) Beets- high levels of nitrates that relax blood vessels  17) spinach and bananas- high in potassium  -Provided lise of supplements that can help with hypertension:  1) magnesium: one high quality brand is Bioptemizers since it contains all 7 types of magnesium, otherwise over the counter magnesium gluconate 400mg  is a good option 2) B vitamins 3) vitamin D 4) potassium 5) CoQ10 6) L-arginine 7) Vitamin C 8) Beetroot -Educated that goal BP is 120/80. -Made goal to incorporate some of the above foods into diet.     Foods that may reduce pain: 1) Ginger (especially studied for arthritis)- reduce leukotriene  production to decrease inflammation 2) Blueberries- high in phytonutrients that decrease inflammation 3) Salmon- marine omega-3s reduce joint swelling and pain 4) Pumpkin seeds- reduce inflammation 5) dark chocolate- reduces inflammation 6) turmeric- reduces inflammation 7) tart cherries - reduce pain and stiffness 8) extra virgin olive oil - its compound olecanthal helps to block prostaglandins  9) chili peppers- can be eaten or applied topically via capsaicin 10) mint- helpful for headache, muscle aches, joint pain, and itching 11) garlic- reduces inflammation  Link to further information on diet for chronic pain: [email or web address].[email or web address]

## 2023-05-14 ENCOUNTER — Other Ambulatory Visit: Payer: Self-pay | Admitting: Registered Nurse

## 2023-05-14 NOTE — Telephone Encounter (Signed)
Per last ov topiramate was documented @ 25 mg bid.

## 2023-08-02 ENCOUNTER — Encounter: Payer: 59 | Attending: Physical Medicine and Rehabilitation | Admitting: Physical Medicine and Rehabilitation

## 2023-08-02 VITALS — BP 156/84 | HR 98 | Ht 60.0 in | Wt 168.0 lb

## 2023-08-02 DIAGNOSIS — H8110 Benign paroxysmal vertigo, unspecified ear: Secondary | ICD-10-CM

## 2023-08-02 DIAGNOSIS — M5416 Radiculopathy, lumbar region: Secondary | ICD-10-CM | POA: Diagnosis not present

## 2023-08-02 DIAGNOSIS — M792 Neuralgia and neuritis, unspecified: Secondary | ICD-10-CM | POA: Diagnosis not present

## 2023-08-02 DIAGNOSIS — M62838 Other muscle spasm: Secondary | ICD-10-CM | POA: Diagnosis not present

## 2023-08-02 NOTE — Progress Notes (Signed)
Subjective:    Patient ID: Amy Bruce, female    DOB: 11-21-1950, 72 y.o.   MRN: [id number]  HPI:  1) Right arm muscle spasms: -tyring to wean herself off her medications -[employer] contacted her about tens unit but she was skeptical of them, she went online and couldn't find that brand -she is doing better -she does not have the pain any more but does have the tingling and pins and needles -she still has the muscle spasms -exercises have really helped -still has neuropathy throughtout the arm and hand -muscle spasms and "goes crazy" -she could be sitting doing nothing and it starts to spasm -her neurosurgeon Dr. Conchita Paris told her when he released her that she is going to be dealing with muscle spasms and headaches forever.   2) vascular headache: -has regular headaches  3) Neuropathy -extends down right arm   Amy Bruce is a 72 y.o. female who returns for follow up appointment for chronic pain and medication refill. She states her pain is located in her neck radiating into her right shoulder, right arm and tingling and burning into her right hand. She rates her pain 10. Her current exercise regime is walking with cane.  Amy Bruce seen Dr. Conchita Paris on 07/02/2022, she presented to Dr Conchita Paris with complaints of right sided neck and arm pain. Dr Conchita Paris recommended MRI of Cervical Spine without contrast, Amy Bruce stated she was unable to have MRI without anesthesia, due to her having a panic attack, when she tried in the past. Dr. Conchita Paris doesn't order MRI with sedation. In the past Dr Allena Katz ordered Cervical MRI WO Contrast with sedation ,on  03/02/2021. Dr Allena Katz is no longer a provider at our office, I spoke with Dr Marijean Niemann regarding the above, she agrees with ordering the Cervical MRI WO Contrast with sedation. Order was placed.   Dr Conchita Paris will review the MRI, and will discuss any further treatment options from a neurosurgical standpoint.      Pain  Inventory Average Pain 8 Pain Right Now 8 My pain is constant, burning, tingling, and aching  In the last 24 hours, has pain interfered with the following? General activity 7 Relation with others 7 Enjoyment of life 7 What TIME of day is your pain at its worst? morning , daytime, evening, and night Sleep (in general) Good  Pain is worse with: walking, bending, sitting, inactivity, standing, and some activites Pain improves with: rest and TENS Relief from Meds: 8  Family History  Problem Relation Age of Onset   Ovarian cancer Mother    Social History   Socioeconomic History   Marital status: Divorced    Spouse name: Not on file   Number of children: Not on file   Years of education: Not on file   Highest education level: Not on file  Occupational History   Not on file  Tobacco Use   Smoking status: Former    Current packs/day: 0.00    Average packs/day: 1 pack/day for 33.0 years (33.0 ttl pk-yrs)    Types: Cigarettes    Start date: 08/1987    Quit date: 08/2020    Years since quitting: 2.9   Smokeless tobacco: Never   Tobacco comments:    2 ciggs a day  Vaping Use   Vaping status: Never Used  Substance and Sexual Activity   Alcohol use: Not Currently    Alcohol/week: 2.0 standard drinks of alcohol    Types: 2 Shots of liquor per week  Comment: "not since 2021" - 03/01/21   Drug use: No   Sexual activity: Not on file  Other Topics Concern   Not on file  Social History Narrative   Not on file   Social Determinants of Health   Financial Resource Strain: Not on file  Food Insecurity: Not on file  Transportation Needs: Not on file  Physical Activity: Not on file  Stress: Not on file  Social Connections: Unknown (01/23/2022)   Received from [facility]   Social Network    Social Network: Not on file   Past Surgical History:  Procedure Laterality Date   ABDOMINAL AORTOGRAM W/LOWER EXTREMITY N/A 04/02/2018   Procedure: ABDOMINAL AORTOGRAM  W/LOWER EXTREMITY;  Surgeon: Iran Ouch, MD;  [facility]: [facility];  Service: Cardiovascular;  Laterality: N/A;   ABDOMINAL AORTOGRAM W/LOWER EXTREMITY N/A 09/23/2019   Procedure: ABDOMINAL AORTOGRAM W/LOWER EXTREMITY;  Surgeon: Iran Ouch, MD;  Location: [facility];  Service: Cardiovascular;  Laterality: N/A;   ABDOMINAL HYSTERECTOMY  1999   CERVICAL SPINE SURGERY  [id number]   C5/6 6/7   COLONOSCOPY     CRANIOTOMY Right 08/23/2020   Procedure: RIGHT CRANIOTOMY HEMATOMA EVACUATION SUBDURAL;  Surgeon: Lisbeth Renshaw, MD;  Location: [facility];  Service: Neurosurgery;  Laterality: Right;   ECTOPIC PREGNANCY SURGERY     Left axillary cyst removal 1989     MULTIPLE EXTRACTIONS WITH ALVEOLOPLASTY N/A 10/25/2017   Procedure: EXTRACTION NUMBERS FIVE, SEVEN, EIGHT, NINE, TEN, FIFTEEN, EIGHTEEN WITH ALVEOLOPLASTY, IRRIGATION AND DEBRIDEMENT LEFT SUBMANDIBULAR INFECTION;  Surgeon: Ocie Doyne, DDS;  Location: [facility];  Service: Oral Surgery;  Laterality: N/A;   PERIPHERAL VASCULAR ATHERECTOMY Right 09/23/2019   Procedure: PERIPHERAL VASCULAR ATHERECTOMY;  Surgeon: Iran Ouch, MD;  [facility]: [facility];  Service: Cardiovascular;  Laterality: Right;  SFA   PERIPHERAL VASCULAR BALLOON ANGIOPLASTY Right 09/23/2019   Procedure: PERIPHERAL VASCULAR BALLOON ANGIOPLASTY;  Surgeon: Iran Ouch, MD;  [facility];  Service: Cardiovascular;  Laterality: Right;  SFA   RADIOLOGY WITH ANESTHESIA N/A 11/18/2018   Procedure: MRI OF THE CERVICAL SPINE WITHOUT CONTRAST;  Surgeon: Radiologist, Medication, MD;  Location: [facility];  Service: Radiology;  Laterality: N/A;   RADIOLOGY WITH ANESTHESIA N/A 12/03/2019   Procedure: MRI WITH ANESTHESIA CERVICAL WITH AND WITHOUT CONTRAST;  Surgeon: Radiologist, Medication, MD;  [facility]: [facility];  Service: Radiology;  Laterality: N/A;   RADIOLOGY WITH ANESTHESIA N/A 03/02/2021   Procedure: MRI CERVICAL SPINE WITH AND WITHOUT  CONTRAST;  Surgeon: Radiologist, Medication, MD;  [facility]: [facility];  Service: Radiology;  Laterality: N/A;   RADIOLOGY WITH ANESTHESIA N/A 09/25/2022   Procedure: MRI WITH ANESTHESIA CERVICAL SPINE WITHOUT CONTRAST;  Surgeon: Radiologist, Medication, MD;  [facility]: [facility];  Service: Radiology;  Laterality: N/A;   TONSILECTOMY, ADENOIDECTOMY, BILATERAL MYRINGOTOMY AND TUBES  1981   put not aware of tubes being put in ears   TUBAL LIGATION  1981   Past Surgical History:  Procedure Laterality Date   ABDOMINAL AORTOGRAM W/LOWER EXTREMITY N/A 04/02/2018   Procedure: ABDOMINAL AORTOGRAM W/LOWER EXTREMITY;  Surgeon: Iran Ouch, MD;  [facility];  Service: Cardiovascular;  Laterality: N/A;   ABDOMINAL AORTOGRAM W/LOWER EXTREMITY N/A 09/23/2019   Procedure: ABDOMINAL AORTOGRAM W/LOWER EXTREMITY;  Surgeon: Iran Ouch, MD;  [facility];  Service: Cardiovascular;  Laterality: N/A;   ABDOMINAL HYSTERECTOMY  1999   CERVICAL SPINE SURGERY  [phone]   C5/6  6/7   COLONOSCOPY     CRANIOTOMY Right 08/23/2020   Procedure: RIGHT CRANIOTOMY HEMATOMA EVACUATION SUBDURAL;  Surgeon: Lisbeth Renshaw, MD;  [facility];  Service: Neurosurgery;  Laterality: Right;   ECTOPIC PREGNANCY SURGERY     Left axillary cyst removal 1989     MULTIPLE EXTRACTIONS WITH ALVEOLOPLASTY N/A 10/25/2017   Procedure: EXTRACTION NUMBERS FIVE, SEVEN, EIGHT, NINE, TEN, FIFTEEN, EIGHTEEN WITH ALVEOLOPLASTY, IRRIGATION AND DEBRIDEMENT LEFT SUBMANDIBULAR INFECTION;  Surgeon: Ocie Doyne, DDS;  [facility]: [facility];  Service: Oral Surgery;  Laterality: N/A;   PERIPHERAL VASCULAR ATHERECTOMY Right 09/23/2019   Procedure: PERIPHERAL VASCULAR ATHERECTOMY;  Surgeon: Iran Ouch, MD;  [facility];  Service: Cardiovascular;  Laterality: Right;  SFA   PERIPHERAL VASCULAR BALLOON ANGIOPLASTY Right 09/23/2019   Procedure: PERIPHERAL VASCULAR BALLOON ANGIOPLASTY;  Surgeon: Iran Ouch, MD;  [facility];  Service: Cardiovascular;  Laterality: Right;  SFA   RADIOLOGY WITH ANESTHESIA N/A 11/18/2018   Procedure: MRI OF THE CERVICAL SPINE WITHOUT CONTRAST;  Surgeon: Radiologist, Medication, MD;  [facility];  Service: Radiology;  Laterality: N/A;   RADIOLOGY WITH ANESTHESIA N/A 12/03/2019   Procedure: MRI WITH ANESTHESIA CERVICAL WITH AND WITHOUT CONTRAST;  Surgeon: Radiologist, Medication, MD;  [facility];  Service: Radiology;  Laterality: N/A;   RADIOLOGY WITH ANESTHESIA N/A 03/02/2021   Procedure: MRI CERVICAL SPINE WITH AND WITHOUT CONTRAST;  Surgeon: Radiologist, Medication, MD;  [facility];  Service: Radiology;  Laterality: N/A;   RADIOLOGY WITH ANESTHESIA N/A 09/25/2022   Procedure: MRI WITH ANESTHESIA CERVICAL SPINE WITHOUT CONTRAST;  Surgeon: Radiologist, Medication, MD;  [facility]: [facility];  Service: Radiology;  Laterality: N/A;   TONSILECTOMY, ADENOIDECTOMY, BILATERAL MYRINGOTOMY AND TUBES  1981   put not aware of tubes being put in ears   TUBAL LIGATION  1981   Past Medical History:  Diagnosis Date   Anemia    in the past   Anxiety    yrs. 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 Cardiologist is aware.   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, has to have chest xray   Vertigo    since head injury, have to  be careful when walking   BP (!) 156/84   Pulse 98   Ht 5' (1.524 m)   Wt 168 lb (76.2 kg)   SpO2 96%   BMI 32.81 kg/m   Opioid Risk Score:   Fall Risk Score:  `1  Depression screen [facility] 2/9     08/27/2022    8:29 AM 06/18/2022    1:38 PM 05/25/2021    8:31 AM 02/22/2021  8:26 AM 01/02/2021    1:35 PM 04/14/2020   10:18 AM 11/20/2018   10:53 AM  Depression screen PHQ 2/9  Decreased Interest 0 0 0 0 0 0 0  Down, Depressed, Hopeless 0 0 0 0 0 0 0  PHQ - 2 Score 0 0 0 0 0 0 0    Review of Systems  Constitutional: Negative.   HENT: Negative.    Eyes: Negative.   Respiratory: Negative.    Cardiovascular: Negative.   Gastrointestinal: Negative.   Endocrine: Negative.   Genitourinary: Negative.   Musculoskeletal:  Positive for gait problem and neck pain.       Pain in the right shoulder & right arm, left torso, left arm & left shoulder  Skin: Negative.   Allergic/Immunologic: Negative.   Neurological:  Positive for headaches.  Hematological:  Bruises/bleeds easily.       Plavix  Psychiatric/Behavioral: Negative.    All other systems reviewed and are negative.      Objective:     Gen: no distress, normal appearing HEENT: oral mucosa pink and moist, NCAT Cardio: Reg rate Chest: normal effort, normal rate of breathing Abd: soft, non-distended Ext: no edema Psych: pleasant, normal affect Skin: intact Neuro: Alert and oriented x3 Musculoskeletal: Has full range of motion of right shoulder, 5/5 strength in bilateral hand grip     Assessment & Plan:  Chronic pain 2/2 to Cervicalgia/ Cervical Radiculitis: She is scheduled for Cervical MRI WO Contrast: Dr Conchita Paris Following.  Continue HEP as Tolerated. Continue to Monitor. Neurosurgery Following. 08/27/2022 -discussed that medications help her -continue tens unit  2. Lumbar Radiculitis: continue Topamax 50 mg one tablet twice a day . Continue HEP as tolerated. Continue to monitor. Neurosurgery Following.  08/27/2022  3.Muscle Spasm: Continue current medication regimen with Cyclobenzaprine  Continue to monitor. 08/27/2022 -discussed that she has tried tizanidine and willing to try again  -d/c tizandine  Prescribed Zynex Nexwave  heating/cooling blanket   Prescribing Home Z[employer]nex NexWave Stimulator Device and supplies as needed. IFC, NMES and TENS medically necessary Treatment Rx: Daily @ 30-40 minutes per treatment PRN. [employer]nex NexWave only, no substitutions. Treatment Goals: 1) To reduce and/or eliminate pain 2) To improve functional capacity and Activities of daily living 3) To reduce or prevent the need for oral medications 4) To improve circulation in the injured region 5) To decrease or prevent muscle spasm and muscle atrophy 6) To provide a self-management tool to the patient The patient has not sufficiently improved with conservative care. Numerous studies indexed by Medline and PubMed.gov have shown Neuromuscular, Interferential, and TENS stimulators to reduce pain, improve function, and reduce medication use in injured patients. Continued use of this evidence based, safe, drug free treatment is both reasonable and medically necessary at this time.   4. Numbness and tingling in bilateral upper extremities :No complaints today. Continue current medication regimen. Continue to monitor. 08/27/2022 -Discussed Qutenza as an option for neuropathic pain control. Discussed that this is a capsaicin patch, stronger than capsaicin cream. Discussed that it is currently approved for diabetic peripheral neuropathy and post-herpetic neuralgia, but that it has also shown benefit in treating other forms of neuropathy. Provided patient with link to site to learn more about the patch: https://www.clark.biz/. Discussed that the patch would be placed in office and benefits usually last 3 months. Discussed that unintended exposure to capsaicin can cause severe irritation of eyes, mucous membranes, respiratory tract, and  skin, but that Qutenza is a local treatment and does not  have the systemic side effects of other nerve medications. Discussed that there may be pain, itching, erythema, and decreased sensory function associated with the application of Qutenza. Side effects usually subside within 1 week. A cold pack of analgesic medications can help with these side effects. Blood pressure can also be increased due to pain associated with administration of the patch.   5.Chronic Pain Syndrome: Continue current medication regimen. Continue to monitor. 08/27/2022   6. Vascular headache: continue topamax  7. HTN: -BP is 174/92 today.  -discussed that her BP is typically well controlled at home and she was on lisinopril and Toprol in the past but she was weaned off of these -Advised checking BP daily at home and logging results to bring into follow-up appointment with PCP and myself. -Reviewed BP meds today.  -Advised regarding healthy foods that can help lower blood pressure and provided with a list: 1) citrus foods- high in vitamins and minerals 2) salmon and other fatty fish - reduces inflammation and oxylipins 3) swiss chard (leafy green)- high level of nitrates 4) pumpkin seeds- one of the best natural sources of magnesium 5) Beans and lentils- high in fiber, magnesium, and potassium 6) Berries- high in flavonoids 7) Amaranth (whole grain, can be cooked similarly to rice and oats)- high in magnesium and fiber 8) Pistachios- even more effective at reducing BP than other nuts 9) Carrots- high in phenolic compounds that relax blood vessels and reduce inflammation 10) Celery- contain phthalides that relax tissues of arterial walls 11) Tomatoes- can also improve cholesterol and reduce risk of heart disease 12) Broccoli- good source of magnesium, calcium, and potassium 13) Greek yogurt: high in potassium and calcium 14) Herbs and spices: Celery seed, cilantro, saffron, lemongrass, black cumin, ginseng, cinnamon,  cardamom, sweet basil, and ginger 15) Chia and flax seeds- also help to lower cholesterol and blood sugar 16) Beets- high levels of nitrates that relax blood vessels  17) spinach and bananas- high in potassium  -Provided lise of supplements that can help with hypertension:  1) magnesium: one high quality brand is Bioptemizers since it contains all 7 types of magnesium, otherwise over the counter magnesium gluconate 400mg  is a good option 2) B vitamins 3) vitamin D 4) potassium 5) CoQ10 6) L-arginine 7) Vitamin C 8) Beetroot -Educated that goal BP is 120/80. -Made goal to incorporate some of the above foods into diet.    8. Vertigo -discussed relief with Bonine, discussed that this is an antihistamine and she can use it with the tizanidine -discussed that she tried rehab -discussed that the tubes in her ears could be contributing to her vertigo -discussed that she has not seen ENT yet -recommended gluten removal   9) Congestion: -continue Vicks vapor rub as needed -can consider bee pollen and local honey

## 2023-08-26 ENCOUNTER — Other Ambulatory Visit: Payer: Self-pay

## 2023-08-27 ENCOUNTER — Other Ambulatory Visit: Payer: Self-pay | Admitting: Physical Medicine and Rehabilitation

## 2023-08-30 ENCOUNTER — Other Ambulatory Visit: Payer: Self-pay | Admitting: Acute Care

## 2023-09-18 ENCOUNTER — Encounter: Payer: Self-pay | Admitting: Acute Care

## 2023-12-06 ENCOUNTER — Ambulatory Visit (HOSPITAL_COMMUNITY)
Admission: RE | Admit: 2023-12-06 | Discharge: 2023-12-06 | Disposition: A | Discharge status: Home / Self Care | Source: Ambulatory Visit | Attending: Cardiovascular Disease | Admitting: Cardiovascular Disease

## 2023-12-06 ENCOUNTER — Ambulatory Visit (HOSPITAL_BASED_OUTPATIENT_CLINIC_OR_DEPARTMENT_OTHER)
Admission: RE | Admit: 2023-12-06 | Discharge: 2023-12-06 | Disposition: A | Discharge status: Home / Self Care | Source: Ambulatory Visit | Attending: Cardiovascular Disease | Admitting: Cardiovascular Disease

## 2023-12-06 DIAGNOSIS — I739 Peripheral vascular disease, unspecified: Secondary | ICD-10-CM | POA: Diagnosis not present

## 2023-12-06 LAB — VAS US ABI WITH/WO TBI
Left ABI: 1.09
Right ABI: 0.93

## 2023-12-10 ENCOUNTER — Ambulatory Visit: Attending: Cardiovascular Disease | Admitting: Cardiovascular Disease

## 2023-12-11 ENCOUNTER — Other Ambulatory Visit: Payer: Self-pay | Admitting: Physical Medicine and Rehabilitation

## 2023-12-12 ENCOUNTER — Other Ambulatory Visit (HOSPITAL_COMMUNITY): Payer: Self-pay | Admitting: *Deleted

## 2023-12-12 ENCOUNTER — Encounter: Payer: Self-pay | Admitting: Cardiovascular Disease

## 2023-12-12 DIAGNOSIS — I739 Peripheral vascular disease, unspecified: Secondary | ICD-10-CM

## 2023-12-13 ENCOUNTER — Encounter: Payer: Self-pay | Admitting: *Deleted

## 2023-12-24 DIAGNOSIS — Z1211 Encounter for screening for malignant neoplasm of colon: Secondary | ICD-10-CM | POA: Diagnosis not present

## 2023-12-24 DIAGNOSIS — Z0000 Encounter for general adult medical examination without abnormal findings: Secondary | ICD-10-CM | POA: Diagnosis not present

## 2023-12-24 DIAGNOSIS — R7303 Prediabetes: Secondary | ICD-10-CM | POA: Diagnosis not present

## 2023-12-24 DIAGNOSIS — H6120 Impacted cerumen, unspecified ear: Secondary | ICD-10-CM | POA: Diagnosis not present

## 2023-12-24 DIAGNOSIS — I739 Peripheral vascular disease, unspecified: Secondary | ICD-10-CM | POA: Diagnosis not present

## 2023-12-24 DIAGNOSIS — G8929 Other chronic pain: Secondary | ICD-10-CM | POA: Diagnosis not present

## 2023-12-24 DIAGNOSIS — E785 Hyperlipidemia, unspecified: Secondary | ICD-10-CM | POA: Diagnosis not present

## 2023-12-24 DIAGNOSIS — R5383 Other fatigue: Secondary | ICD-10-CM | POA: Diagnosis not present

## 2023-12-24 DIAGNOSIS — J439 Emphysema, unspecified: Secondary | ICD-10-CM | POA: Diagnosis not present

## 2023-12-24 DIAGNOSIS — I10 Essential (primary) hypertension: Secondary | ICD-10-CM | POA: Diagnosis not present

## 2023-12-24 DIAGNOSIS — R42 Dizziness and giddiness: Secondary | ICD-10-CM | POA: Diagnosis not present

## 2023-12-24 DIAGNOSIS — D649 Anemia, unspecified: Secondary | ICD-10-CM | POA: Diagnosis not present

## 2024-01-04 ENCOUNTER — Other Ambulatory Visit: Payer: Self-pay | Admitting: Cardiovascular Disease

## 2024-01-14 ENCOUNTER — Encounter: Payer: Self-pay | Admitting: Cardiovascular Disease

## 2024-01-14 ENCOUNTER — Ambulatory Visit: Attending: Cardiovascular Disease | Admitting: Cardiovascular Disease

## 2024-01-14 VITALS — BP 150/82 | HR 89 | Ht 60.0 in | Wt 156.2 lb

## 2024-01-14 DIAGNOSIS — E785 Hyperlipidemia, unspecified: Secondary | ICD-10-CM

## 2024-01-14 DIAGNOSIS — I872 Venous insufficiency (chronic) (peripheral): Secondary | ICD-10-CM | POA: Diagnosis not present

## 2024-01-14 DIAGNOSIS — I739 Peripheral vascular disease, unspecified: Secondary | ICD-10-CM | POA: Diagnosis not present

## 2024-01-14 DIAGNOSIS — I10 Essential (primary) hypertension: Secondary | ICD-10-CM | POA: Diagnosis not present

## 2024-01-14 NOTE — Patient Instructions (Signed)
 Medication Instructions:  Your physician recommends that you continue on your current medications as directed. Please refer to the Current Medication list given to you today.  *If you need a refill on your cardiac medications before your next appointment, please call your pharmacy*  Lab Work: NONE ordered at this time of appointment    Testing/Procedures: NONE ordered at this time of appointment    Follow-Up: At [facility], you and your health needs are our priority.  As part of our continuing mission to provide you with exceptional heart care, our providers are all part of one team.  This team includes your primary Cardiologist (physician) and Advanced Practice Providers or APPs (Physician Assistants and Nurse Practitioners) who all work together to provide you with the care you need, when you need it.  Your next appointment:   1 year(s)  Provider:   Dr. Alvenia Aus    We recommend signing up for the patient portal called "MyChart".  Sign up information is provided on this After Visit Summary.  MyChart is used to connect with patients for Virtual Visits (Telemedicine).  Patients are able to view lab/test results, encounter notes, upcoming appointments, etc.  Non-urgent messages can be sent to your provider as well.   To learn more about what you can do with MyChart, go to [email or web address].

## 2024-01-14 NOTE — Progress Notes (Signed)
 Cardiology Office Note   Date:  01/14/2024   ID:  Amy, Bruce 19-Jun-1951, MRN [id number]  PCP:  Ronna Coho, MD  Cardiologist:   Antionette Kirks, MD   No chief complaint on file.     History of Present Illness: Amy Bruce is a 73 y.o. female who is here today for a follow-up visit regarding peripheral arterial disease.  She has known history of peripheral arterial disease, tobacco use, essential hypertension and hyperlipidemia.  Previous CT imaging showed evidence of aortic and coronary calcifications.  She has prolonged history of plantar fasciitis. She has known history of peripheral arterial disease with claudication. Angiography in July 2019 showed diffuse right SFA disease with medium length occlusion in the mid to distal segment.  On the left there was moderate ostial left SFA stenosis with two-vessel runoff below the knee.   Lexiscan  Myoview  in September 2019 showed no evidence of ischemia with normal ejection fraction. She had back surgery done in 2022.   She had worsening claudication in 2021.  Angiography was done in January 2021 which showed no significant change from previous angiography.  I performed successful orbital atherectomy to the proximal and mid right SFA followed by drug-coated balloon angioplasty.  The distal SFA was treated with drug-eluting stent given that the reentry was subintimal.  Postprocedure ABI improved to normal with mildly decreased toe pressure.  She quit smoking in December, 2022.    She was seen in 2024 for intermittent palpitations and tachycardia with increased shortness of breath and occasional symptoms of chest pain.  She underwent a Lexiscan  Myoview  which showed no evidence of ischemia with normal ejection fraction.  An outpatient monitor was done and showed short runs of SVT.  I prescribed Toprol  25 mg once daily but her symptoms improved and she did not have to take the medication.   Most recent vascular Doppler studies in  March showed an ABI of 0.93 on the right and 1.09 on the left.  Duplex showed patent right SFA stent with velocity of 240 at the distal segment.  She has been doing well overall with no recurrent Claudication.  She is bothered by bilateral numbness and burning sensation and she is concerned it might be related to varicose veins.  No chest pain or shortness of breath.  Her blood pressure is elevated today.  She reports normal readings at home.  She used to be on lisinopril  in the past.   Past Medical History:  Diagnosis Date   Anemia    in the past   Anxiety    yrs. ago panic attacks   Arthritis    Cancer (HCC)    small in ovary - had total hysterestomy   Family history of adverse reaction to anesthesia    Fibromyalgia    GERD (gastroesophageal reflux disease)    GI bleed    Head injury 08/23/2020   Heart murmur    due to rheumatic fever, PCP no longer hears it   History of blood transfusion    GI Bleed   History of hiatal hernia    Hyperlipidemia    Hypertension    Myalgia    Neuropathy    PAD (peripheral artery disease) (HCC)    PAD (peripheral artery disease) (HCC)    Pre-diabetes    Pyelonephritis 09/10/2019    "septic"   SVT (supraventricular tachycardia) (HCC)    has for seconds, it does not matter what she is doing. Cardiologist is aware.  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, has to have chest xray   Vertigo    since head injury, have to be careful when walking    Past Surgical History:  Procedure Laterality Date   ABDOMINAL AORTOGRAM W/LOWER EXTREMITY N/A 04/02/2018   Procedure: ABDOMINAL AORTOGRAM W/LOWER EXTREMITY;  Surgeon: Wenona Hamilton, MD;  Location: [facility];  Service: Cardiovascular;  Laterality: N/A;   ABDOMINAL AORTOGRAM W/LOWER EXTREMITY N/A 09/23/2019   Procedure: ABDOMINAL AORTOGRAM W/LOWER EXTREMITY;  Surgeon: Wenona Hamilton, MD;  Location: [facility];  Service: Cardiovascular;  Laterality: N/A;   ABDOMINAL HYSTERECTOMY   1999   CERVICAL SPINE SURGERY  0981,1914   C5/6 6/7   COLONOSCOPY     CRANIOTOMY Right 08/23/2020   Procedure: RIGHT CRANIOTOMY HEMATOMA EVACUATION SUBDURAL;  Surgeon: Augusto Blonder, MD;  Location: [facility];  Service: Neurosurgery;  Laterality: Right;   ECTOPIC PREGNANCY SURGERY     Left axillary cyst removal 1989     MULTIPLE EXTRACTIONS WITH ALVEOLOPLASTY N/A 10/25/2017   Procedure: EXTRACTION NUMBERS FIVE, SEVEN, EIGHT, NINE, TEN, FIFTEEN, EIGHTEEN WITH ALVEOLOPLASTY, IRRIGATION AND DEBRIDEMENT LEFT SUBMANDIBULAR INFECTION;  Surgeon: Ascencion Lava, DDS;  Location: [facility];  Service: Oral Surgery;  Laterality: N/A;   PERIPHERAL VASCULAR ATHERECTOMY Right 09/23/2019   Procedure: PERIPHERAL VASCULAR ATHERECTOMY;  Surgeon: Wenona Hamilton, MD;  Location: [facility];  Service: Cardiovascular;  Laterality: Right;  SFA   PERIPHERAL VASCULAR BALLOON ANGIOPLASTY Right 09/23/2019   Procedure: PERIPHERAL VASCULAR BALLOON ANGIOPLASTY;  Surgeon: Wenona Hamilton, MD;  Location: [facility];  Service: Cardiovascular;  Laterality: Right;  SFA   RADIOLOGY WITH ANESTHESIA N/A 11/18/2018   Procedure: MRI OF THE CERVICAL SPINE WITHOUT CONTRAST;  Surgeon: Radiologist, Medication, MD;  Location: [facility];  Service: Radiology;  Laterality: N/A;   RADIOLOGY WITH ANESTHESIA N/A 12/03/2019   Procedure: MRI WITH ANESTHESIA CERVICAL WITH AND WITHOUT CONTRAST;  Surgeon: Radiologist, Medication, MD;  Location: [facility];  Service: Radiology;  Laterality: N/A;   RADIOLOGY WITH ANESTHESIA N/A 03/02/2021   Procedure: MRI CERVICAL SPINE WITH AND WITHOUT CONTRAST;  Surgeon: Radiologist, Medication, MD;  Location: [facility];  Service: Radiology;  Laterality: N/A;   RADIOLOGY WITH ANESTHESIA N/A 09/25/2022   Procedure: MRI WITH ANESTHESIA CERVICAL SPINE WITHOUT CONTRAST;  Surgeon: Radiologist, Medication, MD;  Location: [facility];  Service: Radiology;  Laterality: N/A;   TONSILECTOMY, ADENOIDECTOMY, BILATERAL MYRINGOTOMY AND  TUBES  1981   put not aware of tubes being put in ears   TUBAL LIGATION  1981     Current Outpatient Medications  Medication Sig Dispense Refill   albuterol  (VENTOLIN  HFA) 108 (90 Base) MCG/ACT inhaler INHALE 1 TO 2 PUFFS INTO THE LUNGS EVERY 4 HOURS AS NEEDED FOR WHEEZING 8.5 g 3   atorvastatin  (LIPITOR ) 80 MG tablet TAKE 1 TABLET(80 MG) BY MOUTH DAILY 90 tablet 3   Carbamide Peroxide (EAR WAX REMOVAL KIT OT) Place 5-10 drops into both ears as needed (ear wax buildup).     clopidogrel  (PLAVIX ) 75 MG tablet TAKE 1 TABLET(75 MG) BY MOUTH DAILY 90 tablet 3   cyclobenzaprine  (FLEXERIL ) 10 MG tablet Take 10 mg by mouth in the morning and at bedtime.     fexofenadine (ALLEGRA) 180 MG tablet Take 180 mg by mouth in the morning.     fluticasone (FLONASE) 50 MCG/ACT nasal spray Place 2 sprays into both nostrils daily as needed for allergies or rhinitis.  Omega-3 Fatty Acids (FISH OIL) 1000 MG CAPS Take 1,000 mg by mouth in the morning.     topiramate  (TOPAMAX ) 50 MG tablet TAKE 1 TABLET(50 MG) BY MOUTH TWICE DAILY 60 tablet 2   vitamin B-12 (CYANOCOBALAMIN ) 100 MCG tablet Take 100 mcg by mouth daily.     Vitamin D, Ergocalciferol, (DRISDOL) 1.25 MG (50000 UNIT) CAPS capsule Take 50,000 Units by mouth once a week.     No current facility-administered medications for this visit.    Allergies:   Carisoprodol and Duloxetine  hcl    Social History:  The patient  reports that she quit smoking about 3 years ago. Her smoking use included cigarettes. She started smoking about 36 years ago. She has a 33 pack-year smoking history. She has never used smokeless tobacco. She reports that she does not currently use alcohol after a past usage of about 2.0 standard drinks of alcohol per week. She reports that she does not use drugs.   Family History:  The patient's family history includes Ovarian cancer in her mother.    ROS:  Please see the history of present illness.   Otherwise, review of systems are  positive for none.   All other systems are reviewed and negative.    PHYSICAL EXAM: VS:  BP (!) 150/82 (BP Location: Left Arm, Patient Position: Sitting)   Pulse 89   Ht 5' (1.524 m)   Wt 156 lb 3.2 oz (70.9 kg)   SpO2 95%   BMI 30.51 kg/m  , BMI Body mass index is 30.51 kg/m. GEN: Well nourished, well developed, in no acute distress  HEENT: normal  Neck: no JVD, carotid bruits, or masses Cardiac: RRR; no murmurs, rubs, or gallops Respiratory:  clear to auscultation bilaterally, normal work of breathing GI: soft, nontender, nondistended, + BS MS: no deformity or atrophy  Skin: warm and dry, no rash Neuro:  Strength and sensation are intact Psych: euthymic mood, full affect    EKG:  EKG is ordered today. EKG showed: Normal sinus rhythm Normal ECG    Recent Labs: No results found for requested labs within last 365 days.    Lipid Panel No results found for: "CHOL", "TRIG", "HDL", "CHOLHDL", "VLDL", "LDLCALC", "LDLDIRECT"    Wt Readings from Last 3 Encounters:  01/14/24 156 lb 3.2 oz (70.9 kg)  08/02/23 168 lb (76.2 kg)  05/03/23 171 lb 9.6 oz (77.8 kg)           No data to display            ASSESSMENT AND PLAN:  1. Peripheral arterial disease: Status post right SFA endovascular intervention for severe right calf claudication.  No recurrent Claudication.  I discussed the results of recent Doppler studies with her.  ABIs close to normal on the right side.  There is borderline restenosis at the distal edge of the stent.  Will continue to monitor this with a yearly Doppler.  2.  Previous tobacco use: No relapse.  3.  Hyperlipidemia: Continue high-dose atorvastatin .  Recommended target LDL of less than 70 and preferably less than 55.  4.  Essential hypertension: Blood pressure is elevated today but she reports normal readings at home.  5.  Chronic venous insufficiency: She uses knee-high support stockings as needed.  She has small varicose veins which I do  not think are causing issues.  I suspect that her symptoms are related to neuropathy.  6.  Intermittent tachycardia: Outpatient monitor showed short runs of SVT.  Her symptoms  improved without interventions.    Disposition:   FU with me in 12 months.  Signed,  Antionette Kirks, MD  01/14/2024 11:37 AM    [facility]

## 2024-01-31 ENCOUNTER — Encounter: Payer: 59 | Attending: Physical Medicine and Rehabilitation | Admitting: Physical Medicine and Rehabilitation

## 2024-01-31 ENCOUNTER — Encounter: Payer: Self-pay | Admitting: Physical Medicine and Rehabilitation

## 2024-01-31 VITALS — BP 157/82 | HR 80 | Ht 60.0 in | Wt 159.0 lb

## 2024-01-31 DIAGNOSIS — Z8669 Personal history of other diseases of the nervous system and sense organs: Secondary | ICD-10-CM | POA: Insufficient documentation

## 2024-01-31 DIAGNOSIS — Z7409 Other reduced mobility: Secondary | ICD-10-CM | POA: Diagnosis not present

## 2024-01-31 DIAGNOSIS — M62838 Other muscle spasm: Secondary | ICD-10-CM | POA: Diagnosis not present

## 2024-01-31 DIAGNOSIS — M792 Neuralgia and neuritis, unspecified: Secondary | ICD-10-CM | POA: Diagnosis not present

## 2024-01-31 DIAGNOSIS — H8113 Benign paroxysmal vertigo, bilateral: Secondary | ICD-10-CM | POA: Insufficient documentation

## 2024-01-31 DIAGNOSIS — M542 Cervicalgia: Secondary | ICD-10-CM | POA: Diagnosis not present

## 2024-01-31 MED ORDER — TOPIRAMATE 100 MG PO TABS: 100.0000 mg | ORAL_TABLET | Freq: Two times a day (BID) | ORAL | 3 refills | Status: DC

## 2024-01-31 NOTE — Progress Notes (Signed)
 Subjective:    Patient ID: Amy Bruce, female    DOB: 06-29-1951, 73 y.o.   MRN: [id number]  HPI:  1) Right arm muscle spasms: -been worse -tyring to wean herself off her medications -[employer] contacted her about tens unit but she was skeptical of them, she went online and couldn't find that brand -she is doing better -she does not have the pain any more but does have the tingling and pins and needles -she still has the muscle spasms -exercises have really helped -still has neuropathy throughtout the arm and hand -muscle spasms and "goes crazy" -she could be sitting doing nothing and it starts to spasm -her neurosurgeon Dr. Nat Badger told her when he released her that she is going to be dealing with muscle spasms and headaches forever.   2) vascular headache: -has regular headaches  3) Neuropathy -extends down right arm and right leg -burns and hurts   Amy Bruce is a 73 y.o. female who returns for follow up appointment for chronic pain and medication refill. She states her pain is located in her neck radiating into her right shoulder, right arm and tingling and burning into her right hand. She rates her pain 10. Her current exercise regime is walking with cane.  Ms. Ascher seen Dr. Nat Badger on 07/02/2022, she presented to Dr Nat Badger with complaints of right sided neck and arm pain. Dr Nat Badger recommended MRI of Cervical Spine without contrast, Ms. Basurto stated she was unable to have MRI without anesthesia, due to her having a panic attack, when she tried in the past. Dr. Nat Badger doesn't order MRI with sedation. In the past Dr Lydia Sams ordered Cervical MRI WO Contrast with sedation ,on  03/02/2021. Dr Lydia Sams is no longer a provider at our office, I spoke with Dr Sylvie Every regarding the above, she agrees with ordering the Cervical MRI WO Contrast with sedation. Order was placed.   Dr Nat Badger will review the MRI, and will discuss any further treatment options from a  neurosurgical standpoint.      Pain Inventory Average Pain 8 Pain Right Now 8 My pain is constant, burning, and tingling  In the last 24 hours, has pain interfered with the following? General activity 8 Relation with others 8 Enjoyment of life 8 What TIME of day is your pain at its worst? morning , daytime, evening, and night Sleep (in general) Good  Pain is worse with: walking, sitting, inactivity, standing, and some activites Pain improves with: Nothing Relief from Meds: 0  Family History  Problem Relation Age of Onset   Ovarian cancer Mother    Social History   Socioeconomic History   Marital status: Divorced    Spouse name: Not on file   Number of children: Not on file   Years of education: Not on file   Highest education level: Not on file  Occupational History   Not on file  Tobacco Use   Smoking status: Former    Current packs/day: 0.00    Average packs/day: 1 pack/day for 33.0 years (33.0 ttl pk-yrs)    Types: Cigarettes    Start date: 08/1987    Quit date: 08/2020    Years since quitting: 3.4   Smokeless tobacco: Never   Tobacco comments:    2 ciggs a day  Vaping Use   Vaping status: Never Used  Substance and Sexual Activity   Alcohol use: Not Currently    Alcohol/week: 2.0 standard drinks of alcohol    Types: 2 Shots  of liquor per week    Comment: "not since 2021" - 03/01/21   Drug use: No   Sexual activity: Not on file  Other Topics Concern   Not on file  Social History Narrative   Not on file   Social Drivers of Health   Financial Resource Strain: Not on file  Food Insecurity: Not on file  Transportation Needs: Not on file  Physical Activity: Not on file  Stress: Not on file  Social Connections: Unknown (01/23/2022)   Received from [facility], [facility]   Social Network    Social Network: Not on file   Past Surgical History:  Procedure Laterality Date   ABDOMINAL AORTOGRAM W/LOWER EXTREMITY N/A 04/02/2018   Procedure: ABDOMINAL  AORTOGRAM W/LOWER EXTREMITY;  Surgeon: Wenona Hamilton, MD;  Location: [facility];  Service: Cardiovascular;  Laterality: N/A;   ABDOMINAL AORTOGRAM W/LOWER EXTREMITY N/A 09/23/2019   Procedure: ABDOMINAL AORTOGRAM W/LOWER EXTREMITY;  Surgeon: Wenona Hamilton, MD;  Location: [facility];  Service: Cardiovascular;  Laterality: N/A;   ABDOMINAL HYSTERECTOMY  1999   CERVICAL SPINE SURGERY  7829,5621   C5/6 6/7   COLONOSCOPY     CRANIOTOMY Right 08/23/2020   Procedure: RIGHT CRANIOTOMY HEMATOMA EVACUATION SUBDURAL;  Surgeon: Augusto Blonder, MD;  Location: [facility];  Service: Neurosurgery;  Laterality: Right;   ECTOPIC PREGNANCY SURGERY     Left axillary cyst removal 1989     MULTIPLE EXTRACTIONS WITH ALVEOLOPLASTY N/A 10/25/2017   Procedure: EXTRACTION NUMBERS FIVE, SEVEN, EIGHT, NINE, TEN, FIFTEEN, EIGHTEEN WITH ALVEOLOPLASTY, IRRIGATION AND DEBRIDEMENT LEFT SUBMANDIBULAR INFECTION;  Surgeon: Ascencion Lava, DDS;  Location: [facility];  Service: Oral Surgery;  Laterality: N/A;   PERIPHERAL VASCULAR ATHERECTOMY Right 09/23/2019   Procedure: PERIPHERAL VASCULAR ATHERECTOMY;  Surgeon: Wenona Hamilton, MD;  [facility]: [facility];  Service: Cardiovascular;  Laterality: Right;  SFA   PERIPHERAL VASCULAR BALLOON ANGIOPLASTY Right 09/23/2019   Procedure: PERIPHERAL VASCULAR BALLOON ANGIOPLASTY;  Surgeon: Wenona Hamilton, MD;  [facility]: [facility];  Service: Cardiovascular;  Laterality: Right;  SFA   RADIOLOGY WITH ANESTHESIA N/A 11/18/2018   Procedure: MRI OF THE CERVICAL SPINE WITHOUT CONTRAST;  Surgeon: Radiologist, Medication, MD;  Location: [facility];  Service: Radiology;  Laterality: N/A;   RADIOLOGY WITH ANESTHESIA N/A 12/03/2019   Procedure: MRI WITH ANESTHESIA CERVICAL WITH AND WITHOUT CONTRAST;  Surgeon: Radiologist, Medication, MD;  Location: [facility];  Service: Radiology;  Laterality: N/A;   RADIOLOGY WITH ANESTHESIA N/A 03/02/2021   Procedure: MRI CERVICAL SPINE WITH AND  WITHOUT CONTRAST;  Surgeon: Radiologist, Medication, MD;  Location: [facility];  Service: Radiology;  Laterality: N/A;   RADIOLOGY WITH ANESTHESIA N/A 09/25/2022   Procedure: MRI WITH ANESTHESIA CERVICAL SPINE WITHOUT CONTRAST;  Surgeon: Radiologist, Medication, MD;  Location: [facility];  Service: Radiology;  Laterality: N/A;   TONSILECTOMY, ADENOIDECTOMY, BILATERAL MYRINGOTOMY AND TUBES  1981   put not aware of tubes being put in ears   TUBAL LIGATION  1981   Past Surgical History:  Procedure Laterality Date   ABDOMINAL AORTOGRAM W/LOWER EXTREMITY N/A 04/02/2018   Procedure: ABDOMINAL AORTOGRAM W/LOWER EXTREMITY;  Surgeon: Wenona Hamilton, MD;  [facility];  Service: Cardiovascular;  Laterality: N/A;   ABDOMINAL AORTOGRAM W/LOWER EXTREMITY N/A 09/23/2019   Procedure: ABDOMINAL AORTOGRAM W/LOWER EXTREMITY;  Surgeon: Wenona Hamilton, MD;  [facility];  Service: Cardiovascular;  Laterality: N/A;   ABDOMINAL HYSTERECTOMY  1999   CERVICAL  SPINE SURGERY  [id number]   C5/6 6/7   COLONOSCOPY     CRANIOTOMY Right 08/23/2020   Procedure: RIGHT CRANIOTOMY HEMATOMA EVACUATION SUBDURAL;  Surgeon: Augusto Blonder, MD;  [facility];  Service: Neurosurgery;  Laterality: Right;   ECTOPIC PREGNANCY SURGERY     Left axillary cyst removal 1989     MULTIPLE EXTRACTIONS WITH ALVEOLOPLASTY N/A 10/25/2017   Procedure: EXTRACTION NUMBERS FIVE, SEVEN, EIGHT, NINE, TEN, FIFTEEN, EIGHTEEN WITH ALVEOLOPLASTY, IRRIGATION AND DEBRIDEMENT LEFT SUBMANDIBULAR INFECTION;  Surgeon: Ascencion Lava, DDS;  Location: [facility];  Service: Oral Surgery;  Laterality: N/A;   PERIPHERAL VASCULAR ATHERECTOMY Right 09/23/2019   Procedure: PERIPHERAL VASCULAR ATHERECTOMY;  Surgeon: Wenona Hamilton, MD;  [facility];  Service: Cardiovascular;  Laterality: Right;  SFA   PERIPHERAL VASCULAR BALLOON ANGIOPLASTY Right 09/23/2019   Procedure: PERIPHERAL VASCULAR BALLOON ANGIOPLASTY;  Surgeon: Wenona Hamilton, MD;  [facility];  Service: Cardiovascular;  Laterality: Right;  SFA   RADIOLOGY WITH ANESTHESIA N/A 11/18/2018   Procedure: MRI OF THE CERVICAL SPINE WITHOUT CONTRAST;  Surgeon: Radiologist, Medication, MD;  [facility]: [facility];  Service: Radiology;  Laterality: N/A;   RADIOLOGY WITH ANESTHESIA N/A 12/03/2019   Procedure: MRI WITH ANESTHESIA CERVICAL WITH AND WITHOUT CONTRAST;  Surgeon: Radiologist, Medication, MD;  [facility]: [facility];  Service: Radiology;  Laterality: N/A;   RADIOLOGY WITH ANESTHESIA N/A 03/02/2021   Procedure: MRI CERVICAL SPINE WITH AND WITHOUT CONTRAST;  Surgeon: Radiologist, Medication, MD;  Location: [facility];  Service: Radiology;  Laterality: N/A;   RADIOLOGY WITH ANESTHESIA N/A 09/25/2022   Procedure: MRI WITH ANESTHESIA CERVICAL SPINE WITHOUT CONTRAST;  Surgeon: Radiologist, Medication, MD;  [facility]: [facility];  Service: Radiology;  Laterality: N/A;   TONSILECTOMY, ADENOIDECTOMY, BILATERAL MYRINGOTOMY AND TUBES  1981   put not aware of tubes being put in ears   TUBAL LIGATION  1981   Past Medical History:  Diagnosis Date   Anemia    in the past   Anxiety    yrs. ago panic attacks   Arthritis    Cancer (HCC)    small in ovary - had total hysterestomy   Family history of adverse reaction to anesthesia    Fibromyalgia    GERD (gastroesophageal reflux disease)    GI bleed    Head injury 08/23/2020   Heart murmur    due to rheumatic fever, PCP no longer hears it   History of blood transfusion    GI Bleed   History of hiatal hernia    Hyperlipidemia    Hypertension    Myalgia    Neuropathy    PAD (peripheral artery disease) (HCC)    PAD (peripheral artery disease) (HCC)    Pre-diabetes    Pyelonephritis 09/10/2019    "septic"   SVT (supraventricular tachycardia) (HCC)    has for seconds, it does not matter what she is doing. Cardiologist is aware.   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, has to have chest xray   Vertigo    since head  injury, have to be careful when walking   BP (!) 157/82   Pulse 80   Ht 5' (1.524 m)   Wt 159 lb (72.1 kg)   SpO2 98%   BMI 31.05 kg/m   Opioid Risk Score:   Fall Risk Score:  `1  Depression screen [facility] 2/9     01/31/2024    9:39 AM 08/02/2023   10:04 AM 08/27/2022  8:29 AM 06/18/2022    1:38 PM 05/25/2021    8:31 AM 02/22/2021    8:26 AM 01/02/2021    1:35 PM  Depression screen [facility] 2/9  Decreased Interest 0 0 0 0 0 0 0  Down, Depressed, Hopeless 0 0 0 0 0 0 0  PHQ - 2 Score 0 0 0 0 0 0 0    Review of Systems  Constitutional: Negative.   HENT: Negative.    Eyes: Negative.   Respiratory: Negative.    Cardiovascular: Negative.   Gastrointestinal: Negative.   Endocrine: Negative.   Genitourinary: Negative.   Musculoskeletal:  Positive for back pain, gait problem and neck pain.       Pain in: left lower leg, right arm, hand & shoulder. Left shoulder pain  Skin: Negative.   Allergic/Immunologic: Negative.   Hematological:  Bruises/bleeds easily.       Plavix   Psychiatric/Behavioral: Negative.    All other systems reviewed and are negative.      Objective:     Gen: no distress, normal appearing HEENT: oral mucosa pink and moist, NCAT Cardio: Reg rate Chest: normal effort, normal rate of breathing Abd: soft, non-distended Ext: no edema Psych: pleasant, normal affect Skin: intact Neuro: Alert and oriented x3 Musculoskeletal: Has full range of motion of right shoulder, 5/5 strength in bilateral upper extremities, 5/5 strength in bilateral lower extremities, impaired FTN on right side     Assessment & Plan:  Chronic pain 2/2 to Cervicalgia/ Cervical Radiculitis/cervicalgia: She is scheduled for Cervical MRI WO Contrast: Dr Nat Badger Following.  Continue HEP as Tolerated. Continue to Monitor. Neurosurgery Following. 08/27/2022 -discussed that medications help her -continue tens unit Foods that may reduce pain: 1) Ginger (especially studied for arthritis)- reduce  leukotriene production to decrease inflammation 2) Blueberries- high in phytonutrients that decrease inflammation 3) Salmon- marine omega-3s reduce joint swelling and pain 4) Pumpkin seeds- reduce inflammation 5) dark chocolate- reduces inflammation 6) turmeric- reduces inflammation 7) tart cherries - reduce pain and stiffness 8) extra virgin olive oil - its compound olecanthal helps to block prostaglandins  9) chili peppers- can be eaten or applied topically via capsaicin 10) mint- helpful for headache, muscle aches, joint pain, and itching 11) garlic- reduces inflammation 12) Green tea- reduces inflammation and oxidative stress, helps with weight loss, may reduce the risk of cancer, recommend Double Green Matcha [place] of Tea daily  Link to further information on diet for chronic pain: http://www.bray.com/   2. Lumbar Radiculitis: continue Topamax  50 mg one tablet twice a day . Continue HEP as tolerated. Continue to monitor. Neurosurgery Following. 08/27/2022 -discussed that this has been painful -Discussed Qutenza as an option for neuropathic pain control. Discussed that this is a capsaicin patch, stronger than capsaicin cream. Discussed that it is currently approved for diabetic peripheral neuropathy and post-herpetic neuralgia, but that it has also shown benefit in treating other forms of neuropathy. Provided patient with link to site to learn more about the patch: https://www.clark.biz/. Discussed that the patch would be placed in office and benefits usually last 3 months. Discussed that unintended exposure to capsaicin can cause severe irritation of eyes, mucous membranes, respiratory tract, and skin, but that Qutenza is a local treatment and does not have the systemic side effects of other nerve medications. Discussed that there may be pain, itching, erythema, and decreased sensory function associated with the  application of Qutenza. Side effects usually subside within 1 week. A cold pack of analgesic medications can help with these  side effects. Blood pressure can also be increased due to pain associated with administration of the patch.   4 patches of Qutenza (64640) was applied to the area of pain. Ice packs were applied during the procedure to ensure patient comfort. Blood pressure was monitored every 15 minutes. The patient tolerated the procedure well. Post-procedure instructions were given and follow-up has been scheduled.  Topical system measures 14cm x20cm (280cm for a total 1120units) were applied which will cause deeper penetration for destruction of the peripheral nerve using a chemical (Qutenza) which infuses into the skin like an injection and heat technique (occlusive, compressive dressing cauing endothermic heat technique)    3.Muscle Spasm: Continue current medication regimen with Cyclobenzaprine   Continue to monitor. 08/27/2022 -discussed that she has tried tizanidine  and willing to try again -discussed that this has worsened  -Discussed current symptoms of pain and history of pain.  -Discussed benefits of exercise in reducing pain. -Discussed following foods that may reduce pain: 1) Ginger (especially studied for arthritis)- reduce leukotriene production to decrease inflammation 2) Blueberries- high in phytonutrients that decrease inflammation 3) Salmon- marine omega-3s reduce joint swelling and pain 4) Pumpkin seeds- reduce inflammation 5) dark chocolate- reduces inflammation 6) turmeric- reduces inflammation 7) tart cherries - reduce pain and stiffness 8) extra virgin olive oil - its compound olecanthal helps to block prostaglandins  9) chili peppers- can be eaten or applied topically via capsaicin 10) mint- helpful for headache, muscle aches, joint pain, and itching 11) garlic- reduces inflammation 12) Green tea- reduces inflammation and oxidative stress, helps with weight  loss, may reduce the risk of cancer, recommend Double Green Matcha [place] of Tea daily  Link to further information on diet for chronic pain: http://www.bray.com/   -d/c tizandine   Prescribed Zynex Nexwave  heating/cooling blanket   Prescribing Home Zynex NexWave Stimulator Device and supplies as needed. IFC, NMES and TENS medically necessary Treatment Rx: Daily @ 30-40 minutes per treatment PRN. Zynex NexWave only, no substitutions. Treatment Goals: 1) To reduce and/or eliminate pain 2) To improve functional capacity and Activities of daily living 3) To reduce or prevent the need for oral medications 4) To improve circulation in the injured region 5) To decrease or prevent muscle spasm and muscle atrophy 6) To provide a self-management tool to the patient The patient has not sufficiently improved with conservative care. Numerous studies indexed by Medline and PubMed.gov have shown Neuromuscular, Interferential, and TENS stimulators to reduce pain, improve function, and reduce medication use in injured patients. Continued use of this evidence based, safe, drug free treatment is both reasonable and medically necessary at this time.   4. Numbness and tingling in bilateral upper extremities :No complaints today. Continue current medication regimen. Continue to monitor. 08/27/2022  -Discussed Qutenza as an option for neuropathic pain control. Discussed that this is a capsaicin patch, stronger than capsaicin cream. Discussed that it is currently approved for diabetic peripheral neuropathy and post-herpetic neuralgia, but that it has also shown benefit in treating other forms of neuropathy. Provided patient with link to site to learn more about the patch: https://www.clark.biz/. Discussed that the patch would be placed in office and benefits usually last 3 months. Discussed that unintended exposure to capsaicin can cause severe  irritation of eyes, mucous membranes, respiratory tract, and skin, but that Qutenza is a local treatment and does not have the systemic side effects of other nerve medications. Discussed that there may be pain, itching, erythema, and decreased sensory function associated with the application of Qutenza.  Side effects usually subside within 1 week. A cold pack of analgesic medications can help with these side effects. Blood pressure can also be increased due to pain associated with administration of the patch.   5.Chronic Pain Syndrome: Continue current medication regimen. Continue to monitor. 08/27/2022   6. Vascular headache: continue topamax   7. HTN: -BP is 157/82 today.  -discussed that sunlight shining on the skin can help increase NO production which helps improve blood flow throughout the body -Advised checking BP daily at home and logging results to bring into follow-up appointment with PCP and myself. -Reviewed BP meds today.  -Advised regarding healthy foods that can help lower blood pressure and provided with a list: 1) citrus foods- high in vitamins and minerals 2) salmon and other fatty fish - reduces inflammation and oxylipins 3) swiss chard (leafy green)- high level of nitrates 4) pumpkin seeds- one of the best natural sources of magnesium  5) Beans and lentils- high in fiber, magnesium , and potassium 6) Berries- high in flavonoids 7) Amaranth (whole grain, can be cooked similarly to rice and oats)- high in magnesium  and fiber 8) Pistachios- even more effective at reducing BP than other nuts 9) Carrots- high in phenolic compounds that relax blood vessels and reduce inflammation 10) Celery- contain phthalides that relax tissues of arterial walls 11) Tomatoes- can also improve cholesterol and reduce risk of heart disease 12) Broccoli- good source of magnesium , calcium , and potassium 13) Greek yogurt: high in potassium and calcium  14) Herbs and spices: Celery seed, cilantro,  saffron, lemongrass, black cumin, ginseng, cinnamon, cardamom, sweet basil, and ginger 15) Chia and flax seeds- also help to lower cholesterol and blood sugar 16) Beets- high levels of nitrates that relax blood vessels  17) spinach and bananas- high in potassium  -Provided lise of supplements that can help with hypertension:  1) magnesium : one high quality brand is Bioptemizers since it contains all 7 types of magnesium , otherwise over the counter magnesium  gluconate 400mg  is a good option 2) B vitamins 3) vitamin D 4) potassium 5) CoQ10 6) L-arginine 7) Vitamin C 8) Beetroot -Educated that goal BP is 120/80. -Made goal to incorporate some of the above foods into diet.    8. Vertigo -discussed relief with Bonine, discussed that this is an antihistamine and she can use it with the tizanidine  -discussed that she tried rehab -discussed that the tubes in her ears could be contributing to her vertigo -referred to ENT -recommended gluten removal   9) Congestion: -continue Vicks vapor rub as needed -can consider bee pollen and local honey  10) Impaired balance: -discussed that she has done PT -referred to ENT  11) Class 1 obesity -Educated regarding health benefits of weight loss- for pain, general health, chronic disease prevention, immune health, mental health.  -Will monitor weight every visit.  -Consider [place] tea daily.  -Discussed the benefits of intermittent fasting. -Discussed foods that can assist in weight loss: 1) leafy greens- high in fiber and nutrients 2) dark chocolate- improves metabolism (if prefer sweetened, best to sweeten with honey instead of sugar).  3) cruciferous vegetables- high in fiber and protein 4) full fat yogurt: high in healthy fat, protein, calcium , and probiotics 5) apples- high in a variety of phytochemicals 6) nuts- high in fiber and protein that increase feelings of fullness 7) grapefruit: rich in nutrients, antioxidants, and fiber (not to  be taken with anticoagulation) 8) beans- high in protein and fiber 9) salmon- has high quality protein and healthy fats 10) green  tea- rich in polyphenols 11) eggs- rich in choline and vitamin D 12) tuna- high protein, boosts metabolism 13) avocado- decreases visceral abdominal fat 14) chicken (pasture raised): high in protein and iron 15) blueberries- reduce abdominal fat and cholesterol 16) whole grains- decreases calories retained during digestion, speeds metabolism 17) chia seeds- curb appetite 18) chilies- increases fat metabolism  -Discussed supplements that can be used:  1) Metatrim 400mg  BID 30 minutes before breakfast and dinner  2) Sphaeranthus indicus and Garcinia mangostana (combinations of these and #1 can be found in capsicum and zychrome  3) green coffee bean extract 400mg  twice per day or Irvingia (african mango) 150 to 300mg  twice per day.   12) History of peripheral neuropathy of feet: Resolved  13) Vitamin D deficiency: -discussed that D3 2,000U daily is better absorbed than D2

## 2024-02-04 ENCOUNTER — Other Ambulatory Visit: Payer: Self-pay | Admitting: Cardiovascular Disease

## 2024-02-11 ENCOUNTER — Ambulatory Visit: Payer: Self-pay

## 2024-02-11 NOTE — Telephone Encounter (Signed)
 Copied from CRM [id number]. Topic: Clinical - Red Word Triage >> Feb 11, 2024  2:29 PM Jethro Morrison wrote: Red Word that prompted transfer to Nurse Triage:PT STATED SHE IS HAVING BREATHING PROBLEMS AND HURTS HER CHEST.

## 2024-02-11 NOTE — Telephone Encounter (Addendum)
 Chief Complaint: SOB Symptoms: intermittent coughing Frequency: 3-4 months Pertinent Negatives: Patient denies fever, URI sx, CP, severe SOB Disposition: [] ED /[] Urgent Care (no appt availability in office) / [x] Appointment(In office/virtual)/ []  [facility]/ [] Home Care/ [] Refused Recommended Disposition /[] [facility]/ []  Follow-up with PCP Additional Notes: Pt c/o worsening SOB/inability to take full, deep breaths x 3-4 months. Triager does not appreciate audible SOB/wheezing during call. Pt is speaking in full sentences. Pt endorses some environmental dust that may trigger sx as well as mask wearing. Pt reports has albuterol  INH, but has not used in a while. Additionally, pt reporting that she is also do for lung imaging. Scheduled patient per protocol on 02/12/2024 with alternate provider. Patient verbalized understanding and to call back PRN.      Copied from CRM [id number]. Topic: Clinical - Red Word Triage >> Feb 11, 2024  2:29 PM Dyann Glaser G wrote: Red Word that prompted transfer to Nurse Triage:PT STATED SHE IS HAVING BREATHING PROBLEMS AND HURTS HER CHEST. >> Feb 11, 2024  2:43 PM Ilene Malling wrote: Patient [phone] is calling back to speak with NT earlier today, the connection was not good and the nurse was going to call back. Warm transferred to NT.  Reason for Disposition  [1] MILD longstanding difficulty breathing AND [2]  SAME as normal  Answer Assessment - Initial Assessment Questions E2C2 Pulmonary Triage - Initial Assessment Questions "Chief Complaint (e.g., cough, sob, wheezing, fever, chills, sweat or additional symptoms) *Go to specific symptom protocol after initial questions. SOB (thinks r/t dust from ventilation so wears a masks), coughing (notices with eating/drinking) Also due for F/U and lung imaging  "How long have symptoms been present?" 3-4 months  Have you tested for COVID or Flu? Note: If not, ask patient if a home test can be taken.  If so, instruct patient to call back for positive results. No  MEDICINES:   "Have you used any OTC meds to help with symptoms?" No If yes, ask "What medications?" N/A  "Have you used your inhalers/maintenance medication?" Yes If yes, "What medications?" Albuterol  INH PRN - does not use often  If inhaler, ask "How many puffs and how often?" Note: Review instructions on medication in the chart. See above  OXYGEN: "Do you wear supplemental oxygen?" No If yes, "How many liters are you supposed to use?" N/a  "Do you monitor your oxygen levels?" Yes If yes, "What is your reading (oxygen level) today?" 97  "What is your usual oxygen saturation reading?"  (Note: Pulmonary O2 sats should be 90% or greater) 96-97    3. PATTERN "Does the difficult breathing come and go, or has it been constant since it started?"      intermittent 4. SEVERITY: "How bad is your breathing?" (e.g., mild, moderate, severe)    - MILD: No SOB at rest, mild SOB with walking, speaks normally in sentences, can lie down, no retractions, pulse < 100.    - MODERATE: SOB at rest, SOB with minimal exertion and prefers to sit, cannot lie down flat, speaks in phrases, mild retractions, audible wheezing, pulse 100-120.    - SEVERE: Very SOB at rest, speaks in single words, struggling to breathe, sitting hunched forward, retractions, pulse > 120      Mild-mod - reports more "tight breathing" 5. RECURRENT SYMPTOM: "Have you had difficulty breathing before?" If Yes, ask: "When was the last time?" and "What happened that time?"      First time 6. CARDIAC HISTORY: "Do you  have any history of heart disease?" (e.g., heart attack, angina, bypass surgery, angioplasty)      Denies Family hx, hx of tachycardia 7. LUNG HISTORY: "Do you have any history of lung disease?"  (e.g., pulmonary embolus, asthma, emphysema)     emphysema 8. CAUSE: "What do you think is causing the breathing problem?"      environment  Protocols used:  Breathing Difficulty-A-AH

## 2024-02-11 NOTE — Telephone Encounter (Signed)
 FYI Dr Gaynell Keeler. Pt is scheduled to see you tomorrow. NFN

## 2024-02-12 ENCOUNTER — Ambulatory Visit (INDEPENDENT_AMBULATORY_CARE_PROVIDER_SITE_OTHER): Admitting: Pulmonary Disease

## 2024-02-12 ENCOUNTER — Encounter: Payer: Self-pay | Admitting: Pulmonary Disease

## 2024-02-12 VITALS — BP 145/72 | HR 78 | Ht 60.0 in | Wt 155.6 lb

## 2024-02-12 DIAGNOSIS — Z87891 Personal history of nicotine dependence: Secondary | ICD-10-CM

## 2024-02-12 DIAGNOSIS — R0602 Shortness of breath: Secondary | ICD-10-CM | POA: Diagnosis not present

## 2024-02-12 DIAGNOSIS — F17200 Nicotine dependence, unspecified, uncomplicated: Secondary | ICD-10-CM

## 2024-02-12 NOTE — Patient Instructions (Signed)
 We will order a low-dose CT scan of the chest to compare with previous  Schedule you for breathing study to compare with previous  Follow-up in 3 months

## 2024-02-12 NOTE — Progress Notes (Signed)
 Amy Bruce    [id number]    May 17, 1951  Primary Care Physician:Morrow, Thurston Flow, MD  Referring Physician: Ronna Coho, MD [address] [place],  [place] [address]  Chief complaint:   In for follow-up  HPI:  Was last seen in 2023  She feels breathing is a little bit worse, more short of breath Sometimes gets short of breath with no activity  She feels she is getting exposed to irritants at home, dust, smoking - She is looking into relocating but has not been able to facilitate this yet Does have air filter systems in place  She does try to stay active, committed to doing about [phone] steps a day  Smoked in the past quit in 20 21/20 pack-year smoking history  No wheezing She does use albuterol  probably about once or twice a week   Outpatient Encounter Medications as of 02/12/2024  Medication Sig   albuterol  (VENTOLIN  HFA) 108 (90 Base) MCG/ACT inhaler INHALE 1 TO 2 PUFFS INTO THE LUNGS EVERY 4 HOURS AS NEEDED FOR WHEEZING   atorvastatin  (LIPITOR ) 80 MG tablet TAKE 1 TABLET(80 MG) BY MOUTH DAILY   calcium  carbonate (TUMS) 500 MG chewable tablet    clopidogrel  (PLAVIX ) 75 MG tablet TAKE 1 TABLET(75 MG) BY MOUTH DAILY   cyclobenzaprine  (FLEXERIL ) 10 MG tablet Take 10 mg by mouth in the morning and at bedtime.   diazepam  (VALIUM ) 5 MG tablet    fexofenadine (ALLEGRA) 180 MG tablet Take 180 mg by mouth in the morning.   fluticasone (FLONASE) 50 MCG/ACT nasal spray Place 2 sprays into both nostrils daily as needed for allergies or rhinitis.   Omega-3 Fatty Acids (FISH OIL) 1000 MG CAPS Take 1,000 mg by mouth in the morning.   topiramate  (TOPAMAX ) 100 MG tablet Take 1 tablet (100 mg total) by mouth 2 (two) times daily.   vitamin B-12 (CYANOCOBALAMIN ) 100 MCG tablet Take 100 mcg by mouth daily.   Vitamin D, Ergocalciferol, (DRISDOL) 1.25 MG (50000 UNIT) CAPS capsule Take 50,000 Units by mouth once a week.   clopidogrel  (PLAVIX ) 75 MG tablet  (Patient  not taking: Reported on 02/12/2024)   clopidogrel  (PLAVIX ) 75 MG tablet TAKE 1 TABLET(75 MG) BY MOUTH DAILY (Patient not taking: Reported on 02/12/2024)   No facility-administered encounter medications on file as of 02/12/2024.    Allergies as of 02/12/2024 - Review Complete 02/12/2024  Allergen Reaction Noted   Carisoprodol Swelling 08/21/2016   Duloxetine  hcl Hypertension and Swelling 01/24/2017    Past Medical History:  Diagnosis Date   Anemia    in the past   Anxiety    yrs. 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 Cardiologist is aware.   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, has to have chest xray   Vertigo    since head injury, have to be careful when walking    Past Surgical History:  Procedure Laterality Date   ABDOMINAL AORTOGRAM W/LOWER EXTREMITY N/A 04/02/2018   Procedure: ABDOMINAL AORTOGRAM W/LOWER EXTREMITY;  Surgeon: Wenona Hamilton, MD;  Location: [facility];  Service: Cardiovascular;  Laterality: N/A;   ABDOMINAL AORTOGRAM W/LOWER EXTREMITY N/A 09/23/2019   Procedure: ABDOMINAL AORTOGRAM W/LOWER EXTREMITY;  Surgeon: Wenona Hamilton, MD;  Location: [facility];  Service: Cardiovascular;  Laterality: N/A;   ABDOMINAL HYSTERECTOMY  1999   CERVICAL SPINE SURGERY  8119,1478   C5/6 6/7   COLONOSCOPY     CRANIOTOMY  Right 08/23/2020   Procedure: RIGHT CRANIOTOMY HEMATOMA EVACUATION SUBDURAL;  Surgeon: Augusto Blonder, MD;  Location: [facility];  Service: Neurosurgery;  Laterality: Right;   ECTOPIC PREGNANCY SURGERY     Left axillary cyst removal 1989     MULTIPLE EXTRACTIONS WITH ALVEOLOPLASTY N/A 10/25/2017   Procedure: EXTRACTION NUMBERS FIVE, SEVEN, EIGHT, NINE, TEN, FIFTEEN, EIGHTEEN WITH ALVEOLOPLASTY, IRRIGATION AND DEBRIDEMENT LEFT SUBMANDIBULAR INFECTION;  Surgeon: Ascencion Lava, DDS;  Location: [facility];  Service: Oral Surgery;  Laterality: N/A;   PERIPHERAL VASCULAR ATHERECTOMY Right 09/23/2019   Procedure: PERIPHERAL VASCULAR ATHERECTOMY;  Surgeon: Wenona Hamilton, MD;  Location: [facility];  Service: Cardiovascular;  Laterality: Right;  SFA   PERIPHERAL VASCULAR BALLOON ANGIOPLASTY Right 09/23/2019   Procedure: PERIPHERAL VASCULAR BALLOON ANGIOPLASTY;  Surgeon: Wenona Hamilton, MD;  Location: [facility];  Service: Cardiovascular;  Laterality: Right;  SFA   RADIOLOGY WITH ANESTHESIA N/A 11/18/2018   Procedure: MRI OF THE CERVICAL SPINE WITHOUT CONTRAST;  Surgeon: Radiologist, Medication, MD;  Location: [facility];  Service: Radiology;  Laterality: N/A;   RADIOLOGY WITH ANESTHESIA N/A 12/03/2019   Procedure: MRI WITH ANESTHESIA CERVICAL WITH AND WITHOUT CONTRAST;  Surgeon: Radiologist, Medication, MD;  Location: [facility];  Service: Radiology;  Laterality: N/A;   RADIOLOGY WITH ANESTHESIA N/A 03/02/2021   Procedure: MRI CERVICAL SPINE WITH AND WITHOUT CONTRAST;  Surgeon: Radiologist, Medication, MD;  Location: [facility];  Service: Radiology;  Laterality: N/A;   RADIOLOGY WITH ANESTHESIA N/A 09/25/2022   Procedure: MRI WITH ANESTHESIA CERVICAL SPINE WITHOUT CONTRAST;  Surgeon: Radiologist, Medication, MD;  Location: [facility];  Service: Radiology;  Laterality: N/A;   TONSILECTOMY, ADENOIDECTOMY, BILATERAL MYRINGOTOMY AND TUBES  1981   put not aware of tubes being put in ears   TUBAL LIGATION  1981     Family History  Problem Relation Age of Onset   Ovarian cancer Mother     Social History   Socioeconomic History   Marital status: Divorced    Spouse name: Not on file   Number of children: Not on file   Years of education: Not on file   Highest education level: Not on file  Occupational History   Not on file  Tobacco Use   Smoking status: Former    Current packs/day: 0.00    Average packs/day: 1 pack/day for 33.0 years (33.0 ttl pk-yrs)    Types: Cigarettes    Start date: 08/1987    Quit date: 08/2020    Years since quitting: 3.5   Smokeless tobacco: Never   Tobacco comments:    2 ciggs a day  Vaping Use   Vaping  status: Never Used  Substance and Sexual Activity   Alcohol use: Not Currently    Alcohol/week: 2.0 standard drinks of alcohol    Types: 2 Shots of liquor per week    Comment: "not since 2021" - 03/01/21   Drug use: No   Sexual activity: Not on file  Other Topics Concern   Not on file  Social History Narrative   Not on file   Social Drivers of Health   Financial Resource Strain: Not on file  Food Insecurity: Not on file  Transportation Needs: Not on file  Physical Activity: Not on file  Stress: Not on file  Social Connections: Unknown (01/23/2022)   Received from [facility], [facility]   Social Network    Social Network: Not on file  Intimate Partner Violence: Unknown (12/14/2021)   Received from [facility], [facility]   HITS    Physically Hurt: Not on file    Insult or Talk Down To: Not on file    Threaten Physical Harm: Not on file    Scream or Curse: Not on file    Review of Systems  Respiratory:  Positive for shortness of breath. Negative for cough.     Vitals:   02/12/24 1500  BP: (!) 145/72  Pulse: 78  SpO2: 99%     Physical Exam Constitutional:      Appearance: Normal appearance.  HENT:     Head: Normocephalic.     Mouth/Throat:     Mouth: Mucous membranes are moist.  Eyes:     General: No scleral  icterus. Cardiovascular:     Rate and Rhythm: Normal rate and regular rhythm.     Heart sounds: No murmur heard.    No friction rub.  Pulmonary:     Effort: No respiratory distress.     Breath sounds: No stridor. No wheezing or rhonchi.  Musculoskeletal:     Cervical back: No rigidity or tenderness.  Neurological:     Mental Status: She is alert.  Psychiatric:        Mood and Affect: Mood normal.      Data Reviewed: Previous PFT from 2021 reviewed with the patient showing no obstruction, no restriction, normal diffusing capacity  Last low-dose CT scan of the chest was in 2020 with evidence of emphysema-film was reviewed  Assessment:  Shortness of breath  Previous heavy smoker  Obstructive lung disease/emphysema on previous CT  Plan/Recommendations: Obtain CT scan of the chest-low-dose CT for cancer screening  Schedule for pulmonary function test  Encouraged to continue graded activities  Avoid irritants is much as possible  Follow-up in about 3 months  Encouraged to call with significant concerns  Continue albuterol  use as needed, does not need any other inhaler at present  She does have a history of claustrophobia, does have diazepam  in hand to use for the CAT scan   Myer Artis MD [facility] 02/12/2024, 3:12 PM  CC: Ronna Coho, MD

## 2024-02-13 ENCOUNTER — Other Ambulatory Visit: Payer: Self-pay | Admitting: Family Medicine

## 2024-02-13 DIAGNOSIS — Z1231 Encounter for screening mammogram for malignant neoplasm of breast: Secondary | ICD-10-CM

## 2024-02-20 ENCOUNTER — Ambulatory Visit
Admission: RE | Admit: 2024-02-20 | Discharge: 2024-02-20 | Disposition: A | Discharge status: Home / Self Care | Source: Ambulatory Visit | Attending: Family Medicine | Admitting: Family Medicine

## 2024-02-20 DIAGNOSIS — Z1231 Encounter for screening mammogram for malignant neoplasm of breast: Secondary | ICD-10-CM

## 2024-02-24 ENCOUNTER — Ambulatory Visit
Admission: RE | Admit: 2024-02-24 | Discharge: 2024-02-24 | Disposition: A | Discharge status: Home / Self Care | Source: Ambulatory Visit | Attending: Pulmonary Disease | Admitting: Pulmonary Disease

## 2024-02-24 DIAGNOSIS — Z87891 Personal history of nicotine dependence: Secondary | ICD-10-CM

## 2024-02-24 DIAGNOSIS — Z122 Encounter for screening for malignant neoplasm of respiratory organs: Secondary | ICD-10-CM | POA: Diagnosis not present

## 2024-05-04 ENCOUNTER — Encounter: Payer: Self-pay | Admitting: Physical Medicine and Rehabilitation

## 2024-05-04 ENCOUNTER — Encounter: Attending: Physical Medicine and Rehabilitation | Admitting: Physical Medicine and Rehabilitation

## 2024-05-04 VITALS — BP 158/82 | HR 83 | Ht 60.0 in | Wt 154.6 lb

## 2024-05-04 DIAGNOSIS — M5412 Radiculopathy, cervical region: Secondary | ICD-10-CM | POA: Insufficient documentation

## 2024-05-04 MED ORDER — CAPSAICIN-CLEANSING GEL 8 % EX KIT
4.0000 | PACK | Freq: Once | CUTANEOUS | Status: AC
  Administered 2024-05-04: 4 via TOPICAL

## 2024-05-04 NOTE — Progress Notes (Signed)
-  Discussed Qutenza  as an option for neuropathic pain control. Discussed that this is a capsaicin  patch, stronger than capsaicin  cream. Discussed that it is currently approved for diabetic peripheral neuropathy and post-herpetic neuralgia, but that it has also shown benefit in treating other forms of neuropathy. Provided patient with link to site to learn more about the patch: https://www.qutenza .com/. Discussed that the patch would be placed in office and benefits usually last 3 months. Discussed that unintended exposure to capsaicin  can cause severe irritation of eyes, mucous membranes, respiratory tract, and skin, but that Qutenza  is a local treatment and does not have the systemic side effects of other nerve medications. Discussed that there may be pain, itching, erythema, and decreased sensory function associated with the application of Qutenza . Side effects usually subside within 1 week. A cold pack of analgesic medications can help with these side effects. Blood pressure can also be increased due to pain associated with administration of the patch.   4 patches of Qutenza  (35359) was applied to bilateral shoulders and arms. Ice packs were applied during the procedure to ensure patient comfort. Blood pressure was monitored every 15 minutes. The patient tolerated the procedure well. Post-procedure instructions were given and follow-up has been scheduled.  Topical system measures 14cm x20cm (280cm for a total 1120units) were applied which will cause deeper penetration for destruction of the peripheral nerve using a chemical (Qutenza ) which infuses into the skin like an injection and heat technique (occlusive, compressive dressing cauing endothermic heat technique)

## 2024-05-05 ENCOUNTER — Encounter: Payer: Self-pay | Admitting: Physical Medicine and Rehabilitation

## 2024-05-06 ENCOUNTER — Other Ambulatory Visit: Payer: Self-pay | Admitting: Physical Medicine and Rehabilitation

## 2024-05-06 DIAGNOSIS — M5412 Radiculopathy, cervical region: Secondary | ICD-10-CM

## 2024-05-18 DIAGNOSIS — G959 Disease of spinal cord, unspecified: Secondary | ICD-10-CM | POA: Diagnosis not present

## 2024-05-22 ENCOUNTER — Other Ambulatory Visit: Payer: Self-pay

## 2024-05-22 MED ORDER — ALBUTEROL SULFATE HFA 108 (90 BASE) MCG/ACT IN AERS: 1.0000 | INHALATION_SPRAY | RESPIRATORY_TRACT | 3 refills | Status: DC | PRN

## 2024-06-10 ENCOUNTER — Encounter: Payer: Self-pay | Admitting: Physical Medicine and Rehabilitation

## 2024-06-12 ENCOUNTER — Other Ambulatory Visit: Payer: Self-pay | Admitting: Physical Medicine and Rehabilitation

## 2024-06-12 DIAGNOSIS — M5010 Cervical disc disorder with radiculopathy, unspecified cervical region: Secondary | ICD-10-CM

## 2024-06-15 ENCOUNTER — Encounter (INDEPENDENT_AMBULATORY_CARE_PROVIDER_SITE_OTHER): Payer: Self-pay | Admitting: Otolaryngology

## 2024-06-15 ENCOUNTER — Ambulatory Visit (INDEPENDENT_AMBULATORY_CARE_PROVIDER_SITE_OTHER): Admitting: Audiology

## 2024-06-15 ENCOUNTER — Ambulatory Visit (INDEPENDENT_AMBULATORY_CARE_PROVIDER_SITE_OTHER): Admitting: Otolaryngology

## 2024-06-15 VITALS — BP 132/71 | HR 70 | Temp 98.2°F | Ht 60.0 in | Wt 153.0 lb

## 2024-06-15 DIAGNOSIS — H903 Sensorineural hearing loss, bilateral: Secondary | ICD-10-CM | POA: Diagnosis not present

## 2024-06-15 DIAGNOSIS — H9313 Tinnitus, bilateral: Secondary | ICD-10-CM | POA: Diagnosis not present

## 2024-06-15 DIAGNOSIS — R42 Dizziness and giddiness: Secondary | ICD-10-CM | POA: Diagnosis not present

## 2024-06-15 DIAGNOSIS — H6123 Impacted cerumen, bilateral: Secondary | ICD-10-CM | POA: Diagnosis not present

## 2024-06-16 NOTE — Progress Notes (Signed)
  [address] [place], [place] [address] [phone]  Audiological Evaluation    Name: Amy Bruce     DOB:   09-21-1950      MRN:   [id number]                                                                                     Service Date: 06/16/2024     Accompanied by: unaccompanied   Patient comes today after Dr. Karis, ENT sent a referral for a hearing evaluation due to concerns with hearing loss.   Symptoms Yes Details  Hearing loss  [x]  Reports that she notices difficulty hearing other people.  Tinnitus  []    Ear pain/ infections/pressure  []    Balance problems  []    Noise exposure history  []    Previous ear surgeries  []    Family history of hearing loss  []    Amplification  []    Other  []      Otoscopy: Right ear: Clear external ear canal and notable landmarks visualized on the tympanic membrane. Left ear:  Clear external ear canal and notable landmarks visualized on the tympanic membrane. Of note- test was completed after wax was removed by Dr. Karis.   Tympanometry: Right ear: Type A- Normal external ear canal volume with normal middle ear pressure and tympanic membrane compliance. Left ear: Type A- Normal external ear canal volume with normal middle ear pressure and tympanic membrane compliance.   Pure tone Audiometry: Both ears- Normal to moderately severe sensorineural hearing loss from 250 Hz - 8000 Hz.   Speech Audiometry: Right ear- Speech Reception Threshold (SRT) was obtained at 40 dBHL. Left ear-Speech Reception Threshold (SRT) was obtained at 35 dBHL.   Word Recognition Score Tested using NU-6 (recorded) Right ear: 100% was obtained at a presentation level of 60 dBHL with contralateral masking which is deemed as  excellent. Left ear: 100% was obtained at a presentation level of 60 dBHL with contralateral masking which is deemed as  excellent.   The hearing test results were completed under inserts and results are deemed to be of good  reliability. Test technique:  conventional    Impression: There is not a significant difference in pure-tone thresholds between ears. There is not a significant difference in the word recognition score in between ears.    Recommendations: Follow up with ENT as needed.  Return for a hearing evaluation if concerns with hearing changes arise or per MD recommendation. Consider a communication needs assessment after medical clearance for hearing aids is obtained.   Analicia Skibinski MARIE LEROUX-MARTINEZ, AUD

## 2024-06-18 DIAGNOSIS — H903 Sensorineural hearing loss, bilateral: Secondary | ICD-10-CM | POA: Insufficient documentation

## 2024-06-18 DIAGNOSIS — H6123 Impacted cerumen, bilateral: Secondary | ICD-10-CM | POA: Insufficient documentation

## 2024-06-18 DIAGNOSIS — R42 Dizziness and giddiness: Secondary | ICD-10-CM | POA: Insufficient documentation

## 2024-06-18 DIAGNOSIS — H9313 Tinnitus, bilateral: Secondary | ICD-10-CM | POA: Insufficient documentation

## 2024-06-18 NOTE — Progress Notes (Signed)
 CC: Recurrent dizziness, bilateral tinnitus  HPI: The patient is a 73 year old female who presents today complaining of recurrent dizziness and bilateral tinnitus.  She describes her dizziness as a spinning vertigo that last from seconds to minutes.  In between episodes, she also has difficulty with her balance.  She has been symptomatic for many years.  The frequency and severity of her dizziness increased in December 2021, after she was involved in a traumatic head injury.  She was evaluated by neurosurgery and physical therapy.  She was diagnosed with BPPV, and was treated by her physical therapist.  Her symptoms initially improved, but has since recurred.  She previously underwent bilateral myringotomy and tube placement and adenotonsillectomy as an adult.  She also complains of constant bilateral high-pitched tinnitus.  It is nonpulsatile.  She denies any significant hearing difficulty.  Currently she denies any otalgia or otorrhea.  Past Medical History:  Diagnosis Date   Anemia    in the past   Anxiety    yrs. 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 Cardiologist is aware.   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, has to have chest xray   Vertigo    since head injury, have to be careful when walking    Past Surgical History:  Procedure Laterality Date   ABDOMINAL  AORTOGRAM W/LOWER EXTREMITY N/A 04/02/2018   Procedure: ABDOMINAL AORTOGRAM W/LOWER EXTREMITY;  Surgeon: Darron Deatrice LABOR, MD;  [facility]: [facility];  Service: Cardiovascular;  Laterality: N/A;   ABDOMINAL AORTOGRAM W/LOWER EXTREMITY N/A 09/23/2019   Procedure: ABDOMINAL AORTOGRAM W/LOWER EXTREMITY;  Surgeon: Darron Deatrice LABOR, MD;  [facility]: [facility];  Service: Cardiovascular;  Laterality: N/A;   ABDOMINAL HYSTERECTOMY  1999   CERVICAL SPINE SURGERY  8008,7979   C5/6 6/7   COLONOSCOPY     CRANIOTOMY Right 08/23/2020   Procedure: RIGHT CRANIOTOMY HEMATOMA EVACUATION SUBDURAL;  Surgeon: Lanis Pupa, MD;  Location: [facility];  Service: Neurosurgery;  Laterality: Right;   ECTOPIC PREGNANCY SURGERY     Left axillary cyst removal 1989     MULTIPLE EXTRACTIONS WITH ALVEOLOPLASTY N/A 10/25/2017   Procedure: EXTRACTION NUMBERS FIVE, SEVEN, EIGHT, NINE, TEN, FIFTEEN, EIGHTEEN WITH ALVEOLOPLASTY, IRRIGATION AND DEBRIDEMENT LEFT SUBMANDIBULAR INFECTION;  Surgeon: Sheryle Hamilton, DDS;  Location: [facility];  Service: Oral Surgery;  Laterality: N/A;   PERIPHERAL VASCULAR ATHERECTOMY Right 09/23/2019   Procedure: PERIPHERAL VASCULAR ATHERECTOMY;  Surgeon: Darron Deatrice LABOR, MD;  [facility];  Service: Cardiovascular;  Laterality: Right;  SFA   PERIPHERAL VASCULAR BALLOON ANGIOPLASTY Right 09/23/2019   Procedure: PERIPHERAL VASCULAR BALLOON ANGIOPLASTY;  Surgeon: Darron Deatrice LABOR, MD;  Location: [facility]  INVASIVE CV LAB;  Service: Cardiovascular;  Laterality: Right;  SFA   RADIOLOGY WITH ANESTHESIA N/A 11/18/2018   Procedure: MRI OF THE CERVICAL SPINE WITHOUT CONTRAST;  Surgeon: Radiologist, Medication, MD;  [facility]: [facility];  Service: Radiology;  Laterality: N/A;   RADIOLOGY WITH ANESTHESIA N/A 12/03/2019   Procedure: MRI WITH ANESTHESIA CERVICAL WITH AND WITHOUT CONTRAST;  Surgeon: Radiologist, Medication, MD;  [facility];  Service: Radiology;  Laterality: N/A;   RADIOLOGY WITH  ANESTHESIA N/A 03/02/2021   Procedure: MRI CERVICAL SPINE WITH AND WITHOUT CONTRAST;  Surgeon: Radiologist, Medication, MD;  [facility];  Service: Radiology;  Laterality: N/A;   RADIOLOGY WITH ANESTHESIA N/A 09/25/2022   Procedure: MRI WITH ANESTHESIA CERVICAL SPINE WITHOUT CONTRAST;  Surgeon: Radiologist, Medication, MD;  [facility];  Service: Radiology;  Laterality: N/A;   TONSILECTOMY, ADENOIDECTOMY, BILATERAL MYRINGOTOMY AND TUBES  1981   put not aware of tubes being put in ears   TUBAL LIGATION  1981    Family History  Problem Relation Age of Onset   Ovarian cancer Mother     Social History:  reports that she quit smoking about 3 years ago. Her smoking use included cigarettes. She started smoking about 36 years ago. She has a 33 pack-year smoking history. She has never used smokeless tobacco. She reports that she does not currently use alcohol after a past usage of about 2.0 standard drinks of alcohol per week. She reports that she does not use drugs.  Allergies:  Allergies  Allergen Reactions   Carisoprodol Swelling    Other reaction(s): tongue swelling  Other Reaction(s): tongue swelling   Duloxetine  Hcl Hypertension and Swelling    Other Reaction(s): dizziness    Prior to Admission medications   Medication Sig Start Date End Date Taking? Authorizing Provider  albuterol  (VENTOLIN  HFA) 108 (90 Base) MCG/ACT inhaler Inhale 1-2 puffs into the lungs every 4 (four) hours as needed for wheezing or shortness of breath. 05/22/24 05/22/25 Yes Ruthell Lauraine FALCON, NP  atorvastatin  (LIPITOR ) 80 MG tablet TAKE 1 TABLET(80 MG) BY MOUTH DAILY 01/06/24  Yes Darron Deatrice LABOR, MD  calcium  carbonate (TUMS) 500 MG chewable tablet    Yes [provider]  clopidogrel  (PLAVIX ) 75 MG tablet TAKE 1 TABLET(75 MG) BY MOUTH DAILY 03/12/23  Yes Darron Deatrice LABOR, MD  clopidogrel  (PLAVIX ) 75 MG tablet    Yes [provider]  clopidogrel  (PLAVIX ) 75 MG tablet TAKE 1 TABLET(75 MG) BY MOUTH  DAILY 02/05/24  Yes Darron Deatrice LABOR, MD  cyclobenzaprine  (FLEXERIL ) 10 MG tablet Take 10 mg by mouth in the morning and at bedtime. 12/28/20  Yes [provider]  diazepam  (VALIUM ) 5 MG tablet  12/24/23  Yes [provider]  fexofenadine (ALLEGRA) 180 MG tablet Take 180 mg by mouth in the morning.   Yes [provider]  fluticasone (FLONASE) 50 MCG/ACT nasal spray Place 2 sprays into both nostrils daily as needed for allergies or rhinitis.   Yes [provider]  Omega-3 Fatty Acids (FISH OIL) 1000 MG CAPS Take 1,000 mg by mouth in the morning.   Yes [provider]  topiramate  (TOPAMAX ) 100 MG tablet Take 1 tablet (100 mg total) by mouth 2 (two) times daily. 01/31/24  Yes Raulkar, Sven SQUIBB, MD  vitamin B-12 (CYANOCOBALAMIN ) 100 MCG tablet Take 100 mcg by mouth daily.   Yes [provider]  Vitamin D, Ergocalciferol, (DRISDOL) 1.25 MG (50000 UNIT) CAPS capsule Take 50,000 Units by mouth once a week.  01/01/24  Yes [provider]    Blood pressure 132/71, pulse 70, temperature 98.2 F (36.8 C), temperature source Oral, height 5' (1.524 m), weight 153 lb (69.4 kg), SpO2 98%. Exam: General: Communicates without difficulty, well nourished, no acute distress. Head: Normocephalic, no evidence injury, no tenderness, facial buttresses intact without stepoff. Face/sinus: No tenderness to palpation and percussion. Facial movement is normal and symmetric. Eyes: PERRL, EOMI. No scleral icterus, conjunctivae clear. Neuro: CN II exam reveals vision grossly intact.  No nystagmus at any point of gaze. Ears: Auricles well formed without lesions.  Bilateral cerumen impaction.  Nose: External evaluation reveals normal support and skin without lesions.  Dorsum is intact.  Anterior rhinoscopy reveals congested mucosa over anterior aspect of inferior turbinates and intact septum.  No purulence noted. Oral:  Oral cavity and oropharynx are intact, symmetric, without  erythema or edema.  Mucosa is moist without lesions. Neck: Full range of motion without pain.  There is no significant lymphadenopathy.  No masses palpable.  Thyroid  bed within normal limits to palpation.  Parotid glands and submandibular glands equal bilaterally without mass.  Trachea is midline. Neuro:  CN 2-12 grossly intact. Vestibular: No nystagmus at any point of gaze. Dix Hallpike negative. Vestibular: There is no nystagmus with pneumatic pressure on either tympanic membrane or Valsalva. The cerebellar examination is unremarkable.   Procedure: Bilateral cerumen disimpaction Anesthesia: None Description: Under the operating microscope, the cerumen is carefully removed with a combination of cerumen currette, alligator forceps, and suction catheters.  After the cerumen is removed, the TMs are noted to be normal.  No mass, erythema, or lesions. The patient tolerated the procedure well.    Her hearing test shows bilateral symmetric high-frequency sensorineural hearing loss.  Assessment  1.  Recurrent dizziness, likely secondary to transient BPPV.  In addition, the patient also has difficulty with her balance in between spinning vertigo.  Other possible differential diagnoses include vestibular migraine, Meniere's disease, peripheral vestibular dysfunction, or other central/systemic causes.   2.  Bilateral cerumen impaction.  After the disimpaction procedure, both tympanic membranes and middle ear spaces are noted to be normal.  No middle ear effusion is noted.  Her Dix-Hallpike maneuver is negative. 3.  Bilateral symmetric high-frequency sensorineural hearing loss, likely secondary to presbycusis. 4.  Bilateral tinnitus, likely secondary to the sensorineural hearing loss.  Plan: 1.  Otomicroscopy with bilateral cerumen disimpaction. 2.  The physical exam findings and the hearing test results are reviewed with the patient. 3.  The strategies to cope with tinnitus, including the use of masker,  hearing aids, tinnitus retraining therapy, and avoidance of caffeine and alcohol are discussed. 4.  The pathophysiology of vestibular dysfunction and dizziness are discussed extensively with the patient. The possible differential diagnoses are reviewed. Questions are invited and answered.   5.  The patient will likely benefit from undergoing physical therapy/vestibular rehabilitation to improve her balancing function. A referral will be arranged as soon as possible.  6.  If the patient continues to be symptomatic, she may benefit from vestibular neurodiagnostic testing at a tertiary care center to evaluate for possible vestibular dysfunction.   Kimela Malstrom W Warda Mcqueary 06/18/2024, 8:15 AM

## 2024-07-01 ENCOUNTER — Ambulatory Visit (INDEPENDENT_AMBULATORY_CARE_PROVIDER_SITE_OTHER): Admitting: Pulmonary Disease

## 2024-07-01 ENCOUNTER — Encounter: Payer: Self-pay | Admitting: Pulmonary Disease

## 2024-07-01 VITALS — BP 136/76 | HR 86 | Temp 98.4°F | Ht 60.0 in | Wt 159.6 lb

## 2024-07-01 DIAGNOSIS — J432 Centrilobular emphysema: Secondary | ICD-10-CM

## 2024-07-01 NOTE — Progress Notes (Signed)
 and GERD she is good to go Amy Bruce I did              Amy Bruce    [id number]    Oct 29, 1950  Primary Care Physician:Morrow, Beverley, MD  Referring Physician: Kip Beverley, MD [address] [place],  [place] [address]  Chief complaint:   In for follow-up  HPI:  Seen in June 2025  Had a low-dose CT screening - Areas of emphysema - No lung nodule  Has no significant shortness of breath No significant cough  Has albuterol  to use as needed needed about once or twice a week at most  She feels she is getting exposed to irritants at home, dust, smoking - She is looking into relocating but has not been able to facilitate this yet Does have air filter systems in place  She continues to stay very active  Smoked in the past, quit in 2021  No wheezing No significant limitation in activity level   Outpatient Encounter Medications as of 07/01/2024  Medication Sig   albuterol  (VENTOLIN  HFA) 108 (90 Base) MCG/ACT inhaler Inhale 1-2 puffs into the lungs every 4 (four) hours as needed for wheezing or shortness of breath.   atorvastatin  (LIPITOR ) 80 MG tablet TAKE 1 TABLET(80 MG) BY MOUTH DAILY   calcium  carbonate (TUMS) 500 MG chewable tablet    clopidogrel  (PLAVIX ) 75 MG tablet TAKE 1 TABLET(75 MG) BY MOUTH DAILY   clopidogrel  (PLAVIX ) 75 MG tablet    cyclobenzaprine  (FLEXERIL ) 10 MG tablet Take 10 mg by mouth in the morning and at bedtime.   diazepam  (VALIUM ) 5 MG tablet    fexofenadine (ALLEGRA) 180 MG tablet Take 180 mg by mouth in the morning.   fluticasone (FLONASE) 50 MCG/ACT nasal spray Place 2 sprays into both nostrils daily as needed for allergies or rhinitis.   Omega-3 Fatty Acids (FISH OIL) 1000 MG CAPS Take 1,000 mg by mouth in the morning.   topiramate  (TOPAMAX ) 100 MG tablet Take 1 tablet (100 mg total) by mouth 2 (two) times daily.   vitamin B-12 (CYANOCOBALAMIN ) 100 MCG tablet Take 100 mcg by mouth daily.   Vitamin D, Ergocalciferol, (DRISDOL) 1.25  MG (50000 UNIT) CAPS capsule Take 50,000 Units by mouth once a week.   [DISCONTINUED] clopidogrel  (PLAVIX ) 75 MG tablet TAKE 1 TABLET(75 MG) BY MOUTH DAILY   No facility-administered encounter medications on file as of 07/01/2024.    Allergies as of 07/01/2024 - Review Complete 07/01/2024  Allergen Reaction Noted   Carisoprodol Swelling 08/21/2016   Duloxetine  hcl Hypertension and Swelling 01/24/2017    Past Medical History:  Diagnosis Date   Anemia    in the past   Anxiety    yrs. 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 Cardiologist is aware.   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, has to have chest xray   Vertigo    since head injury, have to be careful when walking    Past Surgical History:  Procedure Laterality Date   ABDOMINAL AORTOGRAM W/LOWER EXTREMITY N/A 04/02/2018   Procedure: ABDOMINAL AORTOGRAM W/LOWER EXTREMITY;  Surgeon: Darron Deatrice LABOR, MD;  [facility];  Service: Cardiovascular;  Laterality: N/A;   ABDOMINAL AORTOGRAM W/LOWER EXTREMITY N/A 09/23/2019   Procedure: ABDOMINAL AORTOGRAM W/LOWER EXTREMITY;  Surgeon: Darron Deatrice LABOR, MD;  [facility];  Service: Cardiovascular;  Laterality: N/A;   ABDOMINAL HYSTERECTOMY  1999   CERVICAL SPINE SURGERY  8008,7979   C5/6 6/7   COLONOSCOPY     CRANIOTOMY Right  08/23/2020   Procedure: RIGHT CRANIOTOMY HEMATOMA EVACUATION SUBDURAL;  Surgeon: Lanis Pupa, MD;  Location: [facility];  Service: Neurosurgery;  Laterality: Right;   ECTOPIC PREGNANCY SURGERY     Left axillary cyst removal 1989     MULTIPLE EXTRACTIONS WITH ALVEOLOPLASTY N/A 10/25/2017   Procedure: EXTRACTION NUMBERS FIVE, SEVEN, EIGHT, NINE, TEN, FIFTEEN, EIGHTEEN WITH ALVEOLOPLASTY, IRRIGATION AND DEBRIDEMENT LEFT SUBMANDIBULAR INFECTION;  Surgeon: Sheryle Hamilton, DDS;  [facility]: [facility];  Service: Oral Surgery;  Laterality: N/A;   PERIPHERAL VASCULAR ATHERECTOMY Right 09/23/2019   Procedure: PERIPHERAL VASCULAR ATHERECTOMY;  Surgeon: Darron Deatrice LABOR, MD;  [facility];  Service: Cardiovascular;  Laterality: Right;  SFA   PERIPHERAL VASCULAR BALLOON ANGIOPLASTY Right 09/23/2019   Procedure: PERIPHERAL VASCULAR BALLOON ANGIOPLASTY;  Surgeon: Darron Deatrice LABOR, MD;  [facility];  Service: Cardiovascular;  Laterality: Right;  SFA   RADIOLOGY WITH ANESTHESIA N/A 11/18/2018   Procedure: MRI OF THE CERVICAL SPINE WITHOUT CONTRAST;  Surgeon: Radiologist, Medication, MD;  [facility];  Service: Radiology;  Laterality: N/A;   RADIOLOGY WITH ANESTHESIA N/A 12/03/2019   Procedure: MRI WITH ANESTHESIA CERVICAL WITH AND WITHOUT CONTRAST;  Surgeon: Radiologist, Medication, MD;  [facility];  Service: Radiology;  Laterality: N/A;   RADIOLOGY WITH ANESTHESIA N/A 03/02/2021   Procedure: MRI CERVICAL SPINE WITH AND WITHOUT CONTRAST;  Surgeon: Radiologist, Medication, MD;  [facility];  Service: Radiology;  Laterality: N/A;   RADIOLOGY WITH ANESTHESIA N/A 09/25/2022   Procedure: MRI WITH ANESTHESIA CERVICAL SPINE WITHOUT CONTRAST;  Surgeon: Radiologist, Medication, MD;  [facility];  Service: Radiology;  Laterality: N/A;   TONSILECTOMY, ADENOIDECTOMY, BILATERAL MYRINGOTOMY AND TUBES  1981   put not aware of tubes being put in ears   TUBAL LIGATION  1981    Family  History  Problem Relation Age of Onset   Ovarian cancer Mother     Social History   Socioeconomic History   Marital status: Divorced    Spouse name: Not on file   Number of children: Not on file   Years of education: Not on file   Highest education level: Not on file  Occupational History   Not on file  Tobacco Use   Smoking status: Former    Current packs/day: 0.00    Average packs/day: 1 pack/day for 33.0 years (33.0 ttl pk-yrs)    Types: Cigarettes    Start date: 08/1987    Quit date: 08/2020    Years since quitting: 3.8   Smokeless tobacco: Never   Tobacco comments:    2 ciggs a day  Vaping Use   Vaping status: Never Used  Substance and Sexual Activity   Alcohol use: Not Currently    Alcohol/week: 2.0 standard drinks of alcohol    Types: 2 Shots of liquor per week    Comment: not since 2021 - 03/01/21   Drug use: No   Sexual activity: Not on file  Other Topics Concern   Not on file  Social History Narrative   Not on file   Social Drivers of Health   Financial Resource Strain: Not on file  Food Insecurity: Not on file  Transportation Needs: Not on file  Physical Activity: Not on file  Stress: Not on file  Social Connections: Unknown (01/23/2022)   Received from [facility]   Social Network    Social Network: Not on file  Intimate Partner Violence: Unknown (12/14/2021)   Received from [facility]   HITS    Physically Hurt: Not on file    Insult or Talk Down To: Not on file    Threaten Physical Harm: Not on file    Scream or Curse: Not on file    Review of Systems  Respiratory:  Positive for shortness of breath. Negative for cough.     Vitals:   07/01/24 0857  BP: 136/76  Pulse: 86  Temp: 98.4 F (36.9 C)  SpO2: 95%     Physical Exam Constitutional:      Appearance: Normal appearance.  HENT:     Head: Normocephalic.     Mouth/Throat:     Mouth: Mucous membranes are moist.  Eyes:     General: No scleral icterus. Cardiovascular:      Rate and Rhythm: Normal rate and regular rhythm.     Heart sounds: No murmur heard.    No friction rub.  Pulmonary:     Effort: No respiratory distress.     Breath sounds: No stridor. No wheezing or rhonchi.  Musculoskeletal:     Cervical back: No rigidity or tenderness.  Neurological:     Mental Status: She is alert.  Psychiatric:        Mood and Affect: Mood normal.      Data Reviewed: Previous PFT from 2021 reviewed with the patient showing no obstruction, no restriction, normal diffusing capacity  Last low-dose CT scan of the chest was in 2020 with evidence of emphysema-film was reviewed Repeat low-dose CT reviewed with the patient showing areas of emphysema  Assessment:  Shortness of breath - This is improved - Does need a rescue inhaler here and there  Previous heavy smoker  Obstructive lung disease/emphysema on previous CT  Plan/Recommendations: Pulmonary function test at next visit  Encouraged to continue graded activities  Avoid irritants  Albuterol  as needed  We do not need any other inhalers at present  Jennet Epley MD [facility] 07/01/2024, 9:13 AM  CC: Kip Righter, MD

## 2024-07-01 NOTE — Patient Instructions (Signed)
 I will see you back in about 6 months  We can get the breathing study done on the day you come back in  Continue using albuterol  as needed  Call us  with significant concerns  Your CT scan of your chest does show some areas of emphysema, no other significant findings of concern

## 2024-07-20 ENCOUNTER — Telehealth: Payer: Self-pay

## 2024-07-20 DIAGNOSIS — M5412 Radiculopathy, cervical region: Secondary | ICD-10-CM

## 2024-07-20 MED ORDER — QUTENZA (4 PATCH) 8 % EX KIT: 4.0000 | PACK | Freq: Once | CUTANEOUS | 0 refills | Status: AC

## 2024-07-20 NOTE — Telephone Encounter (Signed)
 Approved on August 22 by [employer] 2017 NCPDP Request Reference Number: [id number]. QUTENZA  KIT 8% 4-PCH is approved through 09/09/2024. Your patient may now fill this prescription and it will be covered. Effective Date: 05/01/2024 Authorization Expiration Date: 09/09/2024    Qutenza  4 patches prescription sent to [facility]

## 2024-08-04 ENCOUNTER — Encounter: Attending: Physical Medicine and Rehabilitation | Admitting: Physical Medicine and Rehabilitation

## 2024-08-04 ENCOUNTER — Encounter: Payer: Self-pay | Admitting: Physical Medicine and Rehabilitation

## 2024-08-04 ENCOUNTER — Other Ambulatory Visit: Payer: Self-pay | Admitting: Physical Medicine and Rehabilitation

## 2024-08-04 VITALS — BP 129/71 | HR 86 | Ht 60.0 in | Wt 156.0 lb

## 2024-08-04 DIAGNOSIS — E559 Vitamin D deficiency, unspecified: Secondary | ICD-10-CM | POA: Insufficient documentation

## 2024-08-04 DIAGNOSIS — M25571 Pain in right ankle and joints of right foot: Secondary | ICD-10-CM | POA: Insufficient documentation

## 2024-08-04 DIAGNOSIS — G8929 Other chronic pain: Secondary | ICD-10-CM | POA: Insufficient documentation

## 2024-08-04 DIAGNOSIS — M25572 Pain in left ankle and joints of left foot: Secondary | ICD-10-CM | POA: Insufficient documentation

## 2024-08-04 DIAGNOSIS — M5010 Cervical disc disorder with radiculopathy, unspecified cervical region: Secondary | ICD-10-CM | POA: Insufficient documentation

## 2024-08-04 MED ORDER — NALTREXONE HCL 50 MG PO TABS: 25.0000 mg | ORAL_TABLET | Freq: Every day | ORAL | 3 refills | Status: DC

## 2024-08-04 NOTE — Progress Notes (Signed)
 Subjective:    Patient ID: Amy Bruce, female    DOB: 03-17-1951, 73 y.o.   MRN: [id number]  HPI:  1) Right arm muscle spasms/Cervical radiculitis -she does not think the Qutenza  will help today so she defers -she is having pain in her neck that radiates up to her head -she feels like she has neuropathy, left wrist carpal tunnel -been worse -tyring to wean herself off her medications -[employer] contacted her about tens unit but she was skeptical of them, she went online and couldn't find that brand -she is doing better -she does not have the pain any more but does have the tingling and pins and needles -she still has the muscle spasms -exercises have really helped -still has neuropathy throughtout the arm and hand -muscle spasms and goes crazy -she could be sitting doing nothing and it starts to spasm -her neurosurgeon Dr. Lanis told her when he released her that she is going to be dealing with muscle spasms and headaches forever.   2) vascular headache: -has regular headaches  3) Neuropathy -extends down right arm and right leg -burns and hurts  4) Bilateral ankle pain: -hurts when she steps off a curb -her daughter visited this weekend and they went out shopping   Amy Bruce is a 73 y.o. female who returns for follow up appointment for chronic pain and medication refill. She states her pain is located in her neck radiating into her right shoulder, right arm and tingling and burning into her right hand. She rates her pain 10. Her current exercise regime is walking with cane.  Amy Bruce seen Dr. Lanis on 07/02/2022, she presented to Dr Lanis with complaints of right sided neck and arm pain. Dr Lanis recommended MRI of Cervical Spine without contrast, Amy Bruce stated she was unable to have MRI without anesthesia, due to her having a panic attack, when she tried in the past. Dr. Lanis doesn't order MRI with sedation. In the past Dr Tobie ordered Cervical  MRI WO Contrast with sedation ,on  03/02/2021. Dr Tobie is no longer a provider at our office, I spoke with Dr Onnie regarding the above, she agrees with ordering the Cervical MRI WO Contrast with sedation. Order was placed.   Dr Lanis will review the MRI, and will discuss any further treatment options from a neurosurgical standpoint.      Pain Inventory Average Pain 9 Pain Right Now 9 My pain is constant, burning, stabbing, tingling, aching, and throb  In the last 24 hours, has pain interfered with the following? General activity 9 Relation with others 8 Enjoyment of life 9 What TIME of day is your pain at its worst? morning , daytime, evening, and night Sleep (in general) Fair  Pain is worse with: walking, sitting, inactivity, standing, and some activites Pain improves with: Nothing Relief from Meds: 0  Family History  Problem Relation Age of Onset   Ovarian cancer Mother    Social History   Socioeconomic History   Marital status: Divorced    Spouse name: Not on file   Number of children: Not on file   Years of education: Not on file   Highest education level: Not on file  Occupational History   Not on file  Tobacco Use   Smoking status: Former    Current packs/day: 0.00    Average packs/day: 1 pack/day for 33.0 years (33.0 ttl pk-yrs)    Types: Cigarettes    Start date: 08/1987  Quit date: 08/2020    Years since quitting: 3.9   Smokeless tobacco: Never   Tobacco comments:    2 ciggs a day  Vaping Use   Vaping status: Never Used  Substance and Sexual Activity   Alcohol use: Not Currently    Alcohol/week: 2.0 standard drinks of alcohol    Types: 2 Shots of liquor per week    Comment: not since 2021 - 03/01/21   Drug use: No   Sexual activity: Not on file  Other Topics Concern   Not on file  Social History Narrative   Not on file   Social Drivers of Health   Financial Resource Strain: Not on file  Food Insecurity: Not on file  Transportation  Needs: Not on file  Physical Activity: Not on file  Stress: Not on file  Social Connections: Unknown (01/23/2022)   Received from [facility]   Social Network    Social Network: Not on file   Past Surgical History:  Procedure Laterality Date   ABDOMINAL AORTOGRAM W/LOWER EXTREMITY N/A 04/02/2018   Procedure: ABDOMINAL AORTOGRAM W/LOWER EXTREMITY;  Surgeon: Darron Deatrice LABOR, MD;  Location: [facility];  Service: Cardiovascular;  Laterality: N/A;   ABDOMINAL AORTOGRAM W/LOWER EXTREMITY N/A 09/23/2019   Procedure: ABDOMINAL AORTOGRAM W/LOWER EXTREMITY;  Surgeon: Darron Deatrice LABOR, MD;  Location: [facility];  Service: Cardiovascular;  Laterality: N/A;   ABDOMINAL HYSTERECTOMY  1999   CERVICAL SPINE SURGERY  8008,7979   C5/6 6/7   COLONOSCOPY     CRANIOTOMY Right 08/23/2020   Procedure: RIGHT CRANIOTOMY HEMATOMA EVACUATION SUBDURAL;  Surgeon: Lanis Pupa, MD;  Location: [facility];  Service: Neurosurgery;  Laterality: Right;   ECTOPIC PREGNANCY SURGERY     Left axillary cyst removal 1989     MULTIPLE EXTRACTIONS WITH ALVEOLOPLASTY N/A 10/25/2017   Procedure: EXTRACTION NUMBERS FIVE, SEVEN, EIGHT, NINE, TEN, FIFTEEN, EIGHTEEN WITH ALVEOLOPLASTY, IRRIGATION AND DEBRIDEMENT LEFT SUBMANDIBULAR INFECTION;  Surgeon: Sheryle Hamilton, DDS;  Location: [facility];  Service: Oral Surgery;  Laterality: N/A;   PERIPHERAL VASCULAR ATHERECTOMY Right 09/23/2019   Procedure: PERIPHERAL VASCULAR ATHERECTOMY;  Surgeon: Darron Deatrice LABOR, MD;  Location: [facility];  Service: Cardiovascular;  Laterality: Right;  SFA   PERIPHERAL VASCULAR BALLOON ANGIOPLASTY Right 09/23/2019   Procedure: PERIPHERAL VASCULAR BALLOON ANGIOPLASTY;  Surgeon: Darron Deatrice LABOR, MD;  [facility]: [facility];  Service: Cardiovascular;  Laterality: Right;  SFA   RADIOLOGY WITH ANESTHESIA N/A 11/18/2018   Procedure: MRI OF THE CERVICAL SPINE WITHOUT CONTRAST;  Surgeon: Radiologist, Medication, MD;  Location: [facility];   Service: Radiology;  Laterality: N/A;   RADIOLOGY WITH ANESTHESIA N/A 12/03/2019   Procedure: MRI WITH ANESTHESIA CERVICAL WITH AND WITHOUT CONTRAST;  Surgeon: Radiologist, Medication, MD;  Location: [facility];  Service: Radiology;  Laterality: N/A;   RADIOLOGY WITH ANESTHESIA N/A 03/02/2021   Procedure: MRI CERVICAL SPINE WITH AND WITHOUT CONTRAST;  Surgeon: Radiologist, Medication, MD;  Location: [facility];  Service: Radiology;  Laterality: N/A;   RADIOLOGY WITH ANESTHESIA N/A 09/25/2022   Procedure: MRI WITH ANESTHESIA CERVICAL SPINE WITHOUT CONTRAST;  Surgeon: Radiologist, Medication, MD;  Location: [facility];  Service: Radiology;  Laterality: N/A;   TONSILECTOMY, ADENOIDECTOMY, BILATERAL MYRINGOTOMY AND TUBES  1981   put not aware of tubes being put in ears   TUBAL LIGATION  1981   Past Surgical History:  Procedure Laterality Date   ABDOMINAL AORTOGRAM W/LOWER EXTREMITY N/A 04/02/2018   Procedure: ABDOMINAL AORTOGRAM W/LOWER EXTREMITY;  Surgeon: Darron Deatrice LABOR, MD;  Location: [facility];  Service: Cardiovascular;  Laterality: N/A;   ABDOMINAL AORTOGRAM W/LOWER EXTREMITY N/A 09/23/2019   Procedure: ABDOMINAL AORTOGRAM W/LOWER EXTREMITY;  Surgeon: Darron Deatrice LABOR, MD;  [facility];  Service: Cardiovascular;  Laterality: N/A;   ABDOMINAL HYSTERECTOMY  1999   CERVICAL SPINE SURGERY  [id number]   C5/6 6/7   COLONOSCOPY     CRANIOTOMY Right 08/23/2020   Procedure: RIGHT CRANIOTOMY HEMATOMA EVACUATION SUBDURAL;  Surgeon: Lanis Pupa, MD;  [facility];  Service: Neurosurgery;  Laterality: Right;   ECTOPIC PREGNANCY SURGERY     Left axillary cyst removal 1989     MULTIPLE EXTRACTIONS WITH ALVEOLOPLASTY N/A 10/25/2017   Procedure: EXTRACTION NUMBERS FIVE, SEVEN, EIGHT, NINE, TEN, FIFTEEN, EIGHTEEN WITH ALVEOLOPLASTY, IRRIGATION AND DEBRIDEMENT LEFT SUBMANDIBULAR INFECTION;  Surgeon: Sheryle Hamilton, DDS;  Location: [facility];  Service: Oral Surgery;  Laterality: N/A;    PERIPHERAL VASCULAR ATHERECTOMY Right 09/23/2019   Procedure: PERIPHERAL VASCULAR ATHERECTOMY;  Surgeon: Darron Deatrice LABOR, MD;  [facility]: [facility];  Service: Cardiovascular;  Laterality: Right;  SFA   PERIPHERAL VASCULAR BALLOON ANGIOPLASTY Right 09/23/2019   Procedure: PERIPHERAL VASCULAR BALLOON ANGIOPLASTY;  Surgeon: Darron Deatrice LABOR, MD;  [facility]: [facility];  Service: Cardiovascular;  Laterality: Right;  SFA   RADIOLOGY WITH ANESTHESIA N/A 11/18/2018   Procedure: MRI OF THE CERVICAL SPINE WITHOUT CONTRAST;  Surgeon: Radiologist, Medication, MD;  [facility]: [facility];  Service: Radiology;  Laterality: N/A;   RADIOLOGY WITH ANESTHESIA N/A 12/03/2019   Procedure: MRI WITH ANESTHESIA CERVICAL WITH AND WITHOUT CONTRAST;  Surgeon: Radiologist, Medication, MD;  [facility]: [facility];  Service: Radiology;  Laterality: N/A;   RADIOLOGY WITH ANESTHESIA N/A 03/02/2021   Procedure: MRI CERVICAL SPINE WITH AND WITHOUT CONTRAST;  Surgeon: Radiologist, Medication, MD;  Location: [facility];  Service: Radiology;  Laterality: N/A;   RADIOLOGY WITH ANESTHESIA N/A 09/25/2022   Procedure: MRI WITH ANESTHESIA CERVICAL SPINE WITHOUT CONTRAST;  Surgeon: Radiologist, Medication, MD;  Location: [facility];  Service: Radiology;  Laterality: N/A;   TONSILECTOMY, ADENOIDECTOMY, BILATERAL MYRINGOTOMY AND TUBES  1981   put not aware of tubes being put in ears   TUBAL LIGATION  1981   Past Medical History:  Diagnosis Date   Anemia    in the past   Anxiety    yrs. ago panic attacks   Arthritis    Cancer (HCC)    small in ovary - had total hysterestomy   Family history of adverse reaction to anesthesia    Fibromyalgia    GERD (gastroesophageal reflux disease)    GI bleed    Head injury 08/23/2020   Heart murmur    due to rheumatic fever, PCP no longer hears it   History of blood transfusion    GI Bleed   History of hiatal hernia    Hyperlipidemia    Hypertension    Myalgia    Neuropathy    PAD (peripheral  artery disease)    PAD (peripheral artery disease)    Pre-diabetes    Pyelonephritis 09/10/2019    septic   SVT (supraventricular tachycardia)    has for seconds, it does not matter what she is doing. Cardiologist is aware.   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, has to have chest xray   Vertigo    since head injury, have to be careful when walking   BP 129/71   Pulse 86  Ht 5' (1.524 m)   Wt 156 lb (70.8 kg)   SpO2 95%   BMI 30.47 kg/m   Opioid Risk Score:   Fall Risk Score:  `1  Depression screen [facility] 2/9     08/04/2024    9:22 AM 01/31/2024    9:39 AM 08/02/2023   10:04 AM 08/27/2022    8:29 AM 06/18/2022    1:38 PM 05/25/2021    8:31 AM 02/22/2021    8:26 AM  Depression screen PHQ 2/9  Decreased Interest 0 0 0 0 0 0 0  Down, Depressed, Hopeless 0 0 0 0 0 0 0  PHQ - 2 Score 0 0 0 0 0 0 0    Review of Systems  Constitutional: Negative.   HENT: Negative.    Eyes: Negative.   Respiratory: Negative.    Cardiovascular: Negative.   Gastrointestinal: Negative.   Endocrine: Negative.   Genitourinary: Negative.   Musculoskeletal:  Positive for back pain, gait problem and neck pain.       Pain all over the body  Skin: Negative.   Allergic/Immunologic: Negative.   Hematological:  Bruises/bleeds easily.       Plavix   Psychiatric/Behavioral: Negative.    All other systems reviewed and are negative.      Objective:     Gen: no distress, normal appearing HEENT: oral mucosa pink and moist, NCAT Cardio: Reg rate Chest: normal effort, normal rate of breathing Abd: soft, non-distended Ext: no edema Psych: pleasant, normal affect Skin: intact Neuro: Alert and oriented x3 Musculoskeletal: Has full range of motion of right shoulder, 5/5 strength in bilateral upper extremities, 5/5 strength in bilateral lower extremities, impaired FTN on right side, stable 11/25     Assessment & Plan:  Chronic pain 2/2 to Cervicalgia/ Cervical Radiculitis/cervicalgia: She is  scheduled for Cervical MRI WO Contrast: Dr Lanis Following.  Continue HEP as Tolerated. Continue to Monitor. Neurosurgery Following. 08/27/2022 -discussed that medications help her -continue tens unit Foods that may reduce pain: 1) Ginger (especially studied for arthritis)- reduce leukotriene production to decrease inflammation 2) Blueberries- high in phytonutrients that decrease inflammation 3) Salmon- marine omega-3s reduce joint swelling and pain 4) Pumpkin seeds- reduce inflammation 5) dark chocolate- reduces inflammation 6) turmeric- reduces inflammation 7) tart cherries - reduce pain and stiffness 8) extra virgin olive oil - its compound olecanthal helps to block prostaglandins  9) chili peppers- can be eaten or applied topically via capsaicin  10) mint- helpful for headache, muscle aches, joint pain, and itching 11) garlic- reduces inflammation 12) Green tea- reduces inflammation and oxidative stress, helps with weight loss, may reduce the risk of cancer, recommend Double Green Matcha Republic of Tea daily  Link to further information on diet for chronic pain: http://www.bray.com/   2. Lumbar Radiculitis: continue Topamax  50 mg one tablet twice a day . Continue HEP as tolerated. Continue to monitor. Neurosurgery Following. 08/27/2022 -discussed that this has been painful -Discussed Qutenza  as an option for neuropathic pain control. Discussed that this is a capsaicin  patch, stronger than capsaicin  cream. Discussed that it is currently approved for diabetic peripheral neuropathy and post-herpetic neuralgia, but that it has also shown benefit in treating other forms of neuropathy. Provided patient with link to site to learn more about the patch: https://www.qutenza .com/. Discussed that the patch would be placed in office and benefits usually last 3 months. Discussed that unintended exposure to capsaicin  can cause  severe irritation of eyes, mucous membranes, respiratory tract, and skin, but that Qutenza  is  a local treatment and does not have the systemic side effects of other nerve medications. Discussed that there may be pain, itching, erythema, and decreased sensory function associated with the application of Qutenza . Side effects usually subside within 1 week. A cold pack of analgesic medications can help with these side effects. Blood pressure can also be increased due to pain associated with administration of the patch.   4 patches of Qutenza  (35359) was applied to the area of pain. Ice packs were applied during the procedure to ensure patient comfort. Blood pressure was monitored every 15 minutes. The patient tolerated the procedure well. Post-procedure instructions were given and follow-up has been scheduled.  Topical system measures 14cm x20cm (280cm for a total 1120units) were applied which will cause deeper penetration for destruction of the peripheral nerve using a chemical (Qutenza ) which infuses into the skin like an injection and heat technique (occlusive, compressive dressing cauing endothermic heat technique)    3.Muscle Spasm: Continue current medication regimen with Cyclobenzaprine   Continue to monitor. 08/27/2022 -discussed that she has tried tizanidine  and willing to try again -discussed that this has worsened  -Discussed current symptoms of pain and history of pain.  -Discussed benefits of exercise in reducing pain. -Discussed following foods that may reduce pain: 1) Ginger (especially studied for arthritis)- reduce leukotriene production to decrease inflammation 2) Blueberries- high in phytonutrients that decrease inflammation 3) Salmon- marine omega-3s reduce joint swelling and pain 4) Pumpkin seeds- reduce inflammation 5) dark chocolate- reduces inflammation 6) turmeric- reduces inflammation 7) tart cherries - reduce pain and stiffness 8) extra virgin olive oil - its compound  olecanthal helps to block prostaglandins  9) chili peppers- can be eaten or applied topically via capsaicin  10) mint- helpful for headache, muscle aches, joint pain, and itching 11) garlic- reduces inflammation 12) Green tea- reduces inflammation and oxidative stress, helps with weight loss, may reduce the risk of cancer, recommend Double Green Matcha Republic of Tea daily  Link to further information on diet for chronic pain: http://www.bray.com/   -d/c tizandine   Prescribed Zynex Nexwave  heating/cooling blanket   Prescribing Home Zynex NexWave Stimulator Device and supplies as needed. IFC, NMES and TENS medically necessary Treatment Rx: Daily @ 30-40 minutes per treatment PRN. Z[employer]nex NexWave only, no substitutions. Treatment Goals: 1) To reduce and/or eliminate pain 2) To improve functional capacity and Activities of daily living 3) To reduce or prevent the need for oral medications 4) To improve circulation in the injured region 5) To decrease or prevent muscle spasm and muscle atrophy 6) To provide a self-management tool to the patient The patient has not sufficiently improved with conservative care. Numerous studies indexed by Medline and PubMed.gov have shown Neuromuscular, Interferential, and TENS stimulators to reduce pain, improve function, and reduce medication use in injured patients. Continued use of this evidence based, safe, drug free treatment is both reasonable and medically necessary at this time.   4. Numbness and tingling in bilateral upper extremities :No complaints today. Continue current medication regimen. Continue to monitor. 08/27/2022  -Discussed Qutenza  as an option for neuropathic pain control. Discussed that this is a capsaicin  patch, stronger than capsaicin  cream. Discussed that it is currently approved for diabetic peripheral neuropathy and post-herpetic neuralgia, but that it has also shown  benefit in treating other forms of neuropathy. Provided patient with link to site to learn more about the patch: https://www.qutenza .com/. Discussed that the patch would be placed in office and benefits usually last 3 months. Discussed that unintended exposure to  capsaicin  can cause severe irritation of eyes, mucous membranes, respiratory tract, and skin, but that Qutenza  is a local treatment and does not have the systemic side effects of other nerve medications. Discussed that there may be pain, itching, erythema, and decreased sensory function associated with the application of Qutenza . Side effects usually subside within 1 week. A cold pack of analgesic medications can help with these side effects. Blood pressure can also be increased due to pain associated with administration of the patch.  We are recommending Qutenza  8% capsaicin  to treat this patient's pain. Qutenza  is the first-line treatment option recommended for diabetic peripheral neuropathy by the AACE and ADA.   Qutenza  is a safer option for this patient due to the following: Gabapentin  use has been associated with increased risk of dementia Lyrica  use has been associated with increased risk of heart failure Cymblata, Venlafaxine, and other SSRIs/SNRIs are associated with increased weight gain Lidocaine  5% has been prescribed/tried   5.Chronic Pain Syndrome: Continue current medication regimen. Continue to monitor. 08/27/2022   6. Vascular headache: continue topamax   7. HTN: -BP is 157/82 today.  -discussed that sunlight shining on the skin can help increase NO production which helps improve blood flow throughout the body -Advised checking BP daily at home and logging results to bring into follow-up appointment with PCP and myself. -Reviewed BP meds today.  -Advised regarding healthy foods that can help lower blood pressure and provided with a list: 1) citrus foods- high in vitamins and minerals 2) salmon and other fatty fish -  reduces inflammation and oxylipins 3) swiss chard (leafy green)- high level of nitrates 4) pumpkin seeds- one of the best natural sources of magnesium  5) Beans and lentils- high in fiber, magnesium , and potassium 6) Berries- high in flavonoids 7) Amaranth (whole grain, can be cooked similarly to rice and oats)- high in magnesium  and fiber 8) Pistachios- even more effective at reducing BP than other nuts 9) Carrots- high in phenolic compounds that relax blood vessels and reduce inflammation 10) Celery- contain phthalides that relax tissues of arterial walls 11) Tomatoes- can also improve cholesterol and reduce risk of heart disease 12) Broccoli- good source of magnesium , calcium , and potassium 13) Greek yogurt: high in potassium and calcium  14) Herbs and spices: Celery seed, cilantro, saffron, lemongrass, black cumin, ginseng, cinnamon, cardamom, sweet basil, and ginger 15) Chia and flax seeds- also help to lower cholesterol and blood sugar 16) Beets- high levels of nitrates that relax blood vessels  17) spinach and bananas- high in potassium  -Provided lise of supplements that can help with hypertension:  1) magnesium : one high quality brand is Bioptemizers since it contains all 7 types of magnesium , otherwise over the counter magnesium  gluconate 400mg  is a good option 2) B vitamins 3) vitamin D 4) potassium 5) CoQ10 6) L-arginine 7) Vitamin C 8) Beetroot -Educated that goal BP is 120/80. -Made goal to incorporate some of the above foods into diet.    8. Vertigo -discussed relief with Bonine, discussed that this is an antihistamine and she can use it with the tizanidine  -discussed that she tried rehab -discussed that the tubes in her ears could be contributing to her vertigo -referred to ENT -recommended gluten removal   9) Congestion: -continue Vicks vapor rub as needed -can consider bee pollen and local honey  10) Impaired balance: -discussed that she has done PT -referred  to ENT  11) Class 1 obesity -naltrexone  prescribed for pain, discussed this can also help with weight loss -  Educated regarding health benefits of weight loss- for pain, general health, chronic disease prevention, immune health, mental health.  -Will monitor weight every visit.  -Consider Ro[place]ois tea daily.  -Discussed the benefits of intermittent fasting. -Discussed foods that can assist in weight loss: 1) leafy greens- high in fiber and nutrients 2) dark chocolate- improves metabolism (if prefer sweetened, best to sweeten with honey instead of sugar).  3) cruciferous vegetables- high in fiber and protein 4) full fat yogurt: high in healthy fat, protein, calcium , and probiotics 5) apples- high in a variety of phytochemicals 6) nuts- high in fiber and protein that increase feelings of fullness 7) grapefruit: rich in nutrients, antioxidants, and fiber (not to be taken with anticoagulation) 8) beans- high in protein and fiber 9) salmon- has high quality protein and healthy fats 10) green tea- rich in polyphenols 11) eggs- rich in choline and vitamin D 12) tuna- high protein, boosts metabolism 13) avocado- decreases visceral abdominal fat 14) chicken (pasture raised): high in protein and iron 15) blueberries- reduce abdominal fat and cholesterol 16) whole grains- decreases calories retained during digestion, speeds metabolism 17) chia seeds- curb appetite 18) chilies- increases fat metabolism  -Discussed supplements that can be used:  1) Metatrim 400mg  BID 30 minutes before breakfast and dinner  2) Sphaeranthus indicus and Garcinia mangostana (combinations of these and #1 can be found in capsicum and zychrome  3) green coffee bean extract 400mg  twice per day or Irvingia (african mango) 150 to 300mg  twice per day.   12) History of peripheral neuropathy of feet: Resolved  13) Vitamin D deficiency: -discussed that D3 2,000U daily is better absorbed than D2, continue  14)  Fibromyalgia: -discussed mechanism of action of low dose naltrexone  as an opioid receptor antagonist which stimulates your body's production of its own natural endogenous opioids, helping to decrease pain. Discussed that it can also decrease T cell response and thus be helpful in decreasing inflammation, and symptoms of brain fog, fatigue, anxiety, depression, and allergies. Discussed that this medication needs to be compounded at a compounding pharmacy and can more expensive. Discussed that I usually start at 1mg  and if this is not providing enough relief then I titrate upward on a monthly basis.    -will prescribed regular dose naltrexone  for her to break since LDN is too expensive- discussed that she has a pill cutter to break this in half  -discussed that a viral infection can exacerbate fibromylagia, recommended EZC pak  15) Bilateral ankle pain: -discussed calf raises to strengthen the ankles

## 2024-08-04 NOTE — Patient Instructions (Signed)
 Warm water with squeezed lemon and ginger

## 2024-08-05 MED ORDER — CAPSAICIN-CLEANSING GEL 8 % EX KIT
4.0000 | PACK | Freq: Once | CUTANEOUS | Status: AC
  Administered 2024-08-05: 4 via TOPICAL

## 2024-08-05 NOTE — Addendum Note (Signed)
 Addended by: Joseandres Mazer W on: 08/05/2024 11:05 AM   Modules accepted: Orders

## 2024-08-16 ENCOUNTER — Other Ambulatory Visit: Payer: Self-pay | Admitting: Physical Medicine and Rehabilitation

## 2024-08-17 ENCOUNTER — Encounter: Attending: Physical Medicine and Rehabilitation | Admitting: Physical Medicine and Rehabilitation

## 2024-08-17 DIAGNOSIS — M25571 Pain in right ankle and joints of right foot: Secondary | ICD-10-CM | POA: Insufficient documentation

## 2024-08-17 DIAGNOSIS — M25572 Pain in left ankle and joints of left foot: Secondary | ICD-10-CM | POA: Insufficient documentation

## 2024-08-17 DIAGNOSIS — M5412 Radiculopathy, cervical region: Secondary | ICD-10-CM | POA: Diagnosis not present

## 2024-08-17 DIAGNOSIS — M797 Fibromyalgia: Secondary | ICD-10-CM | POA: Insufficient documentation

## 2024-08-17 DIAGNOSIS — M792 Neuralgia and neuritis, unspecified: Secondary | ICD-10-CM | POA: Insufficient documentation

## 2024-08-17 DIAGNOSIS — M62838 Other muscle spasm: Secondary | ICD-10-CM | POA: Insufficient documentation

## 2024-08-17 DIAGNOSIS — M5416 Radiculopathy, lumbar region: Secondary | ICD-10-CM | POA: Insufficient documentation

## 2024-08-17 DIAGNOSIS — G8929 Other chronic pain: Secondary | ICD-10-CM | POA: Insufficient documentation

## 2024-08-17 MED ORDER — TIZANIDINE HCL 2 MG PO TABS: 2.0000 mg | ORAL_TABLET | Freq: Two times a day (BID) | ORAL | 3 refills | Status: AC | PRN

## 2024-08-17 NOTE — Progress Notes (Signed)
 Subjective:    Patient ID: Amy Bruce, female    DOB: 1951-06-06, 73 y.o.   MRN: [id number]  HPI:  1) Right arm muscle spasms/Cervical radiculitis -she does not think the Qutenza  will help today so she defers -she is having pain in her neck that radiates up to her head -she feels like she has neuropathy, left wrist carpal tunnel -been worse -tyring to wean herself off her medications -[employer] contacted her about tens unit but she was skeptical of them, she went online and couldn't find that brand -she is doing better -she does not have the pain any more but does have the tingling and pins and needles -she still has the muscle spasms -exercises have really helped -still has neuropathy throughtout the arm and hand -muscle spasms and goes crazy -she could be sitting doing nothing and it starts to spasm -her neurosurgeon Dr. Lanis told her when he released her that she is going to be dealing with muscle spasms and headaches forever.  -out of her flexeril   2) vascular headache: -has regular headaches  3) Neuropathy -extends down right arm and right leg -burns and hurts -she did not like the naltrexone  after reading about it  4) Bilateral ankle pain: -hurts when she steps off a curb -her daughter visited this weekend and they went out shopping   Amy Bruce is a 73 y.o. female who returns for follow up appointment for chronic pain and medication refill. She states her pain is located in her neck radiating into her right shoulder, right arm and tingling and burning into her right hand. She rates her pain 10. Her current exercise regime is walking with cane.  Ms. Lipsey seen Dr. Lanis on 07/02/2022, she presented to Dr Lanis with complaints of right sided neck and arm pain. Dr Lanis recommended MRI of Cervical Spine without contrast, Ms. Krenz stated she was unable to have MRI without anesthesia, due to her having a panic attack, when she tried in the past. Dr.  Lanis doesn't order MRI with sedation. In the past Dr Tobie ordered Cervical MRI WO Contrast with sedation ,on  03/02/2021. Dr Tobie is no longer a provider at our office, I spoke with Dr Onnie regarding the above, she agrees with ordering the Cervical MRI WO Contrast with sedation. Order was placed.   Dr Lanis will review the MRI, and will discuss any further treatment options from a neurosurgical standpoint.      Pain Inventory Average Pain 9 Pain Right Now 9 My pain is constant, burning, stabbing, tingling, aching, and throb  In the last 24 hours, has pain interfered with the following? General activity 9 Relation with others 8 Enjoyment of life 9 What TIME of day is your pain at its worst? morning , daytime, evening, and night Sleep (in general) Fair  Pain is worse with: walking, sitting, inactivity, standing, and some activites Pain improves with: Nothing Relief from Meds: 0  Family History  Problem Relation Age of Onset   Ovarian cancer Mother    Social History   Socioeconomic History   Marital status: Divorced    Spouse name: Not on file   Number of children: Not on file   Years of education: Not on file   Highest education level: Not on file  Occupational History   Not on file  Tobacco Use   Smoking status: Former    Current packs/day: 0.00    Average packs/day: 1 pack/day for 33.0 years (33.0 ttl pk-yrs)  Types: Cigarettes    Start date: 08/1987    Quit date: 08/2020    Years since quitting: 4.0   Smokeless tobacco: Never   Tobacco comments:    2 ciggs a day  Vaping Use   Vaping status: Never Used  Substance and Sexual Activity   Alcohol use: Not Currently    Alcohol/week: 2.0 standard drinks of alcohol    Types: 2 Shots of liquor per week    Comment: not since 2021 - 03/01/21   Drug use: No   Sexual activity: Not on file  Other Topics Concern   Not on file  Social History Narrative   Not on file   Social Drivers of Health    Financial Resource Strain: Not on file  Food Insecurity: Not on file  Transportation Needs: Not on file  Physical Activity: Not on file  Stress: Not on file  Social Connections: Unknown (01/23/2022)   Received from [facility]   Social Network    Social Network: Not on file   Past Surgical History:  Procedure Laterality Date   ABDOMINAL AORTOGRAM W/LOWER EXTREMITY N/A 04/02/2018   Procedure: ABDOMINAL AORTOGRAM W/LOWER EXTREMITY;  Surgeon: Darron Deatrice LABOR, MD;  Location: [facility];  Service: Cardiovascular;  Laterality: N/A;   ABDOMINAL AORTOGRAM W/LOWER EXTREMITY N/A 09/23/2019   Procedure: ABDOMINAL AORTOGRAM W/LOWER EXTREMITY;  Surgeon: Darron Deatrice LABOR, MD;  Location: [facility];  Service: Cardiovascular;  Laterality: N/A;   ABDOMINAL HYSTERECTOMY  1999   CERVICAL SPINE SURGERY  8008,7979   C5/6 6/7   COLONOSCOPY     CRANIOTOMY Right 08/23/2020   Procedure: RIGHT CRANIOTOMY HEMATOMA EVACUATION SUBDURAL;  Surgeon: Lanis Pupa, MD;  Location: [facility];  Service: Neurosurgery;  Laterality: Right;   ECTOPIC PREGNANCY SURGERY     Left axillary cyst removal 1989     MULTIPLE EXTRACTIONS WITH ALVEOLOPLASTY N/A 10/25/2017   Procedure: EXTRACTION NUMBERS FIVE, SEVEN, EIGHT, NINE, TEN, FIFTEEN, EIGHTEEN WITH ALVEOLOPLASTY, IRRIGATION AND DEBRIDEMENT LEFT SUBMANDIBULAR INFECTION;  Surgeon: Sheryle Hamilton, DDS;  Location: [facility];  Service: Oral Surgery;  Laterality: N/A;   PERIPHERAL VASCULAR ATHERECTOMY Right 09/23/2019   Procedure: PERIPHERAL VASCULAR ATHERECTOMY;  Surgeon: Darron Deatrice LABOR, MD;  [facility]: [facility];  Service: Cardiovascular;  Laterality: Right;  SFA   PERIPHERAL VASCULAR BALLOON ANGIOPLASTY Right 09/23/2019   Procedure: PERIPHERAL VASCULAR BALLOON ANGIOPLASTY;  Surgeon: Darron Deatrice LABOR, MD;  [facility];  Service: Cardiovascular;  Laterality: Right;  SFA   RADIOLOGY WITH ANESTHESIA N/A 11/18/2018   Procedure: MRI OF THE  CERVICAL SPINE WITHOUT CONTRAST;  Surgeon: Radiologist, Medication, MD;  Location: [facility];  Service: Radiology;  Laterality: N/A;   RADIOLOGY WITH ANESTHESIA N/A 12/03/2019   Procedure: MRI WITH ANESTHESIA CERVICAL WITH AND WITHOUT CONTRAST;  Surgeon: Radiologist, Medication, MD;  [facility]: [facility];  Service: Radiology;  Laterality: N/A;   RADIOLOGY WITH ANESTHESIA N/A 03/02/2021   Procedure: MRI CERVICAL SPINE WITH AND WITHOUT CONTRAST;  Surgeon: Radiologist, Medication, MD;  [facility]: [facility];  Service: Radiology;  Laterality: N/A;   RADIOLOGY WITH ANESTHESIA N/A 09/25/2022   Procedure: MRI WITH ANESTHESIA CERVICAL SPINE WITHOUT CONTRAST;  Surgeon: Radiologist, Medication, MD;  [facility]: [facility];  Service: Radiology;  Laterality: N/A;   TONSILECTOMY, ADENOIDECTOMY, BILATERAL MYRINGOTOMY AND TUBES  1981   put not aware of tubes being put in ears   TUBAL LIGATION  1981   Past Surgical History:  Procedure Laterality Date   ABDOMINAL AORTOGRAM W/LOWER  EXTREMITY N/A 04/02/2018   Procedure: ABDOMINAL AORTOGRAM W/LOWER EXTREMITY;  Surgeon: Darron Deatrice LABOR, MD;  [facility];  Service: Cardiovascular;  Laterality: N/A;   ABDOMINAL AORTOGRAM W/LOWER EXTREMITY N/A 09/23/2019   Procedure: ABDOMINAL AORTOGRAM W/LOWER EXTREMITY;  Surgeon: Darron Deatrice LABOR, MD;  [facility];  Service: Cardiovascular;  Laterality: N/A;   ABDOMINAL HYSTERECTOMY  1999   CERVICAL SPINE SURGERY  [id number]   C5/6 6/7   COLONOSCOPY     CRANIOTOMY Right 08/23/2020   Procedure: RIGHT CRANIOTOMY HEMATOMA EVACUATION SUBDURAL;  Surgeon: Lanis Pupa, MD;  [facility];  Service: Neurosurgery;  Laterality: Right;   ECTOPIC PREGNANCY SURGERY     Left axillary cyst removal 1989     MULTIPLE EXTRACTIONS WITH ALVEOLOPLASTY N/A 10/25/2017   Procedure: EXTRACTION NUMBERS FIVE, SEVEN, EIGHT, NINE, TEN, FIFTEEN, EIGHTEEN WITH ALVEOLOPLASTY, IRRIGATION AND DEBRIDEMENT LEFT SUBMANDIBULAR INFECTION;  Surgeon:  Sheryle Hamilton, DDS;  [facility];  Service: Oral Surgery;  Laterality: N/A;   PERIPHERAL VASCULAR ATHERECTOMY Right 09/23/2019   Procedure: PERIPHERAL VASCULAR ATHERECTOMY;  Surgeon: Darron Deatrice LABOR, MD;  [facility];  Service: Cardiovascular;  Laterality: Right;  SFA   PERIPHERAL VASCULAR BALLOON ANGIOPLASTY Right 09/23/2019   Procedure: PERIPHERAL VASCULAR BALLOON ANGIOPLASTY;  Surgeon: Darron Deatrice LABOR, MD;  [facility];  Service: Cardiovascular;  Laterality: Right;  SFA   RADIOLOGY WITH ANESTHESIA N/A 11/18/2018   Procedure: MRI OF THE CERVICAL SPINE WITHOUT CONTRAST;  Surgeon: Radiologist, Medication, MD;  [facility];  Service: Radiology;  Laterality: N/A;   RADIOLOGY WITH ANESTHESIA N/A 12/03/2019   Procedure: MRI WITH ANESTHESIA CERVICAL WITH AND WITHOUT CONTRAST;  Surgeon: Radiologist, Medication, MD;  [facility];  Service: Radiology;  Laterality: N/A;   RADIOLOGY WITH ANESTHESIA N/A 03/02/2021   Procedure: MRI CERVICAL SPINE WITH AND WITHOUT CONTRAST;  Surgeon: Radiologist, Medication, MD;  [facility];  Service: Radiology;  Laterality: N/A;   RADIOLOGY WITH ANESTHESIA N/A 09/25/2022   Procedure: MRI WITH ANESTHESIA CERVICAL SPINE WITHOUT CONTRAST;  Surgeon: Radiologist, Medication, MD;  [facility]: [facility];  Service: Radiology;  Laterality: N/A;   TONSILECTOMY, ADENOIDECTOMY, BILATERAL MYRINGOTOMY AND TUBES  1981   put not aware of tubes being put in ears   TUBAL LIGATION  1981   Past Medical History:  Diagnosis Date   Anemia    in the past   Anxiety    yrs. 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 Cardiologist is aware.   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  Opioid Risk Score:   Fall Risk Score:  `1  Depression screen [facility] 2/9     08/04/2024    9:22 AM 01/31/2024    9:39 AM 08/02/2023   10:04 AM 08/27/2022    8:29 AM 06/18/2022    1:38 PM 05/25/2021    8:31 AM 02/22/2021    8:26 AM  Depression screen PHQ 2/9  Decreased Interest 0 0 0 0 0 0 0  Down, Depressed, Hopeless 0 0 0 0 0 0 0  PHQ - 2 Score 0 0 0 0 0 0 0    Review of Systems  Constitutional: Negative.   HENT: Negative.    Eyes: Negative.   Respiratory: Negative.    Cardiovascular: Negative.   Gastrointestinal: Negative.   Endocrine: Negative.   Genitourinary: Negative.   Musculoskeletal:  Positive for back pain, gait problem and neck pain.       Pain all over the body  Skin: Negative.   Allergic/Immunologic: Negative.   Hematological:  Bruises/bleeds easily.       Plavix   Psychiatric/Behavioral: Negative.    All other systems reviewed and are negative.      Objective:   PRIOR EXAM: Gen: no distress, normal appearing HEENT: oral mucosa pink and moist, NCAT Cardio: Reg rate Chest: normal effort, normal rate of breathing Abd: soft, non-distended Ext: no edema Psych: pleasant, normal affect Skin: intact Neuro: Alert and oriented x3 Musculoskeletal: Has full range of motion of right shoulder, 5/5 strength in bilateral upper extremities, 5/5 strength in bilateral lower extremities, impaired FTN on right side, stable 11/25     Assessment & Plan:  Chronic pain 2/2 to Cervicalgia/  Cervical Radiculitis/cervicalgia: She is scheduled for Cervical MRI WO Contrast: Dr Lanis Following.  Continue HEP as Tolerated. Continue to Monitor. Neurosurgery Following. 08/27/2022 -discussed that medications help her -continue tens unit -discussed that tizanidine  has been working great for her -discussed that she did not like the side effect profile of naltrexone  Foods that may reduce pain: 1) Ginger (especially studied for arthritis)- reduce leukotriene production to decrease inflammation 2) Blueberries- high in phytonutrients that decrease inflammation 3) Salmon- marine omega-3s reduce joint swelling and pain 4) Pumpkin seeds- reduce inflammation 5) dark chocolate- reduces inflammation 6) turmeric- reduces inflammation 7) tart cherries - reduce pain and stiffness 8) extra virgin olive oil - its compound olecanthal helps to block prostaglandins  9) chili peppers- can be eaten or applied topically via capsaicin  10) mint- helpful for headache, muscle aches, joint pain, and itching 11) garlic- reduces inflammation 12) Green tea- reduces inflammation and oxidative stress, helps with weight loss, may reduce the risk of cancer, recommend Double Green Matcha [place] of Tea daily  Link to further information on diet for chronic pain: http://www.bray.com/   2. Lumbar Radiculitis: continue Topamax  50 mg one tablet twice a day . Continue HEP as tolerated. Continue to monitor. Neurosurgery Following. 08/27/2022 Refilled tizanidine  2mg  prn -discussed that this has been painful -Discussed Qutenza  as an option for neuropathic pain control. Discussed that this is a capsaicin  patch, stronger than capsaicin  cream. Discussed that it is currently approved for diabetic peripheral neuropathy and post-herpetic neuralgia, but that it has also shown benefit in treating other forms of neuropathy. Provided patient with link to site to  learn more about the patch: https://www.qutenza .com/. Discussed that the patch would be placed in office and benefits usually last 3 months. Discussed that unintended exposure to  capsaicin  can cause severe irritation of eyes, mucous membranes, respiratory tract, and skin, but that Qutenza  is a local treatment and does not have the systemic side effects of other nerve medications. Discussed that there may be pain, itching, erythema, and decreased sensory function associated with the application of Qutenza . Side effects usually subside within 1 week. A cold pack of analgesic medications can help with these side effects. Blood pressure can also be increased due to pain associated with administration of the patch.   4 patches of Qutenza  (35359) was applied to the area of pain. Ice packs were applied during the procedure to ensure patient comfort. Blood pressure was monitored every 15 minutes. The patient tolerated the procedure well. Post-procedure instructions were given and follow-up has been scheduled.  Topical system measures 14cm x20cm (280cm for a total 1120units) were applied which will cause deeper penetration for destruction of the peripheral nerve using a chemical (Qutenza ) which infuses into the skin like an injection and heat technique (occlusive, compressive dressing cauing endothermic heat technique)    3.Muscle Spasm:  -discussed that she had benefit from tizanidine  in the past  -Discussed current symptoms of pain and history of pain.  -Discussed benefits of exercise in reducing pain. -Discussed following foods that may reduce pain: 1) Ginger (especially studied for arthritis)- reduce leukotriene production to decrease inflammation 2) Blueberries- high in phytonutrients that decrease inflammation 3) Salmon- marine omega-3s reduce joint swelling and pain 4) Pumpkin seeds- reduce inflammation 5) dark chocolate- reduces inflammation 6) turmeric- reduces inflammation 7) tart cherries - reduce  pain and stiffness 8) extra virgin olive oil - its compound olecanthal helps to block prostaglandins  9) chili peppers- can be eaten or applied topically via capsaicin  10) mint- helpful for headache, muscle aches, joint pain, and itching 11) garlic- reduces inflammation 12) Green tea- reduces inflammation and oxidative stress, helps with weight loss, may reduce the risk of cancer, recommend Double Green Matcha Republic of Tea daily  Link to further information on diet for chronic pain: http://www.bray.com/    Prescribed Zynex Nexwave  heating/cooling blanket   Prescribing Home Zynex NexWave Stimulator Device and supplies as needed. IFC, NMES and TENS medically necessary Treatment Rx: Daily @ 30-40 minutes per treatment PRN. Zynex NexWave only, no substitutions. Treatment Goals: 1) To reduce and/or eliminate pain 2) To improve functional capacity and Activities of daily living 3) To reduce or prevent the need for oral medications 4) To improve circulation in the injured region 5) To decrease or prevent muscle spasm and muscle atrophy 6) To provide a self-management tool to the patient The patient has not sufficiently improved with conservative care. Numerous studies indexed by Medline and PubMed.gov have shown Neuromuscular, Interferential, and TENS stimulators to reduce pain, improve function, and reduce medication use in injured patients. Continued use of this evidence based, safe, drug free treatment is both reasonable and medically necessary at this time.   4. Numbness and tingling in bilateral upper extremities :No complaints today. Continue current medication regimen. Continue to monitor. 08/27/2022  -discussed that tizandine  has been helpful  -Discussed Qutenza  as an option for neuropathic pain control. Discussed that this is a capsaicin  patch, stronger than capsaicin  cream. Discussed that it is currently approved for  diabetic peripheral neuropathy and post-herpetic neuralgia, but that it has also shown benefit in treating other forms of neuropathy. Provided patient with link to site to learn more about the patch: https://www.qutenza .com/. Discussed that the patch would be placed in office and benefits usually last 3  months. Discussed that unintended exposure to capsaicin  can cause severe irritation of eyes, mucous membranes, respiratory tract, and skin, but that Qutenza  is a local treatment and does not have the systemic side effects of other nerve medications. Discussed that there may be pain, itching, erythema, and decreased sensory function associated with the application of Qutenza . Side effects usually subside within 1 week. A cold pack of analgesic medications can help with these side effects. Blood pressure can also be increased due to pain associated with administration of the patch.  We are recommending Qutenza  8% capsaicin  to treat this patient's pain. Qutenza  is the first-line treatment option recommended for diabetic peripheral neuropathy by the AACE and ADA.   Qutenza  is a safer option for this patient due to the following: Gabapentin  use has been associated with increased risk of dementia Lyrica  use has been associated with increased risk of heart failure Cymblata, Venlafaxine, and other SSRIs/SNRIs are associated with increased weight gain Lidocaine  5% has been prescribed/tried   5.Chronic Pain Syndrome: Continue current medication regimen. Continue to monitor. 08/27/2022   6. Vascular headache: continue topamax   7. HTN: -BP is 157/82 today.  -discussed that sunlight shining on the skin can help increase NO production which helps improve blood flow throughout the body -Advised checking BP daily at home and logging results to bring into follow-up appointment with PCP and myself. -Reviewed BP meds today.  -Advised regarding healthy foods that can help lower blood pressure and provided with a  list: 1) citrus foods- high in vitamins and minerals 2) salmon and other fatty fish - reduces inflammation and oxylipins 3) swiss chard (leafy green)- high level of nitrates 4) pumpkin seeds- one of the best natural sources of magnesium  5) Beans and lentils- high in fiber, magnesium , and potassium 6) Berries- high in flavonoids 7) Amaranth (whole grain, can be cooked similarly to rice and oats)- high in magnesium  and fiber 8) Pistachios- even more effective at reducing BP than other nuts 9) Carrots- high in phenolic compounds that relax blood vessels and reduce inflammation 10) Celery- contain phthalides that relax tissues of arterial walls 11) Tomatoes- can also improve cholesterol and reduce risk of heart disease 12) Broccoli- good source of magnesium , calcium , and potassium 13) Greek yogurt: high in potassium and calcium  14) Herbs and spices: Celery seed, cilantro, saffron, lemongrass, black cumin, ginseng, cinnamon, cardamom, sweet basil, and ginger 15) Chia and flax seeds- also help to lower cholesterol and blood sugar 16) Beets- high levels of nitrates that relax blood vessels  17) spinach and bananas- high in potassium  -Provided lise of supplements that can help with hypertension:  1) magnesium : one high quality brand is Bioptemizers since it contains all 7 types of magnesium , otherwise over the counter magnesium  gluconate 400mg  is a good option 2) B vitamins 3) vitamin D 4) potassium 5) CoQ10 6) L-arginine 7) Vitamin C 8) Beetroot -Educated that goal BP is 120/80. -Made goal to incorporate some of the above foods into diet.    8. Vertigo -discussed relief with Bonine, discussed that this is an antihistamine and she can use it with the tizanidine  -discussed that she tried rehab -discussed that the tubes in her ears could be contributing to her vertigo -referred to ENT -recommended gluten removal   9) Congestion: -continue Vicks vapor rub as needed -can consider bee  pollen and local honey  10) Impaired balance: -discussed that she has done PT -referred to ENT  11) Class 1 obesity -naltrexone  prescribed for pain, discussed this  can also help with weight loss -Educated regarding health benefits of weight loss- for pain, general health, chronic disease prevention, immune health, mental health.  -Will monitor weight every visit.  -Consider Ro[place]ois tea daily.  -Discussed the benefits of intermittent fasting. -Discussed foods that can assist in weight loss: 1) leafy greens- high in fiber and nutrients 2) dark chocolate- improves metabolism (if prefer sweetened, best to sweeten with honey instead of sugar).  3) cruciferous vegetables- high in fiber and protein 4) full fat yogurt: high in healthy fat, protein, calcium , and probiotics 5) apples- high in a variety of phytochemicals 6) nuts- high in fiber and protein that increase feelings of fullness 7) grapefruit: rich in nutrients, antioxidants, and fiber (not to be taken with anticoagulation) 8) beans- high in protein and fiber 9) salmon- has high quality protein and healthy fats 10) green tea- rich in polyphenols 11) eggs- rich in choline and vitamin D 12) tuna- high protein, boosts metabolism 13) avocado- decreases visceral abdominal fat 14) chicken (pasture raised): high in protein and iron 15) blueberries- reduce abdominal fat and cholesterol 16) whole grains- decreases calories retained during digestion, speeds metabolism 17) chia seeds- curb appetite 18) chilies- increases fat metabolism  -Discussed supplements that can be used:  1) Metatrim 400mg  BID 30 minutes before breakfast and dinner  2) Sphaeranthus indicus and Garcinia mangostana (combinations of these and #1 can be found in capsicum and zychrome  3) green coffee bean extract 400mg  twice per day or Irvingia (african mango) 150 to 300mg  twice per day.   12) History of peripheral neuropathy of feet: Resolved  13) Vitamin D  deficiency: -discussed that D3 2,000U daily is better absorbed than D2, continue  14) Fibromyalgia: -discussed mechanism of action of low dose naltrexone  as an opioid receptor antagonist which stimulates your body's production of its own natural endogenous opioids, helping to decrease pain. Discussed that it can also decrease T cell response and thus be helpful in decreasing inflammation, and symptoms of brain fog, fatigue, anxiety, depression, and allergies. Discussed that this medication needs to be compounded at a compounding pharmacy and can more expensive. Discussed that I usually start at 1mg  and if this is not providing enough relief then I titrate upward on a monthly basis.    -will prescribed regular dose naltrexone  for her to break since LDN is too expensive- discussed that she has a pill cutter to break this in half  -discussed that a viral infection can exacerbate fibromylagia, recommended EZC pak  -refilled tizanidine   15) Bilateral ankle pain: -discussed calf raises to strengthen the ankles -refilled tizanidine 

## 2024-09-14 ENCOUNTER — Other Ambulatory Visit: Payer: Self-pay | Admitting: Acute Care

## 2024-11-05 ENCOUNTER — Encounter: Admitting: Physical Medicine and Rehabilitation

## 2024-11-12 ENCOUNTER — Ambulatory Visit (HOSPITAL_COMMUNITY): Admit: 2024-11-12

## 2024-11-12 ENCOUNTER — Other Ambulatory Visit (HOSPITAL_COMMUNITY)

## 2024-11-12 SURGERY — MRI WITH ANESTHESIA
Anesthesia: General

## 2024-12-07 ENCOUNTER — Ambulatory Visit: Admitting: Pulmonary Disease

## 2024-12-07 ENCOUNTER — Encounter

## 2024-12-18 ENCOUNTER — Ambulatory Visit (INDEPENDENT_AMBULATORY_CARE_PROVIDER_SITE_OTHER): Admitting: Otolaryngology
